# Patient Record
Sex: Female | Born: 1960 | Race: White | Hispanic: No | Marital: Married | Smoking: Never smoker
Health system: Southern US, Community
[De-identification: ages and names within clinical notes are randomized; demographics above are authoritative.]

## PROBLEM LIST (undated history)

## (undated) DIAGNOSIS — K219 Gastro-esophageal reflux disease without esophagitis: Secondary | ICD-10-CM

## (undated) DIAGNOSIS — J45909 Unspecified asthma, uncomplicated: Secondary | ICD-10-CM

## (undated) DIAGNOSIS — R112 Nausea with vomiting, unspecified: Secondary | ICD-10-CM

## (undated) DIAGNOSIS — Z9889 Other specified postprocedural states: Secondary | ICD-10-CM

## (undated) DIAGNOSIS — J40 Bronchitis, not specified as acute or chronic: Secondary | ICD-10-CM

## (undated) DIAGNOSIS — K589 Irritable bowel syndrome without diarrhea: Secondary | ICD-10-CM

## (undated) DIAGNOSIS — T7840XA Allergy, unspecified, initial encounter: Secondary | ICD-10-CM

## (undated) DIAGNOSIS — Z800 Family history of malignant neoplasm of digestive organs: Secondary | ICD-10-CM

## (undated) DIAGNOSIS — F419 Anxiety disorder, unspecified: Secondary | ICD-10-CM

## (undated) DIAGNOSIS — F411 Generalized anxiety disorder: Secondary | ICD-10-CM

## (undated) DIAGNOSIS — Z923 Personal history of irradiation: Secondary | ICD-10-CM

## (undated) DIAGNOSIS — Z9221 Personal history of antineoplastic chemotherapy: Secondary | ICD-10-CM

## (undated) DIAGNOSIS — I10 Essential (primary) hypertension: Secondary | ICD-10-CM

## (undated) DIAGNOSIS — C50919 Malignant neoplasm of unspecified site of unspecified female breast: Secondary | ICD-10-CM

## (undated) DIAGNOSIS — G4730 Sleep apnea, unspecified: Secondary | ICD-10-CM

## (undated) DIAGNOSIS — J302 Other seasonal allergic rhinitis: Secondary | ICD-10-CM

## (undated) HISTORY — PX: FRACTURE SURGERY: SHX138

## (undated) HISTORY — DX: Generalized anxiety disorder: F41.1

## (undated) HISTORY — DX: Essential (primary) hypertension: I10

## (undated) HISTORY — PX: BREAST LUMPECTOMY: SHX2

## (undated) HISTORY — DX: Allergy, unspecified, initial encounter: T78.40XA

## (undated) HISTORY — PX: ANKLE SURGERY: SHX546

## (undated) HISTORY — PX: BREAST CYST EXCISION: SHX579

## (undated) HISTORY — DX: Irritable bowel syndrome, unspecified: K58.9

## (undated) HISTORY — PX: BREAST EXCISIONAL BIOPSY: SUR124

## (undated) HISTORY — DX: Family history of malignant neoplasm of digestive organs: Z80.0

## (undated) HISTORY — PX: EYE SURGERY: SHX253

---
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
## 2009-08-19 HISTORY — PX: TOTAL ABDOMINAL HYSTERECTOMY: SHX209

## 2009-08-19 HISTORY — PX: ABDOMINAL HYSTERECTOMY: SHX81

## 2010-08-19 HISTORY — PX: CHOLECYSTECTOMY: SHX55

## 2012-10-07 HISTORY — PX: KNEE ARTHROSCOPY W/ MEDIAL COLLATERAL LIGAMENT (MCL) REPAIR: SHX1876

## 2012-10-12 ENCOUNTER — Encounter: Payer: Self-pay | Admitting: Emergency Medicine

## 2012-10-12 ENCOUNTER — Emergency Department
Admission: EM | Admit: 2012-10-12 | Discharge: 2012-10-12 | Disposition: A | Discharge status: Home / Self Care | Payer: BC Managed Care – PPO | Source: Home / Self Care | Attending: Family Medicine | Admitting: Family Medicine

## 2012-10-12 DIAGNOSIS — J069 Acute upper respiratory infection, unspecified: Secondary | ICD-10-CM

## 2012-10-12 HISTORY — DX: Bronchitis, not specified as acute or chronic: J40

## 2012-10-12 HISTORY — DX: Essential (primary) hypertension: I10

## 2012-10-12 HISTORY — DX: Other seasonal allergic rhinitis: J30.2

## 2012-10-12 HISTORY — DX: Anxiety disorder, unspecified: F41.9

## 2012-10-12 HISTORY — DX: Unspecified asthma, uncomplicated: J45.909

## 2012-10-12 MED ORDER — BENZONATATE 200 MG PO CAPS: 200.0000 mg | ORAL_CAPSULE | Freq: Every day | ORAL | Status: DC

## 2012-10-12 MED ORDER — DOXYCYCLINE HYCLATE 100 MG PO CAPS: 100.0000 mg | ORAL_CAPSULE | Freq: Two times a day (BID) | ORAL | Status: DC

## 2012-10-12 MED ORDER — ALBUTEROL SULFATE HFA 108 (90 BASE) MCG/ACT IN AERS: 2.0000 | INHALATION_SPRAY | RESPIRATORY_TRACT | Status: DC | PRN

## 2012-10-12 NOTE — ED Provider Notes (Signed)
History     CSN: [id number]  Arrival date & time 10/12/12  1642   First MD Initiated Contact with Patient 10/12/12 1655      Chief Complaint  Patient presents with  . Cough  . Nasal Congestion       HPI Comments: Patient complains onset of URI symptoms 2 days ago consisting of sneezing, sinus congestion, sore throat, headache, cough, and fatigue.  She developed sweats last night.  Her cough has been productive and worse at night.  She has had tightness in her anterior chest.  She tried her outdated albuterol inhaler which was not helpful.  She also uses Flonase for nasal allergies.  The history is provided by the patient.    Past Medical History  Diagnosis Date  . Hypertension   . Anxiety   . Seasonal allergies   . Asthma     with severe URIs  . Bronchitis     Past Surgical History  Procedure Laterality Date  . Knee arthroscopy w/ medial collateral ligament (mcl) repair Left 10-07-2012  . Tonsillectomy    . Abdominal hysterectomy    . Cholecystectomy      Family History  Problem Relation Age of Onset  . Hypertension Mother   . Diabetes Father   . Hypertension Father   . Hypertension Sister   . Hypertension Brother     History  Substance Use Topics  . Smoking status: Never Smoker   . Smokeless tobacco: Not on file  . Alcohol Use: Yes    OB History   Grav Para Term Preterm Abortions TAB SAB Ect Mult Living                  Review of Systems + sore throat + cough No pleuritic pain, but feels tightness in anterior chest No wheezing + nasal congestion + post-nasal drainage No sinus pain/pressure No itchy/red eyes No earache No hemoptysis + SOB No fever, + sweats + nausea No vomiting No abdominal pain No diarrhea No urinary symptoms No skin rashes + fatigue No myalgias + headache Used OTC meds without relief  Allergies  Erythromycin and Penicillins  Home Medications   Current Outpatient Rx  Name  Route  Sig  Dispense  Refill  .  DULoxetine (CYMBALTA) 30 MG capsule   Oral   Take 30 mg by mouth daily.         Marland Kitchen losartan (COZAAR) 100 MG tablet   Oral   Take 100 mg by mouth daily.         Marland Kitchen albuterol (PROVENTIL HFA;VENTOLIN HFA) 108 (90 BASE) MCG/ACT inhaler   Inhalation   Inhale 2 puffs into the lungs every 4 (four) hours as needed for wheezing.   1 Inhaler   0   . benzonatate (TESSALON) 200 MG capsule   Oral   Take 1 capsule (200 mg total) by mouth at bedtime.   12 capsule   0   . doxycycline (VIBRAMYCIN) 100 MG capsule   Oral   Take 1 capsule (100 mg total) by mouth 2 (two) times daily. (Rx void after 10/20/12)   20 capsule   0     BP 157/93  Pulse 107  Temp(Src) 98 F (36.7 C) (Oral)  Ht 5\' 7"  (1.702 m)  Wt 257 lb (116.574 kg)  BMI 40.24 kg/m2  SpO2 96%  Physical Exam Nursing notes and Vital Signs reviewed. Appearance:  Patient appears stated age, and in no acute distress.  Patient is obese (BMI 40.2)  Eyes:  Pupils are equal, round, and reactive to light and accomodation.  Extraocular movement is intact.  Conjunctivae are not inflamed  Ears:  Canals normal.  Tympanic membranes normal.  Nose:  Mildly congested turbinates.  No sinus tenderness.  Pharynx:  Normal Neck:  Supple.   Tender shotty anterior/posterior nodes are palpated bilaterally  Lungs:  Clear to auscultation.  Breath sounds are equal. Chest:   Mild tenderness to palpation over the mid-sternum.   Heart:  Regular rate and rhythm without murmurs, rubs, or gallops.  Abdomen:  Nontender without masses or hepatosplenomegaly.  Bowel sounds are present.  No CVA or flank tenderness.  Extremities:  No edema.  No calf tenderness Skin:  No rash present.    ED Course  Procedures  none      1. Acute upper respiratory infections of unspecified site; suspect early viral URI       MDM  There is no evidence of bacterial infection today.   Treat symptomatically for now:  Prescription written for Benzonatate [facility]) to take at  bedtime for night-time cough.  Continue abuterol inhaler. Take plain Mucinex (guaifenesin) twice daily for cough and congestion.  Increase fluid intake, rest. May use Afrin nasal spray (or generic oxymetazoline) twice daily for about 5 days.  Also recommend using saline nasal spray several times daily and saline nasal irrigation (AYR is a common brand).  Use Flonase spray after Afrin spray and saline irrigation Stop all antihistamines for now, and other non-prescription cough/cold preparations. May take Ibuprofen 200mg , 4 tabs every 8 hours with food for chest/sternum discomfort. Begin doxycycline if not improving about 5 days or if persistent fever develops (Given a prescription to hold, with an expiration date)  Follow-up with family doctor if not improving 7 to 10 days.        Lattie Haw, MD 10/12/12 [phone]

## 2012-10-12 NOTE — ED Notes (Signed)
Patient reports cough and congestion started 3 days ago; no known fever; some shortness of breath/wheezing which historically has with URIs. Did have Flu vaccination this season. Had laproscopic left knee surgery October 07, 2012.

## 2012-11-24 ENCOUNTER — Encounter: Payer: Self-pay | Admitting: *Deleted

## 2012-11-24 ENCOUNTER — Emergency Department
Admission: EM | Admit: 2012-11-24 | Discharge: 2012-11-24 | Disposition: A | Discharge status: Home / Self Care | Payer: BC Managed Care – PPO | Source: Home / Self Care | Attending: Family Medicine | Admitting: Family Medicine

## 2012-11-24 DIAGNOSIS — I10 Essential (primary) hypertension: Secondary | ICD-10-CM

## 2012-11-24 DIAGNOSIS — Z8619 Personal history of other infectious and parasitic diseases: Secondary | ICD-10-CM

## 2012-11-24 MED ORDER — HYDROCHLOROTHIAZIDE 12.5 MG PO TABS: 12.5000 mg | ORAL_TABLET | Freq: Every day | ORAL | Status: DC

## 2012-11-24 MED ORDER — VALACYCLOVIR HCL 1 G PO TABS: ORAL_TABLET | ORAL | Status: DC

## 2012-11-24 MED ORDER — LOSARTAN POTASSIUM 100 MG PO TABS: 100.0000 mg | ORAL_TABLET | Freq: Every day | ORAL | Status: DC

## 2012-11-24 NOTE — ED Notes (Signed)
Pt c/o high blood pressure x today. She reports a BP at work of 190/120. She c/o a little dizziness. Denies SOB, Nausea and CP.

## 2012-11-24 NOTE — ED Provider Notes (Signed)
History     CSN: [id number]  Arrival date & time 11/24/12  1703   First MD Initiated Contact with Patient 11/24/12 1730      Chief Complaint  Patient presents with  . Hypertension       HPI Comments: Patient states that her BP at work today was 190/120.  She has felt slightly dizzy, but feels well otherwise.  For the past 2 to 3 years her BP has been well controlled with losartan 100mg  daily.  Over the past 6 to 7 months her BP has not been quite controlled, and is usually about 140/90 to 95. She notes that she has had increased fluid retention over the past week or two. She states that she had a left knee meniscus repair on 10/06/12 and EKG was normal at that time.  She has had a negative ETT about 2 to 3 years ago.  Her cholesterol has been controlled, and her glucose has not been elevated.  She reports weight gain over the past 5 years. Family history:  Multiple members with CV disease:  Mother and father with hypertension; paternal grandmother stroke; maternal grandmother:  Carotid artery disease; mother CHF; maternal grandfather MI. She does not smoke or use alcohol  She also requests refill of Valtrex for occasional cold sores.   Past Medical History  Diagnosis Date  . Hypertension   . Anxiety   . Seasonal allergies   . Asthma     with severe URIs  . Bronchitis     Past Surgical History  Procedure Laterality Date  . Knee arthroscopy w/ medial collateral ligament (mcl) repair Left 10-07-2012  . Tonsillectomy    . Abdominal hysterectomy    . Cholecystectomy      Family History  Problem Relation Age of Onset  . Hypertension Mother   . Diabetes Father   . Hypertension Father   . Hypertension Sister   . Hypertension Brother     History  Substance Use Topics  . Smoking status: Never Smoker   . Smokeless tobacco: Not on file  . Alcohol Use: Yes    OB History   Grav Para Term Preterm Abortions TAB SAB Ect Mult Living                  Review of Systems   Constitutional: Negative.   Eyes: Negative.   Respiratory: Negative.   Cardiovascular: Positive for leg swelling. Negative for chest pain and palpitations.  Gastrointestinal: Negative.   Endocrine: Negative.   Genitourinary: Negative.   Musculoskeletal: Negative.   Skin: Negative.   Neurological: Positive for dizziness and headaches.    Allergies  Erythromycin and Penicillins  Home Medications   Current Outpatient Rx  Name  Route  Sig  Dispense  Refill  . albuterol (PROVENTIL HFA;VENTOLIN HFA) 108 (90 BASE) MCG/ACT inhaler   Inhalation   Inhale 2 puffs into the lungs every 4 (four) hours as needed for wheezing.   1 Inhaler   0   . benzonatate (TESSALON) 200 MG capsule   Oral   Take 1 capsule (200 mg total) by mouth at bedtime.   12 capsule   0   . doxycycline (VIBRAMYCIN) 100 MG capsule   Oral   Take 1 capsule (100 mg total) by mouth 2 (two) times daily. (Rx void after 10/20/12)   20 capsule   0   . DULoxetine (CYMBALTA) 30 MG capsule   Oral   Take 30 mg by mouth daily.         Marland Kitchen  hydrochlorothiazide (HYDRODIURIL) 12.5 MG tablet   Oral   Take 1 tablet (12.5 mg total) by mouth daily.   30 tablet   0   . losartan (COZAAR) 100 MG tablet   Oral   Take 1 tablet (100 mg total) by mouth daily.   30 tablet   0   . valACYclovir (VALTREX) 1000 MG tablet      Take two tabs by mouth every 12 hours for one day.  Take at onset of cold sore   4 tablet   3     BP 169/92  Pulse 96  Temp(Src) 98.6 F (37 C) (Oral)  Wt 263 lb (119.296 kg)  BMI 41.18 kg/m2  SpO2 96%  Physical Exam Nursing notes and Vital Signs reviewed. Appearance:  Patient appears stated age, and in no acute distress.  Patient is obese (BMI 41.2) Eyes:  Pupils are equal, round, and reactive to light and accomodation.  Extraocular movement is intact.  Conjunctivae are not inflamed.  Fundi benign  Ears:  Canals normal.  Tympanic membranes normal.  Nose:   Normal turbinates Pharynx:   Normal Neck:  Supple.   No adenopathy or thyromegaly Lungs:  Clear to auscultation.  Breath sounds are equal.  Heart:  Regular rate and rhythm without murmurs, rubs, or gallops.  Abdomen:  Nontender without masses or hepatosplenomegaly.  Bowel sounds are present.  No CVA or flank tenderness.  Extremities:  No edema.  No calf tenderness Skin:  No rash present.   ED Course  Procedures  none      1. Essential hypertension, benign, decreased control   2. History of cold sores       MDM  Add HCTZ 12.5mg  each morning.  Continue Losarten. Minimize salt and sodium containing foods.  Check blood pressure daily and record. Followup with Family Doctor in one week.  Given refill of Valtrex for cold sores.        Lattie Haw, MD 11/28/12 [phone]

## 2012-11-29 ENCOUNTER — Telehealth: Payer: Self-pay | Admitting: Emergency Medicine

## 2012-11-30 ENCOUNTER — Ambulatory Visit: Payer: BC Managed Care – PPO | Admitting: Physician Assistant

## 2012-12-01 ENCOUNTER — Ambulatory Visit (INDEPENDENT_AMBULATORY_CARE_PROVIDER_SITE_OTHER): Payer: BC Managed Care – PPO | Admitting: Family Medicine

## 2012-12-01 ENCOUNTER — Encounter: Payer: Self-pay | Admitting: Family Medicine

## 2012-12-01 VITALS — BP 126/78 | HR 97 | Ht 67.0 in | Wt 262.0 lb

## 2012-12-01 DIAGNOSIS — Z800 Family history of malignant neoplasm of digestive organs: Secondary | ICD-10-CM | POA: Insufficient documentation

## 2012-12-01 DIAGNOSIS — Z9221 Personal history of antineoplastic chemotherapy: Secondary | ICD-10-CM | POA: Insufficient documentation

## 2012-12-01 DIAGNOSIS — I10 Essential (primary) hypertension: Secondary | ICD-10-CM | POA: Insufficient documentation

## 2012-12-01 DIAGNOSIS — F411 Generalized anxiety disorder: Secondary | ICD-10-CM

## 2012-12-01 DIAGNOSIS — Z13220 Encounter for screening for lipoid disorders: Secondary | ICD-10-CM

## 2012-12-01 HISTORY — DX: Generalized anxiety disorder: F41.1

## 2012-12-01 HISTORY — DX: Essential (primary) hypertension: I10

## 2012-12-01 MED ORDER — HYDROCHLOROTHIAZIDE 12.5 MG PO TABS: 12.5000 mg | ORAL_TABLET | Freq: Every day | ORAL | Status: DC

## 2012-12-01 NOTE — Progress Notes (Signed)
CC: Debbie [name] is a 52 y.o. female is here for Establish Care and Hypertension   Subjective: HPI:  Pleasant 52 year old here to establish care  Last colonoscopy 01-03-2009 without abnormality, was told to repeat 3-5 years. Grandfather diagnosed with colon cancer age 6  Last mammogram 2012/01/04 without abnormality, Pap smear same time without abnormality.  Reports a history of hypertension: Has been on losartan for years without this adjustment. Noticed blood pressures in the stage II hypertension at work and then at urgent care a few weeks ago and started on hydrochlorothiazide. Since then she denies any new side effects were suspicion of intolerance. She reports taking HCTZ 12.5 mg a daily basis along with former regimen of losartan 100 mg daily without missed doses.  Reports a history of anxiety: Has been taking Cymbalta ever since 2006/01/03 when her father died. She reports satisfactory response ever since taking this, describes as helping taking edge off anxiety in life. States that anxiety and depression nor mental disturbance is interfering with quality of life currently. Reports she does have mild anxiety spells that are worsened by job cuts at work, getting only 5 hours of sleep most nights of the week due 2 hobbies late at night. Symptoms are improved with sleep and rest and hobbies.    Review Of Systems Outlined In HPI  Past Medical History  Diagnosis Date  . Hypertension   . Anxiety   . Seasonal allergies   . Asthma     with severe URIs  . Bronchitis   . Essential hypertension, benign 12/01/2012  . Generalized anxiety disorder 12/01/2012     Family History  Problem Relation Age of Onset  . Hypertension Mother   . Diabetes Father     maternal granndmother  . Hypertension Father   . Hypertension Sister   . Hypertension Brother   . Alcoholism Father   . Colon cancer Father   . Lung cancer Father   . Depression Father   . Hyperlipidemia Maternal Grandfather   . Stroke  Paternal Grandmother      History  Substance Use Topics  . Smoking status: Never Smoker   . Smokeless tobacco: Not on file  . Alcohol Use: Yes     Objective: Filed Vitals:   12/01/12 1514  BP: 126/78  Pulse: 97   Vital signs reviewed. General: Alert and Oriented, No Acute Distress HEENT: Pupils equal, round, reactive to light. Conjunctivae clear.  External ears unremarkable.  Moist mucous membranes. Lungs: Clear and comfortable work of breathing, speaking in full sentences without accessory muscle use. No wheezing rhonchi nor rales Cardiac: Regular rate and rhythm. No murmurs or gallops Neuro: CN II-XII grossly intact, gait normal. Extremities: No peripheral edema.  Strong peripheral pulses.  Abdomen obese and soft Mental Status: No depression, anxiety, nor agitation. Logical though process. Skin: Warm and dry.  Assessment & Plan: Debbie [name] was seen today for establish care and hypertension.  Diagnoses and associated orders for this visit:  Essential hypertension, benign - TSH - BASIC METABOLIC PANEL WITH GFR - hydrochlorothiazide (HYDRODIURIL) 12.5 MG tablet; Take 1 tablet (12.5 mg total) by mouth daily.  Generalized anxiety disorder - TSH  Screening, lipid - Lipid panel  Family history of colon cancer    Essential hypertension: Controlled, continue hydrochlorothiazide and losartan. Will rule out hyperthyroidism or renal dysfunction as secondary causes. Generalized anxiety disorder: Stable and control, continue Cymbalta. We'll rule out thyroid abnormality Patient is due for screening lipid panel, will have this checked prior to  complete physical exam.  Return in about 2 weeks (around 12/15/2012) for cpe.

## 2012-12-29 ENCOUNTER — Encounter: Payer: BC Managed Care – PPO | Admitting: Family Medicine

## 2012-12-29 LAB — LIPID PANEL
Cholesterol: 172 mg/dL (ref 0–200)
LDL Cholesterol: 89 mg/dL (ref 0–99)
VLDL: 25 mg/dL (ref 0–40)

## 2012-12-29 LAB — BASIC METABOLIC PANEL WITH GFR
GFR, Est African American: >89 mL/min
GFR, Est Non African American: >89 mL/min
Potassium: 3.5 mEq/L (ref 3.5–5.3)
Sodium: 141 mEq/L (ref 135–145)

## 2012-12-29 LAB — TSH: TSH: 1.987 u[IU]/mL (ref 0.350–4.500)

## 2013-01-04 ENCOUNTER — Ambulatory Visit (INDEPENDENT_AMBULATORY_CARE_PROVIDER_SITE_OTHER): Payer: BC Managed Care – PPO | Admitting: Family Medicine

## 2013-01-04 ENCOUNTER — Encounter: Payer: Self-pay | Admitting: Family Medicine

## 2013-01-04 VITALS — BP 159/92 | HR 91 | Ht 67.0 in | Wt 266.0 lb

## 2013-01-04 DIAGNOSIS — Z8619 Personal history of other infectious and parasitic diseases: Secondary | ICD-10-CM

## 2013-01-04 DIAGNOSIS — Z90710 Acquired absence of both cervix and uterus: Secondary | ICD-10-CM

## 2013-01-04 DIAGNOSIS — Z0000 Encounter for general adult medical examination without abnormal findings: Secondary | ICD-10-CM

## 2013-01-04 DIAGNOSIS — I10 Essential (primary) hypertension: Secondary | ICD-10-CM

## 2013-01-04 DIAGNOSIS — Z1211 Encounter for screening for malignant neoplasm of colon: Secondary | ICD-10-CM

## 2013-01-04 DIAGNOSIS — Z1239 Encounter for other screening for malignant neoplasm of breast: Secondary | ICD-10-CM

## 2013-01-04 HISTORY — DX: Personal history of other infectious and parasitic diseases: Z86.19

## 2013-01-04 HISTORY — DX: Acquired absence of both cervix and uterus: Z90.710

## 2013-01-04 NOTE — Patient Instructions (Signed)
Return in 1-2 weeks if blood pressure remains above 140/90.    Dr. Genelle Bal General Advice Following Your Complete Physical Exam  The Benefits of Regular Exercise: Unless you suffer from an uncontrolled cardiovascular condition, studies strongly suggest that regular exercise and physical activity will add to both the quality and length of your life.  The World Health Organization recommends 150 minutes of moderate intensity aerobic activity every week.  This is best split over 3-4 days a week, and can be as simple as a brisk walk for just over 35 minutes "most days of the week".  This type of exercise has been shown to lower LDL-Cholesterol, lower average blood sugars, lower blood pressure, lower cardiovascular disease risk, improve memory, and increase one's overall sense of wellbeing.  The addition of anaerobic (or "strength training") exercises offers additional benefits including but not limited to increased metabolism, prevention of osteoporosis, and improved overall cholesterol levels.  How Can I Strive For A Low-Fat Diet?: Current guidelines recommend that 25-35 percent of your daily energy (food) intake should come from fats.  One might ask how can this be achieved without having to dissect each meal on a daily basis?  Switch to skim or 1% milk instead of whole milk.  Focus on lean meats such as ground [place], fresh fish, baked chicken, and lean cuts of beef as your source of dietary protein.  Consume less than 300mg /day of dietary cholesterol.  Limit trans fatty acid consumption primarily by limiting synthetic trans fats such as partially hydrogenated oils (Ex: fried fast foods).  Focus efforts on reducing your intake of "solid" fats (Ex: Butter).  Substitute olive or vegetable oil for solid fats where possible.  Moderation of Salt Intake: Provided you don't carry a diagnosis of congestive heart failure nor renal failure, I recommend a daily allowance of no more than 2300 mg of salt  (sodium).  Keeping under this daily goal is associated with a decreased risk of cardiovascular events, creeping above it can lead to elevated blood pressures and increases your risk of cardiovascular events.  Milligrams (mg) of salt is listed on all nutrition labels, and your daily intake can add up faster than you think.  Most canned and frozen dinners can pack in over half your daily salt allowance in one meal.    Lifestyle Health Risks: Certain lifestyle choices carry specific health risks.  As you may already know, tobacco use has been associated with increasing one's risk of cardiovascular disease, pulmonary disease, numerous cancers, among many other issues.  What you may not know is that there are medications and nicotine replacement strategies that can more than double your chances of successfully quitting.  I would be thrilled to help manage your quitting strategy if you currently use tobacco products.  When it comes to alcohol use, I've yet to find an "ideal" daily allowance.  Provided an individual does not have a medical condition that is exacerbated by alcohol consumption, general guidelines determine "safe drinking" as no more than two standard drinks for a man or no more than one standard drink for a female per day.  However, much debate still exists on whether any amount of alcohol consumption is technically "safe".  My general advice, keep alcohol consumption to a minimum for general health promotion.  If you or others believe that alcohol, tobacco, or recreational drug use is interfering with your life, I would be happy to provide confidential counseling regarding treatment options.  General "Over The Counter" Nutrition Advice: Postmenopausal women  should aim for a daily calcium intake of 1200 mg, however a significant portion of this might already be provided by diets including milk, yogurt, cheese, and other dairy products.  Vitamin D has been shown to help preserve bone density, prevent  fatigue, and has even been shown to help reduce falls in the elderly.  Ensuring a daily intake of 800 Units of Vitamin D is a good place to start to enjoy the above benefits, we can easily check your Vitamin D level to see if you'd potentially benefit from supplementation beyond 800 Units a day.  Folic Acid intake should be of particular concern to women of childbearing age.  Daily consumption of 400-800 mcg of Folic Acid is recommended to minimize the chance of spinal cord defects in a fetus should pregnancy occur.    For many adults, accidents still remain one of the most common culprits when it comes to cause of death.  Some of the simplest but most effective preventitive habits you can adopt include regular seatbelt use, proper helmet use, securing firearms, and regularly testing your smoke and carbon monoxide detectors.  Aeliana Spates B. [facility] DO [facility] [address], [address] [place], [place] [address] Phone: [phone]

## 2013-01-04 NOTE — Progress Notes (Signed)
CC: Debbie [name] is a 52 y.o. female is here for Annual Exam   Subjective: HPI:  Colonoscopy: Last colonoscopy 2010 without abnormality, was told to repeat 3-5 years. Grandfather diagnosed with colon cancer age 71, she will arrange this with [facility] Papsmear:Last pap 2013 without abnormality, hx of benign hysterectomy, not indicated today Mammogram: Last mammogram 2013 without abnormality, she plans to arrange this with prior breast clinic in the near future   Influenza Vaccine: not indicated 01/04/2013 Pneumovax: not indicated Td/Tdap: 10/17/2101, UTD Zoster: (Start 52 yo)  Over the past 2 weeks have you been bothered by: - Little interest or pleasure in doing things: no - Feeling down depressed or hopeless: no  No formal exercise program, admits to eating poorly and stress eating. She tells me she is motivated to try to get significant weight loss off by nonsurgical means.  Review of Systems - General ROS: negative for - chills, fever, night sweats, weight gain or weight loss Ophthalmic ROS: negative for - decreased vision Psychological ROS: negative for - anxiety or depression ENT ROS: negative for - hearing change, nasal congestion, tinnitus or allergies Hematological and Lymphatic ROS: negative for - bleeding problems, bruising or swollen lymph nodes Breast ROS: negative Respiratory ROS: no cough, shortness of breath, or wheezing Cardiovascular ROS: no chest pain or dyspnea on exertion Gastrointestinal ROS: no abdominal pain, change in bowel habits, or black or bloody stools Genito-Urinary ROS: negative for - genital discharge, genital ulcers, incontinence or abnormal bleeding from genitals Musculoskeletal ROS: negative for - joint pain or muscle pain Neurological ROS: negative for - headaches or memory loss Dermatological ROS: negative for lumps, mole changes, rash and skin lesion changes  Past Medical History  Diagnosis Date  . Hypertension   . Anxiety   . Seasonal  allergies   . Asthma     with severe URIs  . Bronchitis   . Essential hypertension, benign 12/01/2012  . Generalized anxiety disorder 12/01/2012     Family History  Problem Relation Age of Onset  . Hypertension Mother   . Diabetes Father     maternal granndmother  . Hypertension Father   . Hypertension Sister   . Hypertension Brother   . Alcoholism Father   . Colon cancer Father   . Lung cancer Father   . Depression Father   . Hyperlipidemia Maternal Grandfather   . Stroke Paternal Grandmother      History  Substance Use Topics  . Smoking status: Never Smoker   . Smokeless tobacco: Not on file  . Alcohol Use: Yes     Objective: Filed Vitals:   01/04/13 1607  BP: 159/92  Pulse: 91    General: No Acute Distress HEENT: Atraumatic, normocephalic, conjunctivae normal without scleral icterus.  No nasal discharge, hearing grossly intact, TMs with good landmarks bilaterally with no middle ear abnormalities, posterior pharynx clear without oral lesions. Neck: Supple, trachea midline, no cervical nor supraclavicular adenopathy. Pulmonary: Clear to auscultation bilaterally without wheezing, rhonchi, nor rales. Cardiac: Regular rate and rhythm.  No murmurs, rubs, nor gallops. No peripheral edema.  2+ peripheral pulses bilaterally. Abdomen: Obese Bowel sounds normal.  No masses.  Non-tender without rebound.  Negative Murphy's sign. MSK: Grossly intact, no signs of weakness.  Full strength throughout upper and lower extremities.  Full ROM in upper and lower extremities.  No midline spinal tenderness. Neuro: Gait unremarkable, CN II-XII grossly intact.  C5-C6 Reflex 2/4 Bilaterally, L4 Reflex 2/4 Bilaterally.  Cerebellar function intact. Skin: No  rashes. Nor suspicious pigmented lesions Psych: Alert and oriented to person/place/time.  Thought process normal. No anxiety/depression.   Assessment & Plan: Evonne was seen today for annual exam.  Diagnoses and associated orders for this  visit:  Annual physical exam  Essential hypertension, benign  Colon cancer screening  Screening for malignant neoplasm of breast  History of hysterectomy  History of shingles  Morbid obesity - Amb ref to Medical Nutrition Therapy-MNT    Essential hypertension: Controlled, she reports not taking blood pressure medication of the weekend. She will check blood pressures at home and return in one week if above 140/90 and has restarted daily blood pressure medication Due for colon cancer screening, she will arrange herself Due for mammogram, she will arrange herself Healthy lifestyle interventions including but limited to regular exercise, a healthy low fat diet, moderation of salt intake, the dangers of tobacco/alcohol/recreational drug use, nutrition supplementation, and accident avoidance were discussed with the patient and a handout was provided for future reference. Referral to Dr. Cathey Endow for nonsurgical bariatric management  Return in about 3 months (around 04/06/2013).

## 2013-01-28 ENCOUNTER — Other Ambulatory Visit: Payer: Self-pay | Admitting: *Deleted

## 2013-01-28 DIAGNOSIS — I10 Essential (primary) hypertension: Secondary | ICD-10-CM

## 2013-01-28 MED ORDER — HYDROCHLOROTHIAZIDE 12.5 MG PO TABS: 12.5000 mg | ORAL_TABLET | Freq: Every day | ORAL | Status: DC

## 2013-02-08 ENCOUNTER — Ambulatory Visit (INDEPENDENT_AMBULATORY_CARE_PROVIDER_SITE_OTHER): Payer: BC Managed Care – PPO | Admitting: Family Medicine

## 2013-02-08 ENCOUNTER — Encounter: Payer: Self-pay | Admitting: Family Medicine

## 2013-02-08 VITALS — BP 136/88 | HR 105 | Resp 16 | Wt 264.0 lb

## 2013-02-08 DIAGNOSIS — G4700 Insomnia, unspecified: Secondary | ICD-10-CM

## 2013-02-08 DIAGNOSIS — I10 Essential (primary) hypertension: Secondary | ICD-10-CM

## 2013-02-08 DIAGNOSIS — F411 Generalized anxiety disorder: Secondary | ICD-10-CM

## 2013-02-08 MED ORDER — ZOLPIDEM TARTRATE 5 MG PO TABS: ORAL_TABLET | ORAL | Status: DC

## 2013-02-08 NOTE — Progress Notes (Signed)
CC: Debbie [name] is a 52 y.o. female is here for Medication Problem   Subjective: HPI:  Emergency room followup  Thursday of last week she was feeling dizzy at work blood pressure response reportedly over 200/110. Medication changes include stopping Cymbalta cold [place] 2-3 days prior to this episode. She presented to [facility] emergency room complete metabolic panel is normal, CBC unremarkable, d-dimer negative, cardiac biomarkers negative, CT scan of the head revealed only right maxillary sinusitis, EKG was reportedly normal.  She was discharged that day with as needed Ativan she was using this once a day until Saturday when she restarted Cymbalta, came through mail order. During and just prior the above episodes she was reporting a sensation of hyperventilation, restlessness, constant worrying.  She denies changes in her job or relationship stressors.  Since restarting Cymbalta she reports drastic improvement of all the above symptoms.  She currently denies dizziness, headache, motor or sensory disturbances, chest pain, shortness of breath, peripheral edema, fevers, chills, mental disturbance, depression, anxiety  She has been taking losartan and hydrochlorothiazide on a daily basis without missed doses. No outside blood pressures to report other than the above  She expresses frustration with inability to sleep every night of the week. Describes it as moderate to severe trouble falling asleep without difficulty staying asleep. Symptoms were worsened during the above episode but even prior bothering her on a daily basis and her current quality of life she sleeps 5-7 hours every night. She lies in bed at 10 or 11:00 tired but will not fall sleep until 2 or 3 hours later. No help from melatonin or over-the-counter sleep aids.   Review Of Systems Outlined In HPI  Past Medical History  Diagnosis Date  . Hypertension   . Anxiety   . Seasonal allergies   . Asthma     with severe  URIs  . Bronchitis   . Essential hypertension, benign 12/01/2012  . Generalized anxiety disorder 12/01/2012     Family History  Problem Relation Age of Onset  . Hypertension Mother   . Diabetes Father     maternal granndmother  . Hypertension Father   . Hypertension Sister   . Hypertension Brother   . Alcoholism Father   . Colon cancer Father   . Lung cancer Father   . Depression Father   . Hyperlipidemia Maternal Grandfather   . Stroke Paternal Grandmother      History  Substance Use Topics  . Smoking status: Never Smoker   . Smokeless tobacco: Not on file  . Alcohol Use: Yes     Objective: Filed Vitals:   02/08/13 1119  BP: 136/88  Pulse: 105  Resp: 16    General: Alert and Oriented, No Acute Distress HEENT: Pupils equal, round, reactive to light. Conjunctivae clear.  Moist mucous membranes pharynx unremarkable Lungs: Clear to auscultation bilaterally, no wheezing/ronchi/rales.  Comfortable work of breathing. Good air movement. Cardiac: Regular rate and rhythm. Normal S1/S2.  No murmurs, rubs, nor gallops.   Abdomen: Obese soft nontender Extremities: No peripheral edema.  Strong peripheral pulses.  Mental Status: No depression, anxiety, nor agitation. Logical thought process, well-dressed Skin: Warm and dry.  Assessment & Plan: [name] was seen today for medication problem.  Diagnoses and associated orders for this visit:  Essential hypertension, benign  Generalized anxiety disorder  Insomnia - zolpidem (AMBIEN) 5 MG tablet; One to two tablets at bedtime as needed for sleep.    Essential hypertension: Controlled chronic condition, continue losartan hydrochlorothiazide  Generalized anxiety disorder: Now controlled back on Cymbalta discussed that her constellation of symptoms were almost certainly caused by immediate unintentional stop of Cymbalta Insomnia: Chronic uncontrolled condition start Ambien she has had lack of side effects been using this years ago  for jet lag  Return in about 3 months (around 05/11/2013).

## 2013-02-17 ENCOUNTER — Ambulatory Visit (INDEPENDENT_AMBULATORY_CARE_PROVIDER_SITE_OTHER): Payer: BC Managed Care – PPO | Admitting: Family Medicine

## 2013-02-17 ENCOUNTER — Encounter: Payer: Self-pay | Admitting: Family Medicine

## 2013-02-17 VITALS — BP 124/68 | HR 90 | Wt 266.0 lb

## 2013-02-17 DIAGNOSIS — K2970 Gastritis, unspecified, without bleeding: Secondary | ICD-10-CM

## 2013-02-17 HISTORY — DX: Gastritis, unspecified, without bleeding: K29.70

## 2013-02-17 MED ORDER — OMEPRAZOLE 40 MG PO CPDR: 40.0000 mg | DELAYED_RELEASE_CAPSULE | Freq: Every day | ORAL | Status: DC

## 2013-02-17 NOTE — Progress Notes (Signed)
CC: Debbie [name] is a 52 y.o. female is here for stomach issues   Subjective: HPI:  Patient complains of moderate epigastric discomfort that began 2 days ago that radiates slightly into her back that came on abruptly was improved with eating nothing made it worse was significantly improved and resolved with taking 6 generic Tums. He has not returned. She had similar pain prior to her gallbladder being taken out. Discomfort was described only as burning. Before after and during episodes she denies nausea, vomiting, right upper quadrant pain, diarrhea, constipation, exertional discomfort, jaundice. She reports she has been taking ibuprofen on a daily basis for one week up until the time of her discomfort for a root canal   Review Of Systems Outlined In HPI  Past Medical History  Diagnosis Date  . Hypertension   . Anxiety   . Seasonal allergies   . Asthma     with severe URIs  . Bronchitis   . Essential hypertension, benign 12/01/2012  . Generalized anxiety disorder 12/01/2012     Family History  Problem Relation Age of Onset  . Hypertension Mother   . Diabetes Father     maternal granndmother  . Hypertension Father   . Hypertension Sister   . Hypertension Brother   . Alcoholism Father   . Colon cancer Father   . Lung cancer Father   . Depression Father   . Hyperlipidemia Maternal Grandfather   . Stroke Paternal Grandmother      History  Substance Use Topics  . Smoking status: Never Smoker   . Smokeless tobacco: Not on file  . Alcohol Use: Yes     Objective: Filed Vitals:   02/17/13 1623  BP: 124/68  Pulse: 90    Vital signs reviewed. General: Alert and Oriented, No Acute Distress HEENT: Pupils equal, round, reactive to light. Conjunctivae clear.  External ears unremarkable.  Moist mucous membranes. Lungs: Clear and comfortable work of breathing, speaking in full sentences without accessory muscle use. Cardiac: Regular rate and rhythm.  Abdomen: Obese soft  nontender no rebound no palpable masses mild epigastric discomfort with deep palpation Extremities: No peripheral edema.  Strong peripheral pulses.  Mental Status: No depression, anxiety, nor agitation. Logical though process. Skin: Warm and dry.  Assessment & Plan: Debbie [name] was seen today for stomach issues.  Diagnoses and associated orders for this visit:  Gastritis  Other Orders - omeprazole (PRILOSEC) 40 MG capsule; Take 1 capsule (40 mg total) by mouth daily.    Discussed with patient that her epigastric pain was most likely due to gastritis and possibly GERD both of which should improve with acid suppression therapy. Discussed H2 blockers versus PPI and she has Prilosec at home that she would prefer to use I encouraged her to use this for 2-4 weeks and then stop for a trial off. Avoid nonsteroidal anti-inflammatory if possible  Return in about 3 months (around 05/20/2013).

## 2013-03-05 ENCOUNTER — Encounter: Payer: Self-pay | Admitting: Family Medicine

## 2013-03-05 DIAGNOSIS — Z6830 Body mass index (BMI) 30.0-30.9, adult: Secondary | ICD-10-CM

## 2013-03-05 DIAGNOSIS — E6609 Other obesity due to excess calories: Secondary | ICD-10-CM

## 2013-03-05 HISTORY — DX: Other obesity due to excess calories: E66.09

## 2013-05-11 ENCOUNTER — Ambulatory Visit (INDEPENDENT_AMBULATORY_CARE_PROVIDER_SITE_OTHER): Payer: BC Managed Care – PPO | Admitting: Family Medicine

## 2013-05-11 ENCOUNTER — Encounter: Payer: Self-pay | Admitting: Family Medicine

## 2013-05-11 VITALS — BP 147/82 | HR 92 | Wt 222.0 lb

## 2013-05-11 DIAGNOSIS — B009 Herpesviral infection, unspecified: Secondary | ICD-10-CM

## 2013-05-11 DIAGNOSIS — F411 Generalized anxiety disorder: Secondary | ICD-10-CM

## 2013-05-11 DIAGNOSIS — I10 Essential (primary) hypertension: Secondary | ICD-10-CM

## 2013-05-11 DIAGNOSIS — R7989 Other specified abnormal findings of blood chemistry: Secondary | ICD-10-CM

## 2013-05-11 HISTORY — DX: Other specified abnormal findings of blood chemistry: R79.89

## 2013-05-11 MED ORDER — VALACYCLOVIR HCL 1 G PO TABS: ORAL_TABLET | ORAL | Status: DC

## 2013-05-11 NOTE — Progress Notes (Signed)
CC: Debbie [name] is a 52 y.o. female is here for Hypertension   Subjective: HPI:  Followup essential hypertension: Her bariatric specialist has stopped hydrochlorothiazide. Since that time blood pressures outside her office has been consistently below 140/90. She denies headaches, motor sensory disturbances, chest pain, shortness of breath, orthopnea nor peripheral edema  Generalized anxiety disorder: She continues on Cymbalta on a daily basis without missed doses she denies any anxiety or depression in her life right now she is quite happy with her current status  Patient reports her bariatric specialist has recently found elevated LFTs. ALT is 2 times greater than AST and ALT situations, this was not noticed until July of this summer prior to that all values had been normal. She has occasional acetaminophen intake rare alcohol intake no family history of liver disease other than father with hepatitis C due to blood transfusion. She denies right upper quadrant pain skin or scleral icterus nor nausea or vomiting   Review Of Systems Outlined In HPI  Past Medical History  Diagnosis Date  . Hypertension   . Anxiety   . Seasonal allergies   . Asthma     with severe URIs  . Bronchitis   . Essential hypertension, benign 12/01/2012  . Generalized anxiety disorder 12/01/2012     Family History  Problem Relation Age of Onset  . Hypertension Mother   . Diabetes Father     maternal granndmother  . Hypertension Father   . Hypertension Sister   . Hypertension Brother   . Alcoholism Father   . Colon cancer Father   . Lung cancer Father   . Depression Father   . Hyperlipidemia Maternal Grandfather   . Stroke Paternal Grandmother      History  Substance Use Topics  . Smoking status: Never Smoker   . Smokeless tobacco: Not on file  . Alcohol Use: Yes     Objective: Filed Vitals:   05/11/13 1611  BP: 147/82  Pulse: 92    General: Alert and Oriented, No Acute  Distress HEENT: Pupils equal, round, reactive to light. Conjunctivae clear.  Moist mucous membranes pharynx unremarkable Lungs: Clear to auscultation bilaterally, no wheezing/ronchi/rales.  Comfortable work of breathing. Good air movement. Cardiac: Regular rate and rhythm. Normal S1/S2.  No murmurs, rubs, nor gallops.   Abdomen: Normal bowel sounds, soft and non tender without palpable masses. Extremities: No peripheral edema.  Strong peripheral pulses.  Mental Status: No depression, anxiety, nor agitation. Skin: Warm and dry.  Assessment & Plan: Jullian was seen today for hypertension.  Diagnoses and associated orders for this visit:  Essential hypertension, benign  Generalized anxiety disorder  Elevated LFTs - Iron and TIBC - Hepatitis panel, acute - Ferritin, Serum (Serial) - Ferritin  HSV infection - valACYclovir (VALTREX) 1000 MG tablet; Take two tabs by mouth every 12 hours for one day.  Take at onset of cold sore    Essential hypertension: Manual Recheck today shows stage I hypertension systolic. She'll be following up with her bariatric specialist twice a week for the rest of the fall, she agrees to return of blood pressure again is consistently above 140/90 at that point we will restart hydrochlorothiazide Generalized anxiety disorder: Controlled continue Cymbalta Elevated LFTs: Patient has ultrasound of the liver scheduled for Monday we will screen for viral hepatitis etiology and screen for hemachromatosis with the above labs Refill valcyclovir for as needed outbreaks HSV  Return in about 3 months (around 08/10/2013).

## 2013-05-12 LAB — FERRITIN: Ferritin: 72 ng/mL (ref 10–291)

## 2013-05-12 LAB — IRON AND TIBC
TIBC: 254 ug/dL (ref 250–470)
UIBC: 208 ug/dL (ref 125–400)

## 2013-05-13 ENCOUNTER — Encounter: Payer: Self-pay | Admitting: Family Medicine

## 2013-06-24 ENCOUNTER — Other Ambulatory Visit: Payer: Self-pay

## 2014-01-06 LAB — HM MAMMOGRAPHY

## 2014-02-22 ENCOUNTER — Other Ambulatory Visit: Payer: Self-pay

## 2014-02-22 MED ORDER — DULOXETINE HCL 30 MG PO CPEP: 30.0000 mg | ORAL_CAPSULE | Freq: Every day | ORAL | Status: DC

## 2014-02-25 ENCOUNTER — Ambulatory Visit (INDEPENDENT_AMBULATORY_CARE_PROVIDER_SITE_OTHER): Payer: BC Managed Care – PPO | Admitting: Family Medicine

## 2014-02-25 ENCOUNTER — Encounter: Payer: Self-pay | Admitting: Family Medicine

## 2014-02-25 VITALS — BP 139/81 | HR 87 | Ht 65.25 in | Wt 193.0 lb

## 2014-02-25 DIAGNOSIS — B001 Herpesviral vesicular dermatitis: Secondary | ICD-10-CM

## 2014-02-25 DIAGNOSIS — R945 Abnormal results of liver function studies: Secondary | ICD-10-CM

## 2014-02-25 DIAGNOSIS — I10 Essential (primary) hypertension: Secondary | ICD-10-CM

## 2014-02-25 DIAGNOSIS — J4520 Mild intermittent asthma, uncomplicated: Secondary | ICD-10-CM

## 2014-02-25 DIAGNOSIS — F411 Generalized anxiety disorder: Secondary | ICD-10-CM

## 2014-02-25 DIAGNOSIS — B009 Herpesviral infection, unspecified: Secondary | ICD-10-CM

## 2014-02-25 DIAGNOSIS — J45909 Unspecified asthma, uncomplicated: Secondary | ICD-10-CM

## 2014-02-25 DIAGNOSIS — G4700 Insomnia, unspecified: Secondary | ICD-10-CM

## 2014-02-25 DIAGNOSIS — R7989 Other specified abnormal findings of blood chemistry: Secondary | ICD-10-CM

## 2014-02-25 MED ORDER — ALBUTEROL SULFATE HFA 108 (90 BASE) MCG/ACT IN AERS: 2.0000 | INHALATION_SPRAY | RESPIRATORY_TRACT | Status: DC | PRN

## 2014-02-25 MED ORDER — ZOLPIDEM TARTRATE 5 MG PO TABS: 5.0000 mg | ORAL_TABLET | Freq: Every evening | ORAL | Status: DC | PRN

## 2014-02-25 MED ORDER — VALACYCLOVIR HCL 500 MG PO TABS: ORAL_TABLET | ORAL | Status: DC

## 2014-02-25 MED ORDER — DULOXETINE HCL 30 MG PO CPEP: 30.0000 mg | ORAL_CAPSULE | Freq: Every day | ORAL | Status: DC

## 2014-02-25 NOTE — Progress Notes (Signed)
CC: Debbie [name] is a 53 y.o. female is here for Hypertension   Subjective: HPI:  Followup essential hypertension: Continues to take Cozaar on a daily basis without known intolerances. Renal function was checked most recently in Spring through the [place] system and normal. She works out about an hour 6 days a week, she's been having continued success with weight loss focusing on exercise and portion control. Denies chest pain shortness of breath orthopnea nor peripheral edema  Generalized anxiety disorder: Symptoms of nervousness and difficult sleeping have returned after she stopped taking Cymbalta which she ran out of it 10 days ago. Prior to running out of medication she states that she was completely anxiety and free of any other mental disturbance. Symptoms are mild to moderate in severity currently.  She at one time took Ambien for difficulty falling asleep and wants to know she can have her refill until Cymbalta takes full effect again. Describes difficulty sleeping as racing thoughts of what ever she had to deal with at work during that day but no reoccurring thoughts or paranoia.  She's requesting a refill of albuterol that she uses during the fall due to reactive airway disease. Currently she denies any shortness of breath wheezing or cough  Requesting refills on projects that she uses for cold sores. Currently she does not have any lesions that she's concerned about. Symptoms will resolve within one or 2 days if she takes it within the first 24 hours of onset.  Since I saw her last liver biopsy for elevated LFTs which did not show steatosis or any signs of autoimmune disease, essential biopsy was normal.     Review Of Systems Outlined In HPI  Past Medical History  Diagnosis Date  . Hypertension   . Anxiety   . Seasonal allergies   . Asthma     with severe URIs  . Bronchitis   . Essential hypertension, benign 12/01/2012  . Generalized anxiety disorder 12/01/2012    Past  Surgical History  Procedure Laterality Date  . Knee arthroscopy w/ medial collateral ligament (mcl) repair Left 10-07-2012  . Tonsillectomy  1969  . Abdominal hysterectomy  2011  . Cholecystectomy  2012  . Ankle surgery     Family History  Problem Relation Age of Onset  . Hypertension Mother   . Diabetes Father     maternal granndmother  . Hypertension Father   . Hypertension Sister   . Hypertension Brother   . Alcoholism Father   . Colon cancer Father   . Lung cancer Father   . Depression Father   . Hyperlipidemia Maternal Grandfather   . Stroke Paternal Grandmother     History   Social History  . Marital Status: Married    Spouse Name: N/A    Number of Children: N/A  . Years of Education: N/A   Occupational History  . Not on file.   Social History Main Topics  . Smoking status: Never Smoker   . Smokeless tobacco: Not on file  . Alcohol Use: Yes  . Drug Use: No  . Sexual Activity: Not Currently   Other Topics Concern  . Not on file   Social History Narrative  . No narrative on file     Objective: BP 139/81  Pulse 87  Ht 5' 5.25" (1.657 m)  Wt 193 lb (87.544 kg)  BMI 31.88 kg/m2  General: Alert and Oriented, No Acute Distress HEENT: Pupils equal, round, reactive to light. Conjunctivae clear.  Moist mucous membranes  pharynx unremarkable Lungs: Clear to auscultation bilaterally, no wheezing/ronchi/rales.  Comfortable work of breathing. Good air movement. Cardiac: Regular rate and rhythm. Normal S1/S2.  No murmurs, rubs, nor gallops.   Abdomen: Obese soft nontender Extremities: No peripheral edema.  Strong peripheral pulses.  Mental Status: No depression, anxiety, nor agitation. Skin: Warm and dry.  Assessment & Plan: Debbie [name] was seen today for hypertension.  Diagnoses and associated orders for this visit:  Essential hypertension, benign  Generalized anxiety disorder  Insomnia - zolpidem (AMBIEN) 5 MG tablet; Take 1 tablet (5 mg total) by mouth at  bedtime as needed for sleep.  Reactive airway disease, mild intermittent, uncomplicated - albuterol (PROVENTIL HFA;VENTOLIN HFA) 108 (90 BASE) MCG/ACT inhaler; Inhale 2 puffs into the lungs every 4 (four) hours as needed for wheezing.  Cold sore - valACYclovir (VALTREX) 500 MG tablet; One by mouth twice a day for 1-2 days at onset of cold sore.  Elevated LFTs  Other Orders - DULoxetine (CYMBALTA) 30 MG capsule; Take 1 capsule (30 mg total) by mouth daily.    Essential hypertension: Controlled continue success with weight loss and continue Cozaar Generalized anxiety disorder: Uncontrolled due to stopping Cymbalta restart former regimen and return in 1-2 weeks after starting if symptoms are persistent Insomnia: Most likely due to stopping Cymbalta, restart Ambien pending return of Cymbalta effectiveness Cold sore: Continue as needed Valtrex Reactive airway disease: Controlled continue as needed albuterol Elevated of her function test: No further intervention unless she develops further quadrant pain, fevers, and/or jaundice/icterus  Return in about 6 months (around 08/28/2014).

## 2014-06-10 ENCOUNTER — Telehealth: Payer: Self-pay | Admitting: Family Medicine

## 2014-06-10 MED ORDER — LOSARTAN POTASSIUM 100 MG PO TABS: 100.0000 mg | ORAL_TABLET | Freq: Every day | ORAL | Status: DC

## 2014-06-10 NOTE — Telephone Encounter (Signed)
[facility]

## 2014-08-29 ENCOUNTER — Encounter: Payer: Self-pay | Admitting: Family Medicine

## 2014-08-29 ENCOUNTER — Ambulatory Visit (INDEPENDENT_AMBULATORY_CARE_PROVIDER_SITE_OTHER): Payer: BLUE CROSS/BLUE SHIELD | Admitting: Family Medicine

## 2014-08-29 VITALS — BP 145/87 | HR 85 | Wt 207.0 lb

## 2014-08-29 DIAGNOSIS — I10 Essential (primary) hypertension: Secondary | ICD-10-CM

## 2014-08-29 DIAGNOSIS — Z800 Family history of malignant neoplasm of digestive organs: Secondary | ICD-10-CM

## 2014-08-29 DIAGNOSIS — S43431A Superior glenoid labrum lesion of right shoulder, initial encounter: Secondary | ICD-10-CM | POA: Diagnosis not present

## 2014-08-29 DIAGNOSIS — S43439A Superior glenoid labrum lesion of unspecified shoulder, initial encounter: Secondary | ICD-10-CM

## 2014-08-29 DIAGNOSIS — F411 Generalized anxiety disorder: Secondary | ICD-10-CM | POA: Diagnosis not present

## 2014-08-29 HISTORY — DX: Superior glenoid labrum lesion of unspecified shoulder, initial encounter: S43.439A

## 2014-08-29 MED ORDER — LOSARTAN POTASSIUM 100 MG PO TABS: 100.0000 mg | ORAL_TABLET | Freq: Every day | ORAL | Status: DC

## 2014-08-29 MED ORDER — DULOXETINE HCL 20 MG PO CPEP: 20.0000 mg | ORAL_CAPSULE | Freq: Every day | ORAL | Status: DC

## 2014-08-29 NOTE — Progress Notes (Signed)
CC: Debbie [name] is a 54 y.o. female is here for Hypertension   Subjective: HPI:  Follow-up essential hypertension: Continues on losartan on a daily basis. No outside blood pressures to report however on chart review through Dr. Cruzita Lederer office pressures have been ranging from low stage I to prehypertensive state. Denies chest pain shortness of breath orthopnea nor peripheral edema. 6 days ago she restarted an exercise routine that she had stopped for at least 2 months.  Follow-up family history colon cancer: She forgot to schedule a repeat colonoscopy in 2015.  Follow-up generalized anxiety disorder: She wants to know if she can try to get off citalopram. The last time she did this she stopped cold [place] when she ran out of medication and had worsening anxiety. She states right now that anxiety is well controlled and has no other mental disturbance.  She complains of right shoulder pain localized in the posterior aspect of the shoulder that is nonradiating. It is worse with lying on the right shoulder with her arm abducted.  It occurred after a new weight lifting regimen that she has now abandoned. It is worse doing curls of any weight. She denies any motor or sensory disturbances in the upper extremity other than that above. She denies any swelling redness or warmth of the joint     Review Of Systems Outlined In HPI  Past Medical History  Diagnosis Date  . Hypertension   . Anxiety   . Seasonal allergies   . Asthma     with severe URIs  . Bronchitis   . Essential hypertension, benign 12/01/2012  . Generalized anxiety disorder 12/01/2012    Past Surgical History  Procedure Laterality Date  . Knee arthroscopy w/ medial collateral ligament (mcl) repair Left 10-07-2012  . Tonsillectomy  1969  . Abdominal hysterectomy  2011  . Cholecystectomy  2012  . Ankle surgery     Family History  Problem Relation Age of Onset  . Hypertension Mother   . Diabetes Father     maternal  granndmother  . Hypertension Father   . Hypertension Sister   . Hypertension Brother   . Alcoholism Father   . Colon cancer Father   . Lung cancer Father   . Depression Father   . Hyperlipidemia Maternal Grandfather   . Stroke Paternal Grandmother     History   Social History  . Marital Status: Married    Spouse Name: N/A    Number of Children: N/A  . Years of Education: N/A   Occupational History  . Not on file.   Social History Main Topics  . Smoking status: Never Smoker   . Smokeless tobacco: Not on file  . Alcohol Use: Yes  . Drug Use: No  . Sexual Activity: Not Currently   Other Topics Concern  . Not on file   Social History Narrative     Objective: BP 145/87 mmHg  Pulse 85  Wt 207 lb (93.895 kg)  General: Alert and Oriented, No Acute Distress HEENT: Pupils equal, round, reactive to light. Conjunctivae clear.  Moist mucous membranes pharynx unremarkable Lungs: clearing comfortable work of breathing Cardiac: Regular rate and rhythm.  Right shoulder exam reveals full range of motion and strength in all planes of motion and with individual rotator cuff testing. No overlying redness warmth or swelling.  Neer's test negative.  Hawkins test negative. Empty can negative. Crossarm test positive. O'Brien's test positive. Apprehension test negative. Speed's test negative. Extremities: No peripheral edema.  Strong  peripheral pulses.  Mental Status: No depression, anxiety, nor agitation. Skin: Warm and dry.  Assessment & Plan: Debbie [name] was seen today for hypertension.  Diagnoses and associated orders for this visit:  Essential hypertension, benign - losartan (COZAAR) 100 MG tablet; Take 1 tablet (100 mg total) by mouth daily.  Family history of colon cancer  Generalized anxiety disorder - DULoxetine (CYMBALTA) 20 MG capsule; Take 1 capsule (20 mg total) by mouth daily.  Labral tear of shoulder, right, initial encounter    Essential hypertension: Uncontrolled  chronic condition, continue losartan she and I are both optimistic that she'll be able to stick with her daily exercise routine to help her lose weight and also reduce her blood pressure therefore no interventions unless blood pressures remain above 140/90 outside of our office for the next month. Family history colon cancer: I have asked her if she needs any help with arranging a repeat colonoscopy she politely declines and states that she already has the contact info for her gastroenterologist and plans on calling them shortly. Generalized anxiety disorder: Controlled discussed tapering off of Cymbalta beginning at 20 mg in going from there based on symptoms Discussed with her my suspicion of a labral tear of the right shoulder rotator cuff tendinitis, she was given a home rehabilitation exercise plan to be done on a daily basis for the next month. If no better as we get into February follow-up with a sports medicine consult for second opinion.  Return in about 3 months (around 11/28/2014) for Blood Pressure Follow Up.

## 2014-10-12 ENCOUNTER — Other Ambulatory Visit: Payer: Self-pay

## 2014-10-12 DIAGNOSIS — F411 Generalized anxiety disorder: Secondary | ICD-10-CM

## 2014-10-12 MED ORDER — DULOXETINE HCL 20 MG PO CPEP: 20.0000 mg | ORAL_CAPSULE | Freq: Every day | ORAL | Status: DC

## 2014-11-28 ENCOUNTER — Ambulatory Visit (INDEPENDENT_AMBULATORY_CARE_PROVIDER_SITE_OTHER): Payer: BLUE CROSS/BLUE SHIELD | Admitting: Family Medicine

## 2014-11-28 ENCOUNTER — Encounter: Payer: Self-pay | Admitting: Family Medicine

## 2014-11-28 VITALS — BP 140/90 | HR 81 | Wt 206.0 lb

## 2014-11-28 DIAGNOSIS — F411 Generalized anxiety disorder: Secondary | ICD-10-CM

## 2014-11-28 DIAGNOSIS — S43431A Superior glenoid labrum lesion of right shoulder, initial encounter: Secondary | ICD-10-CM | POA: Diagnosis not present

## 2014-11-28 DIAGNOSIS — I10 Essential (primary) hypertension: Secondary | ICD-10-CM | POA: Diagnosis not present

## 2014-11-28 DIAGNOSIS — L309 Dermatitis, unspecified: Secondary | ICD-10-CM

## 2014-11-28 MED ORDER — DESONIDE 0.05 % EX CREA: TOPICAL_CREAM | Freq: Two times a day (BID) | CUTANEOUS | Status: DC

## 2014-11-28 NOTE — Progress Notes (Signed)
CC: Debbie [name] is a 54 y.o. female is here for Hypertension   Subjective: HPI:  Follow-up essential hypertension: Continues to take losartan 100 mg on a daily basis. Blood pressures outside of our office have been in the prehypertensive range. She denies any known side effects. No formal exercise routine but has started saxenda 5 weeks ago for weight loss. She is not sure if it's helping yet. Denies chest pain shortness of breath orthopnea nor peripheral edema  Follow-up anxiety: She stopped taking Cymbalta 1 week ago. She had some hyper sensation that lasted for a day on Wednesday but she is back to her normal state of health and denies any anxiety or irritability depression or any mental disturbance.  Follow-up right shoulder pain: She tells me that she is almost 100% better and without pain in her right shoulder. It will return if she sleeps on her right shoulder but pain is not interfering with quality of life. Symptoms got better after doing home rehabilitative exercises  She is a rash on both cheeks that has been present for the past few weeks. It has not improved with vitamin E moisturizer. No other interventions. It's itchy she's never had this before and denies skin changes elsewhere.   Review Of Systems Outlined In HPI  Past Medical History  Diagnosis Date  . Hypertension   . Anxiety   . Seasonal allergies   . Asthma     with severe URIs  . Bronchitis   . Essential hypertension, benign 12/01/2012  . Generalized anxiety disorder 12/01/2012    Past Surgical History  Procedure Laterality Date  . Knee arthroscopy w/ medial collateral ligament (mcl) repair Left 10-07-2012  . Tonsillectomy  1969  . Abdominal hysterectomy  2011  . Cholecystectomy  2012  . Ankle surgery     Family History  Problem Relation Age of Onset  . Hypertension Mother   . Diabetes Father     maternal granndmother  . Hypertension Father   . Hypertension Sister   . Hypertension Brother   .  Alcoholism Father   . Colon cancer Father   . Lung cancer Father   . Depression Father   . Hyperlipidemia Maternal Grandfather   . Stroke Paternal Grandmother     History   Social History  . Marital Status: Married    Spouse Name: N/A  . Number of Children: N/A  . Years of Education: N/A   Occupational History  . Not on file.   Social History Main Topics  . Smoking status: Never Smoker   . Smokeless tobacco: Not on file  . Alcohol Use: Yes  . Drug Use: No  . Sexual Activity: Not Currently   Other Topics Concern  . Not on file   Social History Narrative     Objective: BP 140/90 mmHg  Pulse 81  Wt 206 lb (93.441 kg)  General: Alert and Oriented, No Acute Distress HEENT: Pupils equal, round, reactive to light. Conjunctivae clear.  Moist mucous membranes Lungs: Clear to auscultation bilaterally, no wheezing/ronchi/rales.  Comfortable work of breathing. Good air movement. Cardiac: Regular rate and rhythm. Normal S1/S2.  No murmurs, rubs, nor gallops.   Extremities: No peripheral edema.  Strong peripheral pulses.  Mental Status: No depression, anxiety, nor agitation. Skin: Warm and dry. Mild eczematous changes on the bilateral cheeks around the jawline  Assessment & Plan: Acelyn was seen today for hypertension.  Diagnoses and all orders for this visit:  Essential hypertension, benign  Generalized anxiety disorder  Labral tear of shoulder, right, initial encounter  Eczema  Other orders -     desonide (DESOWEN) 0.05 % cream; Apply topically 2 (two) times daily. Use for up to two weeks.   Essential hypertension: Borderline control, continue losartan and stressed the importance of exercise to help with weight loss which will ultimately reduce blood pressure Anxiety: Controlled without any Cymbalta, no further intervention Suspected right labral tear of the shoulder: Resolved and stable Eczema: Start low-dose desonide for no more than 2 weeks   Return in about  3 months (around 02/27/2015) for BP Follow Up.

## 2015-02-27 ENCOUNTER — Encounter: Payer: Self-pay | Admitting: Family Medicine

## 2015-02-27 ENCOUNTER — Ambulatory Visit (INDEPENDENT_AMBULATORY_CARE_PROVIDER_SITE_OTHER): Payer: BLUE CROSS/BLUE SHIELD | Admitting: Family Medicine

## 2015-02-27 VITALS — BP 165/102 | HR 92 | Wt 204.0 lb

## 2015-02-27 DIAGNOSIS — L309 Dermatitis, unspecified: Secondary | ICD-10-CM | POA: Diagnosis not present

## 2015-02-27 DIAGNOSIS — M25562 Pain in left knee: Secondary | ICD-10-CM

## 2015-02-27 DIAGNOSIS — I10 Essential (primary) hypertension: Secondary | ICD-10-CM | POA: Diagnosis not present

## 2015-02-27 DIAGNOSIS — M25561 Pain in right knee: Secondary | ICD-10-CM | POA: Diagnosis not present

## 2015-02-27 MED ORDER — DESONIDE 0.05 % EX CREA: TOPICAL_CREAM | Freq: Two times a day (BID) | CUTANEOUS | Status: DC

## 2015-02-27 NOTE — Progress Notes (Signed)
CC: Debbie [name] is a 54 y.o. female is here for Hypertension   Subjective: HPI:  Follow-up of central hypertension: Continues to take losartan on daily basis. No outside blood pressures to report at home however blood pressure was in prehypertensive stat  last month at her bariatric clinic. She is adamant that her blood pressure today is only elevated because she had a uncharacteristically stressful day. Denies chest pain shortness of breath orthopnea nor peripheral edema  She's had the return of a red slightly itchy rash on her cheeks. Symptoms subsided after a few days of using desonide. She's not use this again since the symptoms restarted a few days ago. She is fearful that she is not allowed to use it since she had already used it for 2 weeks 3 months ago. He denies skin changes elsewhere. No fevers or chills   complains of bilateral knee pain described as "stiffness and cotton sensation in my joints"  that is present only after periods of inactivity. It is improved with the more activity that she engages in such as walking or doing strength training. There's been no swelling locking giving way or any other mechanical symptoms. She denies any redness swelling or warmth of either knee. It's been present on a daily basis to mild degree for at least a few months now. No interventions as of yet other than working out.    Review Of Systems Outlined In HPI  Past Medical History  Diagnosis Date  . Hypertension   . Anxiety   . Seasonal allergies   . Asthma     with severe URIs  . Bronchitis   . Essential hypertension, benign 12/01/2012  . Generalized anxiety disorder 12/01/2012    Past Surgical History  Procedure Laterality Date  . Knee arthroscopy w/ medial collateral ligament (mcl) repair Left 10-07-2012  . Tonsillectomy  1969  . Abdominal hysterectomy  2011  . Cholecystectomy  2012  . Ankle surgery     Family History  Problem Relation Age of Onset  . Hypertension Mother   .  Diabetes Father     maternal granndmother  . Hypertension Father   . Hypertension Sister   . Hypertension Brother   . Alcoholism Father   . Colon cancer Father   . Lung cancer Father   . Depression Father   . Hyperlipidemia Maternal Grandfather   . Stroke Paternal Grandmother     History   Social History  . Marital Status: Married    Spouse Name: N/A  . Number of Children: N/A  . Years of Education: N/A   Occupational History  . Not on file.   Social History Main Topics  . Smoking status: Never Smoker   . Smokeless tobacco: Not on file  . Alcohol Use: Yes  . Drug Use: No  . Sexual Activity: Not Currently   Other Topics Concern  . Not on file   Social History Narrative     Objective: BP 165/102 mmHg  Pulse 92  Wt 204 lb (92.534 kg)  General: Alert and Oriented, No Acute Distress HEENT: Pupils equal, round, reactive to light. Conjunctivae clear. Moist mucous membranes  Lungs: Clear to auscultation bilaterally, no wheezing/ronchi/rales.  Comfortable work of breathing. Good air movement. Cardiac: Regular rate and rhythm. Normal S1/S2.  No murmurs, rubs, nor gallops.   Extremities: No peripheral edema.  Strong peripheral pulses. Full range of motion and strength of both lower extremities without any swelling redness or warmth of either knee.  Mental  Status: No depression, anxiety, nor agitation. Skin: Warm and dry.Mild eczematous changes on both lower jawline  Assessment & Plan: Zyra was seen today for hypertension.  Diagnoses and all orders for this visit:  Essential hypertension, benign  Bilateral knee pain  Eczema Orders: -     desonide (DESOWEN) 0.05 % cream; Apply topically 2 (two) times daily. Use for up to two weeks.   Essential hypertension: Uncontrolled chronic condition, given that she is adamant that her blood pressure is elevated only because of a uncharacteristically stressful day today we will test this belief by having her check her blood  pressure every morning for the next week and drop it off for my review. She may need to start on losartan-hydrochlorothiazide. Bilateral knee pain: History highly suspicious for osteoarthritis she was given a handout of home rehabilitation exercises to do on a daily basis for the next month. Eczema: Discussed she can restart desonide for another 2 weeks and then as needed.   Return in about 3 months (around 05/30/2015).

## 2015-02-27 NOTE — Patient Instructions (Signed)
Write daily BP values down below and drop off after one week:                  .

## 2015-03-03 LAB — LIPID PANEL
CHOLESTEROL: 145 mg/dL (ref 0–200)
HDL: 72 mg/dL — AB (ref 35–70)

## 2015-03-03 LAB — BASIC METABOLIC PANEL: GLUCOSE: 71 mg/dL

## 2015-03-04 LAB — HM MAMMOGRAPHY

## 2015-03-07 ENCOUNTER — Telehealth: Payer: Self-pay | Admitting: Family Medicine

## 2015-03-07 DIAGNOSIS — I10 Essential (primary) hypertension: Secondary | ICD-10-CM

## 2015-03-07 MED ORDER — LOSARTAN POTASSIUM-HCTZ 100-25 MG PO TABS: 1.0000 | ORAL_TABLET | Freq: Every day | ORAL | Status: DC

## 2015-03-07 NOTE — Telephone Encounter (Signed)
Seth Bake, Will you please thank patient for dropping off her BP log.  I'd recommend starting a different formulation of losartan that also contains hydrochlorothiazide, another ingredient to lower blood pressure.  Can she then f/u with me in one month after this switch.  Stop the old form of losartan.

## 2015-03-08 NOTE — Telephone Encounter (Signed)
Pt notified and transferred up front to schedule an appt

## 2015-03-14 ENCOUNTER — Encounter: Payer: Self-pay | Admitting: Family Medicine

## 2015-03-15 ENCOUNTER — Encounter: Payer: Self-pay | Admitting: *Deleted

## 2015-03-22 ENCOUNTER — Encounter: Payer: Self-pay | Admitting: *Deleted

## 2015-04-10 ENCOUNTER — Ambulatory Visit (INDEPENDENT_AMBULATORY_CARE_PROVIDER_SITE_OTHER): Payer: BLUE CROSS/BLUE SHIELD | Admitting: Family Medicine

## 2015-04-10 ENCOUNTER — Encounter: Payer: Self-pay | Admitting: Family Medicine

## 2015-04-10 VITALS — BP 128/84 | HR 72 | Wt 202.0 lb

## 2015-04-10 DIAGNOSIS — I10 Essential (primary) hypertension: Secondary | ICD-10-CM

## 2015-04-10 MED ORDER — LOSARTAN POTASSIUM-HCTZ 100-25 MG PO TABS: 1.0000 | ORAL_TABLET | Freq: Every day | ORAL | Status: DC

## 2015-04-10 NOTE — Progress Notes (Signed)
CC: Debbie [name] is a 54 y.o. female is here for Hypertension   Subjective: HPI:  Follow essential hypertension: Since I saw her last she has been tolerating losartan-hydrochlorothiazide she had her blood pressure checked last week at a separate office and it was 107/70. She tells me she feels fine and has had no known side effects. She denies lightheadedness, chest pain, shortness of breath, orthopnea nor peripheral edema. she's noticed she is urinating a few more times during the day but symptoms are not interfering with quality of life.   Review Of Systems Outlined In HPI  Past Medical History  Diagnosis Date  . Hypertension   . Anxiety   . Seasonal allergies   . Asthma     with severe URIs  . Bronchitis   . Essential hypertension, benign 12/01/2012  . Generalized anxiety disorder 12/01/2012    Past Surgical History  Procedure Laterality Date  . Knee arthroscopy w/ medial collateral ligament (mcl) repair Left 10-07-2012  . Tonsillectomy  1969  . Abdominal hysterectomy  2011  . Cholecystectomy  2012  . Ankle surgery     Family History  Problem Relation Age of Onset  . Hypertension Mother   . Diabetes Father     maternal granndmother  . Hypertension Father   . Hypertension Sister   . Hypertension Brother   . Alcoholism Father   . Colon cancer Father   . Lung cancer Father   . Depression Father   . Hyperlipidemia Maternal Grandfather   . Stroke Paternal Grandmother     Social History   Social History  . Marital Status: Married    Spouse Name: N/A  . Number of Children: N/A  . Years of Education: N/A   Occupational History  . Not on file.   Social History Main Topics  . Smoking status: Never Smoker   . Smokeless tobacco: Not on file  . Alcohol Use: Yes  . Drug Use: No  . Sexual Activity: Not Currently   Other Topics Concern  . Not on file   Social History Narrative     Objective: BP 128/84 mmHg  Pulse 72  Wt 202 lb (91.627 kg)  Vital signs  reviewed. General: Alert and Oriented, No Acute Distress HEENT: Pupils equal, round, reactive to light. Conjunctivae clear.  External ears unremarkable.  Moist mucous membranes. Lungs: Clear and comfortable work of breathing, speaking in full sentences without accessory muscle use. Cardiac: Regular rate and rhythm.  Neuro: CN II-XII grossly intact, gait normal. Extremities: No peripheral edema.  Strong peripheral pulses.  Mental Status: No depression, anxiety, nor agitation. Logical though process. Skin: Warm and dry.  Assessment & Plan: [name] was seen today for hypertension.  Diagnoses and all orders for this visit:  Essential hypertension, benign  Other orders -     losartan-hydrochlorothiazide (HYZAAR) 100-25 MG per tablet; Take 1 tablet by mouth daily.   Essential hypertension: Controlled, continue losartan-HCTZ in follow-up in 6 months, sooner if needed  Return in about 6 months (around 10/11/2015).

## 2015-05-30 ENCOUNTER — Ambulatory Visit: Payer: BLUE CROSS/BLUE SHIELD | Admitting: Family Medicine

## 2015-06-12 ENCOUNTER — Ambulatory Visit (INDEPENDENT_AMBULATORY_CARE_PROVIDER_SITE_OTHER): Payer: BLUE CROSS/BLUE SHIELD | Admitting: Family Medicine

## 2015-06-12 ENCOUNTER — Encounter: Payer: Self-pay | Admitting: Family Medicine

## 2015-06-12 VITALS — BP 134/83 | HR 71 | Wt 201.0 lb

## 2015-06-12 DIAGNOSIS — L309 Dermatitis, unspecified: Secondary | ICD-10-CM | POA: Diagnosis not present

## 2015-06-12 DIAGNOSIS — I10 Essential (primary) hypertension: Secondary | ICD-10-CM

## 2015-06-12 DIAGNOSIS — B009 Herpesviral infection, unspecified: Secondary | ICD-10-CM | POA: Diagnosis not present

## 2015-06-12 MED ORDER — VALACYCLOVIR HCL 1 G PO TABS: ORAL_TABLET | ORAL | Status: DC

## 2015-06-12 MED ORDER — DESONIDE 0.05 % EX CREA: TOPICAL_CREAM | Freq: Two times a day (BID) | CUTANEOUS | Status: DC

## 2015-06-12 NOTE — Progress Notes (Signed)
CC: Debbie [name] is a 54 y.o. female is here for Hypertension   Subjective: HPI:  Essential hypertension: Continues to take losartan-hydrochlorothiazide on a daily basis. She checks her blood pressure a few times a month and notices that it's always in the normotensive range. She denies any known side effects. She is exercising most days of the week. She denies chest pain shortness of breath orthopnea nor peripheral edema  Follow-up eczema: She tells me desonide has been helping greatly with eczema on her face. She uses it 2 weeks on 2 weeks off. She is not using any moisturizer during the two-week holidays. She tells me symptoms become mild in severity by the end of that week and then they're absent after daily applications for 2 weeks. She denies any skin changes elsewhere.  Follow-up HSV infection: She has not had any outbreaks of cold sores on her lips over the past year. Her prescription for Valtrex has expired. Symptoms have improved ever since she started eating healthier and seen a therapist.   Review Of Systems Outlined In HPI  Past Medical History  Diagnosis Date  . Hypertension   . Anxiety   . Seasonal allergies   . Asthma     with severe URIs  . Bronchitis   . Essential hypertension, benign 12/01/2012  . Generalized anxiety disorder 12/01/2012    Past Surgical History  Procedure Laterality Date  . Knee arthroscopy w/ medial collateral ligament (mcl) repair Left 10-07-2012  . Tonsillectomy  1969  . Abdominal hysterectomy  2011  . Cholecystectomy  2012  . Ankle surgery     Family History  Problem Relation Age of Onset  . Hypertension Mother   . Diabetes Father     maternal granndmother  . Hypertension Father   . Hypertension Sister   . Hypertension Brother   . Alcoholism Father   . Colon cancer Father   . Lung cancer Father   . Depression Father   . Hyperlipidemia Maternal Grandfather   . Stroke Paternal Grandmother     Social History   Social History   . Marital Status: Married    Spouse Name: N/A  . Number of Children: N/A  . Years of Education: N/A   Occupational History  . Not on file.   Social History Main Topics  . Smoking status: Never Smoker   . Smokeless tobacco: Not on file  . Alcohol Use: Yes  . Drug Use: No  . Sexual Activity: Not Currently   Other Topics Concern  . Not on file   Social History Narrative     Objective: BP 134/83 mmHg  Pulse 71  Wt 201 lb (91.173 kg)  General: Alert and Oriented, No Acute Distress HEENT: Pupils equal, round, reactive to light. Conjunctivae clear. Moist mucous membranes Lungs: Clear to auscultation bilaterally, no wheezing/ronchi/rales.  Comfortable work of breathing. Good air movement. Cardiac: Regular rate and rhythm. Normal S1/S2.  No murmurs, rubs, nor gallops.   Extremities: No peripheral edema.  Strong peripheral pulses.  Mental Status: No depression, anxiety, nor agitation. Skin: Warm and dry. Trace eczematous changes on the jawline bilaterally  Assessment & Plan: Verna was seen today for hypertension.  Diagnoses and all orders for this visit:  Essential hypertension, benign  Eczema -     desonide (DESOWEN) 0.05 % cream; Apply topically 2 (two) times daily. Use for up to two weeks.  HSV infection -     Discontinue: valACYclovir (VALTREX) 1000 MG tablet; Take two tabs by mouth  every 12 hours for one day.  Take at onset of cold sore -     valACYclovir (VALTREX) 1000 MG tablet; Take two tabs by mouth every 12 hours for one day.  Take at onset of cold sore  Other orders -     Cancel: desonide (DESOWEN) 0.05 % cream; Apply topically 2 (two) times daily. Use for up to two weeks.   Essential hypertension: Controlled continue  lisinopril-hydrochlorothiazide Abdomen: Controlled with desonide use, encouraged her to use Eucerin cream during her two-week holiday in hopes of lengthening the duration between two-week applications of steroid HSV infection: asymptomatic  with reduction of stress in her life, continue to exercise almost daily   Return in about 6 months (around 12/11/2015).

## 2015-06-20 ENCOUNTER — Other Ambulatory Visit: Payer: Self-pay | Admitting: Family Medicine

## 2015-06-20 DIAGNOSIS — L309 Dermatitis, unspecified: Secondary | ICD-10-CM

## 2015-06-20 MED ORDER — DESONIDE 0.05 % EX CREA: TOPICAL_CREAM | Freq: Two times a day (BID) | CUTANEOUS | Status: DC

## 2015-06-29 ENCOUNTER — Encounter: Payer: Self-pay | Admitting: Family Medicine

## 2015-07-04 ENCOUNTER — Other Ambulatory Visit: Payer: Self-pay

## 2015-07-04 DIAGNOSIS — L309 Dermatitis, unspecified: Secondary | ICD-10-CM

## 2015-07-04 MED ORDER — DESONIDE 0.05 % EX CREA: TOPICAL_CREAM | Freq: Two times a day (BID) | CUTANEOUS | Status: DC

## 2015-09-30 ENCOUNTER — Other Ambulatory Visit: Payer: Self-pay | Admitting: Family Medicine

## 2015-11-14 ENCOUNTER — Other Ambulatory Visit: Payer: Self-pay

## 2015-11-14 MED ORDER — LOSARTAN POTASSIUM-HCTZ 100-25 MG PO TABS: 1.0000 | ORAL_TABLET | Freq: Every day | ORAL | Status: DC

## 2015-12-11 ENCOUNTER — Ambulatory Visit (INDEPENDENT_AMBULATORY_CARE_PROVIDER_SITE_OTHER): Payer: BLUE CROSS/BLUE SHIELD | Admitting: Family Medicine

## 2015-12-11 ENCOUNTER — Encounter: Payer: Self-pay | Admitting: Family Medicine

## 2015-12-11 VITALS — BP 122/82 | HR 71 | Wt 205.0 lb

## 2015-12-11 DIAGNOSIS — J4520 Mild intermittent asthma, uncomplicated: Secondary | ICD-10-CM

## 2015-12-11 DIAGNOSIS — F411 Generalized anxiety disorder: Secondary | ICD-10-CM | POA: Diagnosis not present

## 2015-12-11 DIAGNOSIS — G4700 Insomnia, unspecified: Secondary | ICD-10-CM | POA: Diagnosis not present

## 2015-12-11 DIAGNOSIS — I10 Essential (primary) hypertension: Secondary | ICD-10-CM | POA: Diagnosis not present

## 2015-12-11 MED ORDER — LOSARTAN POTASSIUM-HCTZ 100-25 MG PO TABS: 1.0000 | ORAL_TABLET | Freq: Every day | ORAL | Status: DC

## 2015-12-11 MED ORDER — ALBUTEROL SULFATE HFA 108 (90 BASE) MCG/ACT IN AERS: 2.0000 | INHALATION_SPRAY | RESPIRATORY_TRACT | Status: DC | PRN

## 2015-12-11 MED ORDER — VALACYCLOVIR HCL 1 G PO TABS: ORAL_TABLET | ORAL | Status: DC

## 2015-12-11 MED ORDER — ZOLPIDEM TARTRATE 5 MG PO TABS: 5.0000 mg | ORAL_TABLET | Freq: Every evening | ORAL | Status: DC | PRN

## 2015-12-11 NOTE — Progress Notes (Signed)
CC: Debbie [name] is a 55 y.o. female is here for Hypertension and Medication Refill   Subjective: HPI:  Follow-up reactive airway disease: Her albuterol expired in October. She's been using this ever since April started. She is to return times a week for wheezing. It's worse when exposed to pollen. It's better she stays indoors and after pollen levels have been reduced. She denies shortness of breath, chest pain or skin breakouts.  She is requesting refill on Ambien. She continues to use his most nights out of the week. She takes if she's had a stressful day and she is anxious at night. She is not sure what causes anxiousness other than work demands. She denies any side effects or sleepwalking.  Follow-up essential hypertension: She is taking Hyzaar on a daily basis with no known side effects. She is requesting refills. She has not experienced any shortness of breath, chest pain or motor or sensory disturbances.  Requesting a refill on Valtrex, only breaking out with cold sores if she has an upper respiratory infection usually gets well controlled by taking when necessary Valtrex. Denies any other mucosal lesions, only involves the lips   Review Of Systems Outlined In HPI  Past Medical History  Diagnosis Date  . Hypertension   . Anxiety   . Seasonal allergies   . Asthma     with severe URIs  . Bronchitis   . Essential hypertension, benign 12/01/2012  . Generalized anxiety disorder 12/01/2012    Past Surgical History  Procedure Laterality Date  . Knee arthroscopy w/ medial collateral ligament (mcl) repair Left 10-07-2012  . Tonsillectomy  1969  . Abdominal hysterectomy  2011  . Cholecystectomy  2012  . Ankle surgery     Family History  Problem Relation Age of Onset  . Hypertension Mother   . Diabetes Father     maternal granndmother  . Hypertension Father   . Hypertension Sister   . Hypertension Brother   . Alcoholism Father   . Colon cancer Father   . Lung cancer  Father   . Depression Father   . Hyperlipidemia Maternal Grandfather   . Stroke Paternal Grandmother     Social History   Social History  . Marital Status: Married    Spouse Name: N/A  . Number of Children: N/A  . Years of Education: N/A   Occupational History  . Not on file.   Social History Main Topics  . Smoking status: Never Smoker   . Smokeless tobacco: Not on file  . Alcohol Use: Yes  . Drug Use: No  . Sexual Activity: Not Currently   Other Topics Concern  . Not on file   Social History Narrative     Objective: BP 122/82 mmHg  Pulse 71  Wt 205 lb (92.987 kg)  General: Alert and Oriented, No Acute Distress HEENT: Pupils equal, round, reactive to light. Conjunctivae clear. Moist mucous membranes Lungs: Clear to auscultation bilaterally, no wheezing/ronchi/rales.  Comfortable work of breathing. Good air movement. Cardiac: Regular rate and rhythm. Normal S1/S2.  No murmurs, rubs, nor gallops.   Extremities: No peripheral edema.  Strong peripheral pulses.  Mental Status: No depression, anxiety, nor agitation. Skin: Warm and dry.  Assessment & Plan: Debbie [name] was seen today for hypertension and medication refill.  Diagnoses and all orders for this visit:  Reactive airway disease, mild intermittent, uncomplicated -     albuterol (PROVENTIL HFA;VENTOLIN HFA) 108 (90 Base) MCG/ACT inhaler; Inhale 2 puffs into the lungs every 4 (  four) hours as needed for wheezing.  Insomnia -     zolpidem (AMBIEN) 5 MG tablet; Take 1 tablet (5 mg total) by mouth at bedtime as needed for sleep.  Essential hypertension, benign  Generalized anxiety disorder  Other orders -     Cancel: losartan-hydrochlorothiazide (HYZAAR) 100-25 MG tablet; Take 1 tablet by mouth daily. -     Cancel: valACYclovir (VALTREX) 1000 MG tablet;  -     losartan-hydrochlorothiazide (HYZAAR) 100-25 MG tablet; Take 1 tablet by mouth daily. 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

## 2015-12-12 ENCOUNTER — Encounter: Payer: Self-pay | Admitting: Family Medicine

## 2015-12-18 ENCOUNTER — Encounter: Payer: Self-pay | Admitting: Family Medicine

## 2015-12-18 ENCOUNTER — Ambulatory Visit (INDEPENDENT_AMBULATORY_CARE_PROVIDER_SITE_OTHER): Payer: BLUE CROSS/BLUE SHIELD | Admitting: Family Medicine

## 2015-12-18 VITALS — BP 128/84 | HR 73 | Wt 202.0 lb

## 2015-12-18 DIAGNOSIS — K2970 Gastritis, unspecified, without bleeding: Secondary | ICD-10-CM

## 2015-12-18 DIAGNOSIS — J189 Pneumonia, unspecified organism: Secondary | ICD-10-CM

## 2015-12-18 MED ORDER — DOXYCYCLINE HYCLATE 100 MG PO TABS: ORAL_TABLET | ORAL | Status: AC

## 2015-12-18 MED ORDER — PANTOPRAZOLE SODIUM 40 MG PO TBEC: 40.0000 mg | DELAYED_RELEASE_TABLET | Freq: Every day | ORAL | Status: DC

## 2015-12-18 MED ORDER — DOXYCYCLINE HYCLATE 100 MG PO TABS: ORAL_TABLET | ORAL | Status: DC

## 2015-12-18 NOTE — Progress Notes (Signed)
CC: Debbie [name] is a 55 y.o. female is here for Medication Reaction and Gastroesophageal Reflux   Subjective: HPI:  At the end of last week she was getting some epigastric pain so she went to a local emergency room because the pain woke her up in the middle of the night. She had a CT scan of the abdomen and pelvis that showed a left lower lobe opacity. She was started on Levaquin and the day that she started this she began experiencing moderate joint and muscle pain in the lower extremities. Symptoms are present at rest and with activity. She's never had this before. She denies any skin changes. She still having some epigastric discomfort and Pepcid AC has not been of any benefit. She has a productive cough but no blood in the sputum. She denies fevers, chills, shortness of breath, wheezing or chest pain.   Review Of Systems Outlined In HPI  Past Medical History  Diagnosis Date  . Hypertension   . Anxiety   . Seasonal allergies   . Asthma     with severe URIs  . Bronchitis   . Essential hypertension, benign 12/01/2012  . Generalized anxiety disorder 12/01/2012    Past Surgical History  Procedure Laterality Date  . Knee arthroscopy w/ medial collateral ligament (mcl) repair Left 10-07-2012  . Tonsillectomy  1969  . Abdominal hysterectomy  2011  . Cholecystectomy  2012  . Ankle surgery     Family History  Problem Relation Age of Onset  . Hypertension Mother   . Diabetes Father     maternal granndmother  . Hypertension Father   . Hypertension Sister   . Hypertension Brother   . Alcoholism Father   . Colon cancer Father   . Lung cancer Father   . Depression Father   . Hyperlipidemia Maternal Grandfather   . Stroke Paternal Grandmother     Social History   Social History  . Marital Status: Married    Spouse Name: N/A  . Number of Children: N/A  . Years of Education: N/A   Occupational History  . Not on file.   Social History Main Topics  . Smoking status: Never  Smoker   . Smokeless tobacco: Not on file  . Alcohol Use: Yes  . Drug Use: No  . Sexual Activity: Not Currently   Other Topics Concern  . Not on file   Social History Narrative     Objective: BP 128/84 mmHg  Pulse 73  Wt 202 lb (91.627 kg)  General: Alert and Oriented, No Acute Distress HEENT: Pupils equal, round, reactive to light. Conjunctivae clear.  Moist mucous membranes Lungs: Clear to auscultation bilaterally, no wheezing/ronchi/rales.  Comfortable work of breathing. Good air movement. Cardiac: Regular rate and rhythm. Normal S1/S2.  No murmurs, rubs, nor gallops.   Abdomen: Mild obesity soft nontender Extremities: No peripheral edema.  Strong peripheral pulses.  Mental Status: No depression, anxiety, nor agitation. Skin: Warm and dry.  Assessment & Plan: Debbie [name] was seen today for medication reaction and gastroesophageal reflux.  Diagnoses and all orders for this visit:  Gastritis -     Discontinue: pantoprazole (PROTONIX) 40 MG tablet; Take 1 tablet (40 mg total) by mouth daily. -     pantoprazole (PROTONIX) 40 MG tablet; Take 1 tablet (40 mg total) by mouth daily.  CAP (community acquired pneumonia) -     Discontinue: doxycycline (VIBRA-TABS) 100 MG tablet; One by mouth twice a day for ten days. -  doxycycline (VIBRA-TABS) 100 MG tablet; One by mouth twice a day for ten days.   Community acquired pneumonia with a side effect from levofloxacin. She has a few allergies to limit antibiotic options but she should get better with doxycycline given that she has a lobar pneumonia. Gastritis with reflux: Stopping Pepcid AC begin Protonix  25 minutes spent face-to-face during visit today of which at least 50% was counseling or coordinating care regarding: 1. Gastritis   2. CAP (community acquired pneumonia)      Return if symptoms worsen or fail to improve.

## 2016-01-30 ENCOUNTER — Other Ambulatory Visit: Payer: Self-pay

## 2016-01-30 DIAGNOSIS — K2970 Gastritis, unspecified, without bleeding: Secondary | ICD-10-CM

## 2016-01-30 MED ORDER — PANTOPRAZOLE SODIUM 40 MG PO TBEC: 40.0000 mg | DELAYED_RELEASE_TABLET | Freq: Every day | ORAL | Status: DC

## 2016-05-29 ENCOUNTER — Other Ambulatory Visit: Payer: Self-pay | Admitting: *Deleted

## 2016-05-29 MED ORDER — PANTOPRAZOLE SODIUM 40 MG PO TBEC: 40.0000 mg | DELAYED_RELEASE_TABLET | Freq: Every day | ORAL | 0 refills | Status: DC

## 2016-06-26 ENCOUNTER — Encounter: Payer: Self-pay | Admitting: Physician Assistant

## 2016-06-26 ENCOUNTER — Ambulatory Visit (INDEPENDENT_AMBULATORY_CARE_PROVIDER_SITE_OTHER): Payer: BLUE CROSS/BLUE SHIELD | Admitting: Physician Assistant

## 2016-06-26 VITALS — BP 120/60 | HR 77 | Ht 65.25 in | Wt 200.0 lb

## 2016-06-26 DIAGNOSIS — M25519 Pain in unspecified shoulder: Secondary | ICD-10-CM

## 2016-06-26 DIAGNOSIS — I10 Essential (primary) hypertension: Secondary | ICD-10-CM

## 2016-06-26 DIAGNOSIS — G8929 Other chronic pain: Secondary | ICD-10-CM

## 2016-06-26 DIAGNOSIS — E6609 Other obesity due to excess calories: Secondary | ICD-10-CM | POA: Diagnosis not present

## 2016-06-26 DIAGNOSIS — F411 Generalized anxiety disorder: Secondary | ICD-10-CM | POA: Diagnosis not present

## 2016-06-26 DIAGNOSIS — K219 Gastro-esophageal reflux disease without esophagitis: Secondary | ICD-10-CM

## 2016-06-26 DIAGNOSIS — N62 Hypertrophy of breast: Secondary | ICD-10-CM

## 2016-06-26 DIAGNOSIS — F5101 Primary insomnia: Secondary | ICD-10-CM

## 2016-06-26 DIAGNOSIS — M549 Dorsalgia, unspecified: Secondary | ICD-10-CM

## 2016-06-26 DIAGNOSIS — Z6830 Body mass index (BMI) 30.0-30.9, adult: Secondary | ICD-10-CM

## 2016-06-26 HISTORY — DX: Hypertrophy of breast: N62

## 2016-06-26 HISTORY — DX: Primary insomnia: F51.01

## 2016-06-26 HISTORY — DX: Other chronic pain: G89.29

## 2016-06-26 HISTORY — DX: Dorsalgia, unspecified: M54.9

## 2016-06-26 MED ORDER — BUPROPION HCL ER (XL) 150 MG PO TB24
150.0000 mg | ORAL_TABLET | ORAL | 0 refills | Status: DC
Start: 2016-06-26 — End: 2016-10-08

## 2016-06-26 MED ORDER — LOSARTAN POTASSIUM-HCTZ 100-25 MG PO TABS
1.0000 | ORAL_TABLET | Freq: Every day | ORAL | 1 refills | Status: DC
Start: 2016-06-26 — End: 2017-01-17

## 2016-06-26 MED ORDER — PANTOPRAZOLE SODIUM 40 MG PO TBEC: 40.0000 mg | DELAYED_RELEASE_TABLET | Freq: Every day | ORAL | 4 refills | Status: DC

## 2016-06-26 MED ORDER — ZOLPIDEM TARTRATE 5 MG PO TABS: 5.0000 mg | ORAL_TABLET | Freq: Every evening | ORAL | 1 refills | Status: DC | PRN

## 2016-06-26 NOTE — Progress Notes (Signed)
Subjective:    Patient ID: Debbie [name], female    DOB: 10/29/1960, 55 y.o.   MRN: [id number]  HPI  Pt is a 55 yo female who presents to the clinic to establish care.   .. Active Ambulatory Problems    Diagnosis Date Noted  . Essential hypertension, benign 12/01/2012  . Generalized anxiety disorder 12/01/2012  . Family history of colon cancer 12/01/2012  . History of hysterectomy 01/04/2013  . History of shingles 01/04/2013  . Gastritis 02/17/2013  . Class 1 obesity due to excess calories with serious comorbidity and body mass index (BMI) of 30.0 to 30.9 in adult 03/05/2013  . Elevated LFTs 05/11/2013  . Labral tear of shoulder 08/29/2014  . Large breasts 06/26/2016  . Primary insomnia 06/26/2016  . Mid back pain 06/26/2016  . Chronic shoulder pain 06/26/2016   Resolved Ambulatory Problems    Diagnosis Date Noted  . No Resolved Ambulatory Problems   Past Medical History:  Diagnosis Date  . Anxiety   . Asthma   . Bronchitis   . Essential hypertension, benign 12/01/2012  . Generalized anxiety disorder 12/01/2012  . Hypertension   . Seasonal allergies    .Marland Kitchen Family History  Problem Relation Age of Onset  . Hypertension Mother   . Diabetes Father     maternal granndmother  . Hypertension Father   . Hypertension Sister   . Hypertension Brother   . Alcoholism Father   . Colon cancer Father   . Lung cancer Father   . Depression Father   . Hyperlipidemia Maternal Grandfather   . Stroke Paternal Grandmother    .Marland Kitchen Social History   Social History  . Marital status: Married    Spouse name: N/A  . Number of children: N/A  . Years of education: N/A   Occupational History  . Not on file.   Social History Main Topics  . Smoking status: Never Smoker  . Smokeless tobacco: Not on file  . Alcohol use Yes  . Drug use: No  . Sexual activity: Not Currently   Other Topics Concern  . Not on file   Social History Narrative  . No narrative on file   HTN- doing  well. Taking medication daily. No CP, palpitations, SOB, dizziness.   Insomnia- she uses ambien occasionally for sleep. Melatonin every night.   She would like to lose more weight. She has seen Dr. Valetta Close and lost from 270 to 186. She did not lose weight on saxenda. Phentermine made her heart race. She has large breast and causes mid back pain, bilateral shoulder pain, neck pain. She is a 55 I. She wonders if there is anything she can take.   Review of Systems  All other systems reviewed and are negative.      Objective:   Physical Exam  Constitutional: She is oriented to person, place, and time. She appears well-developed and well-nourished.  HENT:  Head: Normocephalic and atraumatic.  Cardiovascular: Normal rate, regular rhythm and normal heart sounds.   Pulmonary/Chest: Effort normal and breath sounds normal.  Large breast.  Shoulder notching, bilateral.   Neurological: She is alert and oriented to person, place, and time.  Psychiatric: She has a normal mood and affect. Her behavior is normal.          Assessment & Plan:  Marland KitchenMarland KitchenDiagnoses and all orders for this visit:  Class 1 obesity due to excess calories with serious comorbidity and body mass index (BMI) of 30.0 to 30.9 in adult -  buPROPion (WELLBUTRIN XL) 150 MG 24 hr tablet; Take 1 tablet (150 mg total) by mouth every morning. -     Ambulatory referral to Plastic Surgery  Primary insomnia -     zolpidem (AMBIEN) 5 MG tablet; Take 1 tablet (5 mg total) by mouth at bedtime as needed for sleep.  Generalized anxiety disorder -     buPROPion (WELLBUTRIN XL) 150 MG 24 hr tablet; Take 1 tablet (150 mg total) by mouth every morning.  Essential hypertension, benign -     losartan-hydrochlorothiazide (HYZAAR) 100-25 MG tablet; Take 1 tablet by mouth daily.  Large breasts -     Ambulatory referral to Plastic Surgery  Chronic shoulder pain, unspecified laterality -     Ambulatory referral to Plastic Surgery  Mid back  pain -     Ambulatory referral to Plastic Surgery  Gastroesophageal reflux disease, esophagitis presence not specified -     pantoprazole (PROTONIX) 40 MG tablet; Take 1 tablet (40 mg total) by mouth daily.   Vitals look great.  Discussed side effects of wellbutrin.  Discussed 1500 calorie diet and 150 minutes of exercise a week.  Follow up in 2 months on weight loss.

## 2016-07-08 DIAGNOSIS — N62 Hypertrophy of breast: Secondary | ICD-10-CM | POA: Diagnosis not present

## 2016-08-20 ENCOUNTER — Other Ambulatory Visit: Payer: Self-pay

## 2016-08-20 MED ORDER — VALACYCLOVIR HCL 1 G PO TABS: ORAL_TABLET | ORAL | 2 refills | Status: DC

## 2016-08-26 ENCOUNTER — Ambulatory Visit: Payer: BLUE CROSS/BLUE SHIELD | Admitting: Physician Assistant

## 2016-10-08 ENCOUNTER — Ambulatory Visit (INDEPENDENT_AMBULATORY_CARE_PROVIDER_SITE_OTHER): Payer: BLUE CROSS/BLUE SHIELD | Admitting: Physician Assistant

## 2016-10-08 ENCOUNTER — Encounter: Payer: Self-pay | Admitting: Physician Assistant

## 2016-10-08 DIAGNOSIS — F411 Generalized anxiety disorder: Secondary | ICD-10-CM

## 2016-10-08 DIAGNOSIS — Z6830 Body mass index (BMI) 30.0-30.9, adult: Secondary | ICD-10-CM | POA: Diagnosis not present

## 2016-10-08 DIAGNOSIS — E6609 Other obesity due to excess calories: Secondary | ICD-10-CM

## 2016-10-08 MED ORDER — LIRAGLUTIDE -WEIGHT MANAGEMENT 18 MG/3ML ~~LOC~~ SOPN: 0.6000 mg | PEN_INJECTOR | Freq: Every day | SUBCUTANEOUS | 1 refills | Status: DC

## 2016-10-08 MED ORDER — BUPROPION HCL ER (XL) 150 MG PO TB24: 150.0000 mg | ORAL_TABLET | ORAL | 1 refills | Status: DC

## 2016-10-08 NOTE — Progress Notes (Signed)
   Subjective:    Patient ID: Debbie [name], female    DOB: Apr 17, 1961, 56 y.o.   MRN: [id number]  HPI  Pt is a 56 yo female who presents to the clinic for 3 month follow up on wellbutrin. She was taking to help with mood, focus, and weight. She does feel better on wellbutrin but has gained 1 lb since last visit. She would like to discuss other weight loss medications. She tried and failed belviq due to not losing enough weight. She remembers trying saxenda and did lose weight she would like to try again. She does feel like wellbutrin has helped her emotional eating.     Review of Systems  All other systems reviewed and are negative.      Objective:   Physical Exam  Constitutional: She is oriented to person, place, and time. She appears well-developed and well-nourished.  HENT:  Head: Normocephalic and atraumatic.  Cardiovascular: Normal rate, regular rhythm and normal heart sounds.   Pulmonary/Chest: Effort normal and breath sounds normal.  Neurological: She is alert and oriented to person, place, and time.  Psychiatric: She has a normal mood and affect. Her behavior is normal.          Assessment & Plan:  Marland KitchenMarland KitchenDiagnoses and all orders for this visit:  Class 1 obesity due to excess calories with serious comorbidity and body mass index (BMI) of 30.0 to 30.9 in adult -     Liraglutide -Weight Management (SAXENDA) 18 MG/3ML SOPN; Inject 0.6 mg into the skin daily. For one week then increase by .6mg  weekly until reaches 3mg  daily.  Please include ultra fine needles 34mm -     buPROPion (WELLBUTRIN XL) 150 MG 24 hr tablet; Take 1 tablet (150 mg total) by mouth every morning.  Generalized anxiety disorder -     buPROPion (WELLBUTRIN XL) 150 MG 24 hr tablet; Take 1 tablet (150 mg total) by mouth every morning.   Discussed need 5  Percent loss of weight to stay on medication. Follow up in 1 month.  Encouraged calorie counting and regular exercise.  Pt failed belviq.

## 2016-10-10 ENCOUNTER — Other Ambulatory Visit: Payer: Self-pay | Admitting: *Deleted

## 2016-10-10 DIAGNOSIS — L309 Dermatitis, unspecified: Secondary | ICD-10-CM

## 2016-10-10 MED ORDER — DESONIDE 0.05 % EX CREA: TOPICAL_CREAM | Freq: Two times a day (BID) | CUTANEOUS | 1 refills | Status: DC

## 2016-10-14 ENCOUNTER — Telehealth: Payer: Self-pay | Admitting: *Deleted

## 2016-10-14 NOTE — Telephone Encounter (Signed)
Approval for saxenda over the phone. Case # is [id number]. Effective date is 09/14/2016 through 02/11/2017  Patient notified and pharm notified

## 2016-10-29 ENCOUNTER — Other Ambulatory Visit: Payer: Self-pay | Admitting: *Deleted

## 2016-10-29 DIAGNOSIS — Z6830 Body mass index (BMI) 30.0-30.9, adult: Principal | ICD-10-CM

## 2016-10-29 DIAGNOSIS — E6609 Other obesity due to excess calories: Secondary | ICD-10-CM

## 2016-10-29 MED ORDER — LIRAGLUTIDE -WEIGHT MANAGEMENT 18 MG/3ML ~~LOC~~ SOPN: 0.6000 mg | PEN_INJECTOR | Freq: Every day | SUBCUTANEOUS | 0 refills | Status: DC

## 2017-01-16 ENCOUNTER — Other Ambulatory Visit: Payer: Self-pay | Admitting: Physician Assistant

## 2017-01-16 DIAGNOSIS — I10 Essential (primary) hypertension: Secondary | ICD-10-CM

## 2017-01-17 ENCOUNTER — Ambulatory Visit (INDEPENDENT_AMBULATORY_CARE_PROVIDER_SITE_OTHER): Payer: BLUE CROSS/BLUE SHIELD | Admitting: Physician Assistant

## 2017-01-17 ENCOUNTER — Encounter: Payer: Self-pay | Admitting: Physician Assistant

## 2017-01-17 VITALS — BP 115/73 | HR 106 | Ht 65.25 in | Wt 182.0 lb

## 2017-01-17 DIAGNOSIS — E6609 Other obesity due to excess calories: Secondary | ICD-10-CM | POA: Diagnosis not present

## 2017-01-17 DIAGNOSIS — I10 Essential (primary) hypertension: Secondary | ICD-10-CM | POA: Diagnosis not present

## 2017-01-17 DIAGNOSIS — F411 Generalized anxiety disorder: Secondary | ICD-10-CM | POA: Diagnosis not present

## 2017-01-17 DIAGNOSIS — B351 Tinea unguium: Secondary | ICD-10-CM | POA: Diagnosis not present

## 2017-01-17 DIAGNOSIS — Z6830 Body mass index (BMI) 30.0-30.9, adult: Secondary | ICD-10-CM | POA: Diagnosis not present

## 2017-01-17 DIAGNOSIS — F5101 Primary insomnia: Secondary | ICD-10-CM

## 2017-01-17 HISTORY — DX: Tinea unguium: B35.1

## 2017-01-17 MED ORDER — BUPROPION HCL ER (XL) 150 MG PO TB24: 150.0000 mg | ORAL_TABLET | ORAL | 1 refills | Status: DC

## 2017-01-17 MED ORDER — LOSARTAN POTASSIUM-HCTZ 100-25 MG PO TABS: 1.0000 | ORAL_TABLET | Freq: Every day | ORAL | 1 refills | Status: DC

## 2017-01-17 MED ORDER — LIRAGLUTIDE -WEIGHT MANAGEMENT 18 MG/3ML ~~LOC~~ SOPN: 3.0000 mg | PEN_INJECTOR | Freq: Every day | SUBCUTANEOUS | 0 refills | Status: DC

## 2017-01-17 MED ORDER — CICLOPIROX 8 % EX SOLN: Freq: Every day | CUTANEOUS | 1 refills | Status: DC

## 2017-01-17 MED ORDER — ZOLPIDEM TARTRATE 5 MG PO TABS: 5.0000 mg | ORAL_TABLET | Freq: Every evening | ORAL | 1 refills | Status: DC | PRN

## 2017-01-17 NOTE — Progress Notes (Signed)
Subjective:    Patient ID: Debbie [name], female    DOB: 01/26/1961, 56 y.o.   MRN: [id number]  HPI  Pt is a 56 yo obese female who presents to the clinic to discuss weight loss. She started saxenda in February. She has lost from 201 to 182 almost 20lbs. She is exercising 4-5 times a week. She is seeing a nutritionist. She feels really good. She has lots of energy. She does admit to having some dizzy episodes she does take hyzaar for BP.   She complains of left great toenail yellow discoloration and thickness after a pedicure. She has not tried anything to make better.   Insomnia is controlled with as needed ambien.   .. Active Ambulatory Problems    Diagnosis Date Noted  . Essential hypertension, benign 12/01/2012  . Generalized anxiety disorder 12/01/2012  . Family history of colon cancer 12/01/2012  . History of hysterectomy 01/04/2013  . History of shingles 01/04/2013  . Gastritis 02/17/2013  . Class 1 obesity due to excess calories with serious comorbidity and body mass index (BMI) of 30.0 to 30.9 in adult 03/05/2013  . Elevated LFTs 05/11/2013  . Labral tear of shoulder 08/29/2014  . Large breasts 06/26/2016  . Primary insomnia 06/26/2016  . Mid back pain 06/26/2016  . Chronic shoulder pain 06/26/2016  . Fungal infection of toenail 01/17/2017   Resolved Ambulatory Problems    Diagnosis Date Noted  . No Resolved Ambulatory Problems   Past Medical History:  Diagnosis Date  . Anxiety   . Asthma   . Bronchitis   . Essential hypertension, benign 12/01/2012  . Generalized anxiety disorder 12/01/2012  . Hypertension   . Seasonal allergies      Review of Systems  All other systems reviewed and are negative.      Objective:   Physical Exam  Constitutional: She is oriented to person, place, and time. She appears well-developed and well-nourished.  HENT:  Head: Normocephalic and atraumatic.  Cardiovascular: Regular rhythm.   Pulmonary/Chest: Effort normal and  breath sounds normal. She has no wheezes.  Neurological: She is alert and oriented to person, place, and time.  Skin:  Left great toenail yellow and thick at the lateral tip.   Psychiatric: She has a normal mood and affect. Her behavior is normal.          Assessment & Plan:  Marland KitchenMarland KitchenPeggie was seen today for obesity.  Diagnoses and all orders for this visit:  Essential hypertension, benign -     losartan-hydrochlorothiazide (HYZAAR) 100-25 MG tablet; Take 1 tablet by mouth daily.  Class 1 obesity due to excess calories with serious comorbidity and body mass index (BMI) of 30.0 to 30.9 in adult -     Liraglutide -Weight Management (SAXENDA) 18 MG/3ML SOPN; Inject 3 mg into the skin daily. -     buPROPion (WELLBUTRIN XL) 150 MG 24 hr tablet; Take 1 tablet (150 mg total) by mouth every morning.  Primary insomnia -     zolpidem (AMBIEN) 5 MG tablet; Take 1 tablet (5 mg total) by mouth at bedtime as needed for sleep.  Generalized anxiety disorder -     buPROPion (WELLBUTRIN XL) 150 MG 24 hr tablet; Take 1 tablet (150 mg total) by mouth every morning.  Fungal infection of toenail -     ciclopirox (PENLAC) 8 % solution; Apply topically at bedtime. Apply over nail and surrounding skin. Apply daily over previous coat. After seven (7) days, may remove with alcohol  and continue cycle.   BP low start cutting hyzaar in half. Keep checking BP. If continues to be low call office and may discontinue.   Refilled saxenda. Doing great. Continue with exercise and diet changes. Follow up in 3 months.   Discussed toenail fungus. Treated with penlac.    Will get labs at CPE at next visit.

## 2017-04-14 ENCOUNTER — Other Ambulatory Visit: Payer: Self-pay | Admitting: Physician Assistant

## 2017-04-14 DIAGNOSIS — E6609 Other obesity due to excess calories: Secondary | ICD-10-CM

## 2017-04-14 DIAGNOSIS — Z6830 Body mass index (BMI) 30.0-30.9, adult: Principal | ICD-10-CM

## 2017-04-17 ENCOUNTER — Emergency Department
Admission: EM | Admit: 2017-04-17 | Discharge: 2017-04-17 | Disposition: A | Discharge status: Home / Self Care | Payer: BLUE CROSS/BLUE SHIELD | Source: Home / Self Care

## 2017-04-17 ENCOUNTER — Telehealth: Payer: Self-pay | Admitting: Physician Assistant

## 2017-04-17 ENCOUNTER — Encounter: Payer: Self-pay | Admitting: *Deleted

## 2017-04-17 DIAGNOSIS — J4521 Mild intermittent asthma with (acute) exacerbation: Secondary | ICD-10-CM | POA: Diagnosis not present

## 2017-04-17 DIAGNOSIS — J4520 Mild intermittent asthma, uncomplicated: Secondary | ICD-10-CM

## 2017-04-17 MED ORDER — PREDNISONE 10 MG (21) PO TBPK: ORAL_TABLET | Freq: Every day | ORAL | 0 refills | Status: DC

## 2017-04-17 MED ORDER — ALBUTEROL SULFATE HFA 108 (90 BASE) MCG/ACT IN AERS: 2.0000 | INHALATION_SPRAY | RESPIRATORY_TRACT | 0 refills | Status: DC | PRN

## 2017-04-17 NOTE — ED Provider Notes (Signed)
[name]    CSN: [id number] Arrival date & time: 04/17/17  1729     History   Chief Complaint Chief Complaint  Patient presents with  . Shortness of Breath    HPI Debbie [name] is a 56 y.o. female.   55 year old female with history of seasonal allergies, hypertension, asthma, comes in with 2 week history of shortness of breath. Patient states she has been cleaning her basement recently that has mold due to moisture. She states she had allergy testing in the past the indicated that she was allergic to mold. She has had wheezing and shortness of breath since then with dry cough. She has needed increased use of her inhaler, with coughing worse at night. Denies fever, chills, night sweats. Denies trouble breathing, trouble swallowing, swelling of the throat. Denies chest pain, palpitations. Denies other URI symptoms such as sore throat, nasal congestion, ear pain, eye pain.      Past Medical History:  Diagnosis Date  . Anxiety   . Asthma    with severe URIs  . Bronchitis   . Essential hypertension, benign 12/01/2012  . Generalized anxiety disorder 12/01/2012  . Hypertension   . Seasonal allergies     Patient Active Problem List   Diagnosis Date Noted  . Fungal infection of toenail 01/17/2017  . Large breasts 06/26/2016  . Primary insomnia 06/26/2016  . Mid back pain 06/26/2016  . Chronic shoulder pain 06/26/2016  . Labral tear of shoulder 08/29/2014  . Elevated LFTs 05/11/2013  . Class 1 obesity due to excess calories with serious comorbidity and body mass index (BMI) of 30.0 to 30.9 in adult 03/05/2013  . Gastritis 02/17/2013  . History of hysterectomy 01/04/2013  . History of shingles 01/04/2013  . Essential hypertension, benign 12/01/2012  . Generalized anxiety disorder 12/01/2012  . Family history of colon cancer 12/01/2012    Past Surgical History:  Procedure Laterality Date  . ABDOMINAL HYSTERECTOMY  2011  . ANKLE SURGERY    .  CHOLECYSTECTOMY  2012  . KNEE ARTHROSCOPY W/ MEDIAL COLLATERAL LIGAMENT (MCL) REPAIR Left 10-07-2012  . TONSILLECTOMY  1969    OB History    No data available       Home Medications    Prior to Admission medications   Medication Sig Start Date End Date Taking? Authorizing Provider  albuterol (PROVENTIL HFA;VENTOLIN HFA) 108 (90 Base) MCG/ACT inhaler Inhale 2 puffs into the lungs every 4 (four) hours as needed for wheezing. 04/17/17   Tasia Catchings, Katrisha Segall V, PA-C  buPROPion (WELLBUTRIN XL) 150 MG 24 hr tablet Take 1 tablet (150 mg total) by mouth every morning. 01/17/17   Breeback, Jade L, PA-C  ciclopirox (PENLAC) 8 % solution Apply topically at bedtime. Apply over nail and surrounding skin. Apply daily over previous coat. After seven (7) days, may remove with alcohol and continue cycle. 01/17/17   Breeback, Jade L, PA-C  desonide (DESOWEN) 0.05 % cream Apply topically 2 (two) times daily. Use for up to two weeks. 10/10/16   Breeback, Royetta Car, PA-C  losartan-hydrochlorothiazide (HYZAAR) 100-25 MG tablet Take 1 tablet by mouth daily. 01/17/17   Breeback, Jade L, PA-C  pantoprazole (PROTONIX) 40 MG tablet Take 1 tablet (40 mg total) by mouth daily. 06/26/16   Breeback, Jade L, PA-C  predniSONE (STERAPRED UNI-PAK 21 TAB) 10 MG (21) TBPK tablet Take by mouth daily. Take 6 tabs by mouth day 1, then 5 tabs, then 4 tabs, then 3 tabs, 2 tabs, then  1 tab for the last day 04/17/17   Ok Edwards, PA-C  SAXENDA 18 MG/3ML SOPN INJECT 3 MG UNDER THE SKIN DAILY 04/16/17   Iran Planas L, PA-C  valACYclovir (VALTREX) 1000 MG tablet TAKE 2 TABLETS EVERY 12 HOURS FOR ONE DAY, TAKE AT THE ONSET OF COLD SORE 08/20/16   Breeback, Jade L, PA-C  zolpidem (AMBIEN) 5 MG tablet Take 1 tablet (5 mg total) by mouth at bedtime as needed for sleep. 01/17/17   Donella Stade, PA-C    Family History Family History  Problem Relation Age of Onset  . Hypertension Mother   . Diabetes Father        maternal granndmother  . Hypertension Father   .  Alcoholism Father   . Colon cancer Father   . Lung cancer Father   . Depression Father   . Hypertension Sister   . Hypertension Brother   . Hyperlipidemia Maternal Grandfather   . Stroke Paternal Grandmother     Social History Social History  Substance Use Topics  . Smoking status: Never Smoker  . Smokeless tobacco: Never Used  . Alcohol use Yes     Comment: 2 q mth     Allergies   Erythromycin; Levofloxacin; and Penicillins   Review of Systems Review of Systems  Reason unable to perform ROS: See HPI as above.     Physical Exam Triage Vital Signs ED Triage Vitals  Enc Vitals Group     BP 04/17/17 1745 101/72     Pulse Rate 04/17/17 1745 87     Resp 04/17/17 1745 16     Temp 04/17/17 1745 98.4 F (36.9 C)     Temp Source 04/17/17 1745 Oral     SpO2 04/17/17 1745 97 %     Weight 04/17/17 1746 183 lb (83 kg)     Height 04/17/17 1746 5\' 6"  (1.676 m)     Head Circumference --      Peak Flow --      Pain Score 04/17/17 1746 0     Pain Loc --      Pain Edu? --      Excl. in [place]? --    No data found.   Updated Vital Signs BP 101/72 (BP Location: Left Arm)   Pulse 87   Temp 98.4 F (36.9 C) (Oral)   Resp 16   Ht 5\' 6"  (1.676 m)   Wt 183 lb (83 kg)   SpO2 97%   BMI 29.54 kg/m    Physical Exam  Constitutional: She is oriented to person, place, and time. She appears well-developed and well-nourished. No distress.  Eyes: Pupils are equal, round, and reactive to light. Conjunctivae and EOM are normal.  Neck: Normal range of motion. Neck supple.  Cardiovascular: Normal rate, regular rhythm and normal heart sounds.  Exam reveals no gallop and no friction rub.   No murmur heard. Pulmonary/Chest: Effort normal and breath sounds normal. No respiratory distress. She has no wheezes.  Lymphadenopathy:    She has no cervical adenopathy.  Neurological: She is alert and oriented to person, place, and time.  Skin: Skin is warm and dry.     UC Treatments / Results    Labs (all labs ordered are listed, but only abnormal results are displayed) Labs Reviewed - No data to display  EKG  EKG Interpretation None       Radiology No results found.  Procedures Procedures (including critical care time)  Medications Ordered in  UC Medications - No data to display   Initial Impression / Assessment and Plan / UC Course  I have reviewed the triage vital signs and the nursing notes.  Pertinent labs & imaging results that were available during my care of the patient were reviewed by me and considered in my medical decision making (see chart for details).    Patient without wheezing on exam, O2 sat 97% at room air. Start prednisone as directed. Continue inhaler as needed. Return precautions given.   Final Clinical Impressions(s) / UC Diagnoses   Final diagnoses:  Mild intermittent asthma with exacerbation    New Prescriptions Discharge Medication List as of 04/17/2017  5:54 PM    START taking these medications   Details  predniSONE (STERAPRED UNI-PAK 21 TAB) 10 MG (21) TBPK tablet Take by mouth daily. Take 6 tabs by mouth day 1, then 5 tabs, then 4 tabs, then 3 tabs, 2 tabs, then 1 tab for the last day, Starting Thu 04/17/2017, Normal          Ok Edwards, PA-C 04/17/17 1935

## 2017-04-17 NOTE — ED Triage Notes (Signed)
Pt c/o SOB x 2 wks. She reports some coughing that taste "metalic". She has been cleaning mold from her basement recently.

## 2017-04-17 NOTE — Telephone Encounter (Signed)
Received fax form for PA on saxenda filled out form and faxed to [facility] for re auth of medication. Waiting on determination. - CF

## 2017-04-17 NOTE — Discharge Instructions (Signed)
Start prednisone as directed. Continue albuterol as needed. Continue allergy medication. Monitor for any worsening of symptoms, trouble breathing, trouble swallowing, wheezing not controlled, to follow up for reevaluation.

## 2017-04-18 NOTE — Telephone Encounter (Signed)
Received fax from [facility] for PA on Levalbuterol stating it is under review and if additional information is needed they will contact us. A decision can take up to 15 calendar days.   Case ID [id number]

## 2017-05-16 ENCOUNTER — Ambulatory Visit (INDEPENDENT_AMBULATORY_CARE_PROVIDER_SITE_OTHER): Payer: BLUE CROSS/BLUE SHIELD | Admitting: Physician Assistant

## 2017-05-16 ENCOUNTER — Encounter: Payer: Self-pay | Admitting: Physician Assistant

## 2017-05-16 VITALS — BP 115/63 | HR 87 | Temp 98.6°F | Ht 65.25 in | Wt 188.0 lb

## 2017-05-16 DIAGNOSIS — Z6830 Body mass index (BMI) 30.0-30.9, adult: Secondary | ICD-10-CM

## 2017-05-16 DIAGNOSIS — J4521 Mild intermittent asthma with (acute) exacerbation: Secondary | ICD-10-CM | POA: Diagnosis not present

## 2017-05-16 DIAGNOSIS — E6609 Other obesity due to excess calories: Secondary | ICD-10-CM | POA: Diagnosis not present

## 2017-05-16 MED ORDER — DOXYCYCLINE HYCLATE 100 MG PO TABS: 100.0000 mg | ORAL_TABLET | Freq: Two times a day (BID) | ORAL | 0 refills | Status: DC

## 2017-05-16 MED ORDER — LIRAGLUTIDE -WEIGHT MANAGEMENT 18 MG/3ML ~~LOC~~ SOPN: 0.6000 mg | PEN_INJECTOR | Freq: Every day | SUBCUTANEOUS | 1 refills | Status: DC

## 2017-05-16 MED ORDER — PREDNISONE 20 MG PO TABS: ORAL_TABLET | ORAL | 0 refills | Status: DC

## 2017-05-18 ENCOUNTER — Encounter: Payer: Self-pay | Admitting: Physician Assistant
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
## 2017-06-09 ENCOUNTER — Other Ambulatory Visit: Payer: Self-pay | Admitting: *Deleted

## 2017-06-09 DIAGNOSIS — J4520 Mild intermittent asthma, uncomplicated: Secondary | ICD-10-CM

## 2017-06-09 MED ORDER — ALBUTEROL SULFATE HFA 108 (90 BASE) MCG/ACT IN AERS: 2.0000 | INHALATION_SPRAY | RESPIRATORY_TRACT | 0 refills | Status: DC | PRN

## 2017-07-08 ENCOUNTER — Encounter: Payer: Self-pay | Admitting: Physician Assistant

## 2017-07-08 ENCOUNTER — Ambulatory Visit (INDEPENDENT_AMBULATORY_CARE_PROVIDER_SITE_OTHER): Payer: BLUE CROSS/BLUE SHIELD

## 2017-07-08 ENCOUNTER — Ambulatory Visit (INDEPENDENT_AMBULATORY_CARE_PROVIDER_SITE_OTHER): Payer: BLUE CROSS/BLUE SHIELD | Admitting: Physician Assistant

## 2017-07-08 VITALS — BP 136/80 | HR 77 | Ht 65.25 in | Wt 193.0 lb

## 2017-07-08 DIAGNOSIS — F411 Generalized anxiety disorder: Secondary | ICD-10-CM | POA: Diagnosis not present

## 2017-07-08 DIAGNOSIS — Z13220 Encounter for screening for lipoid disorders: Secondary | ICD-10-CM | POA: Diagnosis not present

## 2017-07-08 DIAGNOSIS — R0602 Shortness of breath: Secondary | ICD-10-CM

## 2017-07-08 DIAGNOSIS — R05 Cough: Secondary | ICD-10-CM

## 2017-07-08 DIAGNOSIS — R059 Cough, unspecified: Secondary | ICD-10-CM

## 2017-07-08 DIAGNOSIS — R0789 Other chest pain: Secondary | ICD-10-CM

## 2017-07-08 DIAGNOSIS — I10 Essential (primary) hypertension: Secondary | ICD-10-CM | POA: Diagnosis not present

## 2017-07-08 DIAGNOSIS — Z0000 Encounter for general adult medical examination without abnormal findings: Secondary | ICD-10-CM | POA: Diagnosis not present

## 2017-07-08 DIAGNOSIS — Z6830 Body mass index (BMI) 30.0-30.9, adult: Secondary | ICD-10-CM | POA: Diagnosis not present

## 2017-07-08 DIAGNOSIS — F5101 Primary insomnia: Secondary | ICD-10-CM | POA: Diagnosis not present

## 2017-07-08 DIAGNOSIS — Z131 Encounter for screening for diabetes mellitus: Secondary | ICD-10-CM

## 2017-07-08 DIAGNOSIS — E6609 Other obesity due to excess calories: Secondary | ICD-10-CM | POA: Diagnosis not present

## 2017-07-08 MED ORDER — BUPROPION HCL ER (XL) 150 MG PO TB24: 150.0000 mg | ORAL_TABLET | ORAL | 1 refills | Status: DC

## 2017-07-08 MED ORDER — MONTELUKAST SODIUM 10 MG PO TABS: 10.0000 mg | ORAL_TABLET | Freq: Every day | ORAL | 3 refills | Status: DC

## 2017-07-08 MED ORDER — LOSARTAN POTASSIUM-HCTZ 100-25 MG PO TABS: 1.0000 | ORAL_TABLET | Freq: Every day | ORAL | 1 refills | Status: DC

## 2017-07-08 MED ORDER — ZOLPIDEM TARTRATE 5 MG PO TABS: 5.0000 mg | ORAL_TABLET | Freq: Every evening | ORAL | 1 refills | Status: DC | PRN

## 2017-07-08 NOTE — Progress Notes (Signed)
Subjective:    Patient ID: Debbie [name], female    DOB: 06/17/1961, 56 y.o.   MRN: [id number]  HPI See below.  Review of Systems     Objective:   Physical Exam        Assessment & Plan:   Subjective:     [place] is a 57 y.o. female and is here for a comprehensive physical exam. The patient reports problems - see below.    Patient does report some ongoing cough, feeling of shortness of breath, chest tightness.  She has had 2 exacerbations since August.  She received antibiotic in 2 courses of prednisone.  She thought it was due to mold in her home.  They have resolved this issue.  On average she is using her albuterol inhaler 3-4 times a day.  She does have a history of asthma.  She denies any fever, chills, body aches.  She is not on a daily ICS inhaler.  She does take Claritin as needed.  Social History   Socioeconomic History  . Marital status: Married    Spouse name: Not on file  . Number of children: Not on file  . Years of education: Not on file  . Highest education level: Not on file  Social Needs  . Financial resource strain: Not on file  . Food insecurity - worry: Not on file  . Food insecurity - inability: Not on file  . Transportation needs - medical: Not on file  . Transportation needs - non-medical: Not on file  Occupational History  . Not on file  Tobacco Use  . Smoking status: Never Smoker  . Smokeless tobacco: Never Used  Substance and Sexual Activity  . Alcohol use: Yes    Comment: 2 q mth  . Drug use: No  . Sexual activity: Not Currently  Other Topics Concern  . Not on file  Social History Narrative  . Not on file   Health Maintenance  Topic Date Due  . MAMMOGRAM  03/03/2017  . HIV Screening  07/09/2018 (Originally 05/15/1975)  . TETANUS/TDAP  03/20/2022  . COLONOSCOPY  04/20/2025  . INFLUENZA VACCINE  Completed  . Hepatitis C Screening  Completed  . PAP SMEAR  Discontinued    The following portions of the patient's  history were reviewed and updated as appropriate: allergies, current medications, past family history, past medical history, past social history, past surgical history and problem list.  Review of Systems Pertinent items noted in HPI and remainder of comprehensive ROS otherwise negative.   Objective:    BP 136/80   Pulse 77   Ht 5' 5.25" (1.657 m)   Wt 193 lb (87.5 kg)   BMI 31.87 kg/m  General appearance: alert, cooperative and appears stated age Head: Normocephalic, without obvious abnormality, atraumatic Eyes: conjunctivae/corneas clear. PERRL, EOM's intact. Fundi benign. Ears: normal TM's and external ear canals both ears Nose: Nares normal. Septum midline. Mucosa normal. No drainage or sinus tenderness. Throat: lips, mucosa, and tongue normal; teeth and gums normal Neck: no adenopathy, no carotid bruit, no JVD, supple, symmetrical, trachea midline and thyroid not enlarged, symmetric, no tenderness/mass/nodules Back: symmetric, no curvature. ROM normal. No CVA tenderness. Lungs: clear to auscultation bilaterally Heart: regular rate and rhythm, S1, S2 normal, no murmur, click, rub or gallop Abdomen: soft, non-tender; bowel sounds normal; no masses,  no organomegaly Extremities: extremities normal, atraumatic, no cyanosis or edema Pulses: 2+ and symmetric Skin: Skin color, texture, turgor normal. No rashes or lesions  Lymph nodes: Cervical, supraclavicular, and axillary nodes normal. Neurologic: Alert and oriented X 3, normal strength and tone. Normal symmetric reflexes. Normal coordination and gait    Assessment:    Healthy female exam.      Plan:      Marland KitchenMarland KitchenPeggie was seen today for annual exam.  Diagnoses and all orders for this visit:  Routine physical examination -     Lipid Panel w/reflex Direct LDL -     COMPLETE METABOLIC PANEL WITH GFR -     MM SCREENING BREAST TOMO BILATERAL; Future -     TSH -     Vitamin D 1,25 dihydroxy -     B12  Cough -     DG Chest 2  View -     montelukast (SINGULAIR) 10 MG tablet; Take 1 tablet (10 mg total) by mouth at bedtime.  SOB (shortness of breath) -     DG Chest 2 View -     montelukast (SINGULAIR) 10 MG tablet; Take 1 tablet (10 mg total) by mouth at bedtime.  Chest tightness -     DG Chest 2 View -     montelukast (SINGULAIR) 10 MG tablet; Take 1 tablet (10 mg total) by mouth at bedtime.  Screening for lipid disorders -     Lipid Panel w/reflex Direct LDL  Screening for diabetes mellitus -     COMPLETE METABOLIC PANEL WITH GFR  Essential hypertension, benign -     losartan-hydrochlorothiazide (HYZAAR) 100-25 MG tablet; Take 1 tablet by mouth daily.  Primary insomnia -     zolpidem (AMBIEN) 5 MG tablet; Take 1 tablet (5 mg total) by mouth at bedtime as needed for sleep.  Class 1 obesity due to excess calories with serious comorbidity and body mass index (BMI) of 30.0 to 30.9 in adult -     buPROPion (WELLBUTRIN XL) 150 MG 24 hr tablet; Take 1 tablet (150 mg total) by mouth every morning.  Generalized anxiety disorder -     buPROPion (WELLBUTRIN XL) 150 MG 24 hr tablet; Take 1 tablet (150 mg total) by mouth every morning.      .. Depression screen [facility] 2/9 07/08/2017  Decreased Interest 0  Down, Depressed, Hopeless 0  PHQ - 2 Score 0   .. Discussed 150 minutes of exercise a week.  Encouraged vitamin D 1000 units and Calcium 1300mg  or 4 servings of dairy a day.   No indication for pap.  Mammogram ordered.  Colonoscopy up to date.   Marland Kitchen.Discussed low carb diet with 1500 calories and 80g of protein.  Exercising at least 150 minutes a week.  [employer] could be a Microbiologist.  Pt is on wellbutrin and saxenda.   Pt carries dx of asthma. Symptoms are consistent with asthma and since she improves with albuterol as well. Will get CXR today. Added singulair. Discussed need for ICS inhaler. She declines today. We could also consider new spirometry. Follow up in 4 weeks.   See After Visit  Summary for Counseling Recommendations

## 2017-07-08 NOTE — Patient Instructions (Signed)
cpKeeping You Healthy  Get These Tests  Blood Pressure- Have your blood pressure checked by your healthcare provider at least once a year.  Normal blood pressure is 120/80.  Weight- Have your body mass index (BMI) calculated to screen for obesity.  BMI is a measure of body fat based on height and weight.  You can calculate your own BMI at [employer].it  Cholesterol- Have your cholesterol checked every year.  Diabetes- Have your blood sugar checked every year if you have high blood pressure, high cholesterol, a family history of diabetes or if you are overweight.  Pap Test - Have a pap test every 1 to 5 years if you have been sexually active.  If you are older than 65 and recent pap tests have been normal you may not need additional pap tests.  In addition, if you have had a hysterectomy  for benign disease additional pap tests are not necessary.  Mammogram-Yearly mammograms are essential for early detection of breast cancer  Screening for Colon Cancer- Colonoscopy starting at age 68. Screening may begin sooner depending on your family history and other health conditions.  Follow up colonoscopy as directed by your Gastroenterologist.  Screening for Osteoporosis- Screening begins at age 76 with bone density scanning, sooner if you are at higher risk for developing Osteoporosis.  Get these medicines  Calcium with Vitamin D- Your body requires 1200-1500 mg of Calcium a day and [phone] IU of Vitamin D a day.  You can only absorb 500 mg of Calcium at a time therefore Calcium must be taken in 2 or 3 separate doses throughout the day.  Hormones- Hormone therapy has been associated with increased risk for certain cancers and heart disease.  Talk to your healthcare provider about if you need relief from menopausal symptoms.  Aspirin- Ask your healthcare provider about taking Aspirin to prevent Heart Disease and Stroke.  Get these Immuniztions  Flu shot- Every fall  Pneumonia shot-  Once after the age of 40; if you are younger ask your healthcare provider if you need a pneumonia shot.  Tetanus- Every ten years.  Zostavax- Once after the age of 70 to prevent shingles.  Take these steps  Don't smoke- Your healthcare provider can help you quit. For tips on how to quit, ask your healthcare provider or go to www.smokefree.gov or call 1-800 QUIT-NOW.  Be physically active- Exercise 5 days a week for a minimum of 30 minutes.  If you are not already physically active, start slow and gradually work up to 30 minutes of moderate physical activity.  Try walking, dancing, bike riding, swimming, etc.  Eat a healthy diet- Eat a variety of healthy foods such as fruits, vegetables, whole grains, low fat milk, low fat cheeses, yogurt, lean meats, chicken, fish, eggs, dried beans, tofu, etc.  For more information go to www.thenutritionsource.org  Dental visit- Brush and floss teeth twice daily; visit your dentist twice a year.  Eye exam- Visit your Optometrist or Ophthalmologist yearly.  Drink alcohol in moderation- Limit alcohol intake to one drink or less a day.  Never drink and drive.  Depression- Your emotional health is as important as your physical health.  If you're feeling down or losing interest in things you normally enjoy, please talk to your healthcare provider.  Seat Belts- can save your life; always wear one  Smoke/Carbon Monoxide detectors- These detectors need to be installed on the appropriate level of your home.  Replace batteries at least once a year.  Violence- If  anyone is threatening or hurting you, please tell your healthcare provider. Living Will/ Health care power of attorney- Discuss with your healthcare provider and family.

## 2017-07-09 ENCOUNTER — Ambulatory Visit (INDEPENDENT_AMBULATORY_CARE_PROVIDER_SITE_OTHER): Payer: BLUE CROSS/BLUE SHIELD

## 2017-07-09 DIAGNOSIS — R059 Cough, unspecified: Secondary | ICD-10-CM

## 2017-07-09 DIAGNOSIS — R0602 Shortness of breath: Secondary | ICD-10-CM | POA: Insufficient documentation

## 2017-07-09 DIAGNOSIS — R05 Cough: Secondary | ICD-10-CM | POA: Insufficient documentation

## 2017-07-09 DIAGNOSIS — R0789 Other chest pain: Secondary | ICD-10-CM

## 2017-07-09 DIAGNOSIS — Z1231 Encounter for screening mammogram for malignant neoplasm of breast: Secondary | ICD-10-CM | POA: Diagnosis not present

## 2017-07-09 DIAGNOSIS — Z0000 Encounter for general adult medical examination without abnormal findings: Secondary | ICD-10-CM

## 2017-07-09 HISTORY — DX: Shortness of breath: R06.02

## 2017-07-09 HISTORY — DX: Cough, unspecified: R05.9

## 2017-07-09 HISTORY — DX: Other chest pain: R07.89

## 2017-07-12 LAB — LIPID PANEL W/REFLEX DIRECT LDL
CHOL/HDL RATIO: 2.2 (calc) (ref <5.0)
CHOLESTEROL: 201 mg/dL — AB (ref <200)
HDL: 93 mg/dL (ref >50)
LDL CHOLESTEROL (CALC): 88 mg/dL
Non-HDL Cholesterol (Calc): 108 mg/dL (calc) (ref <130)
Triglycerides: 104 mg/dL (ref <150)

## 2017-07-12 LAB — COMPLETE METABOLIC PANEL WITH GFR
AG RATIO: 2.2 (calc) (ref 1.0–2.5)
ALT: 21 U/L (ref 6–29)
AST: 23 U/L (ref 10–35)
Albumin: 4.6 g/dL (ref 3.6–5.1)
Alkaline phosphatase (APISO): 86 U/L (ref 33–130)
BUN: 16 mg/dL (ref 7–25)
CALCIUM: 9.9 mg/dL (ref 8.6–10.4)
CO2: 33 mmol/L — AB (ref 20–32)
CREATININE: 0.66 mg/dL (ref 0.50–1.05)
Chloride: 104 mmol/L (ref 98–110)
GFR, EST NON AFRICAN AMERICAN: 98 mL/min/{1.73_m2} (ref >=60)
GFR, Est African American: 114 mL/min/{1.73_m2} (ref >=60)
GLOBULIN: 2.1 g/dL (ref 1.9–3.7)
Glucose, Bld: 91 mg/dL (ref 65–99)
POTASSIUM: 3.7 mmol/L (ref 3.5–5.3)
SODIUM: 143 mmol/L (ref 135–146)
Total Bilirubin: 0.7 mg/dL (ref 0.2–1.2)
Total Protein: 6.7 g/dL (ref 6.1–8.1)

## 2017-07-12 LAB — VITAMIN D 1,25 DIHYDROXY
Vitamin D 1, 25 (OH)2 Total: 43 pg/mL (ref 18–72)
Vitamin D2 1, 25 (OH)2: <8 pg/mL
Vitamin D3 1, 25 (OH)2: 43 pg/mL

## 2017-07-12 LAB — VITAMIN B12: Vitamin B-12: 472 pg/mL (ref 200–1100)

## 2017-07-12 LAB — TSH: TSH: 0.94 mIU/L (ref 0.40–4.50)

## 2017-07-15 NOTE — Progress Notes (Signed)
Call pt: normal mammogram. Follow up in 1 year.

## 2017-07-31 DIAGNOSIS — H04123 Dry eye syndrome of bilateral lacrimal glands: Secondary | ICD-10-CM | POA: Diagnosis not present

## 2017-08-06 ENCOUNTER — Emergency Department
Admission: EM | Admit: 2017-08-06 | Discharge: 2017-08-06 | Disposition: A | Discharge status: Home / Self Care | Payer: BLUE CROSS/BLUE SHIELD | Source: Home / Self Care | Attending: Family Medicine | Admitting: Family Medicine

## 2017-08-06 ENCOUNTER — Encounter: Payer: Self-pay | Admitting: *Deleted

## 2017-08-06 ENCOUNTER — Emergency Department (INDEPENDENT_AMBULATORY_CARE_PROVIDER_SITE_OTHER): Payer: BLUE CROSS/BLUE SHIELD

## 2017-08-06 ENCOUNTER — Other Ambulatory Visit: Payer: Self-pay

## 2017-08-06 DIAGNOSIS — J9801 Acute bronchospasm: Secondary | ICD-10-CM

## 2017-08-06 DIAGNOSIS — R05 Cough: Secondary | ICD-10-CM

## 2017-08-06 DIAGNOSIS — R053 Chronic cough: Secondary | ICD-10-CM

## 2017-08-06 MED ORDER — BENZONATATE 200 MG PO CAPS: ORAL_CAPSULE | ORAL | 0 refills | Status: DC

## 2017-08-06 MED ORDER — PREDNISONE 20 MG PO TABS: ORAL_TABLET | ORAL | 0 refills | Status: DC

## 2017-08-06 MED ORDER — IPRATROPIUM-ALBUTEROL 0.5-2.5 (3) MG/3ML IN SOLN
3.0000 mL | Freq: Once | RESPIRATORY_TRACT | Status: AC
  Administered 2017-08-06: 3 mL via RESPIRATORY_TRACT

## 2017-08-06 MED ORDER — METHYLPREDNISOLONE SODIUM SUCC 125 MG IJ SOLR
80.0000 mg | Freq: Once | INTRAMUSCULAR | Status: AC
  Administered 2017-08-06: 80 mg via INTRAMUSCULAR

## 2017-08-06 NOTE — ED Provider Notes (Signed)
[name]    CSN: [id number] Arrival date & time: 08/06/17  1350     History   Chief Complaint Chief Complaint  Patient presents with  . Cough    HPI Debbie [name] is a 56 y.o. female.   Patient developed a URI approximately 2 weeks ago.  She complains of persistent non-productive cough and occasional wheezing.  She is presently finishing a course of doxycycline.  She was started on Singulair by her PCP on 07/08/17, but has refused ICS therapy.   The history is provided by the patient.    Past Medical History:  Diagnosis Date  . Anxiety   . Asthma    with severe URIs  . Bronchitis   . Essential hypertension, benign 12/01/2012  . Generalized anxiety disorder 12/01/2012  . Hypertension   . Seasonal allergies     Patient Active Problem List   Diagnosis Date Noted  . Chest tightness 07/09/2017  . SOB (shortness of breath) 07/09/2017  . Cough 07/09/2017  . Fungal infection of toenail 01/17/2017  . Large breasts 06/26/2016  . Primary insomnia 06/26/2016  . Mid back pain 06/26/2016  . Chronic shoulder pain 06/26/2016  . Labral tear of shoulder 08/29/2014  . Elevated LFTs 05/11/2013  . Class 1 obesity due to excess calories with serious comorbidity and body mass index (BMI) of 30.0 to 30.9 in adult 03/05/2013  . Gastritis 02/17/2013  . History of hysterectomy 01/04/2013  . History of shingles 01/04/2013  . Essential hypertension, benign 12/01/2012  . Generalized anxiety disorder 12/01/2012  . Family history of colon cancer 12/01/2012    Past Surgical History:  Procedure Laterality Date  . ABDOMINAL HYSTERECTOMY  2011  . ANKLE SURGERY    . BREAST CYST EXCISION Bilateral   . BREAST EXCISIONAL BIOPSY Bilateral   . CHOLECYSTECTOMY  2012  . KNEE ARTHROSCOPY W/ MEDIAL COLLATERAL LIGAMENT (MCL) REPAIR Left 10-07-2012  . TONSILLECTOMY  1969    OB History    No data available       Home Medications    Prior to Admission medications     Medication Sig Start Date End Date Taking? Authorizing Provider  albuterol (PROVENTIL HFA;VENTOLIN HFA) 108 (90 Base) MCG/ACT inhaler Inhale 2 puffs into the lungs every 4 (four) hours as needed for wheezing. 06/09/17   Breeback, Jade L, PA-C  benzonatate (TESSALON) 200 MG capsule Take one cap by mouth at bedtime as needed for cough.  May repeat in 4 to 6 hours 08/06/17   Kandra Nicolas, MD  buPROPion (WELLBUTRIN XL) 150 MG 24 hr tablet Take 1 tablet (150 mg total) by mouth every morning. 07/08/17   Breeback, Jade L, PA-C  desonide (DESOWEN) 0.05 % cream Apply topically 2 (two) times daily. Use for up to two weeks. 10/10/16   Donella Stade, PA-C  Liraglutide -Weight Management (SAXENDA) 18 MG/3ML SOPN Inject 0.6 mg into the skin daily. For one week then increase by .6mg  weekly until reaches 3mg  daily.  Please include ultra fine needles 49mm 05/16/17   Breeback, Jade L, PA-C  losartan-hydrochlorothiazide (HYZAAR) 100-25 MG tablet Take 1 tablet by mouth daily. 07/08/17   Breeback, Jade L, PA-C  montelukast (SINGULAIR) 10 MG tablet Take 1 tablet (10 mg total) by mouth at bedtime. 07/08/17   Breeback, Jade L, PA-C  pantoprazole (PROTONIX) 40 MG tablet Take 1 tablet (40 mg total) by mouth daily. 06/26/16   Breeback, Jade L, PA-C  predniSONE (DELTASONE) 20 MG tablet Take one tab  by mouth twice daily for 4 days, then one daily for 3 days. Take with food. 08/06/17   Kandra Nicolas, MD  valACYclovir (VALTREX) 1000 MG tablet TAKE 2 TABLETS EVERY 12 HOURS FOR ONE DAY, TAKE AT THE ONSET OF COLD SORE 08/20/16   Breeback, Jade L, PA-C  zolpidem (AMBIEN) 5 MG tablet Take 1 tablet (5 mg total) by mouth at bedtime as needed for sleep. 07/08/17   Donella Stade, PA-C    Family History Family History  Problem Relation Age of Onset  . Hypertension Mother   . Diabetes Father        maternal granndmother  . Hypertension Father   . Alcoholism Father   . Colon cancer Father   . Lung cancer Father   .  Depression Father   . Hypertension Sister   . Hypertension Brother   . Hyperlipidemia Maternal Grandfather   . Stroke Paternal Grandmother     Social History Social History   Tobacco Use  . Smoking status: Never Smoker  . Smokeless tobacco: Never Used  Substance Use Topics  . Alcohol use: Yes    Comment: 2 q mth  . Drug use: No     Allergies   Erythromycin; Levofloxacin; and Penicillins   Review of Systems Review of Systems No sore throat + cough No pleuritic pain ? wheezing + nasal congestion + post-nasal drainage No sinus pain/pressure No itchy/red eyes No earache No hemoptysis No SOB No fever/chills No nausea No vomiting No abdominal pain No diarrhea No urinary symptoms No skin rash + fatigue No myalgias No headache Used OTC meds without relief   Physical Exam Triage Vital Signs ED Triage Vitals  Enc Vitals Group     BP 08/06/17 1412 129/83     Pulse Rate 08/06/17 1412 92     Resp 08/06/17 1412 18     Temp 08/06/17 1412 98.6 F (37 C)     Temp Source 08/06/17 1412 Oral     SpO2 08/06/17 1412 96 %     Weight 08/06/17 1412 193 lb (87.5 kg)     Height 08/06/17 1412 5\' 6"  (1.676 m)     Head Circumference --      Peak Flow --      Pain Score 08/06/17 1413 0     Pain Loc --      Pain Edu? --      Excl. in [place]? --    No data found.  Updated Vital Signs BP 129/83 (BP Location: Left Arm)   Pulse 92   Temp 98.6 F (37 C) (Oral)   Resp 18   Ht 5\' 6"  (1.676 m)   Wt 193 lb (87.5 kg)   SpO2 96%   BMI 31.15 kg/m   Visual Acuity Right Eye Distance:   Left Eye Distance:   Bilateral Distance:    Right Eye Near:   Left Eye Near:    Bilateral Near:     Physical Exam Nursing notes and Vital Signs reviewed. Appearance:  Patient appears stated age, and in no acute distress Eyes:  Pupils are equal, round, and reactive to light and accomodation.  Extraocular movement is intact.  Conjunctivae are not inflamed  Ears:  Canals normal.  Tympanic  membranes normal.  Nose:  Mildly congested turbinates.  No sinus tenderness.    Pharynx:  Normal Neck:  Supple.  Enlarged posterior/lateral nodes are palpated bilaterally, tender to palpation on the left.   Lungs:   Bibasilar expiratory wheezes  heard.  Breath sounds are equal.  Moving air well. Heart:  Regular rate and rhythm without murmurs, rubs, or gallops.  Abdomen:  Nontender without masses or hepatosplenomegaly.  Bowel sounds are present.  No CVA or flank tenderness.  Extremities:  No edema.  Skin:  No rash present.    UC Treatments / Results  Labs (all labs ordered are listed, but only abnormal results are displayed) Labs Reviewed - No data to display  EKG  EKG Interpretation None       Radiology Dg Chest 2 View  Result Date: 08/06/2017 CLINICAL DATA:  Cough since 07/24/2017. EXAM: CHEST  2 VIEW COMPARISON:  PA and lateral chest 07/08/2017. FINDINGS: The lungs are clear. Heart size is normal. No pneumothorax or pleural effusion. No acute bony abnormality. IMPRESSION: Negative chest. Electronically Signed   By: Inge Rise M.D.   On: 08/06/2017 15:20    Procedures Procedures (including critical care time)  Medications Ordered in UC Medications  ipratropium-albuterol (DUONEB) 0.5-2.5 (3) MG/3ML nebulizer solution 3 mL (3 mLs Nebulization Given 08/06/17 1501)  methylPREDNISolone sodium succinate (SOLU-MEDROL) 125 mg/2 mL injection 80 mg (80 mg Intramuscular Given 08/06/17 1536)     Initial Impression / Assessment and Plan / UC Course  I have reviewed the triage vital signs and the nursing notes.  Pertinent labs & imaging results that were available during my care of the patient were reviewed by me and considered in my medical decision making (see chart for details).    Administered DuoNeb by hand held nebulizer  Prescription written for Benzonatate (Tessalon) to take at bedtime for night-time cough.  Administered Solumedrol 80mg  IM Begin prednisone burst/taper  on Thursday 08/07/17. Continue doxycycline.  Continue Singulair. Continue albuterol inhaler as needed. Take plain guaifenesin (1200mg  extended release tabs such as Mucinex) twice daily, with plenty of water, for cough and congestion.  May add Pseudoephedrine (30mg , one or two every 4 to 6 hours) for sinus congestion.  Get adequate rest.   May use Afrin nasal spray (or generic oxymetazoline) each morning for about 5 days and then discontinue.  Also recommend using saline nasal spray several times daily and saline nasal irrigation (AYR is a common brand).   Try warm salt water gargles for sore throat.  Stop all antihistamines for now, and other non-prescription cough/cold preparations. Followup with Family Doctor if not improved in 7 to 10 days.    Final Clinical Impressions(s) / UC Diagnoses   Final diagnoses:  Persistent cough  Bronchospasm    ED Discharge Orders        Ordered    predniSONE (DELTASONE) 20 MG tablet     08/06/17 1534    benzonatate (TESSALON) 200 MG capsule     08/06/17 1534          Kandra Nicolas, MD 08/07/17 2238

## 2017-08-06 NOTE — ED Triage Notes (Signed)
Pt c/o nonproductive cough x 07/24/17. She feels as though her other URI s/s are resolving. Denies fever.

## 2017-08-06 NOTE — Discharge Instructions (Signed)
Begin prednisone on Thursday 08/07/17. Continue doxycycline.  Continue Singulair. Continue albuterol inhaler as needed. Take plain guaifenesin (1200mg  extended release tabs such as Mucinex) twice daily, with plenty of water, for cough and congestion.  May add Pseudoephedrine (30mg , one or two every 4 to 6 hours) for sinus congestion.  Get adequate rest.   May use Afrin nasal spray (or generic oxymetazoline) each morning for about 5 days and then discontinue.  Also recommend using saline nasal spray several times daily and saline nasal irrigation (AYR is a common brand).   Try warm salt water gargles for sore throat.  Stop all antihistamines for now, and other non-prescription cough/cold preparations.

## 2017-09-25 ENCOUNTER — Other Ambulatory Visit: Payer: Self-pay | Admitting: Physician Assistant

## 2017-09-25 DIAGNOSIS — E6609 Other obesity due to excess calories: Secondary | ICD-10-CM

## 2017-09-25 DIAGNOSIS — Z6830 Body mass index (BMI) 30.0-30.9, adult: Principal | ICD-10-CM

## 2017-09-25 DIAGNOSIS — F411 Generalized anxiety disorder: Secondary | ICD-10-CM

## 2017-10-20 ENCOUNTER — Ambulatory Visit: Payer: BLUE CROSS/BLUE SHIELD | Admitting: Physician Assistant

## 2017-10-20 ENCOUNTER — Encounter: Payer: Self-pay | Admitting: Physician Assistant

## 2017-10-20 VITALS — BP 135/70 | HR 79 | Ht 66.0 in | Wt 188.0 lb

## 2017-10-20 DIAGNOSIS — R5383 Other fatigue: Secondary | ICD-10-CM

## 2017-10-20 DIAGNOSIS — J4520 Mild intermittent asthma, uncomplicated: Secondary | ICD-10-CM

## 2017-10-20 DIAGNOSIS — I10 Essential (primary) hypertension: Secondary | ICD-10-CM | POA: Diagnosis not present

## 2017-10-20 DIAGNOSIS — R0683 Snoring: Secondary | ICD-10-CM

## 2017-10-20 DIAGNOSIS — G478 Other sleep disorders: Secondary | ICD-10-CM

## 2017-10-20 DIAGNOSIS — Z6830 Body mass index (BMI) 30.0-30.9, adult: Secondary | ICD-10-CM | POA: Diagnosis not present

## 2017-10-20 DIAGNOSIS — E6609 Other obesity due to excess calories: Secondary | ICD-10-CM | POA: Diagnosis not present

## 2017-10-20 DIAGNOSIS — N816 Rectocele: Secondary | ICD-10-CM | POA: Insufficient documentation

## 2017-10-20 HISTORY — DX: Snoring: R06.83

## 2017-10-20 HISTORY — DX: Rectocele: N81.6

## 2017-10-20 HISTORY — DX: Other sleep disorders: G47.8

## 2017-10-20 HISTORY — DX: Other fatigue: R53.83

## 2017-10-20 MED ORDER — LIRAGLUTIDE -WEIGHT MANAGEMENT 18 MG/3ML ~~LOC~~ SOPN: 0.6000 mg | PEN_INJECTOR | Freq: Every day | SUBCUTANEOUS | 2 refills | Status: DC

## 2017-10-20 MED ORDER — ALBUTEROL SULFATE HFA 108 (90 BASE) MCG/ACT IN AERS: 2.0000 | INHALATION_SPRAY | RESPIRATORY_TRACT | 0 refills | Status: DC | PRN

## 2017-10-20 MED ORDER — VALACYCLOVIR HCL 1 G PO TABS: ORAL_TABLET | ORAL | 1 refills | Status: DC

## 2017-10-20 NOTE — Patient Instructions (Signed)

## 2017-10-20 NOTE — Progress Notes (Signed)
Subjective:    Patient ID: Debbie [name], female    DOB: 03/14/1961, 57 y.o.   MRN: [id number]  HPI  Patient is a 56 year old obese female with hypertension, asthma, insomnia who presents to the clinic to discuss snoring/nonrestorative sleep.  Patient has suffered with insomnia for many years.  For the past 2 years she has been snoring more and more.  She admits to waking up not feeling rested.  She is attempting to lose weight but it has not seemed to help her sleep habits.  She is interested in a sleep study.  Patient had previously been on Saxenda to help her lose weight.  It was working very well.  She got sick and stopped it.  She would like to restart it today.  She is ready to work on her diet and lifestyle changes as well.  She does need some refills on her rescue inhaler.  She uses this very rarely.  She has noticed a bulge in her vaginal area.  She has had a partial hysterectomy with only her ovaries left.  The bulge does not hurt.  She denies any dysuria, urinary pressure.  She does have occasional leakage.  She does wear a thin panty liner but 90% of the time it is dry at the end of the day.  Patient denies any pain with bowel movements.  .. Active Ambulatory Problems    Diagnosis Date Noted  . Essential hypertension, benign 12/01/2012  . Generalized anxiety disorder 12/01/2012  . Family history of colon cancer 12/01/2012  . History of hysterectomy 01/04/2013  . History of shingles 01/04/2013  . Gastritis 02/17/2013  . Class 1 obesity due to excess calories with serious comorbidity and body mass index (BMI) of 30.0 to 30.9 in adult 03/05/2013  . Elevated LFTs 05/11/2013  . Labral tear of shoulder 08/29/2014  . Large breasts 06/26/2016  . Primary insomnia 06/26/2016  . Mid back pain 06/26/2016  . Chronic shoulder pain 06/26/2016  . Fungal infection of toenail 01/17/2017  . Chest tightness 07/09/2017  . SOB (shortness of breath) 07/09/2017  . Cough 07/09/2017  .  Snoring 10/20/2017  . Rectocele 10/20/2017  . Non-restorative sleep 10/20/2017  . No energy 10/20/2017  . Reactive airway disease, mild intermittent, uncomplicated 38/25/0539   Resolved Ambulatory Problems    Diagnosis Date Noted  . No Resolved Ambulatory Problems   Past Medical History:  Diagnosis Date  . Anxiety   . Asthma   . Bronchitis   . Essential hypertension, benign 12/01/2012  . Generalized anxiety disorder 12/01/2012  . Hypertension   . Seasonal allergies       Review of Systems  All other systems reviewed and are negative.      Objective:   Physical Exam  Constitutional: She is oriented to person, place, and time. She appears well-developed and well-nourished.  HENT:  Head: Normocephalic and atraumatic.  Neck: Normal range of motion. Neck supple. No thyromegaly present.  Cardiovascular: Normal rate, regular rhythm and normal heart sounds.  Pulmonary/Chest: Effort normal and breath sounds normal. She has no wheezes.  Genitourinary:  Genitourinary Comments: Grade II rectocele seen at entrance of introitus.   Neurological: She is alert and oriented to person, place, and time.  Psychiatric: She has a normal mood and affect. Her behavior is normal.          Assessment & Plan:  Marland KitchenMarland KitchenDiagnoses and all orders for this visit:  Non-restorative sleep -     Split night study  Reactive airway disease, mild intermittent, uncomplicated -     albuterol (PROVENTIL HFA;VENTOLIN HFA) 108 (90 Base) MCG/ACT inhaler; Inhale 2 puffs into the lungs every 4 (four) hours as needed for wheezing.  Class 1 obesity due to excess calories with serious comorbidity and body mass index (BMI) of 30.0 to 30.9 in adult -     Liraglutide -Weight Management (SAXENDA) 18 MG/3ML SOPN; Inject 0.6 mg into the skin daily. Please include ultra fine needles 15mm  Rectocele  Snoring -     Split night study  No energy -     Split night study  Essential hypertension, benign  Other orders -      valACYclovir (VALTREX) 1000 MG tablet; TAKE 2 TABLETS EVERY 12 HOURS FOR ONE DAY, TAKE AT THE ONSET OF COLD SORE   STOP BANG high risk with 4 yes.  Ordered sleep study.   She would like to restart saxenda.  Sent refill for Saxenda.  Advised patient to start back on the titration up to avoid nausea as a side effect.  Follow-up in 3 months.  Continue to work on 1500-calorie diet and 150 minutes of exercise a week.  Discussed with patient I did see a rectocele on examination today.  I gave her a handout of pelvic floor exercises she could start.  She does not have any significant problems with this.  She was not interested in a pessary today.  I gave her a handout on what a rectocele was.  Encouraged her to keep her bowels moving smoothly to avoid any extra pain or trapping.  Certainly if she has any issues we can get her to a surgeon to consider surgical fix.  If home exercises are not improving we could also consider formal physical therapy.  Marland Kitchen.Spent 30 minutes with patient and greater than 50 percent of visit spent counseling patient regarding treatment plan.

## 2017-10-21 ENCOUNTER — Encounter: Payer: Self-pay | Admitting: Physician Assistant

## 2017-10-21 DIAGNOSIS — J4520 Mild intermittent asthma, uncomplicated: Secondary | ICD-10-CM | POA: Insufficient documentation

## 2017-10-21 HISTORY — DX: Mild intermittent asthma, uncomplicated: J45.20

## 2017-10-23 ENCOUNTER — Other Ambulatory Visit: Payer: Self-pay | Admitting: Physician Assistant

## 2017-10-23 DIAGNOSIS — K219 Gastro-esophageal reflux disease without esophagitis: Secondary | ICD-10-CM

## 2017-10-27 ENCOUNTER — Other Ambulatory Visit: Payer: Self-pay

## 2017-10-27 DIAGNOSIS — Z6830 Body mass index (BMI) 30.0-30.9, adult: Principal | ICD-10-CM

## 2017-10-27 DIAGNOSIS — E6609 Other obesity due to excess calories: Secondary | ICD-10-CM

## 2017-10-27 MED ORDER — LIRAGLUTIDE -WEIGHT MANAGEMENT 18 MG/3ML ~~LOC~~ SOPN: 3.0000 mg | PEN_INJECTOR | Freq: Every day | SUBCUTANEOUS | 2 refills | Status: DC

## 2017-10-28 ENCOUNTER — Encounter: Payer: Self-pay | Admitting: Physician Assistant

## 2017-10-29 ENCOUNTER — Encounter: Payer: Self-pay | Admitting: Physician Assistant

## 2017-10-29 ENCOUNTER — Other Ambulatory Visit: Payer: Self-pay

## 2017-10-29 DIAGNOSIS — E6609 Other obesity due to excess calories: Secondary | ICD-10-CM

## 2017-10-29 DIAGNOSIS — Z6830 Body mass index (BMI) 30.0-30.9, adult: Principal | ICD-10-CM

## 2017-10-29 NOTE — Telephone Encounter (Signed)
Called [facility], and called Pt. Rx is ready. Pt has no further questions.

## 2017-11-09 ENCOUNTER — Other Ambulatory Visit: Payer: Self-pay | Admitting: Physician Assistant

## 2017-11-09 DIAGNOSIS — I10 Essential (primary) hypertension: Secondary | ICD-10-CM

## 2017-11-14 ENCOUNTER — Ambulatory Visit (HOSPITAL_BASED_OUTPATIENT_CLINIC_OR_DEPARTMENT_OTHER): Payer: BLUE CROSS/BLUE SHIELD | Attending: Physician Assistant | Admitting: Internal Medicine

## 2017-11-14 DIAGNOSIS — G4733 Obstructive sleep apnea (adult) (pediatric): Secondary | ICD-10-CM | POA: Insufficient documentation

## 2017-11-14 DIAGNOSIS — R5383 Other fatigue: Secondary | ICD-10-CM | POA: Insufficient documentation

## 2017-11-14 DIAGNOSIS — G4761 Periodic limb movement disorder: Secondary | ICD-10-CM | POA: Insufficient documentation

## 2017-11-14 DIAGNOSIS — G4736 Sleep related hypoventilation in conditions classified elsewhere: Secondary | ICD-10-CM | POA: Insufficient documentation

## 2017-11-14 DIAGNOSIS — G478 Other sleep disorders: Secondary | ICD-10-CM | POA: Diagnosis not present

## 2017-11-14 DIAGNOSIS — R0683 Snoring: Secondary | ICD-10-CM

## 2017-11-22 NOTE — Procedures (Signed)
Patient Name: Debbie [name], Debbie [name] Date: 11/14/2017 Gender: Female D.O.B: [birth year] Age (years): 56 Referring Provider: Iran Planas Height (inches): 66 Interpreting Physician: Baird Lyons MD, ABSM Weight (lbs): 181 RPSGT: Earney [place] BMI: 29 MRN: [id number] Neck Size: 15.00  CLINICAL INFORMATION Sleep Study Type: NPSG Indication for sleep study: Snoring  Epworth Sleepiness Score: 12  SLEEP STUDY TECHNIQUE As per the AASM Manual for the Scoring of Sleep and Associated Events v2.3 (April 2016) with a hypopnea requiring 4% desaturations.  The channels recorded and monitored were frontal, central and occipital EEG, electrooculogram (EOG), submentalis EMG (chin), nasal and oral airflow, thoracic and abdominal wall motion, anterior tibialis EMG, snore microphone, electrocardiogram, and pulse oximetry.  MEDICATIONS Medications self-administered by patient taken the night of the study : none reported  SLEEP ARCHITECTURE The study was initiated at 9:56:18 PM and ended at 4:30:16 AM.  Sleep onset time was 105.3 minutes and the sleep efficiency was 32.9%%. The total sleep time was 129.5 minutes.  Stage REM latency was N/A minutes.  The patient spent 8.1%% of the night in stage N1 sleep, 91.9%% in stage N2 sleep, 0.0%% in stage N3 and 0.00% in REM.  Alpha intrusion was absent.  Supine sleep was 0.00%.  RESPIRATORY PARAMETERS The overall apnea/hypopnea index (AHI) was 12.0 per hour. There were 5 total apneas, including 0 obstructive, 5 central and 0 mixed apneas. There were 21 hypopneas and 6 RERAs.  The AHI during Stage REM sleep was N/A per hour.  AHI while supine was N/A per hour.  The mean oxygen saturation was 88.3%. The minimum SpO2 during sleep was 81.0%.  soft snoring was noted during this study.  CARDIAC DATA The 2 lead EKG demonstrated sinus rhythm. The mean heart rate was 69.1 beats per minute. Other EKG findings include: None.  LEG MOVEMENT  DATA The total PLMS were 276 with a resulting PLMS index of 127.9.0. Associated arousal with leg movement index was 21.3 .  IMPRESSIONS - Mild obstructive sleep apnea occurred during this study (AHI = 12.0/h). - Patient had difficulty initiating and maintaining sleep. There were insufficient early sleep and events to meet protocol requirements for split CPAP titration. - No significant central sleep apnea occurred during this study (CAI = 2.3/h). - Moderate oxygen desaturation was noted during this study (Min O2 = 81.0%). Mean 88.3%. Much coughing noted. - The patient snored with soft snoring volume. - No cardiac abnormalities were noted during this study. - Clinically significant periodic limb movements did occur during sleep. Associated arousals were significant.  DIAGNOSIS - Obstructive Sleep Apnea (327.23 [G47.33 ICD-10]) - Periodic Limb Movement During Sleep (327.51 [G47.61 ICD-10]) - Nocturnal Hypoxemia (327.26 [G47.36 ICD-10])  RECOMMENDATIONS - Recommend CPAP titration study, which will allow assessment of ability of CPAP to correct hypoxemia, or document qualification for addition of O2 through CPAP. - Mirapex, Requip, or Sinemet for treatment of Periodic Leg Movements of Sleep. - Be careful with alcohol, sedatives and other CNS depressants that may worsen sleep apnea and disrupt normal sleep architecture. - Sleep hygiene should be reviewed to assess factors that may improve sleep quality. - Weight management and regular exercise should be initiated or continued if appropriate.  [Electronically signed] 11/22/2017 03:48 PM  Baird Lyons MD, [place], American Board of Sleep Medicine   NPI: [id number]                          [place], [place] [facility]  ELECTRONICALLY SIGNED ON:  11/22/2017, 3:35 PM [place] PH: [phone]   FX: [phone] [place]

## 2017-11-23 ENCOUNTER — Encounter: Payer: Self-pay | Admitting: Physician Assistant

## 2017-11-23 DIAGNOSIS — G4734 Idiopathic sleep related nonobstructive alveolar hypoventilation: Secondary | ICD-10-CM

## 2017-11-23 DIAGNOSIS — G4733 Obstructive sleep apnea (adult) (pediatric): Secondary | ICD-10-CM

## 2017-11-23 DIAGNOSIS — G4761 Periodic limb movement disorder: Secondary | ICD-10-CM

## 2017-11-23 HISTORY — DX: Idiopathic sleep related nonobstructive alveolar hypoventilation: G47.34

## 2017-11-23 HISTORY — DX: Periodic limb movement disorder: G47.61

## 2017-11-23 HISTORY — DX: Obstructive sleep apnea (adult) (pediatric): G47.33

## 2017-11-23 NOTE — Progress Notes (Signed)
Call pt: sleep test positive for sleep apnea, periodic limb movement, and decreased O2 at night. Need to schedule appt in office to go over study and how we can get you to sleep enough to have CPAP titration to determine the best pressure to be at.

## 2017-11-28 ENCOUNTER — Ambulatory Visit: Payer: BLUE CROSS/BLUE SHIELD | Admitting: Physician Assistant

## 2017-11-28 ENCOUNTER — Encounter: Payer: Self-pay | Admitting: Physician Assistant

## 2017-11-28 VITALS — BP 105/68 | HR 69 | Wt 184.0 lb

## 2017-11-28 DIAGNOSIS — G4734 Idiopathic sleep related nonobstructive alveolar hypoventilation: Secondary | ICD-10-CM

## 2017-11-28 DIAGNOSIS — G4733 Obstructive sleep apnea (adult) (pediatric): Secondary | ICD-10-CM | POA: Diagnosis not present

## 2017-11-28 DIAGNOSIS — G4761 Periodic limb movement disorder: Secondary | ICD-10-CM | POA: Diagnosis not present

## 2017-11-28 MED ORDER — ROPINIROLE HCL 0.25 MG PO TABS: ORAL_TABLET | ORAL | 1 refills | Status: DC

## 2017-11-28 NOTE — Progress Notes (Signed)
Subjective:    Patient ID: Debbie [name], female    DOB: 1961/04/04, 57 y.o.   MRN: [id number]  HPI Debbie [name] presents today to discuss her sleep apnea study results. She had the sleep study completed on 11/14/17 for over weight and fatigue. Patient was unaware of her results. The studied showed mild OSA during the study where the patient had difficulty initiating and maintaining sleep. There were sufficient early sleep and events to meet protocol requirements for split CPAP titration. No significant central sleep apnea. Moderate oxygen desaturation was noted. There were clinically significant periodic limb movement that occurred during sleep and associated arousals were significant.   Recommendations were to help control the periodic leg movements of sleep followed by a CPAP titration study.   Patient states that she was unaware of the periodic limb movements with significant arousals during the study and at home. She states that she is normally able to fall asleep fairly easy at night and wakes up a couple times to use the bathroom (she takes Hyzaar at night time so she "accepts" that she gets up to pee). She states that after she wakes up to use the bathroom she has no issues falling back asleep.   Debbie [name] is still taking Saxenda for weight loss at this time and next follow up is in several weeks. She is tolerating this well at this time and has no questions or concerns regarding that. She has lost 4lbs.  .. Active Ambulatory Problems    Diagnosis Date Noted  . Essential hypertension, benign 12/01/2012  . Generalized anxiety disorder 12/01/2012  . Family history of colon cancer 12/01/2012  . History of hysterectomy 01/04/2013  . History of shingles 01/04/2013  . Gastritis 02/17/2013  . Class 1 obesity due to excess calories with serious comorbidity and body mass index (BMI) of 30.0 to 30.9 in adult 03/05/2013  . Elevated LFTs 05/11/2013  . Labral tear of shoulder 08/29/2014  . Large  breasts 06/26/2016  . Primary insomnia 06/26/2016  . Mid back pain 06/26/2016  . Chronic shoulder pain 06/26/2016  . Fungal infection of toenail 01/17/2017  . Chest tightness 07/09/2017  . SOB (shortness of breath) 07/09/2017  . Cough 07/09/2017  . Snoring 10/20/2017  . Rectocele 10/20/2017  . Non-restorative sleep 10/20/2017  . No energy 10/20/2017  . Reactive airway disease, mild intermittent, uncomplicated 56/43/3295  . Periodic limb movement 11/23/2017  . OSA (obstructive sleep apnea) 11/23/2017  . Nocturnal hypoxemia 11/23/2017   Resolved Ambulatory Problems    Diagnosis Date Noted  . No Resolved Ambulatory Problems   Past Medical History:  Diagnosis Date  . Anxiety   . Asthma   . Bronchitis   . Essential hypertension, benign 12/01/2012  . Generalized anxiety disorder 12/01/2012  . Hypertension   . Seasonal allergies       Review of Systems  Constitutional: Positive for fatigue. Negative for chills and fever.  Eyes: Negative for visual disturbance.  Respiratory: Negative for cough and shortness of breath.   Cardiovascular: Negative for chest pain and palpitations.  Gastrointestinal: Negative for diarrhea, nausea and vomiting.  Psychiatric/Behavioral: Positive for sleep disturbance.       Objective:   Physical Exam  Constitutional: She is oriented to person, place, and time. She appears well-developed and well-nourished.  HENT:  Head: Normocephalic and atraumatic.  Eyes: Conjunctivae are normal.  Cardiovascular: Normal rate, regular rhythm, normal heart sounds and intact distal pulses.  Pulmonary/Chest: Effort normal and breath sounds normal. No respiratory distress.  She has no wheezes.  Musculoskeletal: Normal range of motion.  Neurological: She is alert and oriented to person, place, and time.  Skin: Skin is warm and dry.  Psychiatric: She has a normal mood and affect. Her behavior is normal.          Assessment & Plan:  Marland KitchenMarland KitchenPeggie was seen today for  follow-up.  Diagnoses and all orders for this visit:  OSA (obstructive sleep apnea) -     AMBULATORY NON FORMULARY MEDICATION; Continuous positive airway pressure (CPAP) machine set at autopap, with all supplemental supplies as needed.  Nocturnal hypoxemia  Periodic limb movement -     rOPINIRole (REQUIP) 0.25 MG tablet; 1-3 tablets at bedtime for restless leg syndrome.   Decided we would set up with autopap to get her started on CPAP. Start requip for limb movement at night and since test showed multiple arousal due to limb movement this could help sleep and overall fatigue.   Continue working on weight loss with saxenda. Follow up in 2 months.

## 2017-11-28 NOTE — Patient Instructions (Signed)
Placed order for CPAP for [facility].  Start requip at bedtime.

## 2017-11-28 NOTE — Progress Notes (Deleted)
Lost 4 lbs 

## 2017-11-30 MED ORDER — AMBULATORY NON FORMULARY MEDICATION: 0 refills | Status: AC

## 2017-12-02 ENCOUNTER — Other Ambulatory Visit: Payer: Self-pay | Admitting: *Deleted

## 2017-12-02 DIAGNOSIS — R059 Cough, unspecified: Secondary | ICD-10-CM

## 2017-12-02 DIAGNOSIS — R05 Cough: Secondary | ICD-10-CM

## 2017-12-02 DIAGNOSIS — R0602 Shortness of breath: Secondary | ICD-10-CM

## 2017-12-02 DIAGNOSIS — R0789 Other chest pain: Secondary | ICD-10-CM

## 2017-12-02 MED ORDER — MONTELUKAST SODIUM 10 MG PO TABS: 10.0000 mg | ORAL_TABLET | Freq: Every day | ORAL | 3 refills | Status: DC

## 2017-12-08 ENCOUNTER — Encounter: Payer: Self-pay | Admitting: Physician Assistant

## 2017-12-17 DIAGNOSIS — G4733 Obstructive sleep apnea (adult) (pediatric): Secondary | ICD-10-CM | POA: Diagnosis not present

## 2018-01-17 DIAGNOSIS — G4733 Obstructive sleep apnea (adult) (pediatric): Secondary | ICD-10-CM | POA: Diagnosis not present

## 2018-01-20 ENCOUNTER — Encounter: Payer: Self-pay | Admitting: Physician Assistant

## 2018-01-20 ENCOUNTER — Ambulatory Visit: Payer: BLUE CROSS/BLUE SHIELD | Admitting: Physician Assistant

## 2018-01-20 ENCOUNTER — Ambulatory Visit (INDEPENDENT_AMBULATORY_CARE_PROVIDER_SITE_OTHER): Payer: BLUE CROSS/BLUE SHIELD

## 2018-01-20 VITALS — BP 119/67 | HR 80 | Ht 66.0 in | Wt 181.0 lb

## 2018-01-20 DIAGNOSIS — M79642 Pain in left hand: Secondary | ICD-10-CM

## 2018-01-20 DIAGNOSIS — G4734 Idiopathic sleep related nonobstructive alveolar hypoventilation: Secondary | ICD-10-CM

## 2018-01-20 DIAGNOSIS — M79641 Pain in right hand: Secondary | ICD-10-CM

## 2018-01-20 DIAGNOSIS — Z6830 Body mass index (BMI) 30.0-30.9, adult: Secondary | ICD-10-CM

## 2018-01-20 DIAGNOSIS — R059 Cough, unspecified: Secondary | ICD-10-CM

## 2018-01-20 DIAGNOSIS — E6609 Other obesity due to excess calories: Secondary | ICD-10-CM

## 2018-01-20 DIAGNOSIS — M7989 Other specified soft tissue disorders: Secondary | ICD-10-CM | POA: Diagnosis not present

## 2018-01-20 DIAGNOSIS — R05 Cough: Secondary | ICD-10-CM | POA: Diagnosis not present

## 2018-01-20 DIAGNOSIS — F411 Generalized anxiety disorder: Secondary | ICD-10-CM | POA: Diagnosis not present

## 2018-01-20 DIAGNOSIS — M25649 Stiffness of unspecified hand, not elsewhere classified: Secondary | ICD-10-CM | POA: Diagnosis not present

## 2018-01-20 DIAGNOSIS — E66811 Obesity, class 1: Secondary | ICD-10-CM

## 2018-01-20 DIAGNOSIS — M1812 Unilateral primary osteoarthritis of first carpometacarpal joint, left hand: Secondary | ICD-10-CM | POA: Diagnosis not present

## 2018-01-20 DIAGNOSIS — M1811 Unilateral primary osteoarthritis of first carpometacarpal joint, right hand: Secondary | ICD-10-CM | POA: Diagnosis not present

## 2018-01-20 DIAGNOSIS — G4733 Obstructive sleep apnea (adult) (pediatric): Secondary | ICD-10-CM

## 2018-01-20 MED ORDER — LIRAGLUTIDE -WEIGHT MANAGEMENT 18 MG/3ML ~~LOC~~ SOPN: 3.0000 mg | PEN_INJECTOR | Freq: Every day | SUBCUTANEOUS | 5 refills | Status: DC

## 2018-01-20 MED ORDER — BUPROPION HCL ER (XL) 150 MG PO TB24: 150.0000 mg | ORAL_TABLET | ORAL | 1 refills | Status: DC

## 2018-01-20 NOTE — Progress Notes (Addendum)
Subjective:    Patient ID: Debbie [name], female    DOB: [birth year], 57 y.o.   MRN: [id number]  HPI  Pt is a 57 yo female who presents to the clinic for follow of CPAP for OsA. She was set up on 12/17/17. She uses it greater than 4 hours most nights. She only remember 3 nights she pulled it off before 4 hrs. She does feel like it is helping with energy.she has also had a cough since starting with CPAP. No fever, chills, SOB, or colored sputum. She has noticed requip helping with sleep as well. She takes 2 pills every night. She lost 19lbs since february. saxenda is doing great. No side effects. She is exercising and watching diet.   She does mention some bilateral hand stiffness and pain. She has noticed over the last few months building. Not taking anything for it. She believes a grandmother had RA.   .. Active Ambulatory Problems    Diagnosis Date Noted  . Essential hypertension, benign 12/01/2012  . Generalized anxiety disorder 12/01/2012  . Family history of colon cancer 12/01/2012  . History of hysterectomy 01/04/2013  . History of shingles 01/04/2013  . Gastritis 02/17/2013  . Class 1 obesity due to excess calories with serious comorbidity and body mass index (BMI) of 30.0 to 30.9 in adult 03/05/2013  . Elevated LFTs 05/11/2013  . Labral tear of shoulder 08/29/2014  . Large breasts 06/26/2016  . Primary insomnia 06/26/2016  . Mid back pain 06/26/2016  . Chronic shoulder pain 06/26/2016  . Fungal infection of toenail 01/17/2017  . Chest tightness 07/09/2017  . SOB (shortness of breath) 07/09/2017  . Cough 07/09/2017  . Snoring 10/20/2017  . Rectocele 10/20/2017  . Non-restorative sleep 10/20/2017  . No energy 10/20/2017  . Reactive airway disease, mild intermittent, uncomplicated 63/87/5643  . Periodic limb movement 11/23/2017  . OSA (obstructive sleep apnea) 11/23/2017  . Nocturnal hypoxemia 11/23/2017  . Bilateral hand pain 01/23/2018  . Hand joint stiff, unspecified  laterality 01/23/2018   Resolved Ambulatory Problems    Diagnosis Date Noted  . No Resolved Ambulatory Problems   Past Medical History:  Diagnosis Date  . Anxiety   . Asthma   . Bronchitis   . Essential hypertension, benign 12/01/2012  . Generalized anxiety disorder 12/01/2012  . Hypertension   . Seasonal allergies      Review of Systems See HPI>     Objective:   Physical Exam  Constitutional: She is oriented to person, place, and time. She appears well-developed and well-nourished.  HENT:  Head: Normocephalic and atraumatic.  Cardiovascular: Normal rate and regular rhythm.  Pulmonary/Chest: Effort normal and breath sounds normal. She has no wheezes.  Musculoskeletal:  Normal hand grip.  No joint swelling.  No joint nodules.   Neurological: She is alert and oriented to person, place, and time.  Psychiatric: She has a normal mood and affect. Her behavior is normal.          Assessment & Plan:  Marland KitchenMarland KitchenPeggie was seen today for obesity and sleep apnea.  Diagnoses and all orders for this visit:  OSA (obstructive sleep apnea)  Nocturnal hypoxemia  Class 1 obesity due to excess calories with serious comorbidity and body mass index (BMI) of 30.0 to 30.9 in adult -     Liraglutide -Weight Management (SAXENDA) 18 MG/3ML SOPN; Inject 3 mg into the skin daily. Please include ultra fine needles 63mm -     buPROPion (WELLBUTRIN XL) 150 MG 24  hr tablet; Take 1 tablet (150 mg total) by mouth every morning.  Generalized anxiety disorder -     buPROPion (WELLBUTRIN XL) 150 MG 24 hr tablet; Take 1 tablet (150 mg total) by mouth every morning.  Cough -     DG Chest 2 View  Bilateral hand pain -     DG Hand Complete Right -     DG Hand Complete Left -     Rheumatoid Factor -     Cyclic citrul peptide antibody, IgG -     ANA  Hand joint stiff, unspecified laterality -     DG Hand Complete Right -     DG Hand Complete Left -     Rheumatoid Factor -     Cyclic citrul peptide  antibody, IgG -     ANA   Medications refilled. CPAP doing well. Will send note to [facility]. CXR to evaluate cough.   Viewed compliance report with usage 30/30 days and greater than 4 hours 93 percent of time. Only 2 days less than 4hrs.   Continue saxenda. Continue with diet and weight loss.   Work up started for RA.

## 2018-01-21 NOTE — Progress Notes (Signed)
Normal CXR

## 2018-01-21 NOTE — Progress Notes (Signed)
Xray shows osteoarthritis changes.

## 2018-01-23 ENCOUNTER — Encounter: Payer: Self-pay | Admitting: Physician Assistant

## 2018-01-23 DIAGNOSIS — M79641 Pain in right hand: Secondary | ICD-10-CM | POA: Insufficient documentation

## 2018-01-23 DIAGNOSIS — M25649 Stiffness of unspecified hand, not elsewhere classified: Secondary | ICD-10-CM | POA: Insufficient documentation

## 2018-01-23 DIAGNOSIS — M79642 Pain in left hand: Secondary | ICD-10-CM

## 2018-01-23 HISTORY — DX: Pain in left hand: M79.642

## 2018-01-23 HISTORY — DX: Stiffness of unspecified hand, not elsewhere classified: M25.649

## 2018-01-25 ENCOUNTER — Telehealth: Payer: Self-pay | Admitting: Physician Assistant

## 2018-01-25 NOTE — Telephone Encounter (Signed)
Please fax to [facility] last note for compliance.

## 2018-01-27 NOTE — Telephone Encounter (Signed)
Received compliance report this morning.

## 2018-02-02 ENCOUNTER — Encounter: Payer: Self-pay | Admitting: Physician Assistant

## 2018-02-02 MED ORDER — PREDNISONE 50 MG PO TABS: ORAL_TABLET | ORAL | 0 refills | Status: DC

## 2018-02-03 DIAGNOSIS — M79641 Pain in right hand: Secondary | ICD-10-CM | POA: Diagnosis not present

## 2018-02-03 DIAGNOSIS — M79642 Pain in left hand: Secondary | ICD-10-CM | POA: Diagnosis not present

## 2018-02-03 DIAGNOSIS — M25649 Stiffness of unspecified hand, not elsewhere classified: Secondary | ICD-10-CM | POA: Diagnosis not present

## 2018-02-04 NOTE — Progress Notes (Signed)
Call pt: negative rheumatoid arthritis in blood work.

## 2018-02-05 LAB — ANTI-NUCLEAR AB-TITER (ANA TITER): ANA Titer 1: 1:160 {titer} — ABNORMAL HIGH

## 2018-02-05 LAB — ANA: Anti Nuclear Antibody(ANA): POSITIVE — AB

## 2018-02-05 LAB — CYCLIC CITRUL PEPTIDE ANTIBODY, IGG: Cyclic Citrullin Peptide Ab: <16 UNITS

## 2018-02-05 LAB — RHEUMATOID FACTOR: Rhuematoid fact SerPl-aCnc: <14 IU/mL (ref <14)

## 2018-02-06 ENCOUNTER — Encounter: Payer: Self-pay | Admitting: Physician Assistant

## 2018-02-06 DIAGNOSIS — R768 Other specified abnormal immunological findings in serum: Secondary | ICD-10-CM

## 2018-02-06 HISTORY — DX: Other specified abnormal immunological findings in serum: R76.8

## 2018-02-06 NOTE — Progress Notes (Signed)
Call pt: ANA is positive. This is an autoimmune marker that is non-specific. I think you need to see rheumatology to be evaluated for lupus. Are you ok with referral?

## 2018-02-09 ENCOUNTER — Other Ambulatory Visit: Payer: Self-pay | Admitting: Physician Assistant

## 2018-02-09 DIAGNOSIS — R768 Other specified abnormal immunological findings in serum: Secondary | ICD-10-CM

## 2018-02-09 DIAGNOSIS — M25649 Stiffness of unspecified hand, not elsewhere classified: Secondary | ICD-10-CM

## 2018-02-09 DIAGNOSIS — M79642 Pain in left hand: Principal | ICD-10-CM

## 2018-02-09 DIAGNOSIS — M79641 Pain in right hand: Secondary | ICD-10-CM

## 2018-02-09 NOTE — Progress Notes (Signed)
amb  

## 2018-02-10 NOTE — Progress Notes (Signed)
Pt has seen results on MyChart and message also sent for patient to call back if any questions.

## 2018-02-16 DIAGNOSIS — G4733 Obstructive sleep apnea (adult) (pediatric): Secondary | ICD-10-CM | POA: Diagnosis not present

## 2018-02-27 NOTE — Progress Notes (Signed)
Office Visit Note  Patient: Debbie [name]             Date of Birth: 04/20/1961           MRN: [id number]             PCP: Lavada Mesi Referring: Lavada Mesi Visit Date: 03/13/2018 Occupation: Scientist, forensic at [employer]  Subjective:  No chief complaint on file.   History of Present Illness: Debbie [name] is a 57 y.o. female in consultation per request of her PCP for positive ANA.  According to patient in October 2018 she has developed some shortness of breath for which she was given some inhalers.  The symptoms lingered on until March 2019 at the time she was given some prednisone and her symptoms got better.  She was also diagnosed with sleep apnea and was given CPAP.  The same time she was diagnosed with restless leg syndrome and was given roperinol.  After starting the medication she started experiencing joint stiffness and tingling in her upper and lower extremities.  She was also experiencing burning sensation over trochanteric area.  She decided to come off the allopurinol and her symptoms improved.  She continues to have discomfort in her bilateral hands, bilateral knee joints and her ankles.  She denies any joint swelling.  Activities of Daily Living:  Patient reports morning stiffness for 20 minutes.   Patient Reports nocturnal pain.  Difficulty dressing/grooming: Denies Difficulty climbing stairs: Denies Difficulty getting out of chair: Denies Difficulty using hands for taps, buttons, cutlery, and/or writing: Reports  Review of Systems  Constitutional: Positive for fatigue. Negative for night sweats, weight gain and weight loss.  HENT: Negative for mouth sores, trouble swallowing, trouble swallowing, mouth dryness and nose dryness.   Eyes: Negative for pain, redness, visual disturbance and dryness.  Respiratory: Positive for difficulty breathing. Negative for cough and shortness of breath.   Cardiovascular: Negative for chest pain,  palpitations, hypertension, irregular heartbeat and swelling in legs/feet.  Gastrointestinal: Positive for diarrhea. Negative for abdominal pain, blood in stool, constipation, nausea and vomiting.       H/o IBS  Endocrine: Negative for increased urination.  Genitourinary: Negative for pelvic pain and vaginal dryness.  Musculoskeletal: Positive for arthralgias, joint pain and morning stiffness. Negative for gait problem, joint swelling, myalgias, muscle weakness, muscle tenderness and myalgias.  Skin: Negative for color change, rash, hair loss, skin tightness, ulcers and sensitivity to sunlight.  Allergic/Immunologic: Negative for susceptible to infections.  Neurological: Negative for dizziness, light-headedness, headaches, memory loss, night sweats and weakness.  Hematological: Negative for bruising/bleeding tendency and swollen glands.  Psychiatric/Behavioral: Negative for depressed mood, confusion and sleep disturbance. The patient is not nervous/anxious.     PMFS History:  Patient Active Problem List   Diagnosis Date Noted  . Positive ANA (antinuclear antibody) 02/06/2018  . Bilateral hand pain 01/23/2018  . Hand joint stiff, unspecified laterality 01/23/2018  . Periodic limb movement 11/23/2017  . OSA (obstructive sleep apnea) 11/23/2017  . Nocturnal hypoxemia 11/23/2017  . Reactive airway disease, mild intermittent, uncomplicated 17/61/6073  . Snoring 10/20/2017  . Rectocele 10/20/2017  . Non-restorative sleep 10/20/2017  . No energy 10/20/2017  . Chest tightness 07/09/2017  . SOB (shortness of breath) 07/09/2017  . Cough 07/09/2017  . Fungal infection of toenail 01/17/2017  . Large breasts 06/26/2016  . Primary insomnia 06/26/2016  . Mid back pain 06/26/2016  . Chronic shoulder pain 06/26/2016  . Labral tear  of shoulder 08/29/2014  . Elevated LFTs 05/11/2013  . Class 1 obesity due to excess calories with serious comorbidity and body mass index (BMI) of 30.0 to 30.9 in adult  03/05/2013  . Gastritis 02/17/2013  . History of hysterectomy 01/04/2013  . History of shingles 01/04/2013  . Essential hypertension, benign 12/01/2012  . Generalized anxiety disorder 12/01/2012  . Family history of colon cancer 12/01/2012    Past Medical History:  Diagnosis Date  . Anxiety   . Asthma    with severe URIs  . Bronchitis   . Essential hypertension, benign 12/01/2012  . Generalized anxiety disorder 12/01/2012  . Hypertension   . Seasonal allergies     Family History  Problem Relation Age of Onset  . Hypertension Mother   . Rheum arthritis Mother   . Hypertension Father   . Alcoholism Father   . Colon cancer Father   . Lung cancer Father   . Depression Father   . Diabetes Father   . Skin cancer Father   . Hypertension Brother   . Hyperlipidemia Maternal Grandfather   . Stroke Paternal Grandmother   . Healthy Daughter   . Healthy Daughter    Past Surgical History:  Procedure Laterality Date  . ABDOMINAL HYSTERECTOMY  2011  . ANKLE SURGERY    . BREAST CYST EXCISION Bilateral   . BREAST EXCISIONAL BIOPSY Bilateral   . CHOLECYSTECTOMY  2012  . KNEE ARTHROSCOPY W/ MEDIAL COLLATERAL LIGAMENT (MCL) REPAIR Left 10-07-2012  . TONSILLECTOMY  1969   Social History   Social History Narrative  . Not on file    Objective: Vital Signs: BP 109/73 (BP Location: Right Arm, Patient Position: Sitting, Cuff Size: Normal)   Pulse 71   Resp 15   Ht 5\' 6"  (1.676 m)   Wt 183 lb (83 kg)   BMI 29.54 kg/m    Physical Exam  Constitutional: She is oriented to person, place, and time. She appears well-developed and well-nourished.  HENT:  Head: Normocephalic and atraumatic.  Eyes: Conjunctivae and EOM are normal.  Neck: Normal range of motion.  Cardiovascular: Normal rate, regular rhythm, normal heart sounds and intact distal pulses.  Pulmonary/Chest: Effort normal and breath sounds normal.  Abdominal: Soft. Bowel sounds are normal.  Lymphadenopathy:    She has no  cervical adenopathy.  Neurological: She is alert and oriented to person, place, and time.  Skin: Skin is warm and dry. Capillary refill takes less than 2 seconds.  Psychiatric: She has a normal mood and affect. Her behavior is normal.  Nursing note and vitals reviewed.    Musculoskeletal Exam: C-spine is thoracic and lumbar spine good range of motion.  She had no SI joint tenderness.  Shoulder joints elbow joints wrist joints MCPs were in good range of motion.  She has bilateral CMC PIP and DIP thickening.  No synovitis was noted.  Hip joints knee joints ankles were in good range of motion.  She has bilateral pes planus and hammertoes.  CDAI Exam: No CDAI exam completed.   Investigation: Findings:  02/03/2018 ANA 1: 160 homogeneous,  RF<14, CCP <16 07/08/17:TSH 0.94, vitamin B12 472, vitamin D 43   Imaging: Xr Foot 2 Views Left  Result Date: 03/13/2018 First MTP, PIP and DIP narrowing was noted.  No erosive changes were noted.  No intertarsal joint space narrowing was noted.  Dorsal spurring and calcaneal spur was noted. Impression: These findings are consistent with osteoarthritis of the foot.  Xr Foot 2 Views Right  Result Date: 03/13/2018 First MTP, all PIP and DIP narrowing was noted.  No erosive changes were noted.  Dorsal spur was noted.  Calcaneal spur was noted.  Hardware noted in the right fibula. Impression: These findings are consistent with osteoarthritis of the foot.  Xr Hand 2 View Left  Result Date: 03/13/2018 PIP and DIP narrowing was noted.  Severe CMC narrowing was noted.  No MCP intercarpal radiocarpal joint space narrowing was noted.  No erosive changes were noted. Impression: These findings are consistent with osteoarthritis of the hand.  Xr Hand 2 View Right  Result Date: 03/13/2018 PIP and DIP narrowing was noted.  Severe CMC narrowing was noted.  No MCP intercarpal radiocarpal joint space narrowing was noted.  No erosive changes were noted. Impression: These  findings are consistent with osteoarthritis of the hand.   Recent Labs: Component     Latest Ref Rng & Units 07/08/2017 02/03/2018  Vitamin D 1, 25 (OH) Total     18 - 72 pg/mL 43   Vitamin D3 1, 25 (OH)     pg/mL 43   Vitamin D2 1, 25 (OH)     pg/mL <8   ANA Pattern 1       HOMOGENEOUS (A)  ANA Titer 1     titer  1:160 (H)  TSH     0.40 - 4.50 mIU/L 0.94   Vitamin B12     200 - 1,100 pg/mL 472   RA Latex Turbid.     <69 IU/mL  <62  Cyclic Citrullin Peptide Ab     UNITS  <16  Anti Nuclear Antibody(ANA)     NEGATIVE  POSITIVE (A)   Lab Results  Component Value Date   NA 143 07/08/2017   K 3.7 07/08/2017   CL 104 07/08/2017   CO2 33 (H) 07/08/2017   GLUCOSE 91 07/08/2017   BUN 16 07/08/2017   CREATININE 0.66 07/08/2017   BILITOT 0.7 07/08/2017   AST 23 07/08/2017   ALT 21 07/08/2017   PROT 6.7 07/08/2017   CALCIUM 9.9 07/08/2017   GFRAA 114 07/08/2017    Speciality Comments: No specialty comments available.  Procedures:  No procedures performed Allergies: Azithromycin; Erythromycin; Levofloxacin; and Penicillins   Assessment / Plan:     Visit Diagnoses: Bilateral hand pain -the clinical findings are consistent with the osteoarthritis.  She gives history of intermittent swelling in her hands.  Plan: Uric acid  Pain in both hands - Plan: XR Hand 2 View Right, XR Hand 2 View Left.  The x-ray of bilateral hands were consistent with osteoarthritis.  Joint protection muscle strengthening was discussed.  List of natural anti-inflammatories was given.  Pain in both feet -she has pes planus and bilateral hammertoes.  Plan: XR Foot 2 Views Right, XR Foot 2 Views Left..  The x-rays of bilateral feet were consistent with osteoarthritis.  Bilateral calcaneal spurs and dorsal spurring was noted.  Feet exercises were discussed at length.  Positive ANA (antinuclear antibody) -she has low titer positive ANA.  I do not see any clinical features of autoimmune disease except for  history of intermittent swelling in her hands.  I will obtain following labs today.  Plan: Anti-scleroderma antibody, RNP Antibody, Anti-Smith antibody, Sjogrens syndrome-A extractable nuclear antibody, Sjogrens syndrome-B extractable nuclear antibody, Anti-DNA antibody, double-stranded, C3 and C4, Sedimentation rate  Essential hypertension, benign  OSA (obstructive sleep apnea)  Primary insomnia  History of shingles  Generalized anxiety disorder  Family history of colon cancer  Orders: Orders Placed This Encounter  Procedures  . XR Hand 2 View Right  . XR Hand 2 View Left  . XR Foot 2 Views Right  . XR Foot 2 Views Left  . Anti-scleroderma antibody  . RNP Antibody  . Anti-Smith antibody  . Sjogrens syndrome-A extractable nuclear antibody  . Sjogrens syndrome-B extractable nuclear antibody  . Anti-DNA antibody, double-stranded  . C3 and C4  . Sedimentation rate  . Uric acid   No orders of the defined types were placed in this encounter.   Face-to-face time spent with patient was 50 minutes. Greater than 50% of time was spent in counseling and coordination of care.  Follow-Up Instructions: Return for Osteoarthritis, positive ANA.   Bo Merino, MD  Note - This record has been created using Editor, commissioning.  Chart creation errors have been sought, but may not always  have been located. Such creation errors do not reflect on  the standard of medical care.

## 2018-03-02 ENCOUNTER — Encounter: Payer: Self-pay | Admitting: Physician Assistant

## 2018-03-02 DIAGNOSIS — G4761 Periodic limb movement disorder: Secondary | ICD-10-CM

## 2018-03-02 MED ORDER — ROPINIROLE HCL 0.25 MG PO TABS: ORAL_TABLET | ORAL | 5 refills | Status: DC

## 2018-03-02 MED ORDER — ROPINIROLE HCL 0.25 MG PO TABS
ORAL_TABLET | ORAL | 1 refills | Status: DC
Start: 2018-03-02 — End: 2018-04-10

## 2018-03-02 NOTE — Addendum Note (Signed)
Addended by: Donella Stade on: 03/02/2018 01:53 PM   Modules accepted: Orders

## 2018-03-13 ENCOUNTER — Ambulatory Visit: Payer: BLUE CROSS/BLUE SHIELD | Admitting: Rheumatology

## 2018-03-13 ENCOUNTER — Ambulatory Visit (INDEPENDENT_AMBULATORY_CARE_PROVIDER_SITE_OTHER): Payer: Self-pay

## 2018-03-13 ENCOUNTER — Telehealth: Payer: Self-pay | Admitting: Rheumatology

## 2018-03-13 ENCOUNTER — Encounter: Payer: Self-pay | Admitting: Rheumatology

## 2018-03-13 VITALS — BP 109/73 | HR 71 | Resp 15 | Ht 66.0 in | Wt 183.0 lb

## 2018-03-13 DIAGNOSIS — M79641 Pain in right hand: Secondary | ICD-10-CM | POA: Diagnosis not present

## 2018-03-13 DIAGNOSIS — M79642 Pain in left hand: Secondary | ICD-10-CM | POA: Diagnosis not present

## 2018-03-13 DIAGNOSIS — M79671 Pain in right foot: Secondary | ICD-10-CM

## 2018-03-13 DIAGNOSIS — M79672 Pain in left foot: Secondary | ICD-10-CM

## 2018-03-13 DIAGNOSIS — I10 Essential (primary) hypertension: Secondary | ICD-10-CM

## 2018-03-13 DIAGNOSIS — F411 Generalized anxiety disorder: Secondary | ICD-10-CM

## 2018-03-13 DIAGNOSIS — R768 Other specified abnormal immunological findings in serum: Secondary | ICD-10-CM

## 2018-03-13 DIAGNOSIS — F5101 Primary insomnia: Secondary | ICD-10-CM

## 2018-03-13 DIAGNOSIS — G4733 Obstructive sleep apnea (adult) (pediatric): Secondary | ICD-10-CM

## 2018-03-13 DIAGNOSIS — Z800 Family history of malignant neoplasm of digestive organs: Secondary | ICD-10-CM

## 2018-03-13 DIAGNOSIS — Z8619 Personal history of other infectious and parasitic diseases: Secondary | ICD-10-CM

## 2018-03-13 NOTE — Telephone Encounter (Signed)
Patient left a voicemail stating she was returning a call to the office regarding the results of her x-rays.

## 2018-03-13 NOTE — Patient Instructions (Signed)
 Natural anti-inflammatories  You can purchase these at [employer], [employer] or online.  . Turmeric (capsules)  . Ginger (ginger root or capsules)  . Omega 3 (Fish, flax seeds, chia seeds, walnuts, almonds)  . Tart cherry (dried or extract)   Patient should be under the care of a physician while taking these supplements. This may not be reproduced without the permission of Dr. Shaili Deveshwar.   Hand Exercises Hand exercises can be helpful to almost anyone. These exercises can strengthen the hands, improve flexibility and movement, and increase blood flow to the hands. These results can make work and daily tasks easier. Hand exercises can be especially helpful for people who have joint pain from arthritis or have nerve damage from overuse (carpal tunnel syndrome). These exercises can also help people who have injured a hand. Most of these hand exercises are fairly gentle stretching routines. You can do them often throughout the day. Still, it is a good idea to ask your health care provider which exercises would be best for you. Warming your hands before exercise may help to reduce stiffness. You can do this with gentle massage or by placing your hands in warm water for 15 minutes. Also, make sure you pay attention to your level of hand pain as you begin an exercise routine. Exercises Knuckle Bend Repeat this exercise 5-10 times with each hand. 1. Stand or sit with your arm, hand, and all five fingers pointed straight up. Make sure your wrist is straight. 2. Gently and slowly bend your fingers down and inward until the tips of your fingers are touching the tops of your palm. 3. Hold this position for a few seconds. 4. Extend your fingers out to their original position, all pointing straight up again.  Finger Fan Repeat this exercise 5-10 times with each hand. 1. Hold your arm and hand out in front of you. Keep your wrist straight. 2. Squeeze your hand into a fist. 3. Hold this  position for a few seconds. 4. Fan out, or spread apart, your hand and fingers as much as possible, stretching every joint fully.  Tabletop Repeat this exercise 5-10 times with each hand. 1. Stand or sit with your arm, hand, and all five fingers pointed straight up. Make sure your wrist is straight. 2. Gently and slowly bend your fingers at the knuckles where they meet the hand until your hand is making an upside-down L shape. Your fingers should form a tabletop. 3. Hold this position for a few seconds. 4. Extend your fingers out to their original position, all pointing straight up again.  Making Os Repeat this exercise 5-10 times with each hand. 1. Stand or sit with your arm, hand, and all five fingers pointed straight up. Make sure your wrist is straight. 2. Make an O shape by touching your pointer finger to your thumb. Hold for a few seconds. Then open your hand wide. 3. Repeat this motion with each finger on your hand.  Table Spread Repeat this exercise 5-10 times with each hand. 1. Place your hand on a table with your palm facing down. Make sure your wrist is straight. 2. Spread your fingers out as much as possible. Hold this position for a few seconds. 3. Slide your fingers back together again. Hold for a few seconds.  Ball Grip  Repeat this exercise 10-15 times with each hand. 1. Hold a tennis ball or another soft ball in your hand. 2. While slowly increasing pressure, squeeze the ball as   hard as possible. 3. Squeeze as hard as you can for 3-5 seconds. 4. Relax and repeat.  Wrist Curls Repeat this exercise 10-15 times with each hand. 1. Sit in a chair that has armrests. 2. Hold a light weight in your hand, such as a dumbbell that weighs 1-3 pounds (0.5-1.4 kg). Ask your health care provider what weight would be best for you. 3. Rest your hand just over the end of the chair arm with your palm facing up. 4. Gently pivot your wrist up and down while holding the weight. Do not  twist your wrist from side to side.  Contact a health care provider if:  Your hand pain or discomfort gets much worse when you do an exercise.  Your hand pain or discomfort does not improve within 2 hours after you exercise. If you have any of these problems, stop doing these exercises right away. Do not do them again unless your health care provider says that you can. Get help right away if:  You develop sudden, severe hand pain. If this happens, stop doing these exercises right away. Do not do them again unless your health care provider says that you can. This information is not intended to replace advice given to you by your health care provider. Make sure you discuss any questions you have with your health care provider. Document Released: 07/17/2015 Document Revised: 01/11/2016 Document Reviewed: 02/13/2015 Elsevier Interactive Patient Education  2018 [employer]  

## 2018-03-13 NOTE — Telephone Encounter (Signed)
Attempted to call patient and left message on machine for patient to call the office.   Please inform patient that xrays showed osteoarthritis, per Dr. Estanislado Pandy.

## 2018-03-16 LAB — RNP ANTIBODY: Ribonucleic Protein(ENA) Antibody, IgG: <1 AI

## 2018-03-16 LAB — C3 AND C4
C3 Complement: 123 mg/dL (ref 83–193)
C4 Complement: 36 mg/dL (ref 15–57)

## 2018-03-16 LAB — SJOGRENS SYNDROME-B EXTRACTABLE NUCLEAR ANTIBODY: SSB (La) (ENA) Antibody, IgG: <1 AI

## 2018-03-16 LAB — SJOGRENS SYNDROME-A EXTRACTABLE NUCLEAR ANTIBODY: SSA (Ro) (ENA) Antibody, IgG: <1 AI

## 2018-03-16 LAB — ANTI-SMITH ANTIBODY: ENA SM Ab Ser-aCnc: <1 AI

## 2018-03-16 LAB — URIC ACID: Uric Acid, Serum: 5.9 mg/dL (ref 2.5–7.0)

## 2018-03-16 LAB — ANTI-DNA ANTIBODY, DOUBLE-STRANDED: ds DNA Ab: <1 IU/mL

## 2018-03-16 LAB — ANTI-SCLERODERMA ANTIBODY: Scleroderma (Scl-70) (ENA) Antibody, IgG: <1 AI

## 2018-03-16 LAB — SEDIMENTATION RATE: Sed Rate: 2 mm/h (ref 0–30)

## 2018-03-16 NOTE — Telephone Encounter (Signed)
Patient advised x-ray showed osteoarthritis. Patient verbalized understanding.

## 2018-03-19 DIAGNOSIS — G4733 Obstructive sleep apnea (adult) (pediatric): Secondary | ICD-10-CM | POA: Diagnosis not present

## 2018-03-27 NOTE — Progress Notes (Signed)
Office Visit Note  Patient: Debbie [name]             Date of Birth: [birth year]           MRN: [id number]             PCP: Lavada Mesi Referring: Lavada Mesi Visit Date: 04/10/2018 Occupation: @GUAROCC @  Subjective:  Pain in hands and feet  History of Present Illness: Debbie [name] is a 57 y.o. female with history of osteoarthritis involving her hands and feet.  She states she has done some modifications exercises.  She has noticed improvement in her symptoms.  She still continues to have discomfort in her right [facility] joint.  She has been using shoes with some arch support which has been helpful.  Denies any joint swelling.  Activities of Daily Living:  Patient reports morning stiffness for 0  minutes.   Patient Denies nocturnal pain.  Difficulty dressing/grooming: Denies Difficulty climbing stairs: Denies Difficulty getting out of chair: Denies Difficulty using hands for taps, buttons, cutlery, and/or writing: Denies  Review of Systems  Constitutional: Negative for fatigue.  HENT: Negative for mouth sores, mouth dryness and nose dryness.   Eyes: Negative for pain, visual disturbance and dryness.  Respiratory: Negative for cough, hemoptysis, shortness of breath and difficulty breathing.   Cardiovascular: Negative for chest pain, palpitations, hypertension and swelling in legs/feet.  Gastrointestinal: Negative for blood in stool, constipation and diarrhea.  Endocrine: Negative for increased urination.  Genitourinary: Negative for painful urination.  Musculoskeletal: Positive for arthralgias and joint pain. Negative for joint swelling, myalgias, muscle weakness, morning stiffness, muscle tenderness and myalgias.  Skin: Negative for color change, pallor, rash, hair loss, nodules/bumps, skin tightness, ulcers and sensitivity to sunlight.  Allergic/Immunologic: Negative for susceptible to infections.  Neurological: Negative for dizziness, numbness,  headaches and weakness.  Hematological: Negative for swollen glands.  Psychiatric/Behavioral: Negative for depressed mood and sleep disturbance. The patient is not nervous/anxious.     PMFS History:  Patient Active Problem List   Diagnosis Date Noted  . Positive ANA (antinuclear antibody) 02/06/2018  . Bilateral hand pain 01/23/2018  . Hand joint stiff, unspecified laterality 01/23/2018  . Periodic limb movement 11/23/2017  . OSA (obstructive sleep apnea) 11/23/2017  . Nocturnal hypoxemia 11/23/2017  . Reactive airway disease, mild intermittent, uncomplicated 93/81/8299  . Snoring 10/20/2017  . Rectocele 10/20/2017  . Non-restorative sleep 10/20/2017  . No energy 10/20/2017  . Chest tightness 07/09/2017  . SOB (shortness of breath) 07/09/2017  . Cough 07/09/2017  . Fungal infection of toenail 01/17/2017  . Large breasts 06/26/2016  . Primary insomnia 06/26/2016  . Mid back pain 06/26/2016  . Chronic shoulder pain 06/26/2016  . Labral tear of shoulder 08/29/2014  . Elevated LFTs 05/11/2013  . Class 1 obesity due to excess calories with serious comorbidity and body mass index (BMI) of 30.0 to 30.9 in adult 03/05/2013  . Gastritis 02/17/2013  . History of hysterectomy 01/04/2013  . History of shingles 01/04/2013  . Essential hypertension, benign 12/01/2012  . Generalized anxiety disorder 12/01/2012  . Family history of colon cancer 12/01/2012    Past Medical History:  Diagnosis Date  . Anxiety   . Asthma    with severe URIs  . Bronchitis   . Essential hypertension, benign 12/01/2012  . Generalized anxiety disorder 12/01/2012  . Hypertension   . Seasonal allergies     Family History  Problem Relation Age of Onset  . Hypertension Mother   .  Rheum arthritis Mother   . Hypertension Father   . Alcoholism Father   . Colon cancer Father   . Lung cancer Father   . Depression Father   . Diabetes Father   . Skin cancer Father   . Hypertension Brother   . Stroke Brother     . Atrial fibrillation Brother   . Hyperlipidemia Maternal Grandfather   . Stroke Paternal Grandmother   . Healthy Daughter   . Healthy Daughter    Past Surgical History:  Procedure Laterality Date  . ABDOMINAL HYSTERECTOMY  2011  . ANKLE SURGERY    . BREAST CYST EXCISION Bilateral   . BREAST EXCISIONAL BIOPSY Bilateral   . CHOLECYSTECTOMY  2012  . KNEE ARTHROSCOPY W/ MEDIAL COLLATERAL LIGAMENT (MCL) REPAIR Left 10-07-2012  . TONSILLECTOMY  1969   Social History   Social History Narrative  . Not on file    Objective: Vital Signs: BP 101/67 (BP Location: Left Arm, Patient Position: Sitting, Cuff Size: Normal)   Pulse 76   Resp 14   Ht 5\' 6"  (1.676 m)   Wt 176 lb 6.4 oz (80 kg)   BMI 28.47 kg/m    Physical Exam  Constitutional: She is oriented to person, place, and time. She appears well-developed and well-nourished.  HENT:  Head: Normocephalic and atraumatic.  Eyes: Conjunctivae and EOM are normal.  Neck: Normal range of motion.  Cardiovascular: Normal rate, regular rhythm, normal heart sounds and intact distal pulses.  Pulmonary/Chest: Effort normal and breath sounds normal.  Abdominal: Soft. Bowel sounds are normal.  Lymphadenopathy:    She has no cervical adenopathy.  Neurological: She is alert and oriented to person, place, and time.  Skin: Skin is warm and dry. Capillary refill takes less than 2 seconds.  Psychiatric: She has a normal mood and affect. Her behavior is normal.  Nursing note and vitals reviewed.    Musculoskeletal Exam: Spine thoracic lumbar spine good range of motion.  Shoulder joints elbow joints wrist joints were in good range of motion.  She has bilateral CMC PIP and DIP thickening.  She had right CMC discomfort.  Hip joints knee joints ankle joints were in good range of motion.  She has bilateral PIP/DIP thickening in her feet with hammertoes.  CDAI Exam: CDAI Score: Not documented Patient Global Assessment: Not documented; Provider Global  Assessment: Not documented Swollen: Not documented; Tender: Not documented Joint Exam   Not documented   There is currently no information documented on the homunculus. Go to the Rheumatology activity and complete the homunculus joint exam.  Investigation: No additional findings.  Imaging: Xr Foot 2 Views Left  Result Date: 03/13/2018 First MTP, PIP and DIP narrowing was noted.  No erosive changes were noted.  No intertarsal joint space narrowing was noted.  Dorsal spurring and calcaneal spur was noted. Impression: These findings are consistent with osteoarthritis of the foot.  Xr Foot 2 Views Right  Result Date: 03/13/2018 First MTP, all PIP and DIP narrowing was noted.  No erosive changes were noted.  Dorsal spur was noted.  Calcaneal spur was noted.  Hardware noted in the right fibula. Impression: These findings are consistent with osteoarthritis of the foot.  Xr Hand 2 View Left  Result Date: 03/13/2018 PIP and DIP narrowing was noted.  Severe CMC narrowing was noted.  No MCP intercarpal radiocarpal joint space narrowing was noted.  No erosive changes were noted. Impression: These findings are consistent with osteoarthritis of the hand.  Xr Hand 2  View Right  Result Date: 03/13/2018 PIP and DIP narrowing was noted.  Severe CMC narrowing was noted.  No MCP intercarpal radiocarpal joint space narrowing was noted.  No erosive changes were noted. Impression: These findings are consistent with osteoarthritis of the hand.   Recent Labs: Lab Results  Component Value Date   NA 143 07/08/2017   K 3.7 07/08/2017   CL 104 07/08/2017   CO2 33 (H) 07/08/2017   GLUCOSE 91 07/08/2017   BUN 16 07/08/2017   CREATININE 0.66 07/08/2017   BILITOT 0.7 07/08/2017   AST 23 07/08/2017   ALT 21 07/08/2017   PROT 6.7 07/08/2017   CALCIUM 9.9 07/08/2017   GFRAA 114 07/08/2017  03/13/18 C3, C4 normal, ENA -, ESR2, uric acid 5.9 02/03/18 ANA 1:160 NH  Speciality Comments: No specialty comments  available.  Procedures:  No procedures performed Allergies: Azithromycin; Erythromycin; Levofloxacin; and Penicillins   Assessment / Plan:     Visit Diagnoses: Primary osteoarthritis of both hands-she has been taking natural supplements and also has been practicing joint protection which has been helpful.  She has been having some discomfort in her right [place].  Prescription for right CMC brace was given.  Primary osteoarthritis of both feet-she is osteoarthritis in her feet with hammertoes.  She also has some calluses on her feet.  Have advised her to see a podiatrist.  Proper fitting shoes with arch support were discussed.  Positive ANA (antinuclear antibody) - ENA, C3, C4 negative.  She has no clinical features of autoimmune disease.  All her labs are unremarkable.  Essential hypertension, benign  Generalized anxiety disorder  Primary insomnia  OSA (obstructive sleep apnea)   Orders: No orders of the defined types were placed in this encounter.  No orders of the defined types were placed in this encounter.     Follow-Up Instructions: Return if symptoms worsen or fail to improve, for Osteoarthritis.   Bo Merino, MD  Note - This record has been created using Editor, commissioning.  Chart creation errors have been sought, but may not always  have been located. Such creation errors do not reflect on  the standard of medical care.

## 2018-03-30 ENCOUNTER — Emergency Department
Admission: EM | Admit: 2018-03-30 | Discharge: 2018-03-30 | Disposition: A | Discharge status: Home / Self Care | Payer: BLUE CROSS/BLUE SHIELD | Source: Home / Self Care | Attending: Family Medicine | Admitting: Family Medicine

## 2018-03-30 ENCOUNTER — Encounter: Payer: Self-pay | Admitting: Emergency Medicine

## 2018-03-30 ENCOUNTER — Encounter: Payer: Self-pay | Admitting: Physician Assistant

## 2018-03-30 DIAGNOSIS — J9801 Acute bronchospasm: Secondary | ICD-10-CM

## 2018-03-30 DIAGNOSIS — B9789 Other viral agents as the cause of diseases classified elsewhere: Secondary | ICD-10-CM

## 2018-03-30 DIAGNOSIS — J069 Acute upper respiratory infection, unspecified: Secondary | ICD-10-CM | POA: Diagnosis not present

## 2018-03-30 MED ORDER — BENZONATATE 200 MG PO CAPS: ORAL_CAPSULE | ORAL | 0 refills | Status: DC

## 2018-03-30 MED ORDER — DOXYCYCLINE HYCLATE 100 MG PO CAPS: 100.0000 mg | ORAL_CAPSULE | Freq: Two times a day (BID) | ORAL | 0 refills | Status: DC

## 2018-03-30 MED ORDER — PREDNISONE 20 MG PO TABS: ORAL_TABLET | ORAL | 0 refills | Status: DC

## 2018-03-30 MED ORDER — METHYLPREDNISOLONE SODIUM SUCC 125 MG IJ SOLR
80.0000 mg | Freq: Once | INTRAMUSCULAR | Status: AC
  Administered 2018-03-30: 80 mg via INTRAMUSCULAR

## 2018-03-30 NOTE — ED Triage Notes (Signed)
Pt c/o fever of 101, chest congestion and cough x10 days. Tried OTC meds with no relief.

## 2018-03-30 NOTE — Discharge Instructions (Addendum)
Begin prednisone Tuesday 03/31/18. Take plain guaifenesin (1200mg  extended release tabs such as Mucinex) twice daily, with plenty of water, for cough and congestion.  Get adequate rest.   May use Afrin nasal spray (or generic oxymetazoline) each morning for about 5 days and then discontinue.  Also recommend using saline nasal spray several times daily and saline nasal irrigation (AYR is a common brand).  Use Flonase nasal spray each morning after using Afrin nasal spray and saline nasal irrigation. Try warm salt water gargles for sore throat.  Stop all antihistamines for now, and other non-prescription cough/cold preparations. Continue ProAir inhaler as needed.  Continue Singulair. May take Delsym Cough Suppressant with Tessalon at bedtime for nighttime cough.  Begin Doxycycline if not improving about 5 days or if persistent fever develops

## 2018-03-30 NOTE — ED Provider Notes (Signed)
[name]    CSN: [id number] Arrival date & time: 03/30/18  1143     History   Chief Complaint Chief Complaint  Patient presents with  . Cough    HPI Debbie [name] is a 57 y.o. female.   Patient has well controlled asthma and 10 days ago she began to develop increased chest congestion.  She thought that she was have an asthma flare, but during the next two days she became fatigued with onset of sore neck and throat, and increased sinus congestion.  She has had increased wheezing and has had to use her ProAir inhaler more often.  During the past 3 days she has had fever as high as 101 (none today).   No pleuritic pain. She has had pneumonia in the past.  The history is provided by the patient.    Past Medical History:  Diagnosis Date  . Anxiety   . Asthma    with severe URIs  . Bronchitis   . Essential hypertension, benign 12/01/2012  . Generalized anxiety disorder 12/01/2012  . Hypertension   . Seasonal allergies     Patient Active Problem List   Diagnosis Date Noted  . Positive ANA (antinuclear antibody) 02/06/2018  . Bilateral hand pain 01/23/2018  . Hand joint stiff, unspecified laterality 01/23/2018  . Periodic limb movement 11/23/2017  . OSA (obstructive sleep apnea) 11/23/2017  . Nocturnal hypoxemia 11/23/2017  . Reactive airway disease, mild intermittent, uncomplicated 39/76/7341  . Snoring 10/20/2017  . Rectocele 10/20/2017  . Non-restorative sleep 10/20/2017  . No energy 10/20/2017  . Chest tightness 07/09/2017  . SOB (shortness of breath) 07/09/2017  . Cough 07/09/2017  . Fungal infection of toenail 01/17/2017  . Large breasts 06/26/2016  . Primary insomnia 06/26/2016  . Mid back pain 06/26/2016  . Chronic shoulder pain 06/26/2016  . Labral tear of shoulder 08/29/2014  . Elevated LFTs 05/11/2013  . Class 1 obesity due to excess calories with serious comorbidity and body mass index (BMI) of 30.0 to 30.9 in adult 03/05/2013  .  Gastritis 02/17/2013  . History of hysterectomy 01/04/2013  . History of shingles 01/04/2013  . Essential hypertension, benign 12/01/2012  . Generalized anxiety disorder 12/01/2012  . Family history of colon cancer 12/01/2012    Past Surgical History:  Procedure Laterality Date  . ABDOMINAL HYSTERECTOMY  2011  . ANKLE SURGERY    . BREAST CYST EXCISION Bilateral   . BREAST EXCISIONAL BIOPSY Bilateral   . CHOLECYSTECTOMY  2012  . KNEE ARTHROSCOPY W/ MEDIAL COLLATERAL LIGAMENT (MCL) REPAIR Left 10-07-2012  . TONSILLECTOMY  1969    OB History   None      Home Medications    Prior to Admission medications   Medication Sig Start Date End Date Taking? Authorizing Provider  albuterol (PROVENTIL HFA;VENTOLIN HFA) 108 (90 Base) MCG/ACT inhaler Inhale 2 puffs into the lungs every 4 (four) hours as needed for wheezing. 10/20/17   Breeback, Jade L, PA-C  AMBULATORY NON FORMULARY MEDICATION Continuous positive airway pressure (CPAP) machine set at autopap, with all supplemental supplies as needed. 11/30/17   Breeback, Jade L, PA-C  benzonatate (TESSALON) 200 MG capsule Take one cap by mouth at bedtime as needed for cough.  May repeat in 4 to 6 hours 03/30/18   Kandra Nicolas, MD  buPROPion (WELLBUTRIN XL) 150 MG 24 hr tablet Take 1 tablet (150 mg total) by mouth every morning. 01/20/18   Breeback, Jade L, PA-C  desonide (DESOWEN) 0.05 %  cream Apply topically 2 (two) times daily. Use for up to two weeks. Patient not taking: Reported on 03/13/2018 10/10/16   Donella Stade, PA-C  doxycycline (VIBRAMYCIN) 100 MG capsule Take 1 capsule (100 mg total) by mouth 2 (two) times daily. Take with food (Rx void after 04/09/18) 03/30/18   Kandra Nicolas, MD  Liraglutide -Weight Management (SAXENDA) 18 MG/3ML SOPN Inject 3 mg into the skin daily. Please include ultra fine needles 1mm 01/20/18   Breeback, Jade L, PA-C  losartan-hydrochlorothiazide (HYZAAR) 100-25 MG tablet TAKE 1 TABLET DAILY Patient taking  differently: TAKE 1/2 TABLET DAILY 11/11/17   Breeback, Jade L, PA-C  montelukast (SINGULAIR) 10 MG tablet Take 1 tablet (10 mg total) by mouth at bedtime. Patient not taking: Reported on 03/13/2018 12/02/17   Donella Stade, PA-C  pantoprazole (PROTONIX) 40 MG tablet TAKE 1 TABLET DAILY 10/27/17   Breeback, Jade L, PA-C  predniSONE (DELTASONE) 20 MG tablet Take one tab by mouth twice daily for 4 days, then one daily for 3 days. Take with food. 03/30/18   Kandra Nicolas, MD  rOPINIRole (REQUIP) 0.25 MG tablet 1-3 tablets at bedtime for restless leg syndrome. Patient not taking: Reported on 03/13/2018 03/02/18   Donella Stade, PA-C  valACYclovir (VALTREX) 1000 MG tablet TAKE 2 TABLETS EVERY 12 HOURS FOR ONE DAY, TAKE AT THE ONSET OF COLD SORE Patient taking differently: as needed. TAKE 2 TABLETS EVERY 12 HOURS FOR ONE DAY, TAKE AT THE ONSET OF COLD SORE 10/20/17   Donella Stade, PA-C    Family History Family History  Problem Relation Age of Onset  . Hypertension Mother   . Rheum arthritis Mother   . Hypertension Father   . Alcoholism Father   . Colon cancer Father   . Lung cancer Father   . Depression Father   . Diabetes Father   . Skin cancer Father   . Hypertension Brother   . Hyperlipidemia Maternal Grandfather   . Stroke Paternal Grandmother   . Healthy Daughter   . Healthy Daughter     Social History Social History   Tobacco Use  . Smoking status: Never Smoker  . Smokeless tobacco: Never Used  Substance Use Topics  . Alcohol use: Yes    Comment: 2 q mth  . Drug use: No     Allergies   Azithromycin; Erythromycin; Levofloxacin; and Penicillins   Review of Systems Review of Systems + sore throat + cough No pleuritic pain + wheezing + nasal congestion + post-nasal drainage No sinus pain/pressure No itchy/red eyes No earache No hemoptysis + SOB + fever, + chills No nausea No vomiting No abdominal pain No diarrhea No urinary symptoms No skin rash +  fatigue + myalgias No headache Used OTC meds without relief   Physical Exam Triage Vital Signs ED Triage Vitals [03/30/18 1213]  Enc Vitals Group     BP 116/80     Pulse Rate 80     Resp      Temp 98.5 F (36.9 C)     Temp Source Oral     SpO2 97 %     Weight 182 lb (82.6 kg)     Height      Head Circumference      Peak Flow      Pain Score 0     Pain Loc      Pain Edu?      Excl. in [place]?    No data found.  Updated Vital Signs BP 116/80 (BP Location: Right Arm)   Pulse 80   Temp 98.5 F (36.9 C) (Oral)   Wt 82.6 kg   SpO2 97%   BMI 29.38 kg/m   Visual Acuity Right Eye Distance:   Left Eye Distance:   Bilateral Distance:    Right Eye Near:   Left Eye Near:    Bilateral Near:     Physical Exam Nursing notes and Vital Signs reviewed. Appearance:  Patient appears stated age, and in no acute distress Eyes:  Pupils are equal, round, and reactive to light and accomodation.  Extraocular movement is intact.  Conjunctivae are not inflamed  Ears:  Canals normal.  Tympanic membranes normal.  Nose:  Congested turbinates.  No sinus tenderness.    Pharynx:  Normal Neck:  Supple.  Enlarged posterior/lateral nodes are palpated bilaterally, tender to palpation on the left.   Lungs:  Clear to auscultation.  Breath sounds are equal.  Moving air well. Heart:  Regular rate and rhythm without murmurs, rubs, or gallops.  Abdomen:  Nontender without masses or hepatosplenomegaly.  Bowel sounds are present.  No CVA or flank tenderness.  Extremities:  No edema.  Skin:  No rash present.    UC Treatments / Results  Labs (all labs ordered are listed, but only abnormal results are displayed) Labs Reviewed - No data to display  EKG None  Radiology No results found.  Procedures Procedures (including critical care time)  Medications Ordered in UC Medications  methylPREDNISolone sodium succinate (SOLU-MEDROL) 125 mg/2 mL injection 80 mg (has no administration in time range)     Initial Impression / Assessment and Plan / UC Course  I have reviewed the triage vital signs and the nursing notes.  Pertinent labs & imaging results that were available during my care of the patient were reviewed by me and considered in my medical decision making (see chart for details).    There is no evidence of bacterial infection today.   Administered Solumedrol 80mg  IM.  Prescription written for Benzonatate [facility]) to take at bedtime for night-time cough.  Followup with Family Doctor if not improved in about one week.   Final Clinical Impressions(s) / UC Diagnoses   Final diagnoses:  Viral URI with cough  Bronchospasm     Discharge Instructions     Begin prednisone burst/taper Tuesday 03/31/18. Take plain guaifenesin (1200mg  extended release tabs such as Mucinex) twice daily, with plenty of water, for cough and congestion.  Get adequate rest.   May use Afrin nasal spray (or generic oxymetazoline) each morning for about 5 days and then discontinue.  Also recommend using saline nasal spray several times daily and saline nasal irrigation (AYR is a common brand).  Use Flonase nasal spray each morning after using Afrin nasal spray and saline nasal irrigation. Try warm salt water gargles for sore throat.  Stop all antihistamines for now, and other non-prescription cough/cold preparations. Continue ProAir inhaler as needed.  Continue Singulair. May take Delsym Cough Suppressant with Tessalon at bedtime for nighttime cough. (Given a prescription to hold, with an expiration date)   Begin Doxycycline if not improving about 5 days or if persistent fever develops       ED Prescriptions    Medication Sig Dispense Auth. Provider   predniSONE (DELTASONE) 20 MG tablet Take one tab by mouth twice daily for 4 days, then one daily for 3 days. Take with food. 11 tablet Kandra Nicolas, MD   benzonatate (TESSALON) 200 MG  capsule Take one cap by mouth at bedtime as needed for cough.  May  repeat in 4 to 6 hours 15 capsule Kandra Nicolas, MD   doxycycline (VIBRAMYCIN) 100 MG capsule Take 1 capsule (100 mg total) by mouth 2 (two) times daily. Take with food (Rx void after 04/09/18) 14 capsule Kandra Nicolas, MD         Kandra Nicolas, MD 03/30/18 [phone]

## 2018-04-03 ENCOUNTER — Telehealth: Payer: Self-pay | Admitting: Physician Assistant

## 2018-04-03 NOTE — Telephone Encounter (Signed)
Message from Plan 04/03/2018 CaseId:[id number];Status:Approved;Review Type:Prior Auth;Coverage Start Date:03/04/2018;Coverage End Date:08/01/2018;

## 2018-04-10 ENCOUNTER — Encounter: Payer: Self-pay | Admitting: Rheumatology

## 2018-04-10 ENCOUNTER — Ambulatory Visit: Payer: BLUE CROSS/BLUE SHIELD | Admitting: Rheumatology

## 2018-04-10 VITALS — BP 101/67 | HR 76 | Resp 14 | Ht 66.0 in | Wt 176.4 lb

## 2018-04-10 DIAGNOSIS — M19072 Primary osteoarthritis, left ankle and foot: Secondary | ICD-10-CM

## 2018-04-10 DIAGNOSIS — R768 Other specified abnormal immunological findings in serum: Secondary | ICD-10-CM

## 2018-04-10 DIAGNOSIS — F411 Generalized anxiety disorder: Secondary | ICD-10-CM

## 2018-04-10 DIAGNOSIS — M19071 Primary osteoarthritis, right ankle and foot: Secondary | ICD-10-CM

## 2018-04-10 DIAGNOSIS — I10 Essential (primary) hypertension: Secondary | ICD-10-CM

## 2018-04-10 DIAGNOSIS — M19041 Primary osteoarthritis, right hand: Secondary | ICD-10-CM

## 2018-04-10 DIAGNOSIS — F5101 Primary insomnia: Secondary | ICD-10-CM

## 2018-04-10 DIAGNOSIS — G4733 Obstructive sleep apnea (adult) (pediatric): Secondary | ICD-10-CM

## 2018-04-10 DIAGNOSIS — M19042 Primary osteoarthritis, left hand: Secondary | ICD-10-CM

## 2018-04-19 DIAGNOSIS — G4733 Obstructive sleep apnea (adult) (pediatric): Secondary | ICD-10-CM | POA: Diagnosis not present

## 2018-04-28 ENCOUNTER — Encounter: Payer: Self-pay | Admitting: Physician Assistant

## 2018-04-28 ENCOUNTER — Ambulatory Visit: Payer: BLUE CROSS/BLUE SHIELD | Admitting: Physician Assistant

## 2018-04-28 VITALS — BP 105/64 | HR 74 | Temp 98.0°F | Ht 66.0 in | Wt 181.0 lb

## 2018-04-28 DIAGNOSIS — G4733 Obstructive sleep apnea (adult) (pediatric): Secondary | ICD-10-CM

## 2018-04-28 DIAGNOSIS — I10 Essential (primary) hypertension: Secondary | ICD-10-CM | POA: Diagnosis not present

## 2018-04-28 DIAGNOSIS — M19041 Primary osteoarthritis, right hand: Secondary | ICD-10-CM

## 2018-04-28 DIAGNOSIS — Z823 Family history of stroke: Secondary | ICD-10-CM

## 2018-04-28 DIAGNOSIS — M19042 Primary osteoarthritis, left hand: Secondary | ICD-10-CM

## 2018-04-28 DIAGNOSIS — M19071 Primary osteoarthritis, right ankle and foot: Secondary | ICD-10-CM

## 2018-04-28 DIAGNOSIS — E663 Overweight: Secondary | ICD-10-CM

## 2018-04-28 DIAGNOSIS — I959 Hypotension, unspecified: Secondary | ICD-10-CM | POA: Diagnosis not present

## 2018-04-28 DIAGNOSIS — M19072 Primary osteoarthritis, left ankle and foot: Secondary | ICD-10-CM

## 2018-04-28 MED ORDER — LIRAGLUTIDE -WEIGHT MANAGEMENT 18 MG/3ML ~~LOC~~ SOPN: 3.0000 mg | PEN_INJECTOR | Freq: Every day | SUBCUTANEOUS | 5 refills | Status: DC

## 2018-04-28 NOTE — Progress Notes (Signed)
l °

## 2018-04-28 NOTE — Patient Instructions (Addendum)
Vitamin d at least 1000 units a day and calcium 1300mg  or 4 servings of dairy.  Tumeric 500mg  twice a day.  Glucosamine chondrotin

## 2018-04-29 ENCOUNTER — Encounter: Payer: Self-pay | Admitting: Physician Assistant

## 2018-04-29 DIAGNOSIS — Z823 Family history of stroke: Secondary | ICD-10-CM | POA: Insufficient documentation

## 2018-04-29 DIAGNOSIS — I959 Hypotension, unspecified: Secondary | ICD-10-CM

## 2018-04-29 DIAGNOSIS — M19071 Primary osteoarthritis, right ankle and foot: Secondary | ICD-10-CM | POA: Insufficient documentation

## 2018-04-29 DIAGNOSIS — M19072 Primary osteoarthritis, left ankle and foot: Secondary | ICD-10-CM

## 2018-04-29 DIAGNOSIS — M19041 Primary osteoarthritis, right hand: Secondary | ICD-10-CM

## 2018-04-29 DIAGNOSIS — M19042 Primary osteoarthritis, left hand: Secondary | ICD-10-CM

## 2018-04-29 HISTORY — DX: Hypotension, unspecified: I95.9

## 2018-04-29 HISTORY — DX: Family history of stroke: Z82.3

## 2018-04-29 HISTORY — DX: Primary osteoarthritis, right hand: M19.041

## 2018-04-29 HISTORY — DX: Primary osteoarthritis, left ankle and foot: M19.071

## 2018-04-29 NOTE — Progress Notes (Signed)
Subjective:    Patient ID: Debbie [name], female    DOB: 1961/06/16, 57 y.o.   MRN: [id number]  HPI Patient is a 57 year old female with HTN, OSA, overweight who presents to the clinic to follow-up on hypertension and obesity.  Patient has been noticing that when she checks her blood pressure it has been in the low 100s on top and 60 to 70s on the bottom.  At times she has noticed some dizziness.  She is already taking 1/2 tablet of her Hyzaar.  She denies any chest pain, palpitations, vision changes.  She wonders if there needs to be another medication change.  She has lost most of 100 pounds over the last year and a half.  She is exercising regularly.  She still uses [place].  This is helped her significantly.  She still has a goal of losing 20 more pounds.  She does note that her brother had a stroke recently.  She has stopped wellbutrin. Was taking for focus. Since starting cPAP for OSA has not needed. She has so much energy and feels so much better on CPAP. She is using almost every night at above 90 percent of night.   .. Active Ambulatory Problems    Diagnosis Date Noted  . Essential hypertension, benign 12/01/2012  . Generalized anxiety disorder 12/01/2012  . Family history of colon cancer 12/01/2012  . History of hysterectomy 01/04/2013  . History of shingles 01/04/2013  . Gastritis 02/17/2013  . Class 1 obesity due to excess calories with serious comorbidity and body mass index (BMI) of 30.0 to 30.9 in adult 03/05/2013  . Elevated LFTs 05/11/2013  . Labral tear of shoulder 08/29/2014  . Large breasts 06/26/2016  . Primary insomnia 06/26/2016  . Mid back pain 06/26/2016  . Chronic shoulder pain 06/26/2016  . Fungal infection of toenail 01/17/2017  . Chest tightness 07/09/2017  . SOB (shortness of breath) 07/09/2017  . Cough 07/09/2017  . Snoring 10/20/2017  . Rectocele 10/20/2017  . Non-restorative sleep 10/20/2017  . No energy 10/20/2017  . Reactive airway  disease, mild intermittent, uncomplicated 53/97/6734  . Periodic limb movement 11/23/2017  . OSA (obstructive sleep apnea) 11/23/2017  . Nocturnal hypoxemia 11/23/2017  . Bilateral hand pain 01/23/2018  . Hand joint stiff, unspecified laterality 01/23/2018  . Positive ANA (antinuclear antibody) 02/06/2018  . Hypotension 04/29/2018  . Family history of stroke 04/29/2018  . Primary osteoarthritis of both hands 04/29/2018  . Primary osteoarthritis of both feet 04/29/2018   Resolved Ambulatory Problems    Diagnosis Date Noted  . No Resolved Ambulatory Problems   Past Medical History:  Diagnosis Date  . Anxiety   . Asthma   . Bronchitis   . Hypertension   . Seasonal allergies    .Marland Kitchen Family History  Problem Relation Age of Onset  . Hypertension Mother   . Rheum arthritis Mother   . Hypertension Father   . Alcoholism Father   . Colon cancer Father   . Lung cancer Father   . Depression Father   . Diabetes Father   . Skin cancer Father   . Hypertension Brother   . Stroke Brother   . Atrial fibrillation Brother   . Hyperlipidemia Maternal Grandfather   . Stroke Paternal Grandmother   . Healthy Daughter   . Healthy Daughter       Review of Systems  All other systems reviewed and are negative.      Objective:   Physical Exam  Constitutional: She  is oriented to person, place, and time. She appears well-developed and well-nourished.  HENT:  Head: Normocephalic and atraumatic.  Cardiovascular: Normal rate and regular rhythm.  Pulmonary/Chest: Effort normal and breath sounds normal.  Neurological: She is alert and oriented to person, place, and time.  Psychiatric: She has a normal mood and affect. Her behavior is normal.          Assessment & Plan:  Marland KitchenMarland KitchenPeggie was seen today for follow-up.  Diagnoses and all orders for this visit:  Hypotension, unspecified hypotension type  Overweight (BMI 25.0-29.9) -     Liraglutide -Weight Management (SAXENDA) 18 MG/3ML  SOPN; Inject 3 mg into the skin daily. Please include ultra fine needles 70mm  Essential hypertension, benign  OSA (obstructive sleep apnea)  Family history of stroke  Primary osteoarthritis of both hands  Primary osteoarthritis of both feet   BP is low today. Stop hyzaar. Check BP at home and follow up nurse visit in 1 month.   Pt continues to weight loss journey. Refilled saxenda.   Continue on CPAP.   Discussed stroke risk with recent family hx. Last LDL was 06/2017 and looked fantastic. She has continued to lose weight and exercise since then. Her BP is not a risk factor being low. She had carotid u/s 2 years ago which looked good and does not have DM. She does not smoke or drink alcohol. Her risk are as low as they are going to get.   Discussed osteoarthritis as cause of joint pain. She would like to treat naturally. Consider tumeric regularly and glucosamine chondrotin. Keep exercising. Watch boxing as source of pain.   Marland Kitchen.Spent 30 minutes with patient and greater than 50 percent of visit spent counseling patient regarding treatment plan.

## 2018-04-30 MED ORDER — LIRAGLUTIDE -WEIGHT MANAGEMENT 18 MG/3ML ~~LOC~~ SOPN: 3.0000 mg | PEN_INJECTOR | Freq: Every day | SUBCUTANEOUS | 1 refills | Status: DC

## 2018-05-19 DIAGNOSIS — G4733 Obstructive sleep apnea (adult) (pediatric): Secondary | ICD-10-CM | POA: Diagnosis not present

## 2018-05-25 ENCOUNTER — Encounter: Payer: Self-pay | Admitting: Physician Assistant

## 2018-06-11 ENCOUNTER — Other Ambulatory Visit: Payer: Self-pay | Admitting: Physician Assistant

## 2018-06-11 DIAGNOSIS — J4520 Mild intermittent asthma, uncomplicated: Secondary | ICD-10-CM

## 2018-06-19 DIAGNOSIS — G4733 Obstructive sleep apnea (adult) (pediatric): Secondary | ICD-10-CM | POA: Diagnosis not present

## 2018-06-29 ENCOUNTER — Encounter: Payer: Self-pay | Admitting: Physician Assistant

## 2018-07-03 ENCOUNTER — Encounter: Payer: Self-pay | Admitting: Physician Assistant

## 2018-07-07 MED ORDER — HYDROCHLOROTHIAZIDE 12.5 MG PO TABS: 12.5000 mg | ORAL_TABLET | Freq: Every day | ORAL | 2 refills | Status: DC

## 2018-07-13 ENCOUNTER — Telehealth: Payer: Self-pay | Admitting: Physician Assistant

## 2018-07-13 ENCOUNTER — Ambulatory Visit: Payer: BLUE CROSS/BLUE SHIELD | Admitting: Physician Assistant

## 2018-07-13 ENCOUNTER — Encounter: Payer: Self-pay | Admitting: Physician Assistant

## 2018-07-13 VITALS — BP 148/68 | HR 67 | Temp 98.4°F | Wt 186.0 lb

## 2018-07-13 DIAGNOSIS — I10 Essential (primary) hypertension: Secondary | ICD-10-CM | POA: Diagnosis not present

## 2018-07-13 DIAGNOSIS — R197 Diarrhea, unspecified: Secondary | ICD-10-CM

## 2018-07-13 DIAGNOSIS — K580 Irritable bowel syndrome with diarrhea: Secondary | ICD-10-CM | POA: Diagnosis not present

## 2018-07-13 MED ORDER — RIFAXIMIN 550 MG PO TABS: 550.0000 mg | ORAL_TABLET | Freq: Three times a day (TID) | ORAL | 0 refills | Status: DC

## 2018-07-13 NOTE — Patient Instructions (Signed)
Diet for Irritable Bowel Syndrome When you have irritable bowel syndrome (IBS), the foods you eat and your eating habits are very important. IBS may cause various symptoms, such as abdominal pain, constipation, or diarrhea. Choosing the right foods can help ease discomfort caused by these symptoms. Work with your health care provider and dietitian to find the best eating plan to help control your symptoms. What general guidelines do I need to follow?  Keep a food diary. This will help you identify foods that cause symptoms. Write down: ? What you eat and when. ? What symptoms you have. ? When symptoms occur in relation to your meals.  Avoid foods that cause symptoms. Talk with your dietitian about other ways to get the same nutrients that are in these foods.  Eat more foods that contain fiber. Take a fiber supplement if directed by your dietitian.  Eat your meals slowly, in a relaxed setting.  Aim to eat 5-6 small meals per day. Do not skip meals.  Drink enough fluids to keep your urine clear or pale yellow.  Ask your health care provider if you should take an over-the-counter probiotic during flare-ups to help restore healthy gut bacteria.  If you have cramping or diarrhea, try making your meals low in fat and high in carbohydrates. Examples of carbohydrates are pasta, rice, whole grain breads and cereals, fruits, and vegetables.  If dairy products cause your symptoms to flare up, try eating less of them. You might be able to handle yogurt better than other dairy products because it contains bacteria that help with digestion. What foods are not recommended? The following are some foods and drinks that may worsen your symptoms:  Fatty foods, such as French fries.  Milk products, such as cheese or ice cream.  Chocolate.  Alcohol.  Products with caffeine, such as coffee.  Carbonated drinks, such as soda.  The items listed above may not be a complete list of foods and beverages to  avoid. Contact your dietitian for more information. What foods are good sources of fiber? Your health care provider or dietitian may recommend that you eat more foods that contain fiber. Fiber can help reduce constipation and other IBS symptoms. Add foods with fiber to your diet a little at a time so that your body can get used to them. Too much fiber at once might cause gas and swelling of your abdomen. The following are some foods that are good sources of fiber:  Apples.  Peaches.  Pears.  Berries.  Figs.  Broccoli (raw).  Cabbage.  Carrots.  Raw peas.  Kidney beans.  Lima beans.  Whole grain bread.  Whole grain cereal.  Where to find more information: International Foundation for Functional Gastrointestinal Disorders: www.iffgd.org National Institute of Diabetes and Digestive and Kidney Diseases: www.niddk.nih.gov/health-information/health-topics/digestive-diseases/ibs/Pages/facts.aspx This information is not intended to replace advice given to you by your health care provider. Make sure you discuss any questions you have with your health care provider. Document Released: 10/26/2003 Document Revised: 01/11/2016 Document Reviewed: 11/05/2013 Elsevier Interactive Patient Education  2018 Elsevier Inc.  

## 2018-07-13 NOTE — Telephone Encounter (Signed)
Message from [facility]) CaseId:[id number];Status:Approved;Review Type:Prior Auth;Coverage Start Date:06/13/2018;Coverage End Date:11/10/2018; Pharmacy aware.

## 2018-07-13 NOTE — Progress Notes (Signed)
Subjective:    Patient ID: Debbie [name], female    DOB: 06-Mar-1961, 57 y.o.   MRN: [id number]  HPI  Patient is a 57 year old female with hypertension, OSA who presents to the clinic with ongoing frequent loose stools for the last 10 to 15 years.  She is actually seen gastroenterology and been diagnosed with IBS-D.  She has had an endoscopy and colonoscopy which were negative for any worrisome findings.  She feels like they have not tried any medications to help with diarrhea.  She was put on pantoprazole but that did not help with any of her GI symptoms.  She hates knowing that she is going to have to have a bowel movement within 15 minutes of eating anything.  She is not aware of any foods that make this worse.  She has frequent accidents when she cannot get to the bathroom in time.  She feels like her life is always around getting to the bathroom.  She has been on a probiotic which has helped some but she is out of it right now.  A probiotic mostly help with making her stools more solid.  She wonders if she could be sensitive to any foods or if there is anything else she can do for this. She has some gas but no real abdominal pain.   .. Family History  Problem Relation Age of Onset  . Hypertension Mother   . Rheum arthritis Mother   . Hypertension Father   . Alcoholism Father   . Colon cancer Father   . Lung cancer Father   . Depression Father   . Diabetes Father   . Skin cancer Father   . Hypertension Brother   . Stroke Brother   . Atrial fibrillation Brother   . Hyperlipidemia Maternal Grandfather   . Stroke Paternal Grandmother   . Healthy Daughter   . Healthy Daughter       Review of Systems    see HPI.  Objective:   Physical Exam  Constitutional: She is oriented to person, place, and time. She appears well-developed and well-nourished.  HENT:  Head: Normocephalic and atraumatic.  Cardiovascular: Normal rate and regular rhythm.  Pulmonary/Chest: Effort normal.   Abdominal: Soft. Bowel sounds are normal. She exhibits no distension. There is no tenderness. There is no rebound and no guarding.  Neurological: She is alert and oriented to person, place, and time.  Psychiatric: She has a normal mood and affect. Her behavior is normal.          Assessment & Plan:  Marland KitchenMarland KitchenDiagnoses and all orders for this visit:  Irritable bowel syndrome with diarrhea -     Reticulin Antibody, IgA w titer -     Food Specific IgG Allergy( Adult) -     rifaximin (XIFAXAN) 550 MG TABS tablet; Take 1 tablet (550 mg total) by mouth 3 (three) times daily. For 14 days.  Diarrhea, unspecified type -     Reticulin Antibody, IgA w titer -     Food Specific IgG Allergy( Adult)  Essential hypertension, benign   Symptoms and duration to sound like IBS-D. Does not seem like she has tried a lot of medication intervention. I think she would be a great candidate for xifaxin. Discussed medications and side effects. Coupon card given. Start back on probiotic. Discussed food diary to look for triggers. Celiac and IGG labs were ordered.   BP not great today. Has not taken diuretic yet today. BP at home in the 130's  over 70's. Continue on HCTZ today. Keeping checking BP and report back in 2 weeks.

## 2018-07-18 LAB — FOOD SPECIFIC IGG ALLERGY( ADULT)
CACAO (CHOCOLATE)IGG: 2.3 ug/mL — AB (ref <2.0)
CASEIN IGE: 8.1 ug/mL — AB (ref <2.0)
COFFEE (F221) IGG: <2 ug/mL (ref <2.0)
CORN IGG: 6.3 ug/mL — AB (ref <2.0)
Egg white, IgG: 7.8 ug/mL — ABNORMAL HIGH (ref <2.0)
PEANUT (F13) IGG: 5.4 ug/mL — AB (ref <2.0)
SOYBEAN IGG: 2.1 ug/mL — AB (ref <2.0)
Tomato IgG: 3.8 ug/mL — ABNORMAL HIGH (ref <2.0)
Wheat IgG: 18.6 ug/mL — ABNORMAL HIGH (ref <2.0)
YEAST (F45) IGG: 4.6 ug/mL — ABNORMAL HIGH (ref <2.0)

## 2018-07-18 LAB — RETICULIN ANTIBODIES, IGA W TITER: RETICULIN IGA SCREEN: NEGATIVE

## 2018-07-19 DIAGNOSIS — G4733 Obstructive sleep apnea (adult) (pediatric): Secondary | ICD-10-CM | POA: Diagnosis not present

## 2018-07-20 ENCOUNTER — Encounter: Payer: Self-pay | Admitting: Physician Assistant

## 2018-07-20 DIAGNOSIS — Z91018 Allergy to other foods: Secondary | ICD-10-CM | POA: Insufficient documentation

## 2018-07-20 HISTORY — DX: Allergy to other foods: Z91.018

## 2018-07-20 NOTE — Progress Notes (Signed)
Call pt: you had multiple IGG sensitivities but the highest was wheat!!! For the next 2 weeks certainly cut out wheat for the next 2 weeks. Next would be casein, corn ,peanuts. Give every elimination 2 weeks to see if there is any difference.

## 2018-07-31 DIAGNOSIS — G4733 Obstructive sleep apnea (adult) (pediatric): Secondary | ICD-10-CM | POA: Diagnosis not present

## 2018-08-19 DIAGNOSIS — G4733 Obstructive sleep apnea (adult) (pediatric): Secondary | ICD-10-CM | POA: Diagnosis not present

## 2018-08-25 ENCOUNTER — Other Ambulatory Visit: Payer: Self-pay | Admitting: Physician Assistant

## 2018-08-25 DIAGNOSIS — Z1231 Encounter for screening mammogram for malignant neoplasm of breast: Secondary | ICD-10-CM

## 2018-09-09 ENCOUNTER — Encounter: Payer: Self-pay | Admitting: Physician Assistant

## 2018-09-17 DIAGNOSIS — Z713 Dietary counseling and surveillance: Secondary | ICD-10-CM | POA: Diagnosis not present

## 2018-09-19 DIAGNOSIS — G4733 Obstructive sleep apnea (adult) (pediatric): Secondary | ICD-10-CM | POA: Diagnosis not present

## 2018-09-28 ENCOUNTER — Encounter: Payer: Self-pay | Admitting: Physician Assistant

## 2018-10-02 ENCOUNTER — Ambulatory Visit (INDEPENDENT_AMBULATORY_CARE_PROVIDER_SITE_OTHER): Payer: BLUE CROSS/BLUE SHIELD | Admitting: Physician Assistant

## 2018-10-02 VITALS — BP 116/72 | HR 82 | Temp 98.4°F | Wt 180.0 lb

## 2018-10-02 DIAGNOSIS — I10 Essential (primary) hypertension: Secondary | ICD-10-CM

## 2018-10-02 MED ORDER — HYDROCHLOROTHIAZIDE 12.5 MG PO TABS: 12.5000 mg | ORAL_TABLET | Freq: Every day | ORAL | 1 refills | Status: DC

## 2018-10-02 NOTE — Progress Notes (Signed)
Patient in today for BP check. BP at last visit was148/68. Patient reports she is taking HCTZ 12.5 only BP in the office today is116/72. Pt denies headaches, blurred vision, chest pain and shortness of breath. Pt is to continue with current medications, Provider will be notified . If any changes Pt will be called and advised.

## 2018-10-02 NOTE — Progress Notes (Signed)
BP looks great.  HCTZ refilled. Sent to mail order pharmacy.

## 2018-10-08 DIAGNOSIS — Z713 Dietary counseling and surveillance: Secondary | ICD-10-CM | POA: Diagnosis not present

## 2018-10-12 NOTE — Progress Notes (Signed)
Message from [place];Review Type:Prior Auth;Coverage Start Date:09/12/2018;Coverage End Date:10/12/2019.

## 2018-10-21 ENCOUNTER — Ambulatory Visit (INDEPENDENT_AMBULATORY_CARE_PROVIDER_SITE_OTHER): Payer: BLUE CROSS/BLUE SHIELD

## 2018-10-21 DIAGNOSIS — Z1231 Encounter for screening mammogram for malignant neoplasm of breast: Secondary | ICD-10-CM | POA: Diagnosis not present

## 2018-10-21 DIAGNOSIS — R928 Other abnormal and inconclusive findings on diagnostic imaging of breast: Secondary | ICD-10-CM

## 2018-10-23 ENCOUNTER — Other Ambulatory Visit: Payer: Self-pay | Admitting: Physician Assistant

## 2018-10-23 DIAGNOSIS — N6310 Unspecified lump in the right breast, unspecified quadrant: Secondary | ICD-10-CM

## 2018-10-23 NOTE — Progress Notes (Signed)
Call right breast shows possible mass and needs more imaging. They should be contacting you to schedule.

## 2018-10-27 ENCOUNTER — Other Ambulatory Visit: Payer: Self-pay | Admitting: Physician Assistant

## 2018-10-27 DIAGNOSIS — R928 Other abnormal and inconclusive findings on diagnostic imaging of breast: Secondary | ICD-10-CM

## 2018-10-28 ENCOUNTER — Other Ambulatory Visit: Payer: Self-pay | Admitting: Physician Assistant

## 2018-10-28 ENCOUNTER — Ambulatory Visit
Admission: RE | Admit: 2018-10-28 | Discharge: 2018-10-28 | Disposition: A | Discharge status: Home / Self Care | Payer: BLUE CROSS/BLUE SHIELD | Source: Ambulatory Visit | Attending: Physician Assistant | Admitting: Physician Assistant

## 2018-10-28 ENCOUNTER — Other Ambulatory Visit: Payer: Self-pay

## 2018-10-28 DIAGNOSIS — N6310 Unspecified lump in the right breast, unspecified quadrant: Secondary | ICD-10-CM

## 2018-10-28 DIAGNOSIS — Z170 Estrogen receptor positive status [ER+]: Secondary | ICD-10-CM | POA: Diagnosis not present

## 2018-10-28 DIAGNOSIS — R928 Other abnormal and inconclusive findings on diagnostic imaging of breast: Secondary | ICD-10-CM

## 2018-10-28 DIAGNOSIS — N6312 Unspecified lump in the right breast, upper inner quadrant: Secondary | ICD-10-CM | POA: Diagnosis not present

## 2018-10-28 DIAGNOSIS — R599 Enlarged lymph nodes, unspecified: Secondary | ICD-10-CM

## 2018-10-28 DIAGNOSIS — C50211 Malignant neoplasm of upper-inner quadrant of right female breast: Secondary | ICD-10-CM | POA: Diagnosis not present

## 2018-10-28 DIAGNOSIS — D4989 Neoplasm of unspecified behavior of other specified sites: Secondary | ICD-10-CM | POA: Diagnosis not present

## 2018-10-28 DIAGNOSIS — N6315 Unspecified lump in the right breast, overlapping quadrants: Secondary | ICD-10-CM | POA: Diagnosis not present

## 2018-10-28 DIAGNOSIS — R590 Localized enlarged lymph nodes: Secondary | ICD-10-CM | POA: Diagnosis not present

## 2018-10-29 ENCOUNTER — Other Ambulatory Visit: Payer: BLUE CROSS/BLUE SHIELD

## 2018-11-02 ENCOUNTER — Telehealth: Payer: Self-pay | Admitting: Oncology

## 2018-11-02 ENCOUNTER — Encounter: Payer: Self-pay | Admitting: *Deleted

## 2018-11-02 DIAGNOSIS — C50211 Malignant neoplasm of upper-inner quadrant of right female breast: Secondary | ICD-10-CM

## 2018-11-02 DIAGNOSIS — Z170 Estrogen receptor positive status [ER+]: Principal | ICD-10-CM

## 2018-11-02 HISTORY — DX: Estrogen receptor positive status (ER+): C50.211

## 2018-11-02 NOTE — Telephone Encounter (Signed)
Spoke to patient to confirm morning [facility] appointment for 3/18, packet emailed to patient

## 2018-11-02 NOTE — Progress Notes (Signed)
Pt called. She is doing well. No plan has been made at this time. Next appt 3/18.

## 2018-11-03 NOTE — Progress Notes (Signed)
[place]  [phone]) [phone] [phone]) [phone]    ID: Debbie [name] DOB: 1960/09/27  MR#: [id number]  XBL#:[id number]  Patient Care Team: Lavada Mesi as PCP - General (Family Medicine) Mauro Kaufmann, RN as Oncology Nurse Navigator Rockwell Germany, RN as Oncology Nurse Navigator Alphonsa Overall, MD as Consulting Physician (General Surgery) Tenna Lacko, Virgie Dad, MD as Consulting Physician (Oncology) Gery Pray, MD as Consulting Physician (Radiation Oncology) OTHER MD: Dr. Elouise Munroe (derma) Truhilio (thyroid)   CHIEF COMPLAINT: HER-2 positive breast cancer  CURRENT TREATMENT: Neoadjuvant chemotherapy   HISTORY OF CURRENT ILLNESS: Debbie [name] had routine screening mammography on 10/21/2018 showing a possible abnormality in the right breast. She felt a lump in her breast, possibly dating back to 01/2018, but she didn't think anything about it due to her history with fibrous breasts; she has had fibrous breasts all her life and began to have mammograms at the age of 36.   She underwent right breast ultrasonography at [facility] on 10[facility]28[facility]2018 showing: On physical exam, a firm, fixed mass is palpated in the superior right breast. Targeted ultrasound is performed, showing an irregular hypoechoic mass at the 1 o'clock position 4 cm from the nipple. It measures 2.8 x 2.4 x 1.8 cm. There is associated peripheral and internal vascularity. An oval, circumscribed hypoechoic mass is also identified within the vicinity at the 1 o'clock position 4 cm from the nipple. It measures 1.0 x 0.9 x 0.4 cm. This corresponds with an additional mammographically identified mass in the upper inner quadrant at middle depth. This is mammographically stable dating back to at least 2014. Additional stable, circumscribed masses are scattered throughout the bilateral breasts mammographically. Evaluation of the right axilla demonstrates a single, markedly abnormal lymph node  with cortical thickening up to 1 cm. No additional suspicious lymphadenopathy is identified.  Accordingly on 10/28/2018 she proceeded to biopsy of the right breast area in question. The pathology from this procedure showed ([id number]): invasive ductal carcinoma, nottingham grade II of III, 2.8 cm, 1 o'clock, 4.0 cm from the nipple. Prognostic indicators significant for: estrogen receptor, 90% positive, with strong staining intensity and progesterone receptor, 0% negative. Proliferation marker Ki67 at 20%. HER2 Positive (3+) by immunohistochemistry.   An additional biopsy of the right axilla was performed on the same day ([id number]) showing:  2. Lymph node, needle/core biopsy, inferior right axilla - Tumor cells within fragmented nodal tissue  The patient's subsequent history is as detailed below.   INTERVAL HISTORY: Debbie [name] was evaluated in the multidisciplinary breast cancer clinic on 11/04/2018 accompanied by her husband, Debbie [name] and her daughter, Debbie [name].   Her case was also presented at the limited multidisciplinary breast cancer conference on the same day. At that time a preliminary plan was proposed: Adjuvant chemotherapy, breast conserving surgery with targeted axillary dissection, adjuvant radiation, antiestrogens   REVIEW OF SYSTEMS: There were no specific symptoms leading to the original mammogram, which was routinely scheduled. Debbie [name] denies unusual headaches, visual changes, nausea, vomiting, stiff neck, dizziness, or gait imbalance. There has been no cough, phlegm production, or pleurisy, no chest pain or pressure, and no change in bowel or bladder habits. The patient denies fever, rash, bleeding, unexplained fatigue or unexplained weight loss. A detailed review of systems was otherwise entirely negative.   PAST MEDICAL HISTORY: Past Medical History:  Diagnosis Date   Anxiety    Asthma    with severe URIs   Bronchitis    Essential hypertension, benign 12/01/2012  Generalized anxiety disorder 12/01/2012   Hypertension    Seasonal allergies      PAST SURGICAL HISTORY: Past Surgical History:  Procedure Laterality Date   ABDOMINAL HYSTERECTOMY  2011   ANKLE SURGERY     BREAST CYST EXCISION Bilateral    BREAST EXCISIONAL BIOPSY Bilateral    CHOLECYSTECTOMY  2012   KNEE ARTHROSCOPY W/ MEDIAL COLLATERAL LIGAMENT (MCL) REPAIR Left 10-07-2012   TONSILLECTOMY  1969     FAMILY HISTORY: Family History  Problem Relation Age of Onset   Hypertension Mother    Rheum arthritis Mother    Hypertension [name]    Alcoholism [name]    Colon cancer [name]    Lung cancer [name]    Depression [name]    Diabetes [name]    Skin cancer [name]    Hypertension Brother    Stroke Brother    Atrial fibrillation Brother    Hyperlipidemia Maternal Grandfather    Stroke Paternal 29    Healthy Daughter    Healthy Daughter    Debbie [name]'s [name] died from lung cancer at age 11. Patients' mother died from congestive heart failure at age 47. The patient has 1 brother. Patient denies anyone in her family having breast, ovarian, prostate, or pancreatic cancer. Debbie [name] was diagnosed with skin cancer, colon cancer, and lung cancer. Debbie [name] was diagnosed with skin cancer.    GYNECOLOGIC HISTORY:  No LMP recorded. Patient has had a hysterectomy. 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 Her husband, Debbie [name], is in [employer]	[place]. They have two daughters, Debbie [name] and Debbie [name]. [address] Dawna Part is 98, lives in [place], [place], and works in Building surveyor. Adrian Blackwater [place] is 43, lives in [place], [place], and is currently in school; he currently works part time as a Runner, broadcasting/film/video.   ADVANCED DIRECTIVES: In the absence of any documentation, Debbie [name]'s spouse, Debbie [name], is her healthcare power of attorney.      HEALTH MAINTENANCE: Social History   Tobacco Use   Smoking status: Never Smoker   Smokeless tobacco: Never Used  Substance Use Topics   Alcohol use: Yes    Comment: 2 q mth   Drug use: No    Colonoscopy: yes, 2015, [facility]   PAP: 2011  Bone density: no Mammography: 10/21/2018  Allergies  Allergen Reactions   Azithromycin    Erythromycin Itching   Levofloxacin     Tendon pain   Penicillins Swelling and Rash    Current Outpatient Medications  Medication Sig Dispense Refill   AMBULATORY NON FORMULARY MEDICATION Continuous positive airway pressure (CPAP) machine set at autopap, with all supplemental supplies as needed. 1 each 0   hydrochlorothiazide (HYDRODIURIL) 12.5 MG tablet Take 1 tablet (12.5 mg total) by mouth daily. 90 tablet 1   dexamethasone (DECADRON) 4 MG tablet Take 2 tablets (8 mg total) by mouth 2 (two) times daily. Start the day before Taxotere. Take once the day after, then 2 times a day x 2d. (Patient not taking: Reported on 11/04/2018) 30 tablet 1   lidocaine-prilocaine (EMLA) cream Apply to affected area once (Patient not taking: Reported on 11/04/2018) 30 g 3   Liraglutide -Weight Management (SAXENDA) 18 MG/3ML SOPN Inject 3 mg into the skin daily. Please include ultra fine needles 69m  15 pen 1   LORazepam (ATIVAN) 0.5 MG tablet Take 1 tablet (0.5 mg total) by mouth every 6 (six) hours as needed (Nausea or vomiting). (Patient not taking: Reported on 11/04/2018) 30 tablet 0   pantoprazole (PROTONIX) 40 MG tablet TAKE 1 TABLET DAILY (Patient not taking: Reported on 11/04/2018) 90 tablet 4   PROAIR HFA 108 (90 Base) MCG/ACT inhaler USE 2 INHALATIONS EVERY 4 HOURS AS NEEDED FOR WHEEZING (Patient not taking: Reported on 11/04/2018) 25.5 g 1   prochlorperazine (COMPAZINE) 10 MG tablet Take 1 tablet (10  mg total) by mouth every 6 (six) hours as needed (Nausea or vomiting). (Patient not taking: Reported on 11/04/2018) 30 tablet 1   rifaximin (XIFAXAN) 550 MG TABS tablet Take 1 tablet (550 mg total) by mouth 3 (three) times daily. For 14 days. (Patient not taking: Reported on 11/04/2018) 42 tablet 0   valACYclovir (VALTREX) 1000 MG tablet TAKE 2 TABLETS EVERY 12 HOURS FOR ONE DAY, TAKE AT THE ONSET OF COLD SORE (Patient not taking: Reported on 11/04/2018) 21 tablet 1   No current facility-administered medications for this visit.      OBJECTIVE: Middle-aged white woman in no acute distress  Vitals:   11/04/18 0916  BP: (!) 138/92  Pulse: 68  Resp: 18  Temp: 98.6 F (37 C)  SpO2: 100%     Body mass index is 29.91 kg/m.   Wt Readings from Last 3 Encounters:  11/04/18 185 lb 4.8 oz (84.1 kg)  10/02/18 180 lb (81.6 kg)  07/13/18 186 lb (84.4 kg)      ECOG FS:0 - Asymptomatic  Ocular: Sclerae unicteric, pupils round and equal Ear-nose-throat: Oropharynx clear and moist Lymphatic: No cervical or supraclavicular adenopathy Lungs no rales or rhonchi Heart regular rate and rhythm Abd soft, nontender, positive bowel sounds MSK no focal spinal tenderness, no joint edema Neuro: non-focal, well-oriented, appropriate affect Breasts: The right breast mass is easily palpable in the inferior slightly outer aspect of the breast, it is movable, not associated with erythema or skin changes or with nipple retraction.  The left breast is benign.  I do not palpate any axillary masses in the right axilla.  The left axilla is benign   LAB RESULTS:  CMP     Component Value Date/Time   NA 144 11/04/2018 0835   K 3.7 11/04/2018 0835   CL 107 11/04/2018 0835   CO2 28 11/04/2018 0835   GLUCOSE 96 11/04/2018 0835   BUN 18 11/04/2018 0835   CREATININE 0.79 11/04/2018 0835   CREATININE 0.66 07/08/2017 1127   CALCIUM 9.3 11/04/2018 0835   PROT 6.6 11/04/2018 0835   ALBUMIN 4.0 11/04/2018 0835   AST  19 11/04/2018 0835   ALT 19 11/04/2018 0835   ALKPHOS 99 11/04/2018 0835   BILITOT 0.8 11/04/2018 0835   GFRNONAA >60 11/04/2018 0835   GFRNONAA 98 07/08/2017 1127   GFRAA >60 11/04/2018 0835   GFRAA 114 07/08/2017 1127    No results found for: TOTALPROTELP, ALBUMINELP, A1GS, A2GS, BETS, BETA2SER, GAMS, MSPIKE, SPEI  No results found for: KPAFRELGTCHN, LAMBDASER, KAPLAMBRATIO  Lab Results  Component Value Date   WBC 5.9 11/04/2018   NEUTROABS 3.5 11/04/2018   HGB 13.9 11/04/2018   HCT 41.2 11/04/2018   MCV 85.7 11/04/2018   PLT 283 11/04/2018    _0 @  No results found for: LABCA2  No components found for: [id number]  No results for input(s): INR in the last 168 hours.  No results found for:  LABCA2  No results found for: IHK742  No results found for: VZD638  No results found for: VFI433  No results found for: CA2729  No components found for: HGQUANT  No results found for: CEA1 / No results found for: CEA1   No results found for: AFPTUMOR  No results found for: CHROMOGRNA  No results found for: PSA1  Appointment on 11/04/2018  Component Date Value Ref Range Status   Sodium 11/04/2018 144  135 - 145 mmol/L Final   Potassium 11/04/2018 3.7  3.5 - 5.1 mmol/L Final   Chloride 11/04/2018 107  98 - 111 mmol/L Final   CO2 11/04/2018 28  22 - 32 mmol/L Final   Glucose, Bld 11/04/2018 96  70 - 99 mg/dL Final   BUN 11/04/2018 18  6 - 20 mg/dL Final   Creatinine 11/04/2018 0.79  0.44 - 1.00 mg/dL Final   Calcium 11/04/2018 9.3  8.9 - 10.3 mg/dL Final   Total Protein 11/04/2018 6.6  6.5 - 8.1 g/dL Final   Albumin 11/04/2018 4.0  3.5 - 5.0 g/dL Final   AST 11/04/2018 19  15 - 41 U/L Final   ALT 11/04/2018 19  0 - 44 U/L Final   Alkaline Phosphatase 11/04/2018 99  38 - 126 U/L Final   Total Bilirubin 11/04/2018 0.8  0.3 - 1.2 mg/dL Final   GFR, Est Non Af Am 11/04/2018 >60  >60 mL/min Final   GFR, Est AFR Am 11/04/2018 >60  >60 mL/min  Final   Anion gap 11/04/2018 9  5 - 15 Final   Performed at [facility], [place] [address] [place], [place] [address]   WBC Count 11/04/2018 5.9  4.0 - 10.5 K/uL Final   RBC 11/04/2018 4.81  3.87 - 5.11 MIL/uL Final   Hemoglobin 11/04/2018 13.9  12.0 - 15.0 g/dL Final   HCT 11/04/2018 41.2  36.0 - 46.0 % Final   MCV 11/04/2018 85.7  80.0 - 100.0 fL Final   MCH 11/04/2018 28.9  26.0 - 34.0 pg Final   MCHC 11/04/2018 33.7  30.0 - 36.0 g/dL Final   RDW 11/04/2018 12.4  11.5 - 15.5 % Final   Platelet Count 11/04/2018 283  150 - 400 K/uL Final   nRBC 11/04/2018 0.0  0.0 - 0.2 % Final   Neutrophils Relative % 11/04/2018 59  % Final   Neutro Abs 11/04/2018 3.5  1.7 - 7.7 K/uL Final   Lymphocytes Relative 11/04/2018 23  % Final   Lymphs Abs 11/04/2018 1.4  0.7 - 4.0 K/uL Final   Monocytes Relative 11/04/2018 10  % Final   Monocytes Absolute 11/04/2018 0.6  0.1 - 1.0 K/uL Final   Eosinophils Relative 11/04/2018 7  % Final   Eosinophils Absolute 11/04/2018 0.4  0.0 - 0.5 K/uL Final   Basophils Relative 11/04/2018 1  % Final   Basophils Absolute 11/04/2018 0.0  0.0 - 0.1 K/uL Final   Immature Granulocytes 11/04/2018 0  % Final   Abs Immature Granulocytes 11/04/2018 0.01  0.00 - 0.07 K/uL Final   Performed at [facility], [place] [address] [place], [place] [address]    (this displays the last labs from the last 3 days)  No results found for: TOTALPROTELP, ALBUMINELP, A1GS, A2GS, BETS, BETA2SER, GAMS, MSPIKE, SPEI (this displays SPEP labs)  No results found for: KPAFRELGTCHN, LAMBDASER, KAPLAMBRATIO (kappa/lambda light chains)  No results found for: HGBA, HGBA2QUANT, HGBFQUANT, HGBSQUAN (Hemoglobinopathy evaluation)   No results found for: LDH  Lab Results  Component Value Date   IRON 46 05/11/2013   TIBC 254 05/11/2013   IRONPCTSAT 18 (L) 05/11/2013   (Iron and TIBC)  Lab Results  Component Value Date    FERRITIN 72 05/11/2013    Urinalysis No results found for: COLORURINE, APPEARANCEUR, LABSPEC, PHURINE, GLUCOSEU, HGBUR, BILIRUBINUR, KETONESUR, PROTEINUR, UROBILINOGEN, NITRITE, LEUKOCYTESUR   STUDIES:  US Breast Ltd Uni Right Inc Axilla  Result Date: 10/28/2018 CLINICAL DATA:  58 year old female recalled from screening mammogram dated 10/21/2018 for a right breast mass. The patient states she has developed a palpable lump in the superior right breast over the last 1-2 weeks. EXAM: ULTRASOUND OF THE RIGHT BREAST COMPARISON:  Previous exam(s). FINDINGS: On physical exam, I palpate a firm, fixed mass in the superior right breast. Targeted ultrasound is performed, showing an irregular hypoechoic mass at the 1 o'clock position 4 cm from the nipple. It measures 2.8 x 2.4 x 1.8 cm. There is associated peripheral and internal vascularity. An oval, circumscribed hypoechoic mass is also identified within the vicinity at the 1 o'clock position 4 cm from the nipple. It measures 10 x 9 x 4 mm. This corresponds with an additional mammographically identified mass in the upper inner quadrant at middle depth. This is mammographically stable dating back to at least 2014. Additional stable, circumscribed masses are scattered throughout the bilateral breasts mammographically. Evaluation of the right axilla demonstrates a single, markedly abnormal lymph node with cortical thickening up to 1 cm. No additional suspicious lymphadenopathy is identified. IMPRESSION: 1. Highly suspicious right breast mass corresponding with the screening mammographic findings and the patient's palpable lump. Recommendation is for ultrasound-guided biopsy. 2. A single suspicious right axillary lymph node. Recommendation is for ultrasound-guided biopsy. 3. Additional benign circumscribed mass in the vicinity of the index lesion. This corresponds with a mammographically stable mass dating back to at least 2014. RECOMMENDATION: 1. Two area  ultrasound-guided biopsy of the right breast and right axilla. 2. Given the patient's breast density, bilateral contrast-enhanced breast MRI is recommended pending biopsy results. I have discussed the findings and recommendations with the patient. Results were also provided in writing at the conclusion of the visit. If applicable, a reminder letter will be sent to the patient regarding the next appointment. BI-RADS CATEGORY  5: Highly suggestive of malignancy. Electronically Signed   By: Kristopher Oppenheim M.D.   On: 10/28/2018 13:11   Mm 3d Screen Breast Bilateral  Result Date: 10/23/2018 CLINICAL DATA:  Screening. EXAM: DIGITAL SCREENING BILATERAL MAMMOGRAM WITH TOMO AND CAD COMPARISON:  Previous exam(s). ACR Breast Density Category b: There are scattered areas of fibroglandular density. FINDINGS: In the right breast, a possible mass warrants further evaluation. In the left breast, no findings suspicious for malignancy. Images were processed with CAD. IMPRESSION: Further evaluation is suggested for possible mass in the right breast. RECOMMENDATION: Ultrasound of the right breast. (Code:[id number]) The patient will be contacted regarding the findings, and additional imaging will be scheduled. BI-RADS CATEGORY  0: Incomplete. Need additional imaging evaluation and/or prior mammograms for comparison. Electronically Signed   By: Abelardo Diesel M.D.   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 TUMOR CELLS WITHIN FRAGMENTED NODAL TISSUE of the Right axilla. This was found to be concordant by Dr. Curlene Dolphin. Pathology results were discussed with the patient by telephone. The  patient reported doing well after the biopsies with tenderness at the sites. Post biopsy instructions and care were reviewed and questions were answered. The patient  was encouraged to call [facility] for any additional concerns. The patient was referred to [facility] on November 04, 2018. Given the patient's breast density, bilateral contrast-enhanced breast MRI is recommended. Pathology results reported by Terie Purser, RN on 10/30/2018. Electronically Signed   By: Curlene Dolphin M.D.   On: 10/30/2018 12:08   Result Date: 10/30/2018 CLINICAL DATA:  Ultrasound-guided core needle biopsy was recommended of a suspicious right axillary lymph node. The patient is also having a suspicious right breast mass biopsied today. EXAM: [place] AXILLARY NODE CORE BIOPSY RIGHT COMPARISON:  Previous exam(s). FINDINGS: I met with the patient and we discussed the procedure of ultrasound-guided biopsy, including benefits and alternatives. We discussed the high likelihood of a successful procedure. We discussed the risks of the procedure, including infection, bleeding, tissue injury, clip migration, and inadequate sampling. Informed written consent was given. The usual time-out protocol was performed immediately prior to the procedure. Using sterile technique and 1% Lidocaine as local anesthetic, under direct ultrasound visualization, a 14 gauge spring-loaded device was used to perform biopsy of a right axillary lymph node using a lateral approach. At the conclusion of the procedure a HydroMARK tissue marker clip was deployed into the biopsy cavity. Follow up 2 view mammogram was performed and dictated separately. IMPRESSION: Ultrasound guided biopsy of a right axillary lymph node. No apparent complications. Electronically Signed: By: Curlene Dolphin M.D. On: 10/28/2018 16:01   Mm Clip Placement Right  Result Date: 10/28/2018 CLINICAL DATA:  Ultrasound-guided biopsies of a right breast mass and of a suspicious right axillary lymph node performed today. EXAM: DIAGNOSTIC RIGHT MAMMOGRAM POST ULTRASOUND  BIOPSIES COMPARISON:  Previous exam(s). FINDINGS: Mammographic images were obtained following ULTRASOUND guided biopsy of a right breast mass at 1 o'clock position. A ribbon shaped biopsy clip is satisfactorily positioned along the inferomedial aspect of the mass. A HydroMARK biopsy clip is satisfactorily positioned within an enlarged lymph node in the right axilla. IMPRESSION: Satisfactory position of ribbon shaped biopsy clip in the inferomedial portion of the right breast mass. Satisfactory position of HydroMARK biopsy clip in the right axillary lymph node. Final Assessment: Post Procedure Mammograms for Marker Placement Electronically Signed   By: Curlene Dolphin M.D.   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 TUMOR CELLS WITHIN FRAGMENTED NODAL TISSUE of the Right axilla. This was found to be concordant by Dr. Curlene Dolphin. Pathology results were discussed with the patient by telephone. The patient reported doing well after the biopsies with tenderness at the sites. Post biopsy instructions and care were reviewed and questions were answered. The patient was encouraged to call [facility] for any additional concerns. The patient was referred to [facility] on November 04, 2018. Given the patient's breast density, bilateral contrast-enhanced breast MRI is recommended. Pathology results reported by Terie Purser, RN on 10/30/2018. Electronically Signed   By: Curlene Dolphin M.D.   On: 10/30/2018 12:09   Result Date: 10/30/2018 CLINICAL DATA:  Ultrasound-guided  core needle biopsy was recommended of a suspicious palpable mass in the upper inner right breast. EXAM: ULTRASOUND GUIDED RIGHT BREAST CORE NEEDLE BIOPSY  COMPARISON:  Previous exam(s). FINDINGS: I met with the patient and we discussed the procedure of ultrasound-guided biopsy, including benefits and alternatives. We discussed the high likelihood of a successful procedure. We discussed the risks of the procedure, including infection, bleeding, tissue injury, clip migration, and inadequate sampling. Informed written consent was given. The usual time-out protocol was performed immediately prior to the procedure. Lesion quadrant: Upper inner quadrant Using sterile technique and 1% Lidocaine as local anesthetic, under direct ultrasound visualization, a 12 gauge spring-loaded device was used to perform biopsy of a palpable hypoechoic mass 1 o'clock position using a lateral approach. At the conclusion of the procedure a ribbon tissue marker clip was deployed into the biopsy cavity. Follow up 2 view mammogram was performed and dictated separately. IMPRESSION: Ultrasound guided biopsy of the right breast. No apparent complications. Electronically Signed: By: Curlene Dolphin M.D. On: 10/28/2018 16:00     ELIGIBLE FOR AVAILABLE RESEARCH PROTOCOL:    ASSESSMENT: 58 y.o. [place], [place] woman status post right breast upper inner quadrant biopsy 10/28/2018 for a clinical T2 N1, stage IIA invasive ductal carcinoma, grade 2, estrogen receptor positive, progesterone receptor negative, HER-2 amplified, with an MIB-1 of 20%  (1) neoadjuvant chemotherapy will consist of carboplatin, docetaxel, trastuzumab, and Pertuzumab every 21 days x 6  (2) trastuzumab will be continued to complete 1 year  (3) definitive surgery to follow  (4) adjuvant radiation as appropriate  (5) antiestrogens at the completion of local treatment   PLAN: I spent approximately 60 minutes face to face with Aryanah with more than 50% of that time spent in counseling and coordination of care. Specifically we reviewed the biology of the patient's diagnosis and the specifics of her situation. We first  reviewed the fact that cancer is not one disease but more than 100 different diseases and that it is important to keep them separate-- otherwise when friends and relatives discuss their own cancer experiences with Camellia confusion can result. Similarly we explained that if breast cancer spreads to the bone or liver, the patient would not have bone cancer or liver cancer, but breast cancer in the bone and breast cancer in the liver: one cancer in three places-- not 3 different cancers which otherwise would have to be treated in 3 different ways.  We discussed the difference between local and systemic therapy. In terms of loco-regional treatment, lumpectomy plus radiation is equivalent to mastectomy as far as survival is concerned. For this reason, and because the cosmetic results are generally superior, we recommend breast conserving surgery.   We also noted that in terms of sequencing of treatments, whether systemic therapy or surgery is done first does not affect the ultimate outcome.  This is relevant to Debbie situation since we believe she will benefit from neoadjuvant chemotherapy: This will allow [place] to assess tumor response and also hopefully help her avoid a full axillary lymph node dissection.  We then discussed the rationale for systemic therapy. There is some risk that this cancer may have already spread to other parts of her body. Patients frequently ask at this point about bone scans, CAT scans and PET scans to find out if they have occult breast cancer somewhere else. Scans cannot answer the question the patient really would like to know, which is whether she has microscopic disease elsewhere in her body.  However even her  node positive disease we are going to obtain a baseline CT scan of the chest.  Next we went over the options for systemic therapy which are anti-estrogens, anti-HER-2 immunotherapy, and chemotherapy. Dexter meets criteria for all 3.  This is very favorable and we went into  the "algebra" of risk reduction with the several agents.  We also discussed standard of care in this setting, which currently includes carboplatin, docetaxel, trastuzumab and Pertuzumab.  We discussed the toxicity side effects and complications of these agents and she will also meet with our chemotherapy teaching nurse for further instruction.  She will need an echocardiogram, port placement, and because of concerns regarding her left thyroid mass, she is also being scheduled for a thyroid ultrasound with biopsy and a CT scan of the chest  Debbie [name] has a good understanding of the overall plan. She agrees with it. She knows the goal of treatment in her case is cure. She will call with any problems that may develop before her next visit here.    Nigil Braman, Virgie Dad, MD  11/04/18 4:26 PM Medical Oncology and Hematology [facility] [address] [place], [place] [address] Tel. [phone]    Fax. [phone]   I, Jacqualyn Posey am acting as a Education administrator for Chauncey Cruel, MD.   I, Lurline Del MD, have reviewed the above documentation for accuracy and completeness, and I agree with the above.

## 2018-11-04 ENCOUNTER — Other Ambulatory Visit: Payer: Self-pay | Admitting: *Deleted

## 2018-11-04 ENCOUNTER — Inpatient Hospital Stay: Payer: BLUE CROSS/BLUE SHIELD | Attending: Oncology | Admitting: Oncology

## 2018-11-04 ENCOUNTER — Ambulatory Visit: Payer: BLUE CROSS/BLUE SHIELD | Admitting: Physical Therapy

## 2018-11-04 ENCOUNTER — Other Ambulatory Visit: Payer: Self-pay | Admitting: Surgery

## 2018-11-04 ENCOUNTER — Encounter: Payer: Self-pay | Admitting: Physical Therapy

## 2018-11-04 ENCOUNTER — Inpatient Hospital Stay: Payer: BLUE CROSS/BLUE SHIELD

## 2018-11-04 ENCOUNTER — Other Ambulatory Visit: Payer: Self-pay

## 2018-11-04 ENCOUNTER — Telehealth: Payer: Self-pay | Admitting: Oncology

## 2018-11-04 ENCOUNTER — Encounter: Payer: Self-pay | Admitting: Oncology

## 2018-11-04 ENCOUNTER — Ambulatory Visit
Admission: RE | Admit: 2018-11-04 | Discharge: 2018-11-04 | Disposition: A | Discharge status: Home / Self Care | Payer: BLUE CROSS/BLUE SHIELD | Source: Ambulatory Visit | Attending: Radiation Oncology | Admitting: Radiation Oncology

## 2018-11-04 VITALS — BP 138/92 | HR 68 | Temp 98.6°F | Resp 18 | Ht 66.0 in | Wt 185.3 lb

## 2018-11-04 DIAGNOSIS — Z5112 Encounter for antineoplastic immunotherapy: Secondary | ICD-10-CM | POA: Diagnosis not present

## 2018-11-04 DIAGNOSIS — C50211 Malignant neoplasm of upper-inner quadrant of right female breast: Secondary | ICD-10-CM

## 2018-11-04 DIAGNOSIS — Z5111 Encounter for antineoplastic chemotherapy: Secondary | ICD-10-CM | POA: Diagnosis not present

## 2018-11-04 DIAGNOSIS — C773 Secondary and unspecified malignant neoplasm of axilla and upper limb lymph nodes: Secondary | ICD-10-CM | POA: Diagnosis not present

## 2018-11-04 DIAGNOSIS — Z170 Estrogen receptor positive status [ER+]: Principal | ICD-10-CM

## 2018-11-04 DIAGNOSIS — Z79899 Other long term (current) drug therapy: Secondary | ICD-10-CM | POA: Diagnosis not present

## 2018-11-04 DIAGNOSIS — I10 Essential (primary) hypertension: Secondary | ICD-10-CM | POA: Diagnosis not present

## 2018-11-04 DIAGNOSIS — E042 Nontoxic multinodular goiter: Secondary | ICD-10-CM | POA: Diagnosis not present

## 2018-11-04 DIAGNOSIS — E041 Nontoxic single thyroid nodule: Secondary | ICD-10-CM | POA: Diagnosis not present

## 2018-11-04 DIAGNOSIS — Z808 Family history of malignant neoplasm of other organs or systems: Secondary | ICD-10-CM | POA: Diagnosis not present

## 2018-11-04 DIAGNOSIS — R293 Abnormal posture: Secondary | ICD-10-CM

## 2018-11-04 DIAGNOSIS — C50911 Malignant neoplasm of unspecified site of right female breast: Secondary | ICD-10-CM | POA: Diagnosis not present

## 2018-11-04 LAB — CMP (CANCER CENTER ONLY)
ALT: 19 U/L (ref 0–44)
AST: 19 U/L (ref 15–41)
Albumin: 4 g/dL (ref 3.5–5.0)
Alkaline Phosphatase: 99 U/L (ref 38–126)
Anion gap: 9 (ref 5–15)
BUN: 18 mg/dL (ref 6–20)
CO2: 28 mmol/L (ref 22–32)
Calcium: 9.3 mg/dL (ref 8.9–10.3)
Chloride: 107 mmol/L (ref 98–111)
Creatinine: 0.79 mg/dL (ref 0.44–1.00)
GFR, Est AFR Am: >60 mL/min (ref >60)
Glucose, Bld: 96 mg/dL (ref 70–99)
Potassium: 3.7 mmol/L (ref 3.5–5.1)
Sodium: 144 mmol/L (ref 135–145)
Total Bilirubin: 0.8 mg/dL (ref 0.3–1.2)
Total Protein: 6.6 g/dL (ref 6.5–8.1)

## 2018-11-04 LAB — CBC WITH DIFFERENTIAL (CANCER CENTER ONLY)
Abs Immature Granulocytes: 0.01 10*3/uL (ref 0.00–0.07)
BASOS ABS: 0 10*3/uL (ref 0.0–0.1)
Basophils Relative: 1 %
Eosinophils Absolute: 0.4 10*3/uL (ref 0.0–0.5)
Eosinophils Relative: 7 %
HCT: 41.2 % (ref 36.0–46.0)
Hemoglobin: 13.9 g/dL (ref 12.0–15.0)
Immature Granulocytes: 0 %
Lymphocytes Relative: 23 %
Lymphs Abs: 1.4 10*3/uL (ref 0.7–4.0)
MCH: 28.9 pg (ref 26.0–34.0)
MCHC: 33.7 g/dL (ref 30.0–36.0)
MCV: 85.7 fL (ref 80.0–100.0)
Monocytes Absolute: 0.6 10*3/uL (ref 0.1–1.0)
Monocytes Relative: 10 %
NRBC: 0 % (ref 0.0–0.2)
Neutro Abs: 3.5 10*3/uL (ref 1.7–7.7)
Neutrophils Relative %: 59 %
Platelet Count: 283 10*3/uL (ref 150–400)
RBC: 4.81 MIL/uL (ref 3.87–5.11)
RDW: 12.4 % (ref 11.5–15.5)
WBC: 5.9 10*3/uL (ref 4.0–10.5)

## 2018-11-04 MED ORDER — DEXAMETHASONE 4 MG PO TABS: 8.0000 mg | ORAL_TABLET | Freq: Two times a day (BID) | ORAL | 1 refills | Status: DC

## 2018-11-04 MED ORDER — LORAZEPAM 0.5 MG PO TABS: 0.5000 mg | ORAL_TABLET | Freq: Four times a day (QID) | ORAL | 0 refills | Status: DC | PRN

## 2018-11-04 MED ORDER — PROCHLORPERAZINE MALEATE 10 MG PO TABS: 10.0000 mg | ORAL_TABLET | Freq: Four times a day (QID) | ORAL | 1 refills | Status: DC | PRN

## 2018-11-04 MED ORDER — LIDOCAINE-PRILOCAINE 2.5-2.5 % EX CREA: TOPICAL_CREAM | CUTANEOUS | 3 refills | Status: DC

## 2018-11-04 NOTE — Progress Notes (Signed)
START ON PATHWAY REGIMEN - Breast     A cycle is every 21 days:     Pertuzumab      Pertuzumab      Trastuzumab-xxxx      Trastuzumab-xxxx      Carboplatin      Docetaxel   **Always confirm dose/schedule in your pharmacy ordering system**  Patient Characteristics: Preoperative or Nonsurgical Candidate (Clinical Staging), Neoadjuvant Therapy followed by Surgery, Invasive Disease, Chemotherapy, HER2 Positive, ER Positive Therapeutic Status: Preoperative or Nonsurgical Candidate (Clinical Staging) AJCC M Category: cM0 AJCC Grade: G2 Breast Surgical Plan: Neoadjuvant Therapy followed by Surgery ER Status: Positive (+) AJCC 8 Stage Grouping: IIA HER2 Status: Positive (+) AJCC T Category: cT2 AJCC N Category: cN1 PR Status: Negative (-) Intent of Therapy: Curative Intent, Discussed with Patient 

## 2018-11-04 NOTE — Telephone Encounter (Signed)
Scheduled appt per 3/18 sch message - pt is aware of date and time

## 2018-11-04 NOTE — Patient Instructions (Signed)

## 2018-11-04 NOTE — Progress Notes (Signed)
Radiation Oncology         [phone] ________________________________  Multidisciplinary Breast Oncology Clinic [facility]) Initial Outpatient Consultation  Name: Debbie [name] MRN: [id number]  Date: 11/04/2018  DOB: [birth year]  WI:[id number], Tedd Sias, MD   REFERRING PHYSICIAN: Alphonsa Overall, MD  DIAGNOSIS: The encounter diagnosis was Malignant neoplasm of upper-inner quadrant of right breast in female, estrogen receptor positive ([place]).       ICD-10-CM   1. Malignant neoplasm of upper-inner quadrant of right breast in female, estrogen receptor positive ([place]) C50.211    Z17.0     HISTORY OF PRESENT ILLNESS::Debbie [name] is a 58 y.o. female who is presenting to the office today for evaluation of her newly diagnosed breast cancer. She is accompanied by her husband and daughter. She is doing well overall.   She had routine screening mammography on 10/21/18 showing a possible abnormality in the right breast. She underwent bilateral diagnostic mammography with tomography and right breast ultrasonography at [facility] on 10/28/18 showing: a highly suspicious right breast mass corresponding with the screening mammographic findings and the patient's palpable lump. Recommendation is for ultrasound-guided biopsy. A single suspicious right axillary lymph node. Recommendation is for ultrasound-guided biopsy. Additional benign circumscribed mass in the vicinity of the index lesion. This corresponds with a mammographically stable mass dating back to at least 2014.  Biopsy on 10/28/18 showed: invasive ductal carcinoma in Breast, right, needle core biopsy, 1 o'clock, 4 cm fn. A lymph node, needle/core biopsy, inferior right axilla was biopsied and showed tumor cells within fragmented nodal tissue. Prognostic indicators significant for: estrogen receptor, 90% positive with moderate-weak staining intensity and progesterone receptor, 0% negative. Proliferation marker Ki67 at 20%.  HER2 positive.  Menarche: 58 years old Age at first live birth: 58 years old GP: GxP2 LMP: 03/01/2010 (hysterectomy) Contraceptive: [id number] HRT: No   The patient was referred today for presentation in the multidisciplinary conference.  Radiology studies and pathology slides were presented there for review and discussion of treatment options.  A consensus was discussed regarding potential next steps.  PREVIOUS RADIATION THERAPY: No  PAST MEDICAL HISTORY:  has a past medical history of Anxiety, Asthma, Bronchitis, Essential hypertension, benign (12/01/2012), Generalized anxiety disorder (12/01/2012), Hypertension, and Seasonal allergies.    PAST SURGICAL HISTORY: Past Surgical History:  Procedure Laterality Date   ABDOMINAL HYSTERECTOMY  2011   ANKLE SURGERY     BREAST CYST EXCISION Bilateral    BREAST EXCISIONAL BIOPSY Bilateral    CHOLECYSTECTOMY  2012   KNEE ARTHROSCOPY W/ MEDIAL COLLATERAL LIGAMENT (MCL) REPAIR Left 10-07-2012   TONSILLECTOMY  1969    FAMILY HISTORY: family history includes Alcoholism in her father; Atrial fibrillation in her brother; Colon cancer in her father; Depression in her father; Diabetes in her father; Healthy in her daughter and daughter; Hyperlipidemia in her maternal grandfather; Hypertension in her brother, father, and mother; Lung cancer in her father; Rheum arthritis in her mother; Skin cancer in her father; Stroke in her brother and paternal grandmother.  SOCIAL HISTORY:  reports that she has never smoked. She has never used smokeless tobacco. She reports current alcohol use. She reports that she does not use drugs.  ALLERGIES: Azithromycin; Erythromycin; Levofloxacin; and Penicillins  MEDICATIONS:  Current Outpatient Medications  Medication Sig Dispense Refill   AMBULATORY NON FORMULARY MEDICATION Continuous positive airway pressure (CPAP) machine set at autopap, with all supplemental supplies as needed. 1 each 0   dexamethasone  (DECADRON) 4 MG tablet Take  2 tablets (8 mg total) by mouth 2 (two) times daily. Start the day before Taxotere. Take once the day after, then 2 times a day x 2d. (Patient not taking: Reported on 11/04/2018) 30 tablet 1   hydrochlorothiazide (HYDRODIURIL) 12.5 MG tablet Take 1 tablet (12.5 mg total) by mouth daily. 90 tablet 1   lidocaine-prilocaine (EMLA) cream Apply to affected area once (Patient not taking: Reported on 11/04/2018) 30 g 3   Liraglutide -Weight Management (SAXENDA) 18 MG/3ML SOPN Inject 3 mg into the skin daily. Please include ultra fine needles 59m 15 pen 1   LORazepam (ATIVAN) 0.5 MG tablet Take 1 tablet (0.5 mg total) by mouth every 6 (six) hours as needed (Nausea or vomiting). (Patient not taking: Reported on 11/04/2018) 30 tablet 0   pantoprazole (PROTONIX) 40 MG tablet TAKE 1 TABLET DAILY (Patient not taking: Reported on 11/04/2018) 90 tablet 4   PROAIR HFA 108 (90 Base) MCG/ACT inhaler USE 2 INHALATIONS EVERY 4 HOURS AS NEEDED FOR WHEEZING (Patient not taking: Reported on 11/04/2018) 25.5 g 1   prochlorperazine (COMPAZINE) 10 MG tablet Take 1 tablet (10 mg total) by mouth every 6 (six) hours as needed (Nausea or vomiting). (Patient not taking: Reported on 11/04/2018) 30 tablet 1   rifaximin (XIFAXAN) 550 MG TABS tablet Take 1 tablet (550 mg total) by mouth 3 (three) times daily. For 14 days. (Patient not taking: Reported on 11/04/2018) 42 tablet 0   valACYclovir (VALTREX) 1000 MG tablet TAKE 2 TABLETS EVERY 12 HOURS FOR ONE DAY, TAKE AT THE ONSET OF COLD SORE (Patient not taking: Reported on 11/04/2018) 21 tablet 1   No current facility-administered medications for this encounter.     REVIEW OF SYSTEMS:  REVIEW OF SYSTEMS: A 10+ POINT REVIEW OF SYSTEMS WAS OBTAINED including neurology, dermatology, psychiatry, cardiac, respiratory, lymph, extremities, GI, GU, musculoskeletal, constitutional, reproductive, HEENT.On the provided form, she reports chills, night sweats, wearing  glasses, heartburn, abdominal pain, breast lump/pain, back pain, joint pain, arthritis, anxiety, and swollen lymph glands. She denies any other symptoms.    PHYSICAL EXAM:  Vitals with BMI 11/04/2018  Height '5\' 6"'   Weight 185 lbs 5 oz  BMI 213.24 Systolic 1401 Diastolic 92  Pulse 68  Respirations 18   Lungs are clear to auscultation bilaterally. Heart has regular rate and rhythm. No palpable cervical, supraclavicular, or axillary adenopathy. Abdomen soft, non-tender, normal bowel sounds. Breast: Left breast large and pendulous without mass or nipple discharge. Right breast large and pendulous without nipple discharge or bleeding. Some thickening and bruising in the upper aspect of the breast at the biopsy site.   ECOG = 1  0 - Asymptomatic (Fully active, able to carry on all predisease activities without restriction)  1 - Symptomatic but completely ambulatory (Restricted in physically strenuous activity but ambulatory and able to carry out work of a light or sedentary nature. For example, light housework, office work)  2 - Symptomatic, <50% in bed during the day (Ambulatory and capable of all self care but unable to carry out any work activities. Up and about more than 50% of waking hours)  3 - Symptomatic, >50% in bed, but not bedbound (Capable of only limited self-care, confined to bed or chair 50% or more of waking hours)  4 - Bedbound (Completely disabled. Cannot carry on any self-care. Totally confined to bed or chair)  5 - Death   OEustace PenMM, Creech RH, Tormey DC, et al. [phone]. "Toxicity and response criteria of the [place] Federation  Cooperative Oncology Group". [place] Oncol. 5 (6): 649-55  LABORATORY DATA:  Lab Results  Component Value Date   WBC 5.9 11/04/2018   HGB 13.9 11/04/2018   HCT 41.2 11/04/2018   MCV 85.7 11/04/2018   PLT 283 11/04/2018   Lab Results  Component Value Date   NA 144 11/04/2018   K 3.7 11/04/2018   CL 107 11/04/2018   CO2 28 11/04/2018   Lab  Results  Component Value Date   ALT 19 11/04/2018   AST 19 11/04/2018   ALKPHOS 99 11/04/2018   BILITOT 0.8 11/04/2018    PULMONARY FUNCTION TEST:   Recent Review Flowsheet Data    There is no flowsheet data to display.      RADIOGRAPHY: US Breast Ltd Uni Right Inc Axilla  Result Date: 10/28/2018 CLINICAL DATA:  58 year old female recalled from screening mammogram dated 10/21/2018 for a right breast mass. The patient states she has developed a palpable lump in the superior right breast over the last 1-2 weeks. EXAM: ULTRASOUND OF THE RIGHT BREAST COMPARISON:  Previous exam(s). FINDINGS: On physical exam, I palpate a firm, fixed mass in the superior right breast. Targeted ultrasound is performed, showing an irregular hypoechoic mass at the 1 o'clock position 4 cm from the nipple. It measures 2.8 x 2.4 x 1.8 cm. There is associated peripheral and internal vascularity. An oval, circumscribed hypoechoic mass is also identified within the vicinity at the 1 o'clock position 4 cm from the nipple. It measures 10 x 9 x 4 mm. This corresponds with an additional mammographically identified mass in the upper inner quadrant at middle depth. This is mammographically stable dating back to at least 2014. Additional stable, circumscribed masses are scattered throughout the bilateral breasts mammographically. Evaluation of the right axilla demonstrates a single, markedly abnormal lymph node with cortical thickening up to 1 cm. No additional suspicious lymphadenopathy is identified. IMPRESSION: 1. Highly suspicious right breast mass corresponding with the screening mammographic findings and the patient's palpable lump. Recommendation is for ultrasound-guided biopsy. 2. A single suspicious right axillary lymph node. Recommendation is for ultrasound-guided biopsy. 3. Additional benign circumscribed mass in the vicinity of the index lesion. This corresponds with a mammographically stable mass dating back to at least 2014.  RECOMMENDATION: 1. Two area ultrasound-guided biopsy of the right breast and right axilla. 2. Given the patient's breast density, bilateral contrast-enhanced breast MRI is recommended pending biopsy results. I have discussed the findings and recommendations with the patient. Results were also provided in writing at the conclusion of the visit. If applicable, a reminder letter will be sent to the patient regarding the next appointment. BI-RADS CATEGORY  5: Highly suggestive of malignancy. Electronically Signed   By: Kristopher Oppenheim M.D.   On: 10/28/2018 13:11   Mm 3d Screen Breast Bilateral  Result Date: 10/23/2018 CLINICAL DATA:  Screening. EXAM: DIGITAL SCREENING BILATERAL MAMMOGRAM WITH TOMO AND CAD COMPARISON:  Previous exam(s). ACR Breast Density Category b: There are scattered areas of fibroglandular density. FINDINGS: In the right breast, a possible mass warrants further evaluation. In the left breast, no findings suspicious for malignancy. Images were processed with CAD. IMPRESSION: Further evaluation is suggested for possible mass in the right breast. RECOMMENDATION: Ultrasound of the right breast. (Code:[id number]-R-29M) The patient will be contacted regarding the findings, and additional imaging will be scheduled. BI-RADS CATEGORY  0: Incomplete. Need additional imaging evaluation and/or prior mammograms for comparison. Electronically Signed   By: Abelardo Diesel M.D.   On: 10/23/2018  08:32   [place] Axillary Node Core Biopsy Right  Addendum Date: 10/30/2018   ADDENDUM REPORT: 10/30/2018 12:08 ADDENDUM: Pathology revealed GRADE II INVASIVE DUCTAL CARCINOMA of the Right breast, 1 o'clock, 4 cm fn. TUMOR CELLS WITHIN FRAGMENTED NODAL TISSUE of the Right axilla. This was found to be concordant by Dr. Curlene Dolphin. Pathology results were discussed with the patient by telephone. The patient reported doing well after the biopsies with tenderness at the sites. Post biopsy instructions and care were reviewed and questions  were answered. The patient was encouraged to call [facility] for any additional concerns. The patient was referred to [facility] on November 04, 2018. Given the patient's breast density, bilateral contrast-enhanced breast MRI is recommended. Pathology results reported by Terie Purser, RN on 10/30/2018. Electronically Signed   By: Curlene Dolphin M.D.   On: 10/30/2018 12:08   Result Date: 10/30/2018 CLINICAL DATA:  Ultrasound-guided core needle biopsy was recommended of a suspicious right axillary lymph node. The patient is also having a suspicious right breast mass biopsied today. EXAM: [place] AXILLARY NODE CORE BIOPSY RIGHT COMPARISON:  Previous exam(s). FINDINGS: I met with the patient and we discussed the procedure of ultrasound-guided biopsy, including benefits and alternatives. We discussed the high likelihood of a successful procedure. We discussed the risks of the procedure, including infection, bleeding, tissue injury, clip migration, and inadequate sampling. Informed written consent was given. The usual time-out protocol was performed immediately prior to the procedure. Using sterile technique and 1% Lidocaine as local anesthetic, under direct ultrasound visualization, a 14 gauge spring-loaded device was used to perform biopsy of a right axillary lymph node using a lateral approach. At the conclusion of the procedure a HydroMARK tissue marker clip was deployed into the biopsy cavity. Follow up 2 view mammogram was performed and dictated separately. IMPRESSION: Ultrasound guided biopsy of a right axillary lymph node. No apparent complications. Electronically Signed: By: Curlene Dolphin M.D. On: 10/28/2018 16:01   Mm Clip Placement Right  Result Date: 10/28/2018 CLINICAL DATA:  Ultrasound-guided biopsies of a right breast mass and of a suspicious right axillary lymph node performed today. EXAM: DIAGNOSTIC RIGHT  MAMMOGRAM POST ULTRASOUND BIOPSIES COMPARISON:  Previous exam(s). FINDINGS: Mammographic images were obtained following ULTRASOUND guided biopsy of a right breast mass at 1 o'clock position. A ribbon shaped biopsy clip is satisfactorily positioned along the inferomedial aspect of the mass. A HydroMARK biopsy clip is satisfactorily positioned within an enlarged lymph node in the right axilla. IMPRESSION: Satisfactory position of ribbon shaped biopsy clip in the inferomedial portion of the right breast mass. Satisfactory position of HydroMARK biopsy clip in the right axillary lymph node. Final Assessment: Post Procedure Mammograms for Marker Placement Electronically Signed   By: Curlene Dolphin M.D.   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 TUMOR CELLS WITHIN FRAGMENTED NODAL TISSUE of the Right axilla. This was found to be concordant by Dr. Curlene Dolphin. Pathology results were discussed with the patient by telephone. The patient reported doing well after the biopsies with tenderness at the sites. Post biopsy instructions and care were reviewed and questions were answered. The patient was encouraged to call [facility] for any  additional concerns. The patient was referred to [facility] on November 04, 2018. Given the patient's breast density, bilateral contrast-enhanced breast MRI is recommended. Pathology results reported by Terie Purser, RN on 10/30/2018. Electronically Signed   By: Curlene Dolphin M.D.   On: 10/30/2018 12:09   Result Date: 10/30/2018 CLINICAL DATA:  Ultrasound-guided core needle biopsy was recommended of a suspicious palpable mass in the upper inner right breast. EXAM: ULTRASOUND GUIDED RIGHT  BREAST CORE NEEDLE BIOPSY COMPARISON:  Previous exam(s). FINDINGS: I met with the patient and we discussed the procedure of ultrasound-guided biopsy, including benefits and alternatives. We discussed the high likelihood of a successful procedure. We discussed the risks of the procedure, including infection, bleeding, tissue injury, clip migration, and inadequate sampling. Informed written consent was given. The usual time-out protocol was performed immediately prior to the procedure. Lesion quadrant: Upper inner quadrant Using sterile technique and 1% Lidocaine as local anesthetic, under direct ultrasound visualization, a 12 gauge spring-loaded device was used to perform biopsy of a palpable hypoechoic mass 1 o'clock position using a lateral approach. At the conclusion of the procedure a ribbon tissue marker clip was deployed into the biopsy cavity. Follow up 2 view mammogram was performed and dictated separately. IMPRESSION: Ultrasound guided biopsy of the right breast. No apparent complications. Electronically Signed: By: Curlene Dolphin M.D. On: 10/28/2018 16:00      IMPRESSION: Stage II-A (T2, N1, Mx) Right Breast UIQ Invasive Ductal Carcinoma, ER+ / PR- / Her2+, Grade 2. Patient would appear to be a good candidate for breast conservation with radiotherapy directed to right breast as well as elective coverage of the axillary region. Patient is interested in breast conserving surgery. We discussed the general course of radiation, potential side effects, and toxicities with radiation and the patient is interested in this approach.    PLAN:  1. MRI 2. Port 3. Neoadjuvant chemotherapy 4. Surgery to be determined at a later date 38. XRT 6. AI 7. US Thyroid 8. Echo 9. Chemo class   ------------------------------------------------  Blair Promise, PhD, MD This document serves as a record of services personally performed by Gery Pray, MD. It was created on his behalf by Mary-Margaret Loma Messing, a  trained medical scribe. The creation of this record is based on the scribe's personal observations and the provider's statements to them. This document has been checked and approved by the attending provider.

## 2018-11-04 NOTE — Progress Notes (Unsigned)
Nutrition Assessment  Reason for Assessment:  Pt seen in Breast Clinic  ASSESSMENT:   58 year old female with new diagnosis of breast cancer.  Past medical history reviewed.  Patient reports has been trying to loose weight over the past years.    Medications:  Saxenda (wt loss drug)  Labs: reviewed  Anthropometrics:   Height: 66 inches Weight: 185 lb 4.8 oz BMI: 29  Patient reports intentional weight loss.   NUTRITION DIAGNOSIS: Food and nutrition related knowledge deficit related to new diagnosis of breast cancer as evidenced by no prior need for nutrition related information.  INTERVENTION:   Discussed and provided packet of information regarding nutritional tips for breast cancer patients.  Questions answered.  Teachback method used.  Contact information provided and patient knows to contact me with questions/concerns.    MONITORING, EVALUATION, and GOAL: Pt will consume a healthy plant based diet to maintain lean body mass throughout treatment.   Jahmal Dunavant B. Zenia Resides, [place], [place] Registered Dietitian [phone] (pager)

## 2018-11-04 NOTE — Therapy (Signed)
[place], [place], [address] Phone: [phone]   Fax:  [phone]  Physical Therapy Evaluation  Patient Details  Name: Debbie [name] MRN: [id number] Date of Birth: [birth year] Referring Provider (PT): Dr. Alphonsa Overall   Encounter Date: 11/04/2018  PT End of Session - 11/04/18 1413    Visit Number  1    Number of Visits  1    PT Start Time  6811    PT Stop Time  1150    PT Time Calculation (min)  28 min    Activity Tolerance  Patient tolerated treatment well    Behavior During Therapy  [facility] for tasks assessed/performed       Past Medical History:  Diagnosis Date  . Anxiety   . Asthma    with severe URIs  . Bronchitis   . Essential hypertension, benign 12/01/2012  . Generalized anxiety disorder 12/01/2012  . Hypertension   . Seasonal allergies     Past Surgical History:  Procedure Laterality Date  . ABDOMINAL HYSTERECTOMY  2011  . ANKLE SURGERY    . BREAST CYST EXCISION Bilateral   . BREAST EXCISIONAL BIOPSY Bilateral   . CHOLECYSTECTOMY  2012  . KNEE ARTHROSCOPY W/ MEDIAL COLLATERAL LIGAMENT (MCL) REPAIR Left 10-07-2012  . TONSILLECTOMY  1969    There were no vitals filed for this visit.   Subjective Assessment - 11/04/18 1153    Subjective  Patient reports she is here today to be seen by her medical team for her newly diagnosed right breast cancer.    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 Her mass measures 2.8 cm and is located in the upper inner quadrant. It is ER positive, PR negative, and HER2 positive with a Ki67 of 20%.     Patient Stated Goals  Lymphedema risk reduction and post op shoulder ROM HEP    Currently in Pain?  No/denies         [facility] PT Assessment - 11/04/18 0001      Assessment   Medical Diagnosis  Right breast cancer    Referring  Provider (PT)  Dr. Alphonsa Overall    Onset Date/Surgical Date  10/21/18    Hand Dominance  Right    Prior Therapy  none      Precautions   Precautions  Other (comment)    Precaution Comments  active cancer      Restrictions   Weight Bearing Restrictions  No      Balance Screen   Has the patient fallen in the past 6 months  No    Has the patient had a decrease in activity level because of a fear of falling?   No    Is the patient reluctant to leave their home because of a fear of falling?   No      Home Environment   Living Environment  Private residence    Living Arrangements  Spouse/significant other;Children   Husband and 39 y.o. 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    Leisure  She does kickboxing and [employer] training 2x/week      Cognition   Overall Cognitive Status  Within [employer]  Limits for tasks assessed      Posture/Postural Control   Posture/Postural Control  Postural limitations    Postural Limitations  Rounded Shoulders;Forward head      ROM / Strength   AROM / PROM / Strength  AROM;Strength      AROM   AROM Assessment Site  Shoulder;Cervical    Right/Left Shoulder  Right;Left    Right Shoulder Extension  62 Degrees    Right Shoulder Flexion  162 Degrees    Right Shoulder ABduction  178 Degrees    Right Shoulder Internal Rotation  88 Degrees    Right Shoulder External Rotation  90 Degrees    Left Shoulder Extension  52 Degrees    Left Shoulder Flexion  164 Degrees    Left Shoulder ABduction  178 Degrees    Left Shoulder Internal Rotation  72 Degrees    Left Shoulder External Rotation  90 Degrees    Cervical Flexion  WNL    Cervical Extension  WNL    Cervical - Right Side Bend  WNL    Cervical - Left Side Bend  WNL    Cervical - Right Rotation  WNL    Cervical - Left Rotation  WNL      Strength   Overall Strength   Within functional limits for tasks performed        LYMPHEDEMA/ONCOLOGY QUESTIONNAIRE - 11/04/18 1258      Type   Cancer Type  Right breast cancer      Lymphedema Assessments   Lymphedema Assessments  Upper extremities      Right Upper Extremity Lymphedema   10 cm Proximal to Olecranon Process  30 cm    Olecranon Process  23.5 cm    10 cm Proximal to Ulnar Styloid Process  21.1 cm    Just Proximal to Ulnar Styloid Process  14.1 cm    Across Hand at [employer]  18.1 cm    At [place] of 2nd Digit  5.6 cm      Left Upper Extremity Lymphedema   10 cm Proximal to Olecranon Process  29.3 cm    Olecranon Process  24.9 cm    10 cm Proximal to Ulnar Styloid Process  20.6 cm    Just Proximal to Ulnar Styloid Process  13.8 cm    Across Hand at [employer]  17.9 cm    At [place] of 2nd Digit  5.4 cm          Quick Dash - 11/04/18 0001    Open a tight or new jar  Mild difficulty    Do heavy household chores (wash walls, wash floors)  No difficulty    Carry a shopping bag or briefcase  No difficulty    Wash your back  No difficulty    Use a knife to cut food  No difficulty    Recreational activities in which you take some force or impact through your arm, shoulder, or hand (golf, hammering, tennis)  No difficulty    During the past week, to what extent has your arm, shoulder or hand problem interfered with your normal social activities with family, friends, neighbors, or groups?  Not at all    During the past week, to what extent has your arm, shoulder or hand problem limited your work or other regular daily activities  Not at all    Arm, shoulder, or hand pain.  None    Tingling (pins and needles) in your arm,  shoulder, or hand  None    Difficulty Sleeping  Mild difficulty    DASH Score  4.55 %        Objective measurements completed on examination: See above findings.     Patient was instructed today in a home exercise program today for post op shoulder range of motion.  These included active assist shoulder flexion in sitting, scapular retraction, wall walking with shoulder abduction, and hands behind head external rotation.  She was encouraged to do these twice a day, holding 3 seconds and repeating 5 times when permitted by her physician.        PT Education - 11/04/18 1259    Education Details  Lymphedema risk reduction and post op shoulder ROM HEP    Person(s) Educated  Patient;Child(ren)    Methods  Explanation;Demonstration;Handout    Comprehension  Returned demonstration;Verbalized understanding           Breast Clinic Goals - 11/04/18 1441      Patient will be able to verbalize understanding of pertinent lymphedema risk reduction practices relevant to her diagnosis specifically related to skin care.   Time  1    Period  Days    Status  Achieved      Patient will be able to return demonstrate and/or verbalize understanding of the post-op home exercise program related to regaining shoulder range of motion.   Time  1    Period  Days    Status  Achieved      Patient will be able to verbalize understanding of the importance of attending the postoperative After Breast Cancer Class for further lymphedema risk reduction education and therapeutic exercise.   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 Her mass measures 2.8 cm and is located in the upper inner quadrant. It is ER positive, PR negative, and HER2 positive with a Ki67 of 20%.  Her multidisciplinary medical team met prior to her assessments to determine a recommended treatment plan. She is planning to have neoadjuvant chemotherapy followed by a right lumpectomy and targeted axillary lymph node dissection, radiation, and anti-estrogen therapy. She will benefit from post op PT to regain shoudler ROM and reduce her  risk of lymphedema.    Stability/Clinical Decision Making  Stable/Uncomplicated    Clinical Decision Making  Low    Rehab Potential  Excellent    PT Frequency  One time visit    PT Treatment/Interventions  ADLs/Self Care Home Management;Patient/family education;Therapeutic exercise    PT Next Visit Plan  Will reassess post op if referred by MD    PT Home Exercise Plan  Post op shoulder ROM HEP    Consulted and Agree with Plan of Care  Patient;Family member/caregiver    Family Member Consulted  Husband and 67 y.o. daughter       Patient will benefit from skilled therapeutic intervention in order to improve the following deficits and impairments:  Decreased range of motion, Impaired UE functional use, Pain, Decreased knowledge of precautions, Postural dysfunction  Visit Diagnosis: Malignant neoplasm of upper-inner quadrant of right breast in female, estrogen receptor positive ([place]) - Plan: PT plan of care cert/re-cert  Abnormal posture - Plan: PT plan of care cert/re-cert   Patient will follow up at outpatient  cancer rehab 3-4 weeks following surgery.  If the patient requires physical therapy at that time, a specific plan will be dictated and sent to the referring physician for approval. The patient was educated today on appropriate basic range of motion exercises to begin post operatively and the importance of attending the After Breast Cancer class following surgery.  Patient was educated today on lymphedema risk reduction practices as it pertains to recommendations that will benefit the patient immediately following surgery.  She verbalized good understanding.      Problem List Patient Active Problem List   Diagnosis Date Noted  . Malignant neoplasm of upper-inner quadrant of right breast in female, estrogen receptor positive ([place]) 11/02/2018  . Multiple food allergies 07/20/2018  . Hypotension 04/29/2018  . Family history of stroke 04/29/2018  . Primary osteoarthritis of both hands  04/29/2018  . Primary osteoarthritis of both feet 04/29/2018  . Positive ANA (antinuclear antibody) 02/06/2018  . Bilateral hand pain 01/23/2018  . Hand joint stiff, unspecified laterality 01/23/2018  . Periodic limb movement 11/23/2017  . OSA (obstructive sleep apnea) 11/23/2017  . Nocturnal hypoxemia 11/23/2017  . Reactive airway disease, mild intermittent, uncomplicated 73/22/0254  . Snoring 10/20/2017  . Rectocele 10/20/2017  . Non-restorative sleep 10/20/2017  . No energy 10/20/2017  . Chest tightness 07/09/2017  . SOB (shortness of breath) 07/09/2017  . Cough 07/09/2017  . Fungal infection of toenail 01/17/2017  . Large breasts 06/26/2016  . Primary insomnia 06/26/2016  . Mid back pain 06/26/2016  . Chronic shoulder pain 06/26/2016  . Labral tear of shoulder 08/29/2014  . Elevated LFTs 05/11/2013  . Class 1 obesity due to excess calories with serious comorbidity and body mass index (BMI) of 30.0 to 30.9 in adult 03/05/2013  . Gastritis 02/17/2013  . History of hysterectomy 01/04/2013  . History of shingles 01/04/2013  . Essential hypertension, benign 12/01/2012  . Generalized anxiety disorder 12/01/2012  . Family history of colon cancer 12/01/2012   Annia Friendly, PT 11/04/18 2:42 PM  [place], [place], [address] Phone: [phone]   Fax:  [phone]  Name: JERRI GLAUSER MRN: [id number] Date of Birth: July 06, 1961

## 2018-11-06 ENCOUNTER — Ambulatory Visit (HOSPITAL_COMMUNITY)
Admission: RE | Admit: 2018-11-06 | Discharge: 2018-11-06 | Disposition: A | Discharge status: Home / Self Care | Payer: BLUE CROSS/BLUE SHIELD | Source: Ambulatory Visit | Attending: Oncology | Admitting: Oncology

## 2018-11-06 ENCOUNTER — Other Ambulatory Visit: Payer: Self-pay | Admitting: Oncology

## 2018-11-06 ENCOUNTER — Encounter: Payer: Self-pay | Admitting: Physician Assistant

## 2018-11-06 ENCOUNTER — Other Ambulatory Visit: Payer: Self-pay

## 2018-11-06 DIAGNOSIS — Z170 Estrogen receptor positive status [ER+]: Secondary | ICD-10-CM | POA: Diagnosis not present

## 2018-11-06 DIAGNOSIS — C50211 Malignant neoplasm of upper-inner quadrant of right female breast: Secondary | ICD-10-CM | POA: Diagnosis not present

## 2018-11-06 DIAGNOSIS — E042 Nontoxic multinodular goiter: Secondary | ICD-10-CM | POA: Diagnosis not present

## 2018-11-09 ENCOUNTER — Telehealth: Payer: Self-pay | Admitting: Physician Assistant

## 2018-11-09 ENCOUNTER — Other Ambulatory Visit: Payer: Self-pay

## 2018-11-09 ENCOUNTER — Encounter: Payer: Self-pay | Admitting: Physician Assistant

## 2018-11-09 ENCOUNTER — Ambulatory Visit (HOSPITAL_COMMUNITY)
Admission: RE | Admit: 2018-11-09 | Discharge: 2018-11-09 | Disposition: A | Discharge status: Home / Self Care | Payer: BLUE CROSS/BLUE SHIELD | Source: Ambulatory Visit | Attending: Oncology | Admitting: Oncology

## 2018-11-09 ENCOUNTER — Telehealth: Payer: Self-pay | Admitting: *Deleted

## 2018-11-09 ENCOUNTER — Encounter: Payer: Self-pay | Admitting: *Deleted

## 2018-11-09 ENCOUNTER — Ambulatory Visit: Payer: BLUE CROSS/BLUE SHIELD | Admitting: Physician Assistant

## 2018-11-09 VITALS — BP 133/78 | HR 70 | Temp 98.7°F | Ht 66.0 in | Wt 184.0 lb

## 2018-11-09 DIAGNOSIS — Z170 Estrogen receptor positive status [ER+]: Secondary | ICD-10-CM

## 2018-11-09 DIAGNOSIS — R140 Abdominal distension (gaseous): Secondary | ICD-10-CM | POA: Diagnosis not present

## 2018-11-09 DIAGNOSIS — C50211 Malignant neoplasm of upper-inner quadrant of right female breast: Secondary | ICD-10-CM | POA: Diagnosis not present

## 2018-11-09 DIAGNOSIS — C50911 Malignant neoplasm of unspecified site of right female breast: Secondary | ICD-10-CM | POA: Diagnosis not present

## 2018-11-09 DIAGNOSIS — R1011 Right upper quadrant pain: Secondary | ICD-10-CM

## 2018-11-09 LAB — POCT URINALYSIS DIPSTICK
Color, UA: 6546
Glucose, UA: NEGATIVE
Ketones, UA: NEGATIVE
Leukocytes, UA: NEGATIVE
Nitrite, UA: NEGATIVE
Protein, UA: NEGATIVE
RBC UA: NEGATIVE
Spec Grav, UA: >=1.03 — AB (ref 1.010–1.025)
UROBILINOGEN UA: 0.2 U/dL
pH, UA: 5.5 (ref 5.0–8.0)

## 2018-11-09 MED ORDER — IOHEXOL 300 MG/ML  SOLN
75.0000 mL | Freq: Once | INTRAMUSCULAR | Status: AC | PRN
  Administered 2018-11-09: 75 mL via INTRAVENOUS

## 2018-11-09 MED ORDER — GADOBUTROL 1 MMOL/ML IV SOLN
8.0000 mL | Freq: Once | INTRAVENOUS | Status: AC | PRN
  Administered 2018-11-09: 8 mL via INTRAVENOUS

## 2018-11-09 MED ORDER — SODIUM CHLORIDE (PF) 0.9 % IJ SOLN
INTRAMUSCULAR | Status: AC
  Filled 2018-11-09: qty 50

## 2018-11-09 NOTE — Telephone Encounter (Signed)
Called pt to discuss [place] from 3.18.20. Unable to leave msg d/t mailbox full. Verified email sent to pt to access needs.

## 2018-11-09 NOTE — Telephone Encounter (Signed)
Patient scheduled.

## 2018-11-09 NOTE — Progress Notes (Signed)
l °

## 2018-11-09 NOTE — Telephone Encounter (Signed)
[place] CT department and scheduled patient to have her CT of Abdomen and pelvis w/wo contrast tomorrow 11/10/2018 at 3:30 pm. Arrive at 3:15.  If did not receive the test kit for drinking the contrast then arrive at hospital 2 hours prior to appointment to start drinking contrast.  Called patient and left VM of all this information. KG LPN

## 2018-11-09 NOTE — Progress Notes (Signed)
Subjective:    Patient ID: Debbie [name], female    DOB: 09-Mar-1961, 58 y.o.   MRN: [id number]  HPI  Pt is a 58 yo female with HTN, recent dx of right breast cancer who presents to the clinic with RLQ pain for the past 5 days. She feels "like there is something inside her making her feel full". She feels very bloated and a lot of pressure in the right lower quadrant. Her sharp pain waxes and wanes. She is taking OTC tylenol and advil. No fever, nausea, vomiting, constipation. She does have ongoing more loose stools. No hematochezia or melena. She does not have her gallbladder. She had a hysterectomy but left her ovaries. She just had labs done last Friday from oncology. WBC and CMP look great. Denies any GERD symptoms. No radiation into mid back. She does have some right low back pain.   Her father did have colon cancer.   She has a breast MRI and chest CT ordered for [facility] today via oncologist.   .. Active Ambulatory Problems    Diagnosis Date Noted  . Essential hypertension, benign 12/01/2012  . Generalized anxiety disorder 12/01/2012  . Family history of colon cancer 12/01/2012  . History of hysterectomy 01/04/2013  . History of shingles 01/04/2013  . Gastritis 02/17/2013  . Class 1 obesity due to excess calories with serious comorbidity and body mass index (BMI) of 30.0 to 30.9 in adult 03/05/2013  . Elevated LFTs 05/11/2013  . Labral tear of shoulder 08/29/2014  . Large breasts 06/26/2016  . Primary insomnia 06/26/2016  . Mid back pain 06/26/2016  . Chronic shoulder pain 06/26/2016  . Fungal infection of toenail 01/17/2017  . Chest tightness 07/09/2017  . SOB (shortness of breath) 07/09/2017  . Cough 07/09/2017  . Snoring 10/20/2017  . Rectocele 10/20/2017  . Non-restorative sleep 10/20/2017  . No energy 10/20/2017  . Reactive airway disease, mild intermittent, uncomplicated 78/46/9629  . Periodic limb movement 11/23/2017  . OSA (obstructive sleep apnea) 11/23/2017  .  Nocturnal hypoxemia 11/23/2017  . Bilateral hand pain 01/23/2018  . Hand joint stiff, unspecified laterality 01/23/2018  . Positive ANA (antinuclear antibody) 02/06/2018  . Hypotension 04/29/2018  . Family history of stroke 04/29/2018  . Primary osteoarthritis of both hands 04/29/2018  . Primary osteoarthritis of both feet 04/29/2018  . Multiple food allergies 07/20/2018  . Malignant neoplasm of upper-inner quadrant of right breast in female, estrogen receptor positive ([place]) 11/02/2018   Resolved Ambulatory Problems    Diagnosis Date Noted  . No Resolved Ambulatory Problems   Past Medical History:  Diagnosis Date  . Anxiety   . Asthma   . Bronchitis   . Hypertension   . Seasonal allergies      Review of Systems    see HPI>  Objective:   Physical Exam Vitals signs reviewed.  Constitutional:      Appearance: Normal appearance.  HENT:     Head: Normocephalic and atraumatic.  Cardiovascular:     Rate and Rhythm: Normal rate and regular rhythm.  Pulmonary:     Effort: Pulmonary effort is normal.     Breath sounds: Normal breath sounds.  Abdominal:     General: Bowel sounds are normal. There is distension.     Tenderness: There is abdominal tenderness. There is guarding. There is no right CVA tenderness, left CVA tenderness or rebound.     Comments: Tenderness over RLQ with some mild guarding.  Tenderness over epigastric area without guarding.  No palpable mass.   Neurological:     General: No focal deficit present.     Mental Status: She is alert and oriented to person, place, and time.  Psychiatric:        Mood and Affect: Mood normal.           Assessment & Plan:  Marland KitchenMarland KitchenPeggie was seen today for flank pain.  Diagnoses and all orders for this visit:  Right upper quadrant pain -     CT ABDOMEN PELVIS W WO CONTRAST -     POCT Urinalysis Dipstick  Abdominal distension (gaseous) -     CT ABDOMEN PELVIS W WO CONTRAST  Malignant neoplasm of upper-inner quadrant  of right breast in female, estrogen receptor positive ([place])   .Marland Kitchen Results for orders placed or performed in visit on 11/09/18  POCT Urinalysis Dipstick  Result Value Ref Range   Color, UA 6,546    Clarity, UA Clear    Glucose, UA Negative Negative   Bilirubin, UA Small    Ketones, UA Negative    Spec Grav, UA >=1.030 (A) 1.010 - 1.025   Blood, UA Negative    pH, UA 5.5 5.0 - 8.0   Protein, UA Negative Negative   Urobilinogen, UA 0.2 0.2 or 1.0 E.U./dL   Nitrite, UA Negative    Leukocytes, UA Negative Negative   Appearance     Odor     Urine looks fine. I do not suspect UTI.   Concerned for ovarian mass vs appendicitis vs colitis vs diverticulitis vs abscess. Need CT of abd and pelvis. Will try to get scheduled today.  No labs needed at this time. If spike fever or symptoms suddenly worsen go to ED. Stay hydrated. Make sure taking probiotic. Scan will determine next steps in plan.   Up to date colonoscopy.   Marland Kitchen.Spent 30 minutes with patient and greater than 50 percent of visit spent counseling patient regarding treatment plan.

## 2018-11-09 NOTE — Progress Notes (Signed)
[facility] Psychosocial Distress Screening Clinical Social Work  Clinical Social Work was referred by distress screening protocol.  The patient scored a 4 on the Psychosocial Distress Thermometer which indicates mild distress. Clinical Social Worker contacted patient at home to assess for distress and other psychosocial needs, and follow up after [facility].  Patients voicemail was full, and CSW was unable to leave a message.  CSW will continue attempting to contact patient.     ONCBCN DISTRESS SCREENING 11/09/2018  Screening Type Initial Screening  Distress experienced in past week (1-10) 4  Emotional problem type Nervousness/Anxiety;Adjusting to illness  Spiritual/Religous concerns type Facing my mortality  Information Concerns Type Lack of info about diagnosis;Lack of info about treatment;Lack of info about complementary therapy choices;Lack of info about maintaining fitness    Johnnye Lana, MSW, LCSW, OSW-C Clinical Social Worker [facility] [phone]

## 2018-11-10 ENCOUNTER — Ambulatory Visit (HOSPITAL_COMMUNITY)
Admission: RE | Admit: 2018-11-10 | Discharge: 2018-11-10 | Disposition: A | Discharge status: Home / Self Care | Payer: BLUE CROSS/BLUE SHIELD | Source: Ambulatory Visit | Attending: Physician Assistant | Admitting: Physician Assistant

## 2018-11-10 DIAGNOSIS — Z170 Estrogen receptor positive status [ER+]: Secondary | ICD-10-CM | POA: Diagnosis not present

## 2018-11-10 DIAGNOSIS — R140 Abdominal distension (gaseous): Secondary | ICD-10-CM | POA: Insufficient documentation

## 2018-11-10 DIAGNOSIS — R1011 Right upper quadrant pain: Secondary | ICD-10-CM | POA: Insufficient documentation

## 2018-11-10 DIAGNOSIS — K769 Liver disease, unspecified: Secondary | ICD-10-CM | POA: Diagnosis not present

## 2018-11-10 DIAGNOSIS — C50211 Malignant neoplasm of upper-inner quadrant of right female breast: Secondary | ICD-10-CM

## 2018-11-10 DIAGNOSIS — C50911 Malignant neoplasm of unspecified site of right female breast: Secondary | ICD-10-CM | POA: Diagnosis not present

## 2018-11-10 MED ORDER — IOHEXOL 300 MG/ML  SOLN
100.0000 mL | Freq: Once | INTRAMUSCULAR | Status: AC | PRN
  Administered 2018-11-10: 100 mL via INTRAVENOUS

## 2018-11-10 MED ORDER — SODIUM CHLORIDE (PF) 0.9 % IJ SOLN
INTRAMUSCULAR | Status: AC
  Filled 2018-11-10: qty 50

## 2018-11-10 MED ORDER — IOHEXOL 300 MG/ML  SOLN
30.0000 mL | Freq: Once | INTRAMUSCULAR | Status: AC | PRN
  Administered 2018-11-10: 30 mL via ORAL

## 2018-11-10 NOTE — Addendum Note (Signed)
Addended by: Alena Bills R on: 11/10/2018 03:01 PM   Modules accepted: Orders

## 2018-11-10 NOTE — Progress Notes (Signed)
Order changed per radiology protocol.  

## 2018-11-11 ENCOUNTER — Other Ambulatory Visit: Payer: Self-pay | Admitting: Surgery

## 2018-11-11 ENCOUNTER — Other Ambulatory Visit: Payer: Self-pay

## 2018-11-11 ENCOUNTER — Other Ambulatory Visit (HOSPITAL_COMMUNITY): Payer: Self-pay | Admitting: Surgery

## 2018-11-11 ENCOUNTER — Ambulatory Visit (HOSPITAL_COMMUNITY)
Admission: RE | Admit: 2018-11-11 | Discharge: 2018-11-11 | Disposition: A | Discharge status: Home / Self Care | Payer: BLUE CROSS/BLUE SHIELD | Source: Ambulatory Visit | Attending: Oncology | Admitting: Oncology

## 2018-11-11 ENCOUNTER — Encounter: Payer: Self-pay | Admitting: Physician Assistant

## 2018-11-11 ENCOUNTER — Inpatient Hospital Stay: Payer: BLUE CROSS/BLUE SHIELD

## 2018-11-11 DIAGNOSIS — Z170 Estrogen receptor positive status [ER+]: Secondary | ICD-10-CM | POA: Insufficient documentation

## 2018-11-11 DIAGNOSIS — C50211 Malignant neoplasm of upper-inner quadrant of right female breast: Secondary | ICD-10-CM | POA: Diagnosis not present

## 2018-11-11 DIAGNOSIS — E041 Nontoxic single thyroid nodule: Secondary | ICD-10-CM

## 2018-11-11 DIAGNOSIS — R935 Abnormal findings on diagnostic imaging of other abdominal regions, including retroperitoneum: Secondary | ICD-10-CM

## 2018-11-11 DIAGNOSIS — K7689 Other specified diseases of liver: Secondary | ICD-10-CM

## 2018-11-11 HISTORY — DX: Abnormal findings on diagnostic imaging of other abdominal regions, including retroperitoneum: R93.5

## 2018-11-11 HISTORY — DX: Other specified diseases of liver: K76.89

## 2018-11-11 MED ORDER — METRONIDAZOLE 500 MG PO TABS: 500.0000 mg | ORAL_TABLET | Freq: Three times a day (TID) | ORAL | 0 refills | Status: DC

## 2018-11-11 MED ORDER — CIPROFLOXACIN HCL 500 MG PO TABS: 500.0000 mg | ORAL_TABLET | Freq: Two times a day (BID) | ORAL | 0 refills | Status: DC

## 2018-11-11 NOTE — Addendum Note (Signed)
Addended by: Donella Stade on: 11/11/2018 09:14 AM   Modules accepted: Orders

## 2018-11-11 NOTE — Progress Notes (Signed)
FYI patient presents to the clinic with RLQ pain. CT of abdomen done. Questionable bladder mass and prominence of bilateral adnexa. Is this something you would like to follow up in leu of recent breast cancer dx? I am going to treat her for colitis. She has follow up with you on Friday.

## 2018-11-11 NOTE — Progress Notes (Signed)
Echocardiogram 2D Echocardiogram has been performed.  Debbie [name] 11/11/2018, 1:12 PM

## 2018-11-12 NOTE — Patient Instructions (Addendum)
Debbie [name]  11/12/2018   Your procedure is scheduled on: 11-16-18    Report to [facility] Main  Entrance    Report to [place] at 5:30 AM    Call this number if you have problems the morning of surgery [phone]    Remember: Do not eat food or drink liquids :After Midnight.  Bring C-PAP mask and tubing   Take these medicines the morning of surgery with A SIP OF WATER: Fexofenadine (Allegra), and Pantoprazole (Protonix), inhaler please bring.                     BRUSH YOUR TEETH MORNING OF SURGERY AND RINSE YOUR MOUTH OUT, NO CHEWING GUM CANDY OR MINTS.              You may not have any metal on your body including hair pins and              piercings  Do not wear jewelry, make-up, lotions, powders or perfumes, deodorant             Do not wear nail polish.  Do not shave  48 hours prior to surgery.                Do not bring valuables to the hospital. [place].  Contacts, dentures or bridgework may not be worn into surgery.     Patients discharged the day of surgery will not be allowed to drive home. IF YOU ARE HAVING SURGERY AND GOING HOME THE SAME DAY, YOU MUST HAVE AN ADULT TO DRIVE YOU HOME AND BE WITH YOU FOR 24 HOURS. YOU MAY GO HOME BY TAXI OR UBER OR ORTHERWISE, BUT AN ADULT MUST ACCOMPANY YOU HOME AND STAY WITH YOU FOR 24 HOURS.    Name and phone number of your driver:Randy cell [phone]                Please read over the following fact sheets you were given: _____________________________________________________________________             [facility] - Preparing for Surgery Before surgery, you can play an important role.  Because skin is not sterile, your skin needs to be as free of germs as possible.  You can reduce the number of germs on your skin by washing with CHG (chlorahexidine gluconate) soap before surgery.  CHG is an antiseptic cleaner which kills germs and bonds  with the skin to continue killing germs even after washing. Please DO NOT use if you have an allergy to CHG or antibacterial soaps.  If your skin becomes reddened/irritated stop using the CHG and inform your nurse when you arrive at Short Stay. Do not shave (including legs and underarms) for at least 48 hours prior to the first CHG shower.  You may shave your face/neck. Please follow these instructions carefully:  1.  Shower with CHG Soap the night before surgery and the  morning of Surgery.  2.  If you choose to wash your hair, wash your hair first as usual with your  normal  shampoo.  3.  After you shampoo, rinse your hair and body thoroughly to remove the  shampoo.  4.  Use CHG as you would any other liquid soap.  You can apply chg directly  to the skin and wash                       Gently with a scrungie or clean washcloth.  5.  Apply the CHG Soap to your body ONLY FROM THE NECK DOWN.   Do not use on face/ open                           Wound or open sores. Avoid contact with eyes, ears mouth and genitals (private parts).                       Wash face,  Genitals (private parts) with your normal soap.             6.  Wash thoroughly, paying special attention to the area where your surgery  will be performed.  7.  Thoroughly rinse your body with warm water from the neck down.  8.  DO NOT shower/wash with your normal soap after using and rinsing off  the CHG Soap.                9.  Pat yourself dry with a clean towel.            10.  Wear clean pajamas.            11.  Place clean sheets on your bed the night of your first shower and do not  sleep with pets. Day of Surgery : Do not apply any lotions/deodorants the morning of surgery.  Please wear clean clothes to the hospital/surgery center.  FAILURE TO FOLLOW THESE INSTRUCTIONS MAY RESULT IN THE CANCELLATION OF YOUR SURGERY PATIENT SIGNATURE_________________________________  NURSE  SIGNATURE__________________________________  ________________________________________________________________________

## 2018-11-12 NOTE — Progress Notes (Signed)
SPOKE W/  ____  Pt via phone     SCREENING SYMPTOMS OF COVID 19:   COUGH---  NO  RUNNY NOSE--- NO  SORE THROAT--- NO  SHORTNESS OF BREATH--- NO  DIFFICULTY BREATHING--- NO  TEMP >100.4-----  NO  HAVE YOU OR ANY FAMILY MEMBER TRAVELLED PAST 14 DAYS OUT OF THE   COUNTY---  NO STATE----  NO COUNTRY----  NO  HAVE YOU OR ANY FAMILY MEMBER BEEN EXPOSED TO ANYONE WITH COVID 19? NO

## 2018-11-12 NOTE — Progress Notes (Signed)
11-11-18 (Epic) ECHO  11-09-18 ( Epic) CT Chest

## 2018-11-12 NOTE — Progress Notes (Signed)
Debbie [name]  [phone] [phone]    ID: Debbie [name] DOB: [birth year]  MR#: [id number]  JYN#:[id number]  Patient Care Team: Lavada Mesi as PCP - General (Family Medicine) Mauro Kaufmann, RN as Oncology Nurse Navigator Rockwell Germany, RN as Oncology Nurse Navigator Alphonsa Overall, MD as Consulting Physician (General Surgery) Taralee Marcus, Virgie Dad, MD as Consulting Physician (Oncology) Gery Pray, MD as Consulting Physician (Radiation Oncology) OTHER MD: Dr. Elouise Munroe (derma) Truhilio (thyroid)   CHIEF COMPLAINT: HER-2 positive breast cancer  CURRENT TREATMENT: Neoadjuvant chemotherapy    INTERVAL HISTORY: Debbie [name] returns for follow up and treatment of her HER-2 positive breast cancer.  Since her last visit, she underwent thyroid ultrasound on 11/06/2018, with results revealing: thyroid nodules; inferior right thyroid lobe nodule with suspicious characteristics; indeterminate 1.6 cm soft tissue nodule or lymph node inferior to right thyroid lobe; mid right thyroid lobe nodule meets criteria for 1 year follow-up.  She also underwent chest CT scan on 11/09/2018, with results showing: 2.9 cm soft tissue mass in the medial right breast, corresponding to the patient's known primary breast neoplasm; suspected right axillary and subpectoral nodal metastases. CT abdomen/pelvis on 11/10/2018 showed: small pericardial effusion; two less than 5 mm hypoattenuated liver nodules, too small to be accurately characterized by CT; mass-like thickening of the anterior base of the urinary bladder; mild prominence of the bilateral adnexa; nonspecific, probably inflammatory or infectious thickening of the sigmoid colon and rectum; small amount of free fluid in the pelvis.  She also underwent bilateral breast MRI on 11/09/2018, with results revealing: aside from biopsy-proven malignant mass, no other evidence of additional sites of disease in the right breast;  aside from biopsy-proven metastatic lymph node, no additional abnormal lymph nodes are identified; no evidence of left breast malignancy.  On 11/11/2018 she underwent echocardiography showing an ejection fraction in the 55-60%.  This describes the pericardial effusion as "trivial".   She is scheduled for port placement 11/16/2018  She is here today to review results of her staging studies and discuss supportive care for her upcoming chemotherapy.  REVIEW OF SYSTEMS: Debbie [name] is understandably concerned with all the findings in her various scans.  I reassured her that most of them should be false positives but unfortunately were going to have to go over every single 1 of them.  She is already taking Cipro and Flagyl which her gynecologist prescribed for what he feels is a small pelvic infection or diverticular disease.  She is tolerating those well.  Aside from that a detailed review of systems today was stable  HISTORY OF CURRENT ILLNESS: From the original intake note:  Debbie [name] had routine screening mammography on 10/21/2018 showing a possible abnormality in the right breast. She felt a lump in her breast, possibly dating back to 01/2018, but she didn't think anything about it due to her history with fibrous breasts; she has had fibrous breasts all her life and began to have mammograms at the age of 57.   She underwent right breast ultrasonography at [facility] on 10/28/2018 showing: On physical exam, a firm, fixed mass is palpated in the superior right breast. Targeted ultrasound is performed, showing an irregular hypoechoic mass at the 1 o'clock position 4 cm from the nipple. It measures 2.8 x 2.4 x 1.8 cm. There is associated peripheral and internal vascularity. An oval, circumscribed hypoechoic mass is also identified within the vicinity at the 1 o'clock position 4 cm from the nipple. It  measures 1.0 x 0.9 x 0.4 cm. This corresponds with an additional mammographically identified  mass in the upper inner quadrant at middle depth. This is mammographically stable dating back to at least 2014. Additional stable, circumscribed masses are scattered throughout the bilateral breasts mammographically. Evaluation of the right axilla demonstrates a single, markedly abnormal lymph node with cortical thickening up to 1 cm. No additional suspicious lymphadenopathy is identified.  Accordingly on 10/28/2018 she proceeded to biopsy of the right breast area in question. The pathology from this procedure showed ([id number]): invasive ductal carcinoma, nottingham grade II of III, 2.8 cm, 1 o'clock, 4.0 cm from the nipple. Prognostic indicators significant for: estrogen receptor, 90% positive, with strong staining intensity and progesterone receptor, 0% negative. Proliferation marker Ki67 at 20%. HER2 Positive (3+) by immunohistochemistry.   An additional biopsy of the right axilla was performed on the same day ([id number]) showing:  2. Lymph node, needle/core biopsy, inferior right axilla - Tumor cells within fragmented nodal tissue  The patient's subsequent history is as detailed below.  PAST MEDICAL HISTORY: Past Medical History:  Diagnosis Date   Anxiety    Asthma    with severe URIs   Bronchitis    Essential hypertension, benign 12/01/2012   Generalized anxiety disorder 12/01/2012   Hypertension    PONV (postoperative nausea and vomiting)    Seasonal allergies    Sleep apnea      PAST SURGICAL HISTORY: Past Surgical History:  Procedure Laterality Date   ABDOMINAL HYSTERECTOMY  2011   with vaginal sling   ANKLE SURGERY     BREAST CYST EXCISION Bilateral    BREAST EXCISIONAL BIOPSY Bilateral    CHOLECYSTECTOMY  2012   KNEE ARTHROSCOPY W/ MEDIAL COLLATERAL LIGAMENT (MCL) REPAIR Left 10-07-2012   TONSILLECTOMY  1969     FAMILY HISTORY: Family History  Problem Relation Age of Onset   Hypertension Mother    Rheum arthritis Mother    Hypertension [name]     Alcoholism [name]    Colon cancer [name]    Lung cancer [name]    Depression [name]    Diabetes [name]    Skin cancer [name]    Hypertension Brother    Stroke Brother    Atrial fibrillation Brother    Hyperlipidemia Maternal Grandfather    Stroke Paternal 29    Healthy Daughter    Healthy Daughter    Debbie [name] died from lung cancer at age 43. Patients' mother died from congestive heart failure at age 14. The patient has 1 brother. Patient denies anyone in her family having breast, ovarian, prostate, or pancreatic cancer. Debbie [name]'s [name] was diagnosed with skin cancer, colon cancer, and lung cancer. Debbie [name] was diagnosed with skin cancer.    GYNECOLOGIC HISTORY:  No LMP recorded. Patient has had a hysterectomy. 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 Her husband, Amna Welker, is in [employer]	[place]. They have two daughters, Debbie [name] and Debbie [name]. [address] Dawna Part is 8, lives in [place], [place], and works in Building surveyor. Adrian Blackwater El[place] is 66, lives in [place], [place], and is currently in school; he currently works part time as a Publishing copy.   ADVANCED DIRECTIVES: In the absence of any documentation, Debbie [name] spouse, Debbie [name], is her healthcare power of attorney.  HEALTH MAINTENANCE: Social History   Tobacco Use   Smoking status: Never Smoker   Smokeless tobacco: Never Used  Substance Use Topics   Alcohol use: Yes    Comment: 2 q mth   Drug use: No    Colonoscopy: yes, 2015, [facility]   PAP: 2011  Bone density: no Mammography: 10/21/2018  Allergies  Allergen Reactions   Azithromycin    Erythromycin Itching   Levofloxacin     Tendon pain    Penicillins Swelling and Rash    Did it involve swelling of the face/tongue/throat, SOB, or low BP? Yes Did it involve sudden or severe rash/hives, skin peeling, or any reaction on the inside of your mouth or nose? No Did you need to seek medical attention at a hospital or doctor's office? Yes When did it last happen?40+ years ago If all above answers are NO, may proceed with cephalosporin use.     Current Outpatient Medications  Medication Sig Dispense Refill   AMBULATORY NON FORMULARY MEDICATION Continuous positive airway pressure (CPAP) machine set at autopap, with all supplemental supplies as needed. 1 each 0   calcium-vitamin D (OSCAL WITH D) 500-200 MG-UNIT tablet Take 1 tablet by mouth daily with breakfast.     ciprofloxacin (CIPRO) 500 MG tablet Take 1 tablet (500 mg total) by mouth 2 (two) times daily. For 7 days. 14 tablet 0   dexamethasone (DECADRON) 4 MG tablet Take 2 tablets (8 mg total) by mouth 2 (two) times daily. Start the day before Taxotere. Take once the day after, then 2 times a day x 2d. 30 tablet 1   fexofenadine (ALLEGRA) 180 MG tablet Take 180 mg by mouth daily as needed for allergies or rhinitis.     hydrochlorothiazide (HYDRODIURIL) 12.5 MG tablet Take 1 tablet (12.5 mg total) by mouth daily. 90 tablet 1   lidocaine-prilocaine (EMLA) cream Apply to affected area once 30 g 3   Liraglutide -Weight Management (SAXENDA) 18 MG/3ML SOPN Inject 3 mg into the skin daily. Please include ultra fine needles 33m 15 pen 1   LORazepam (ATIVAN) 0.5 MG tablet Take 1 tablet (0.5 mg total) by mouth every 6 (six) hours as needed (Nausea or vomiting). 30 tablet 0   metroNIDAZOLE (FLAGYL) 500 MG tablet Take 1 tablet (500 mg total) by mouth 3 (three) times daily. For 7 days. 21 tablet 0   montelukast (SINGULAIR) 10 MG tablet Take 10 mg by mouth at bedtime.     Multiple Vitamin (MULTIVITAMIN WITH MINERALS) TABS tablet Take 1 tablet by mouth daily.     pantoprazole  (PROTONIX) 40 MG tablet TAKE 1 TABLET DAILY (Patient taking differently: Take 40 mg by mouth daily. ) 90 tablet 4   PROAIR HFA 108 (90 Base) MCG/ACT inhaler USE 2 INHALATIONS EVERY 4 HOURS AS NEEDED FOR WHEEZING (Patient taking differently: Inhale 2 puffs into the lungs every 4 (four) hours as needed for wheezing or shortness of breath. ) 25.5 g 1   Probiotic Product (VSL#3 PO) Take 1 tablet by mouth daily.     prochlorperazine (COMPAZINE) 10 MG tablet Take 1 tablet (10 mg total) by mouth every 6 (six) hours as needed (Nausea or vomiting). 30 tablet 1   rifaximin (XIFAXAN) 550 MG TABS tablet Take 1 tablet (550 mg total) by mouth 3 (three) times daily. For 14 days. 42 tablet 0   valACYclovir (VALTREX) 1000 MG tablet TAKE 2 TABLETS EVERY 12 HOURS FOR ONE DAY, TAKE AT THE ONSET OF COLD SORE (Patient taking  differently: Take 1,000 mg by mouth 2 (two) times daily as needed (cold sore). TAKE 2 TABLETS EVERY 12 HOURS FOR ONE DAY, TAKE AT THE ONSET OF COLD SORE) 21 tablet 1   vitamin B-12 (CYANOCOBALAMIN) 1000 MCG tablet Take 1,000 mcg by mouth daily.     zolpidem (AMBIEN) 5 MG tablet Take 2.5 mg by mouth at bedtime as needed for sleep.     No current facility-administered medications for this visit.      OBJECTIVE: Middle-aged white woman who appears stated age  58:   11/13/18 1250  BP: 130/75  Pulse: 61  Resp: 18  Temp: 98.8 F (37.1 C)  SpO2: 100%     Body mass index is 29.49 kg/m.   Wt Readings from Last 3 Encounters:  11/13/18 182 lb 11.2 oz (82.9 kg)  11/13/18 181 lb 9 oz (82.4 kg)  11/09/18 184 lb (83.5 kg)      ECOG FS:1 - Symptomatic but completely ambulatory  Sclerae unicteric, EOMs intact No cervical or supraclavicular adenopathy Lungs no rales or rhonchi Heart regular rate and rhythm Abd soft, nontender, positive bowel sounds MSK no focal spinal tenderness, no upper extremity lymphedema Neuro: nonfocal, well oriented, appropriate affect Breasts: As noted before  there is an easily palpable mass in the inferior slightly outer aspect of the right breast.  There is no skin or nipple change of concern associated with this.  The left breast is unremarkable.  Both axillae are benign to palpation.   LAB RESULTS:  CMP     Component Value Date/Time   NA 143 11/13/2018 1222   K 3.4 (L) 11/13/2018 1222   CL 106 11/13/2018 1222   CO2 29 11/13/2018 1222   GLUCOSE 101 (H) 11/13/2018 1222   BUN 13 11/13/2018 1222   CREATININE 0.75 11/13/2018 1222   CREATININE 0.79 11/04/2018 0835   CREATININE 0.66 07/08/2017 1127   CALCIUM 9.5 11/13/2018 1222   PROT 6.6 11/04/2018 0835   ALBUMIN 4.0 11/04/2018 0835   AST 19 11/04/2018 0835   ALT 19 11/04/2018 0835   ALKPHOS 99 11/04/2018 0835   BILITOT 0.8 11/04/2018 0835   GFRNONAA >60 11/13/2018 1222   GFRNONAA >60 11/04/2018 0835   GFRNONAA 98 07/08/2017 1127   GFRAA >60 11/13/2018 1222   GFRAA >60 11/04/2018 0835   GFRAA 114 07/08/2017 1127    No results found for: TOTALPROTELP, ALBUMINELP, A1GS, A2GS, BETS, BETA2SER, GAMS, MSPIKE, SPEI  No results found for: KPAFRELGTCHN, LAMBDASER, KAPLAMBRATIO  Lab Results  Component Value Date   WBC 6.1 11/13/2018   NEUTROABS 3.5 11/04/2018   HGB 14.1 11/13/2018   HCT 42.9 11/13/2018   MCV 87.4 11/13/2018   PLT 281 11/13/2018    '@LASTCHEMISTRY' @  No results found for: LABCA2  No components found for: MEQAST419  No results for input(s): INR in the last 168 hours.  No results found for: LABCA2  No results found for: QQI297  No results found for: LGX211  No results found for: HER740  No results found for: CA2729  No components found for: HGQUANT  No results found for: CEA1 / No results found for: CEA1   No results found for: AFPTUMOR  No results found for: [place]  No results found for: [facility] Outpatient Visit on 11/13/2018  Component Date Value Ref Range Status   Sodium 11/13/2018 143  135 - 145 mmol/L Final   Potassium  11/13/2018 3.4* 3.5 - 5.1 mmol/L Final   Chloride 11/13/2018 106  98 -  111 mmol/L Final   CO2 11/13/2018 29  22 - 32 mmol/L Final   Glucose, Bld 11/13/2018 101* 70 - 99 mg/dL Final   BUN 11/13/2018 13  6 - 20 mg/dL Final   Creatinine, [name] 11/13/2018 0.75  0.44 - 1.00 mg/dL Final   Calcium 11/13/2018 9.5  8.9 - 10.3 mg/dL Final   GFR calc non Af Amer 11/13/2018 >60  >60 mL/min Final   GFR calc Af Amer 11/13/2018 >60  >60 mL/min Final   Anion gap 11/13/2018 8  5 - 15 Final   Performed at [facility], [place] [address] [place], [place] [address]   WBC 11/13/2018 6.1  4.0 - 10.5 K/uL Final   RBC 11/13/2018 4.91  3.87 - 5.11 MIL/uL Final   Hemoglobin 11/13/2018 14.1  12.0 - 15.0 g/dL Final   HCT 11/13/2018 42.9  36.0 - 46.0 % Final   MCV 11/13/2018 87.4  80.0 - 100.0 fL Final   MCH 11/13/2018 28.7  26.0 - 34.0 pg Final   MCHC 11/13/2018 32.9  30.0 - 36.0 g/dL Final   RDW 11/13/2018 12.6  11.5 - 15.5 % Final   Platelets 11/13/2018 281  150 - 400 K/uL Final   nRBC 11/13/2018 0.0  0.0 - 0.2 % Final   Performed at [facility], [place] [address], [place], [place] [address]    (this displays the last labs from the last 3 days)  No results found for: TOTALPROTELP, ALBUMINELP, A1GS, A2GS, BETS, BETA2SER, GAMS, MSPIKE, SPEI (this displays SPEP labs)  No results found for: KPAFRELGTCHN, LAMBDASER, KAPLAMBRATIO (kappa/lambda light chains)  No results found for: HGBA, HGBA2QUANT, HGBFQUANT, HGBSQUAN (Hemoglobinopathy evaluation)   No results found for: LDH  Lab Results  Component Value Date   IRON 46 05/11/2013   TIBC 254 05/11/2013   IRONPCTSAT 18 (L) 05/11/2013   (Iron and TIBC)  Lab Results  Component Value Date   FERRITIN 72 05/11/2013    Urinalysis    Component Value Date/Time   BILIRUBINUR Small 11/09/2018 0914   PROTEINUR Negative 11/09/2018 0914   UROBILINOGEN 0.2 11/09/2018 0914   NITRITE Negative 11/09/2018 0914    LEUKOCYTESUR Negative 11/09/2018 0914     STUDIES:  Ct Chest W Contrast  Result Date: 11/09/2018 CLINICAL DATA:  Breast cancer EXAM: CT CHEST WITH CONTRAST TECHNIQUE: Multidetector CT imaging of the chest was performed during intravenous contrast administration. CONTRAST:  60m OMNIPAQUE IOHEXOL 300 MG/ML  SOLN COMPARISON:  None. FINDINGS: Cardiovascular: The heart is normal in size. No pericardial effusion. No evidence of thoracic aortic aneurysm. Mild coronary atherosclerosis of the LAD. Mediastinum/Nodes: No suspicious mediastinal or hilar lymphadenopathy. 13 mm short axis right axillary node (series 2/image 35), suspicious for nodal metastasis. Additional 7 mm short axis right subpectoral node (series 2/image 27), suspicious given the additional findings. Visualized thyroid is notable for subcentimeter nodules bilaterally. Lungs/Pleura: No suspicious pulmonary nodules. No focal consolidation. No pleural effusions or pneumothorax. Upper Abdomen: Visualized upper abdomen is grossly unremarkable. Musculoskeletal: 2.9 x 2.9 cm soft tissue mass in the medial right breast (series 2/image 66), corresponding to the patient's known primary breast neoplasm. Mild degenerative changes of the visualized thoracic spine. IMPRESSION: 2.9 cm soft tissue mass in the medial right breast, corresponding to the patient's known primary breast neoplasm. Suspected right axillary and subpectoral nodal metastases, as above. Electronically Signed   By: SJulian HyM.D.   On: 11/09/2018 17:44   Ct Abdomen Pelvis W Contrast  Result Date: 11/10/2018 CLINICAL  DATA:  Right lower quadrant abdominal pain for 5 days. History of right breast cancer. EXAM: CT ABDOMEN AND PELVIS WITH CONTRAST TECHNIQUE: Multidetector CT imaging of the abdomen and pelvis was performed using the standard protocol following bolus administration of intravenous contrast. CONTRAST:  120m OMNIPAQUE IOHEXOL 300 MG/ML SOLN, 329mOMNIPAQUE IOHEXOL 300 MG/ML  SOLN COMPARISON:  None. FINDINGS: Lower chest: Small pericardial effusion with maximum diameter of 12 mm at the base of the heart. Hepatobiliary: 5 mm hypoattenuated nodule in the medial segment of the right lobe of the liver. 2-3 mm hypoattenuated nodule in the dome of the liver. Post cholecystectomy. No evidence of biliary ductal dilation. Pancreas: Unremarkable. No pancreatic ductal dilatation or surrounding inflammatory changes. Spleen: Normal in size without focal abnormality. Adrenals/Urinary Tract: Adrenal glands are unremarkable. Kidneys are normal, without renal calculi, focal lesion, or hydronephrosis. There is a masslike thickening of the anterior base of the urinary bladder measuring 2.9 x 2.6 cm, image 77/87, sequence 2. Stomach/Bowel: Stomach is within normal limits. No evidence of bowel wall small bowel thickening, distention, or inflammatory changes. No evidence of appendicitis. Mild nonspecific circumferential mucosal thickening of the sigmoid colon and rectum, nonspecific, possibly inflammatory. Vascular/Lymphatic: No significant vascular findings are present. No enlarged abdominal or pelvic lymph nodes. Reproductive: Status post hysterectomy. Mild prominence of the bilateral adnexa. Other: Small amount of free fluid in the pelvis. Musculoskeletal: No suspicious osseous lesions. Posterior facet arthropathy of the lower lumbosacral spine. IMPRESSION: 1. Small pericardial effusion. 2. Two less than 5 mm hypoattenuated liver nodules, too small to be accurately characterized by CT. 3. Masslike thickening of the anterior base of the urinary bladder. This may represent an area of of prominence of the proximal urethra, however bladder mass cannot be excluded. This finding may be further evaluated with MRI of the pelvis, or direct visualization. 4. Mild prominence of the bilateral adnexa. If MRI of the pelvis is not performed, evaluation with pelvic ultrasound may be considered. 5. Nonspecific, probably  inflammatory or infectious thickening of sigmoid colon and rectum. 6. Small amount of free fluid in the pelvis. Electronically Signed   By: DoFidela Salisbury.D.   On: 11/10/2018 18:28   Mr Breast Bilateral W Wo Contrast Inc Cad  Result Date: 11/10/2018 CLINICAL DATA:  [age]ear old female with biopsy proven malignancy of the right breast identified initially on screening mammography performed 10/21/2018. She had ultrasound-guided biopsy performed of the right breast mass and a right axillary lymph node on 10/28/2018 revealing grade 2 invasive ductal carcinoma in the right breast at 1 o'clock and tumor cells within fragmented nodal tissue in the right axilla. She has history of prior benign bilateral excisional biopsies. LABS:  Creatinine of 0.79 mg/dL and GFR greater than 60 on 11/04/2018. EXAM: BILATERAL BREAST MRI WITH AND WITHOUT CONTRAST TECHNIQUE: Multiplanar, multisequence MR images of both breasts were obtained prior to and following the intravenous administration of 8 ml of Gadavist. Three-dimensional MR images were rendered by post-processing of the original MR data on an independent workstation. The three-dimensional MR images were interpreted, and findings are reported in the following complete MRI report for this study. Three dimensional images were evaluated at the independent DynaCad workstation. COMPARISON:  No prior MRI available for comparison. Correlation made with prior mammograms and ultrasounds. FINDINGS: Breast composition: c. Heterogeneous fibroglandular tissue. Background parenchymal enhancement: Minimal. Right breast: In the central superior slightly medial right breast, there is a lobulated intensely enhancing mass measuring 2.8 x 2.5 x 2.1 cm. Susceptibility artifact is seen  in the anteromedial aspect of the mass compatible with the biopsy marking clip. There are 2 small nonenhancing to minimally enhancing masses, one in the upper-outer middle depth of the right breast, and another  in the lower outer posterior depth of the right breast. Both of these have been mammographically stable for several years and are benign. Left breast: No suspicious mass or abnormal enhancement. There are 3 nonenhancing to minimally enhancing masses in the central to central inferior left breast. These have been mammographically stable over several years and are benign. Lymph nodes: There is one enlarged right axillary lymph node. Susceptibility artifact from the biopsy marking clip is seen within the lymph node corresponding with the known lymph node with tumor cells. No abnormal appearing lymph nodes in the left axilla. Ancillary findings:  None. IMPRESSION: 1. There is a 2.8 cm biopsy proven malignant mass in the upper inner right breast. No evidence of additional sites of disease in the right breast. 2. There is a biopsy proven metastatic lymph node in the right axilla. No additional abnormal lymph nodes are identified. 3.  No evidence of left breast malignancy. RECOMMENDATION: Continue treatment plan for known right breast cancer. BI-RADS CATEGORY  6: Known biopsy-proven malignancy. Electronically Signed   By: Ammie Ferrier M.D.   On: 11/10/2018 11:57   US Breast Ltd Uni Right Inc Axilla  Result Date: 10/28/2018 CLINICAL DATA:  58 year old female recalled from screening mammogram dated 10/21/2018 for a right breast mass. The patient states she has developed a palpable lump in the superior right breast over the last 1-2 weeks. EXAM: ULTRASOUND OF THE RIGHT BREAST COMPARISON:  Previous exam(s). FINDINGS: On physical exam, I palpate a firm, fixed mass in the superior right breast. Targeted ultrasound is performed, showing an irregular hypoechoic mass at the 1 o'clock position 4 cm from the nipple. It measures 2.8 x 2.4 x 1.8 cm. There is associated peripheral and internal vascularity. An oval, circumscribed hypoechoic mass is also identified within the vicinity at the 1 o'clock position 4 cm from the  nipple. It measures 10 x 9 x 4 mm. This corresponds with an additional mammographically identified mass in the upper inner quadrant at middle depth. This is mammographically stable dating back to at least 2014. Additional stable, circumscribed masses are scattered throughout the bilateral breasts mammographically. Evaluation of the right axilla demonstrates a single, markedly abnormal lymph node with cortical thickening up to 1 cm. No additional suspicious lymphadenopathy is identified. IMPRESSION: 1. Highly suspicious right breast mass corresponding with the screening mammographic findings and the patient's palpable lump. Recommendation is for ultrasound-guided biopsy. 2. A single suspicious right axillary lymph node. Recommendation is for ultrasound-guided biopsy. 3. Additional benign circumscribed mass in the vicinity of the index lesion. This corresponds with a mammographically stable mass dating back to at least 2014. RECOMMENDATION: 1. Two area ultrasound-guided biopsy of the right breast and right axilla. 2. Given the patient's breast density, bilateral contrast-enhanced breast MRI is recommended pending biopsy results. I have discussed the findings and recommendations with the patient. Results were also provided in writing at the conclusion of the visit. If applicable, a reminder letter will be sent to the patient regarding the next appointment. BI-RADS CATEGORY  5: Highly suggestive of malignancy. Electronically Signed   By: Kristopher Oppenheim M.D.   On: 10/28/2018 13:11   Mm 3d Screen Breast Bilateral  Result Date: 10/23/2018 CLINICAL DATA:  Screening. EXAM: DIGITAL SCREENING BILATERAL MAMMOGRAM WITH TOMO AND CAD COMPARISON:  Previous exam(s). ACR  Breast Density Category b: There are scattered areas of fibroglandular density. FINDINGS: In the right breast, a possible mass warrants further evaluation. In the left breast, no findings suspicious for malignancy. Images were processed with CAD. IMPRESSION:  Further evaluation is suggested for possible mass in the right breast. RECOMMENDATION: Ultrasound of the right breast. (Code:[id number]-[id number]) The patient will be contacted regarding the findings, and additional imaging will be scheduled. BI-RADS CATEGORY  0: Incomplete. Need additional imaging evaluation and/or prior mammograms for comparison. Electronically Signed   By: Abelardo Diesel M.D.   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 TUMOR CELLS WITHIN FRAGMENTED NODAL TISSUE of the Right axilla. This was found to be concordant by Dr. Curlene Dolphin. Pathology results were discussed with the patient by telephone. The patient reported doing well after the biopsies with tenderness at the sites. Post biopsy instructions and care were reviewed and questions were answered. The patient was encouraged to call [facility] for any additional concerns. The patient was referred to [facility] on November 04, 2018. Given the patient's breast density, bilateral contrast-enhanced breast MRI is recommended. Pathology results reported by Terie Purser, RN on 10/30/2018. Electronically Signed   By: Curlene Dolphin M.D.   On: 10/30/2018 12:08   Result Date: 10/30/2018 CLINICAL DATA:  Ultrasound-guided core needle biopsy was recommended of a suspicious right axillary lymph node. The patient is also having a suspicious right breast mass biopsied today. EXAM: [place] AXILLARY NODE CORE BIOPSY RIGHT COMPARISON:  Previous exam(s). FINDINGS: I met with the patient and we discussed the procedure of ultrasound-guided biopsy, including benefits and alternatives. We discussed the high likelihood of a successful procedure. We discussed the risks of the procedure, including  infection, bleeding, tissue injury, clip migration, and inadequate sampling. Informed written consent was given. The usual time-out protocol was performed immediately prior to the procedure. Using sterile technique and 1% Lidocaine as local anesthetic, under direct ultrasound visualization, a 14 gauge spring-loaded device was used to perform biopsy of a right axillary lymph node using a lateral approach. At the conclusion of the procedure a HydroMARK tissue marker clip was deployed into the biopsy cavity. Follow up 2 view mammogram was performed and dictated separately. IMPRESSION: Ultrasound guided biopsy of a right axillary lymph node. No apparent complications. Electronically Signed: By: Curlene Dolphin M.D. On: 10/28/2018 16:01   US Thyroid  Result Date: 11/06/2018 CLINICAL DATA:  Thyroid nodule.  New diagnosis of breast cancer. EXAM: THYROID ULTRASOUND TECHNIQUE: Ultrasound examination of the thyroid gland and adjacent soft tissues was performed. COMPARISON:  None. FINDINGS: Parenchymal Echotexture: Mildly heterogenous Isthmus: 0.1 cm Right lobe: 3.8 x 1.7 x 1.6 cm Left lobe: 4.0 x 1.7 x 1.4 cm _________________________________________________________ Estimated total number of nodules >/= 1 cm: 2 Number of spongiform nodules >/=  2 cm not described below (TR1): 0 Number of mixed cystic and solid nodules >/= 1.5 cm not described below (TR2): 0 _________________________________________________________ Nodule # 1: Location: Right; Mid Maximum size: 0.8 cm; Other 2 dimensions: 0.5 cm. Cannot evaluate height due to the posterior acoustic shadowing. Composition: cannot determine (2) Echogenicity: very hypoechoic (3) Shape: not taller-than-wide (0) Margins: ill-defined (0) Echogenic foci: peripheral calcifications (2) ACR TI-RADS total points: 7. ACR TI-RADS risk category: TR4 (  4-6 points). ACR TI-RADS recommendations: *Given size (>/= 0.5 - 0.9 cm) and appearance, a follow-up ultrasound in 1 year should be  considered based on TI-RADS criteria. _________________________________________________________ Nodule # 2: Location: Right; Inferior Maximum size: 2.1 cm; Other 2 dimensions: 1.1 x 1.3 cm Composition: solid/almost completely solid (2) Echogenicity: isoechoic (1) Shape: not taller-than-wide (0) Margins: ill-defined (0) Echogenic foci: punctate echogenic foci (3) ACR TI-RADS total points: 6. ACR TI-RADS risk category: TR4 (4-6 points). ACR TI-RADS recommendations: **Given size (>/= 1.5 cm) and appearance, fine needle aspiration of this moderately suspicious nodule should be considered based on TI-RADS criteria. _________________________________________________________ Nodule # 4: Location: Left; Mid Maximum size: 0.7 cm; Other 2 dimensions: 0.6 x 0.4 cm Composition: solid/almost completely solid (2) Echogenicity: hyperechoic (1) Shape: taller-than-wide (3) Margins: ill-defined (0) Echogenic foci: none (0) ACR TI-RADS total points: 6. ACR TI-RADS risk category: TR4 (4-6 points). ACR TI-RADS recommendations: Given size (<0.9 cm) and appearance, this nodule does NOT meet TI-RADS criteria for biopsy or dedicated follow-up. _________________________________________________________ Nodule # 5: Location: Left; Inferior Maximum size: 1.1 cm; Other 2 dimensions: 0.7 x 0.7 cm Composition: solid/almost completely solid (2) Echogenicity: isoechoic (1) Shape: not taller-than-wide (0) Margins: ill-defined (0) Echogenic foci: none (0) ACR TI-RADS total points: 3. ACR TI-RADS risk category: TR3 (3 points). ACR TI-RADS recommendations: Given size (<1.4 cm) and appearance, this nodule does NOT meet TI-RADS criteria for biopsy or dedicated follow-up. _________________________________________________________ Nodule inferior to the right thyroid lobe measures 1.6 x 0.8 x 1.0 cm. Echogenicity of this nodule is similar to the thyroid. Question a few small echogenic foci within this nodular tissue. Morphology is not compatible with a normal  lymph node. IMPRESSION: 1. Thyroid nodules. 2. Nodule #2 in the inferior right thyroid lobe has suspicious characteristics and recommend ultrasound-guided fine needle aspiration of this nodule. 3. Indeterminate 1.6 cm soft tissue nodule or lymph node inferior to the right thyroid lobe. Based on the history of right breast cancer, recommend ultrasound-guided biopsy of this soft tissue nodule. 4. Nodule #1 in the mid right thyroid lobe meets criteria for 1 year follow-up. The above is in keeping with the ACR TI-RADS recommendations - J Am Coll Radiol 2017;14:587-595. Electronically Signed   By: Markus Daft M.D.   On: 11/06/2018 12:15   Mm Clip Placement Right  Result Date: 10/28/2018 CLINICAL DATA:  Ultrasound-guided biopsies of a right breast mass and of a suspicious right axillary lymph node performed today. EXAM: DIAGNOSTIC RIGHT MAMMOGRAM POST ULTRASOUND BIOPSIES COMPARISON:  Previous exam(s). FINDINGS: Mammographic images were obtained following ULTRASOUND guided biopsy of a right breast mass at 1 o'clock position. A ribbon shaped biopsy clip is satisfactorily positioned along the inferomedial aspect of the mass. A HydroMARK biopsy clip is satisfactorily positioned within an enlarged lymph node in the right axilla. IMPRESSION: Satisfactory position of ribbon shaped biopsy clip in the inferomedial portion of the right breast mass. Satisfactory position of HydroMARK biopsy clip in the right axillary lymph node. Final Assessment: Post Procedure Mammograms for Marker Placement Electronically Signed   By: Curlene Dolphin M.D.   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 TUMOR CELLS WITHIN FRAGMENTED NODAL TISSUE of the Right axilla. This was found to be concordant by Dr. Curlene Dolphin. Pathology results were  discussed with the  patient by telephone. The patient reported doing well after the biopsies with tenderness at the sites. Post biopsy instructions and care were reviewed and questions were answered. The patient was encouraged to call [facility] for any additional concerns. The patient was referred to [facility] on November 04, 2018. Given the patient's breast density, bilateral contrast-enhanced breast MRI is recommended. Pathology results reported by Terie Purser, RN on 10/30/2018. Electronically Signed   By: Curlene Dolphin M.D.   On: 10/30/2018 12:09   Result Date: 10/30/2018 CLINICAL DATA:  Ultrasound-guided core needle biopsy was recommended of a suspicious palpable mass in the upper inner right breast. EXAM: ULTRASOUND GUIDED RIGHT BREAST CORE NEEDLE BIOPSY COMPARISON:  Previous exam(s). FINDINGS: I met with the patient and we discussed the procedure of ultrasound-guided biopsy, including benefits and alternatives. We discussed the high likelihood of a successful procedure. We discussed the risks of the procedure, including infection, bleeding, tissue injury, clip migration, and inadequate sampling. Informed written consent was given. The usual time-out protocol was performed immediately prior to the procedure. Lesion quadrant: Upper inner quadrant Using sterile technique and 1% Lidocaine as local anesthetic, under direct ultrasound visualization, a 12 gauge spring-loaded device was used to perform biopsy of a palpable hypoechoic mass 1 o'clock position using a lateral approach. At the conclusion of the procedure a ribbon tissue marker clip was deployed into the biopsy cavity. Follow up 2 view mammogram was performed and dictated separately. IMPRESSION: Ultrasound guided biopsy of the right breast. No apparent complications. Electronically Signed: By: Curlene Dolphin M.D. On: 10/28/2018 16:00     ELIGIBLE  FOR AVAILABLE RESEARCH PROTOCOL: no   ASSESSMENT: 58 y.o. Debbie [name], Debbie [name] woman status post right breast upper inner quadrant biopsy 10/28/2018 for a clinical T2 N1, stage IIA invasive ductal carcinoma, grade 2, estrogen receptor positive, progesterone receptor negative, HER-2 amplified, with an MIB-1 of 20%  (a) staging work-up with CT scans of the chest abdomen and pelvis and thyroid ultrasonography shows multiple findings that will require follow-up:   (i) two 0.5 cm liver lesions   (ii) possible bladder mass, thickened sigmoid and rectum, and prominent adnexa   (iii) thyroid nodule and thyroid lymph nodes that will require biopsy  (1) neoadjuvant chemotherapy will consist of carboplatin, docetaxel, trastuzumab, and Pertuzumab every 21 days x 6 starting 11/17/2018  (2) trastuzumab will be continued to complete 1 year  (3) definitive surgery to follow  (4) adjuvant radiation as appropriate  (5) antiestrogens at the completion of local treatment   PLAN: Debbie [name] I think is a posterior case for the reason we do not usually obtain CT scans in early stage breast cancer.  She has multiple findings which hopefully will all be false positives, but which we will need to prove are false positives.  I have set her up for biopsy of her to thyroid findings of concern.  I have set her up for an MRI of the liver which hopefully will show the 2 small lesions to be cysts.  I have also set her up for an MRI of the pelvis and perhaps with the antibiotics she is currently receiving those problems will have resolved by the time the pelvic MRI is obtained  I reassured her to the best of my ability that we are likely dealing with false positives.  I do not believe she has stage IV breast cancer  Accordingly we are proceeding with treatment on  11/17/2018 as planned.  She will have a port placed 11/16/2018.  They can leave the needle in place so she can be treated the next day.  I reassured her that even if she  sleeps on her stomach the needle will not push through the back of the port.  I gave her a routing sheet on how to take all her supportive medications, which she has already obtained from the pharmacy.  We went over this in detail again today.  Finally she will see me again in approximately a week after the first cycle.  We will check her nadir counts and troubleshoot any unusual side effects.  Assuming all is well she will only be seen on day 1 with subsequent cycles  She knows to call for any other problems that may develop before her next visit.   Kaidance Pantoja, Virgie Dad, MD  11/13/18 1:49 PM Medical Oncology and Hematology [facility] [address] [place], [place] [address] Tel. [phone]    Fax. [phone]    I, Wilburn Mylar, am acting as scribe for Dr. Virgie Dad. Tonesha Tsou.  I, Lurline Del MD, have reviewed the above documentation for accuracy and completeness, and I agree with the above.

## 2018-11-13 ENCOUNTER — Encounter (HOSPITAL_COMMUNITY)
Admission: RE | Admit: 2018-11-13 | Discharge: 2018-11-13 | Disposition: A | Discharge status: Home / Self Care | Payer: BLUE CROSS/BLUE SHIELD | Source: Ambulatory Visit | Attending: Surgery | Admitting: Surgery

## 2018-11-13 ENCOUNTER — Inpatient Hospital Stay (HOSPITAL_BASED_OUTPATIENT_CLINIC_OR_DEPARTMENT_OTHER): Payer: BLUE CROSS/BLUE SHIELD | Admitting: Oncology

## 2018-11-13 ENCOUNTER — Encounter (HOSPITAL_COMMUNITY): Payer: Self-pay

## 2018-11-13 ENCOUNTER — Other Ambulatory Visit: Payer: Self-pay

## 2018-11-13 VITALS — BP 130/75 | HR 61 | Temp 98.8°F | Resp 18 | Ht 66.0 in | Wt 182.7 lb

## 2018-11-13 DIAGNOSIS — E042 Nontoxic multinodular goiter: Secondary | ICD-10-CM | POA: Diagnosis not present

## 2018-11-13 DIAGNOSIS — R599 Enlarged lymph nodes, unspecified: Secondary | ICD-10-CM

## 2018-11-13 DIAGNOSIS — Z5112 Encounter for antineoplastic immunotherapy: Secondary | ICD-10-CM | POA: Diagnosis not present

## 2018-11-13 DIAGNOSIS — E041 Nontoxic single thyroid nodule: Secondary | ICD-10-CM

## 2018-11-13 DIAGNOSIS — C773 Secondary and unspecified malignant neoplasm of axilla and upper limb lymph nodes: Secondary | ICD-10-CM | POA: Diagnosis not present

## 2018-11-13 DIAGNOSIS — Z170 Estrogen receptor positive status [ER+]: Secondary | ICD-10-CM

## 2018-11-13 DIAGNOSIS — Z01818 Encounter for other preprocedural examination: Secondary | ICD-10-CM | POA: Insufficient documentation

## 2018-11-13 DIAGNOSIS — Z5111 Encounter for antineoplastic chemotherapy: Secondary | ICD-10-CM | POA: Diagnosis not present

## 2018-11-13 DIAGNOSIS — K639 Disease of intestine, unspecified: Secondary | ICD-10-CM

## 2018-11-13 DIAGNOSIS — K769 Liver disease, unspecified: Secondary | ICD-10-CM

## 2018-11-13 DIAGNOSIS — D398 Neoplasm of uncertain behavior of other specified female genital organs: Secondary | ICD-10-CM | POA: Insufficient documentation

## 2018-11-13 DIAGNOSIS — N3289 Other specified disorders of bladder: Secondary | ICD-10-CM

## 2018-11-13 DIAGNOSIS — I10 Essential (primary) hypertension: Secondary | ICD-10-CM | POA: Diagnosis not present

## 2018-11-13 DIAGNOSIS — C50211 Malignant neoplasm of upper-inner quadrant of right female breast: Secondary | ICD-10-CM

## 2018-11-13 DIAGNOSIS — Z79899 Other long term (current) drug therapy: Secondary | ICD-10-CM

## 2018-11-13 DIAGNOSIS — R9431 Abnormal electrocardiogram [ECG] [EKG]: Secondary | ICD-10-CM | POA: Diagnosis not present

## 2018-11-13 HISTORY — DX: Disease of intestine, unspecified: K63.9

## 2018-11-13 HISTORY — DX: Neoplasm of uncertain behavior of other specified female genital organs: D39.8

## 2018-11-13 HISTORY — DX: Liver disease, unspecified: K76.9

## 2018-11-13 HISTORY — DX: Nausea with vomiting, unspecified: R11.2

## 2018-11-13 HISTORY — DX: Other specified postprocedural states: Z98.890

## 2018-11-13 HISTORY — DX: Nontoxic single thyroid nodule: E04.1

## 2018-11-13 HISTORY — DX: Other specified disorders of bladder: N32.89

## 2018-11-13 HISTORY — DX: Enlarged lymph nodes, unspecified: R59.9

## 2018-11-13 HISTORY — DX: Sleep apnea, unspecified: G47.30

## 2018-11-13 LAB — BASIC METABOLIC PANEL
Anion gap: 8 (ref 5–15)
BUN: 13 mg/dL (ref 6–20)
CO2: 29 mmol/L (ref 22–32)
Calcium: 9.5 mg/dL (ref 8.9–10.3)
Chloride: 106 mmol/L (ref 98–111)
Creatinine, Ser: 0.75 mg/dL (ref 0.44–1.00)
GFR calc Af Amer: >60 mL/min (ref >60)
GFR calc non Af Amer: >60 mL/min (ref >60)
Glucose, Bld: 101 mg/dL — ABNORMAL HIGH (ref 70–99)
Potassium: 3.4 mmol/L — ABNORMAL LOW (ref 3.5–5.1)
Sodium: 143 mmol/L (ref 135–145)

## 2018-11-13 LAB — CBC
HCT: 42.9 % (ref 36.0–46.0)
Hemoglobin: 14.1 g/dL (ref 12.0–15.0)
MCH: 28.7 pg (ref 26.0–34.0)
MCHC: 32.9 g/dL (ref 30.0–36.0)
MCV: 87.4 fL (ref 80.0–100.0)
Platelets: 281 10*3/uL (ref 150–400)
RBC: 4.91 MIL/uL (ref 3.87–5.11)
RDW: 12.6 % (ref 11.5–15.5)
WBC: 6.1 10*3/uL (ref 4.0–10.5)
nRBC: 0 % (ref 0.0–0.2)

## 2018-11-13 NOTE — Anesthesia Preprocedure Evaluation (Addendum)
Anesthesia Evaluation  Patient identified by MRN, date of birth, ID band Patient awake    Reviewed: Allergy & Precautions, NPO status , Patient's Chart, lab work & pertinent test results  History of Anesthesia Complications (+) PONV and history of anesthetic complications  Airway Mallampati: I       Dental no notable dental hx. (+) Teeth Intact   Pulmonary    Pulmonary exam normal        Cardiovascular hypertension, Pt. on medications Normal cardiovascular exam     Neuro/Psych PSYCHIATRIC DISORDERS Anxiety negative neurological ROS     GI/Hepatic Neg liver ROS, GERD  Medicated,  Endo/Other  negative endocrine ROS  Renal/GU negative Renal ROS     Musculoskeletal   Abdominal Normal abdominal exam  (+)   Peds  Hematology negative hematology ROS (+)   Anesthesia Other Findings Result status: Final result    ECHOCARDIOGRAM REPORT       Patient Name:   Debbie [name] Date of Exam: 11/11/2018 Medical Rec #:  [id number]          Height:       66.0 in Accession #:    [id number]         Weight:       184.0 lb Date of Birth:  June 26, 1961          BSA:          1.93 m Patient Age:    58 years           BP:           133/78 mmHg Patient Gender: F                  HR:           70 bpm. Exam Location:  Outpatient    Procedure: 2D Echo  Indications:    Chemo   History:        Patient has no prior history of Echocardiogram examinations.                 Breat cancer.   Sonographer:    Dustin Flock Referring Phys: [place]    1. The left ventricle has normal systolic function, with an ejection fraction of 55-60%. The cavity size was normal. There is mildly increased left ventricular wall thickness. Left ventricular diastolic function could not be evaluated due to  nondiagnostic images. No evidence of left ventricular regional wall motion abnormalities. GLS -16.4%.  2. The  right ventricle has normal systolic function. The cavity was normal. There is no increase in right ventricular wall thickness.  3. Left atrial size was mildly dilated.  4. No evidence of mitral valve stenosis. Trivial mitral regurgitation.  5. The aortic valve is tricuspid no stenosis of the aortic valve.  6. The aortic root and ascending aorta are normal in size and structure.  7. The inferior vena cava was dilated in size with >50% respiratory variability. PA systolic pressure 30 mmHg.  8. Trivial pericardial effusion is present.     Reproductive/Obstetrics                           Anesthesia Physical Anesthesia Plan  ASA: II  Anesthesia Plan: General   Post-op Pain Management:    Induction: Intravenous  PONV Risk Score and Plan: 4 or greater and Ondansetron, Dexamethasone, Midazolam and Scopolamine patch - Pre-op  Airway Management Planned: LMA  Additional Equipment:   Intra-op Plan:   Post-operative Plan: Extubation in OR  Informed Consent: I have reviewed the patients History and Physical, chart, labs and discussed the procedure including the risks, benefits and alternatives for the proposed anesthesia with the patient or authorized representative who has indicated his/her understanding and acceptance.     Dental advisory given  Plan Discussed with: CRNA  Anesthesia Plan Comments: (See PAT note 11/13/18, Konrad Felix, PA-C)      Anesthesia Quick Evaluation

## 2018-11-13 NOTE — Progress Notes (Signed)
Anesthesia Chart Review   Case:  [id number] Date/Time:  11/16/18 0715   Procedure:  INSERTION PORT-A-CATH WITH ULTRASOUND (N/A )   Anesthesia type:  General   Pre-op diagnosis:  right breast cancer   Location:  [place] [facility] / [facility]   Surgeon:  Alphonsa Overall, MD      DISCUSSION: 58 yo never smoker with h/o PONV, anxiety, asthma, HTN, sleep apnea, right breast cancer (pt to begin chemo 11/17/18, definitive surgery to follow) scheduled for above procedure 11/16/18 with Dr. Alphonsa Overall.   Pt can proceed with planned procedure barring acute status change.  VS: BP (!) 148/89   Pulse 71   Temp 36.8 C (Oral)   Resp 18   Ht 5\' 6"  (1.676 m)   Wt 82.4 kg   SpO2 100%   BMI 29.30 kg/m   PROVIDERS: Donella Stade, PA-C is PCP   Lurline Del, MD is Medical Oncologist  LABS: Labs reviewed: Acceptable for surgery. (all labs ordered are listed, but only abnormal results are displayed)  Labs Reviewed  BASIC METABOLIC PANEL - Abnormal; Notable for the following components:      Result Value   Potassium 3.4 (*)    Glucose, Bld 101 (*)    All other components within normal limits  CBC     IMAGES: CT Abdomen 11/10/18 IMPRESSION: 1. Small pericardial effusion. 2. Two less than 5 mm hypoattenuated liver nodules, too small to be accurately characterized by CT. 3. Masslike thickening of the anterior base of the urinary bladder. This may represent an area of of prominence of the proximal urethra, however bladder mass cannot be excluded. This finding may be further evaluated with MRI of the pelvis, or direct visualization. 4. Mild prominence of the bilateral adnexa. If MRI of the pelvis is not performed, evaluation with pelvic ultrasound may be considered. 5. Nonspecific, probably inflammatory or infectious thickening of sigmoid colon and rectum. 6. Small amount of free fluid in the pelvis.  EKG: 11/13/18 Rate 60 bpm Normal sinus rhythm  Left axis deviation   CV: Echo  11/11/2018 IMPRESSIONS    1. The left ventricle has normal systolic function, with an ejection fraction of 55-60%. The cavity size was normal. There is mildly increased left ventricular wall thickness. Left ventricular diastolic function could not be evaluated due to  nondiagnostic images. No evidence of left ventricular regional wall motion abnormalities. GLS -16.4%.  2. The right ventricle has normal systolic function. The cavity was normal. There is no increase in right ventricular wall thickness.  3. Left atrial size was mildly dilated.  4. No evidence of mitral valve stenosis. Trivial mitral regurgitation.  5. The aortic valve is tricuspid no stenosis of the aortic valve.  6. The aortic root and ascending aorta are normal in size and structure.  7. The inferior vena cava was dilated in size with >50% respiratory variability. PA systolic pressure 30 mmHg.  8. Trivial pericardial effusion is present. Past Medical History:  Diagnosis Date  . Anxiety   . Asthma    with severe URIs  . Bronchitis   . Essential hypertension, benign 12/01/2012  . Generalized anxiety disorder 12/01/2012  . Hypertension   . PONV (postoperative nausea and vomiting)   . Seasonal allergies   . Sleep apnea     Past Surgical History:  Procedure Laterality Date  . ABDOMINAL HYSTERECTOMY  2011   with vaginal sling  . ANKLE SURGERY    . BREAST CYST EXCISION Bilateral   .  BREAST EXCISIONAL BIOPSY Bilateral   . CHOLECYSTECTOMY  2012  . KNEE ARTHROSCOPY W/ MEDIAL COLLATERAL LIGAMENT (MCL) REPAIR Left 10-07-2012  . TONSILLECTOMY  1969    MEDICATIONS: . fexofenadine (ALLEGRA) 180 MG tablet  . rifaximin (XIFAXAN) 550 MG TABS tablet  . AMBULATORY NON FORMULARY MEDICATION  . calcium-vitamin D (OSCAL WITH D) 500-200 MG-UNIT tablet  . ciprofloxacin (CIPRO) 500 MG tablet  . dexamethasone (DECADRON) 4 MG tablet  . hydrochlorothiazide (HYDRODIURIL) 12.5 MG tablet  . lidocaine-prilocaine (EMLA) cream  .  Liraglutide -Weight Management (SAXENDA) 18 MG/3ML SOPN  . LORazepam (ATIVAN) 0.5 MG tablet  . metroNIDAZOLE (FLAGYL) 500 MG tablet  . montelukast (SINGULAIR) 10 MG tablet  . Multiple Vitamin (MULTIVITAMIN WITH MINERALS) TABS tablet  . pantoprazole (PROTONIX) 40 MG tablet  . PROAIR HFA 108 (90 Base) MCG/ACT inhaler  . Probiotic Product (VSL#3 PO)  . prochlorperazine (COMPAZINE) 10 MG tablet  . valACYclovir (VALTREX) 1000 MG tablet  . vitamin B-12 (CYANOCOBALAMIN) 1000 MCG tablet  . zolpidem (AMBIEN) 5 MG tablet   No current facility-administered medications for this encounter.     Maia Plan [facility] Pre-Surgical Testing [phone] 11/13/18 2:40 PM

## 2018-11-15 NOTE — H&P (Signed)
[place]  Location: [facility] Patient #: [id number] DOB: 07/30/1961 Undefined / Language: Debbie [name] / Race: White Female  History of Present Illness   The patient is a 58 year old female who presents with a complaint of breast cancer.  The PCP is [place] [facility], [place]  The patient was referred by Dr. Theodosia Blender  The pateint was at the Breast [facility] - Oncology is Drs. Magrinat and Kinard  She is accompanied by her husband Debbie [name], and daughter Debbie [name]. [Corona virus in community]  She has had fibrocystic problems with her breast since age 59. She has undergone biopsies of both breast in the past. She had 2 lumpectomies of the left breast and one lumpectomy of the right breast. These were in [place]. Most recently, she had a core biopsy in 2010 in [place]. She is unsure which breast this was. All past breast biopsies have been benign. She felt a mass in her right breast about 2 or 3 months ago. She felt this was something similar to her prior experience. Her last mammogram was about a year and a half ago.  Mammograms: [facility] - 10/28/2018 - 2.8 cm x 2.4 cm mass at 1 o'clock, single abnormal right axillary node Biopsy: Right breast biopsy, 1 o'clock on 10/28/2018 ([id number]) - IDC, grade 2-3, ER - 90%, PR - 0%, Ki67 - 20%, Her2Neu - POSITIVE, right axillary node - POSITIVE Family history of breast or ovarian cancer: On hormone therapy:  I discussed the options for breast cancer treatment with the patient. The patient is at the [facility], which includes medical oncology and radiation oncology. I discussed the surgical options of lumpectomy vs. mastectomy. If mastectomy, there is the possibility of reconstruction. I discussed the options of lymph node biopsy. The treatment plan depends on the pathologic staging of the tumor and the patient's personal wishes. The risks of surgery include, but are not  limited to, bleeding, infection, the need for further surgery, and nerve injury. The patient has been given literature on the treatment of breast cancer.  Power port placement - I discussed the indications and potential complications of the power port placement. The primary complications of the power port, include, but are not limited to, bleeding, infection, nerve injury, thrombosis, and pneumothorax.  She has also noted a thyroid nodule.  Plan: 1) MRI of breasts, 2) Note she has a second mass in the right breast that will probably need to be biopsied (it has been stable for 6 years) 3) [place] of thyroid, 4) Power port placement, 5) Neoadjuvant chemotx, 6) Surgery dependent on response to chemotx  Past Medical History: 1. HTN 2. Asthma -seasonal 3. She went to an ER in [place] in Dec 2019 for "chest pain". But this all proved to be negative. 4. cholecystectomy - 2012 5. Hysterectomy - 2011 - but did not remove ovaries. 6. She has also noted a thyroid nodule  Social History: She is accompanied by her husband Debbie [name], and daughter Debbie [name].  Works as Engineer, building services for [employer]  Past Surgical History Tawni Pummel, RN; 11/04/2018 7:33 AM) Foot Surgery  Right. Gallbladder Surgery - Laparoscopic  Tonsillectomy   Diagnostic Studies History Tawni Pummel, RN; 11/04/2018 7:33 AM) Colonoscopy  1-5 years ago Mammogram  within last year  Medication History Tawni Pummel, RN; 11/04/2018 7:33 AM) Medications Reconciled  Pregnancy / Birth History Tawni Pummel, RN; 11/04/2018 7:33 AM) Age at menarche  16 years. Age of menopause  35-50 Contraceptive  History  Oral contraceptives. Gravida  2 Maternal age  66-30 Para  2  Other Problems Tawni Pummel, RN; 11/04/2018 7:33 AM) Anxiety Disorder  Arthritis  Asthma  Back Pain  Cholelithiasis  Gastroesophageal Reflux Disease  High blood pressure     Review of Systems Sunday Spillers Ledford RN; 11/04/2018  7:33 AM) General Present- Night Sweats. Not Present- Appetite Loss, Chills, Fatigue, Fever, Weight Gain and Weight Loss. Skin Not Present- Change in Wart/Mole, Dryness, Hives, Jaundice, New Lesions, Non-Healing Wounds, Rash and Ulcer. HEENT Present- Seasonal Allergies and Wears glasses/contact lenses. Not Present- Earache, Hearing Loss, Hoarseness, Nose Bleed, Oral Ulcers, Ringing in the Ears, Sinus Pain, Sore Throat, Visual Disturbances and Yellow Eyes. Respiratory Present- Snoring. Not Present- Bloody sputum, Chronic Cough, Difficulty Breathing and Wheezing. Breast Present- Breast Mass and Breast Pain. Not Present- Nipple Discharge and Skin Changes. Cardiovascular Not Present- Chest Pain, Difficulty Breathing Lying Down, Leg Cramps, Palpitations, Rapid Heart Rate, Shortness of Breath and Swelling of Extremities. Musculoskeletal Present- Back Pain. Not Present- Joint Pain, Joint Stiffness, Muscle Pain, Muscle Weakness and Swelling of Extremities. Neurological Not Present- Decreased Memory, Fainting, Headaches, Numbness, Seizures, Tingling, Tremor, Trouble walking and Weakness. Psychiatric Present- Anxiety, Change in Sleep Pattern, Fearful and Frequent crying. Not Present- Bipolar and Depression. Endocrine Present- Hot flashes. Not Present- Cold Intolerance, Excessive Hunger, Hair Changes, Heat Intolerance and New Diabetes. Hematology Present- Easy Bruising. Not Present- Blood Thinners, Excessive bleeding, Gland problems, HIV and Persistent Infections.   Physical Exam  General: WN WF who is alert and generally healthy appearing. Skin: Inspection and palpation of the skin unremarkable.  Eyes: Conjunctivae white, pupils equal. Face, ears, nose, mouth, and throat: Face - normal. Normal ears and nose. Lips and teeth normal.  Neck: Supple. No mass. Trachea midline. No thyroid mass.  Lymph Nodes: No supraclavicular or cervical adenopathy. I do not feel an obvious right axillary  mass.  Lungs: Normal respiratory effort. Clear to auscultation and symmetric breath sounds. Cardiovascular: Regular rate and rythm. Normal auscultation of the heart. No murmur or rub.  Breasts: Right - Large, Scar at 9 o'clock Bruise in UIQ of right breast Left - Large, Scar at 9 o'clock and periareolar  Abdomen: Soft. No mass. Liver and spleen not palpable. No tenderness. No hernia. Normal bowel sounds. No abdominal scars. Rectal: Not done.  Musculoskeletal/extremities: Normal gait. Good strength and ROM in upper and lower extremities.   Neurologic: Grossly intact to motor and sensory function.   Psychiatric: Has normal mood and affect. Judgement and insight appear normal.   Assessment & Plan  1.  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  THYROID NODULE (E04.1)  Addendum Note(Emili Mcloughlin H. Xayne Brumbaugh MD; 11/11/2018 8:48 AM) Thyroid US showed two nodules that need biopsy  I discussed this with the patient CT scan of chest - only saw breast cancer/lymph nodes. CT abdomen showed two nodules (  5 mm), thickening anterior bladder (she's had a bladder sling), non specific thickening of sigmoid colon and rectum  3. HTN 4. Asthma -seasonal 5. She went to an ER in [place] in Dec 2019 for "chest pain". But this all proved to be negative.  Alphonsa Overall, MD, [facility] Pager: [phone] Office phone:   [phone]

## 2018-11-16 ENCOUNTER — Other Ambulatory Visit: Payer: Self-pay

## 2018-11-16 ENCOUNTER — Encounter (HOSPITAL_COMMUNITY)
Admission: RE | Disposition: A | Discharge status: Home / Self Care | Payer: Self-pay | Source: Home / Self Care | Attending: Surgery

## 2018-11-16 ENCOUNTER — Ambulatory Visit (HOSPITAL_COMMUNITY): Payer: BLUE CROSS/BLUE SHIELD

## 2018-11-16 ENCOUNTER — Ambulatory Visit (HOSPITAL_COMMUNITY)
Admission: RE | Admit: 2018-11-16 | Discharge: 2018-11-16 | Disposition: A | Discharge status: Home / Self Care | Payer: BLUE CROSS/BLUE SHIELD | Attending: Surgery | Admitting: Surgery

## 2018-11-16 ENCOUNTER — Encounter (HOSPITAL_COMMUNITY): Payer: Self-pay | Admitting: Emergency Medicine

## 2018-11-16 ENCOUNTER — Ambulatory Visit (HOSPITAL_COMMUNITY): Payer: BLUE CROSS/BLUE SHIELD | Admitting: Certified Registered"

## 2018-11-16 ENCOUNTER — Other Ambulatory Visit: Payer: BLUE CROSS/BLUE SHIELD

## 2018-11-16 DIAGNOSIS — J45998 Other asthma: Secondary | ICD-10-CM | POA: Diagnosis not present

## 2018-11-16 DIAGNOSIS — F419 Anxiety disorder, unspecified: Secondary | ICD-10-CM | POA: Insufficient documentation

## 2018-11-16 DIAGNOSIS — K219 Gastro-esophageal reflux disease without esophagitis: Secondary | ICD-10-CM | POA: Diagnosis not present

## 2018-11-16 DIAGNOSIS — C50211 Malignant neoplasm of upper-inner quadrant of right female breast: Secondary | ICD-10-CM | POA: Insufficient documentation

## 2018-11-16 DIAGNOSIS — E041 Nontoxic single thyroid nodule: Secondary | ICD-10-CM | POA: Insufficient documentation

## 2018-11-16 DIAGNOSIS — Z95828 Presence of other vascular implants and grafts: Secondary | ICD-10-CM

## 2018-11-16 DIAGNOSIS — C50911 Malignant neoplasm of unspecified site of right female breast: Secondary | ICD-10-CM | POA: Diagnosis not present

## 2018-11-16 DIAGNOSIS — Z452 Encounter for adjustment and management of vascular access device: Secondary | ICD-10-CM | POA: Diagnosis not present

## 2018-11-16 DIAGNOSIS — Z79899 Other long term (current) drug therapy: Secondary | ICD-10-CM | POA: Insufficient documentation

## 2018-11-16 DIAGNOSIS — I10 Essential (primary) hypertension: Secondary | ICD-10-CM | POA: Diagnosis not present

## 2018-11-16 HISTORY — PX: PORTACATH PLACEMENT: SHX2246

## 2018-11-16 SURGERY — INSERTION, TUNNELED CENTRAL VENOUS DEVICE, WITH PORT
Anesthesia: General

## 2018-11-16 MED ORDER — PROPOFOL 10 MG/ML IV BOLUS
INTRAVENOUS | Status: AC
  Filled 2018-11-16: qty 60

## 2018-11-16 MED ORDER — CHLORHEXIDINE GLUCONATE CLOTH 2 % EX PADS: 6.0000 | MEDICATED_PAD | Freq: Once | CUTANEOUS | Status: DC

## 2018-11-16 MED ORDER — LACTATED RINGERS IV SOLN
INTRAVENOUS | Status: DC | PRN
  Administered 2018-11-16: 07:00:00 via INTRAVENOUS

## 2018-11-16 MED ORDER — OXYCODONE HCL 5 MG/5ML PO SOLN: 5.0000 mg | Freq: Once | ORAL | Status: DC | PRN

## 2018-11-16 MED ORDER — MIDAZOLAM HCL 2 MG/2ML IJ SOLN
INTRAMUSCULAR | Status: DC | PRN
  Administered 2018-11-16: 2 mg via INTRAVENOUS

## 2018-11-16 MED ORDER — FENTANYL CITRATE (PF) 100 MCG/2ML IJ SOLN
INTRAMUSCULAR | Status: AC
  Filled 2018-11-16: qty 2

## 2018-11-16 MED ORDER — IBUPROFEN 100 MG/5ML PO SUSP
200.0000 mg | Freq: Four times a day (QID) | ORAL | Status: DC | PRN
  Filled 2018-11-16: qty 20

## 2018-11-16 MED ORDER — GABAPENTIN 300 MG PO CAPS
300.0000 mg | ORAL_CAPSULE | ORAL | Status: AC
  Administered 2018-11-16: 300 mg via ORAL
  Filled 2018-11-16: qty 1

## 2018-11-16 MED ORDER — ONDANSETRON HCL 4 MG/2ML IJ SOLN
INTRAMUSCULAR | Status: DC | PRN
  Administered 2018-11-16: 4 mg via INTRAVENOUS

## 2018-11-16 MED ORDER — PROMETHAZINE HCL 25 MG/ML IJ SOLN
INTRAMUSCULAR | Status: AC
  Filled 2018-11-16: qty 1

## 2018-11-16 MED ORDER — BUPIVACAINE-EPINEPHRINE (PF) 0.25% -1:200000 IJ SOLN
INTRAMUSCULAR | Status: AC
  Filled 2018-11-16: qty 30

## 2018-11-16 MED ORDER — PROPOFOL 10 MG/ML IV BOLUS
INTRAVENOUS | Status: DC | PRN
  Administered 2018-11-16: 150 mg via INTRAVENOUS

## 2018-11-16 MED ORDER — ONDANSETRON HCL 4 MG/2ML IJ SOLN
INTRAMUSCULAR | Status: AC
  Filled 2018-11-16: qty 2

## 2018-11-16 MED ORDER — LIDOCAINE 2% (20 MG/ML) 5 ML SYRINGE
INTRAMUSCULAR | Status: DC | PRN
  Administered 2018-11-16: 60 mg via INTRAVENOUS

## 2018-11-16 MED ORDER — BUPIVACAINE-EPINEPHRINE (PF) 0.25% -1:200000 IJ SOLN
INTRAMUSCULAR | Status: DC | PRN
  Administered 2018-11-16: 11 mL

## 2018-11-16 MED ORDER — ONDANSETRON HCL 4 MG/2ML IJ SOLN
4.0000 mg | Freq: Once | INTRAMUSCULAR | Status: AC | PRN
  Administered 2018-11-16: 4 mg via INTRAVENOUS

## 2018-11-16 MED ORDER — DEXAMETHASONE SODIUM PHOSPHATE 10 MG/ML IJ SOLN
INTRAMUSCULAR | Status: DC | PRN
  Administered 2018-11-16: 4 mg via INTRAVENOUS

## 2018-11-16 MED ORDER — DEXAMETHASONE SODIUM PHOSPHATE 10 MG/ML IJ SOLN
INTRAMUSCULAR | Status: AC
  Filled 2018-11-16: qty 1

## 2018-11-16 MED ORDER — MEPERIDINE HCL 50 MG/ML IJ SOLN: 6.2500 mg | INTRAMUSCULAR | Status: DC | PRN

## 2018-11-16 MED ORDER — PROMETHAZINE HCL 25 MG/ML IJ SOLN
6.2500 mg | INTRAMUSCULAR | Status: DC | PRN
  Administered 2018-11-16: 12.5 mg via INTRAVENOUS

## 2018-11-16 MED ORDER — ACETAMINOPHEN 500 MG PO TABS
1000.0000 mg | ORAL_TABLET | ORAL | Status: AC
  Administered 2018-11-16: 1000 mg via ORAL
  Filled 2018-11-16: qty 2

## 2018-11-16 MED ORDER — SODIUM CHLORIDE 0.9 % IV SOLN
Freq: Once | INTRAVENOUS | Status: AC
  Administered 2018-11-16: 2 mL
  Filled 2018-11-16: qty 1.2

## 2018-11-16 MED ORDER — SCOPOLAMINE 1 MG/3DAYS TD PT72
MEDICATED_PATCH | TRANSDERMAL | Status: AC
  Filled 2018-11-16: qty 1

## 2018-11-16 MED ORDER — FENTANYL CITRATE (PF) 100 MCG/2ML IJ SOLN: 25.0000 ug | INTRAMUSCULAR | Status: DC | PRN

## 2018-11-16 MED ORDER — HEPARIN SOD (PORK) LOCK FLUSH 100 UNIT/ML IV SOLN
INTRAVENOUS | Status: DC | PRN
  Administered 2018-11-16: 500 [IU]

## 2018-11-16 MED ORDER — MIDAZOLAM HCL 2 MG/2ML IJ SOLN
INTRAMUSCULAR | Status: AC
  Filled 2018-11-16: qty 2

## 2018-11-16 MED ORDER — IBUPROFEN 200 MG PO TABS: 200.0000 mg | ORAL_TABLET | Freq: Four times a day (QID) | ORAL | Status: DC | PRN

## 2018-11-16 MED ORDER — SODIUM CHLORIDE 0.9 % IR SOLN
Status: DC | PRN
  Administered 2018-11-16: 1000 mL

## 2018-11-16 MED ORDER — OXYCODONE HCL 5 MG PO TABS: 5.0000 mg | ORAL_TABLET | Freq: Once | ORAL | Status: DC | PRN

## 2018-11-16 MED ORDER — TRAMADOL HCL 50 MG PO TABS: 50.0000 mg | ORAL_TABLET | Freq: Four times a day (QID) | ORAL | 1 refills | Status: DC | PRN

## 2018-11-16 MED ORDER — HEPARIN SOD (PORK) LOCK FLUSH 100 UNIT/ML IV SOLN
INTRAVENOUS | Status: AC
  Filled 2018-11-16: qty 5

## 2018-11-16 MED ORDER — CEFAZOLIN SODIUM-DEXTROSE 2-4 GM/100ML-% IV SOLN
2.0000 g | INTRAVENOUS | Status: AC
  Administered 2018-11-16: 2 g via INTRAVENOUS
  Filled 2018-11-16: qty 100

## 2018-11-16 MED ORDER — SCOPOLAMINE 1 MG/3DAYS TD PT72
MEDICATED_PATCH | TRANSDERMAL | Status: DC | PRN
  Administered 2018-11-16: 1 via TRANSDERMAL

## 2018-11-16 MED ORDER — FENTANYL CITRATE (PF) 100 MCG/2ML IJ SOLN
INTRAMUSCULAR | Status: DC | PRN
  Administered 2018-11-16: 50 ug via INTRAVENOUS
  Administered 2018-11-16 (×2): 25 ug via INTRAVENOUS

## 2018-11-16 MED ORDER — LIDOCAINE 2% (20 MG/ML) 5 ML SYRINGE
INTRAMUSCULAR | Status: AC
  Filled 2018-11-16: qty 5

## 2018-11-16 SURGICAL SUPPLY — 39 items
ADH SKN CLS APL DERMABOND .7 (GAUZE/BANDAGES/DRESSINGS)
APL PRP STRL LF DISP 70% ISPRP (MISCELLANEOUS) ×1
APL SKNCLS STERI-STRIP NONHPOA (GAUZE/BANDAGES/DRESSINGS) ×1
BAG DECANTER FOR FLEXI CONT (MISCELLANEOUS) ×3 IMPLANT
BENZOIN TINCTURE PRP APPL 2/3 (GAUZE/BANDAGES/DRESSINGS) ×2 IMPLANT
BLADE SURG 15 STRL LF DISP TIS (BLADE) ×1 IMPLANT
BLADE SURG 15 STRL SS (BLADE) ×3
CHLORAPREP W/TINT 26 (MISCELLANEOUS) ×3 IMPLANT
CLOSURE WOUND 1/4X4 (GAUZE/BANDAGES/DRESSINGS) ×1
COVER PROBE U/S 5X48 (MISCELLANEOUS) IMPLANT
COVER SURGICAL LIGHT HANDLE (MISCELLANEOUS) ×3 IMPLANT
COVER WAND RF STERILE (DRAPES) IMPLANT
DECANTER SPIKE VIAL GLASS SM (MISCELLANEOUS) ×3 IMPLANT
DERMABOND ADVANCED (GAUZE/BANDAGES/DRESSINGS)
DERMABOND ADVANCED .7 DNX12 (GAUZE/BANDAGES/DRESSINGS) IMPLANT
DRAPE C-ARM 42X120 X-RAY (DRAPES) ×3 IMPLANT
DRAPE LAPAROSCOPIC ABDOMINAL (DRAPES) ×3 IMPLANT
ELECT PENCIL ROCKER SW 15FT (MISCELLANEOUS) ×3 IMPLANT
ELECT REM PT RETURN 15FT ADLT (MISCELLANEOUS) ×3 IMPLANT
GAUZE 4X4 16PLY RFD (DISPOSABLE) ×3 IMPLANT
GAUZE SPONGE 2X2 8PLY STRL LF (GAUZE/BANDAGES/DRESSINGS) IMPLANT
GAUZE SPONGE 4X4 12PLY STRL (GAUZE/BANDAGES/DRESSINGS) ×2 IMPLANT
GLOVE SURG SIGNA 7.5 PF LTX (GLOVE) ×3 IMPLANT
GOWN STRL REUS W/TWL XL LVL3 (GOWN DISPOSABLE) ×6 IMPLANT
KIT BASIN OR (CUSTOM PROCEDURE TRAY) ×3 IMPLANT
KIT PORT POWER 8FR ISP CVUE (Port) ×2 IMPLANT
KIT TURNOVER KIT A (KITS) IMPLANT
NDL HYPO 25X1 1.5 SAFETY (NEEDLE) ×1 IMPLANT
NEEDLE HYPO 25X1 1.5 SAFETY (NEEDLE) ×3 IMPLANT
PACK BASIC VI WITH GOWN DISP (CUSTOM PROCEDURE TRAY) ×3 IMPLANT
SPONGE GAUZE 2X2 STER 10/PKG (GAUZE/BANDAGES/DRESSINGS) ×2
STRIP CLOSURE SKIN 1/4X4 (GAUZE/BANDAGES/DRESSINGS) ×1 IMPLANT
SUT MNCRL AB 4-0 PS2 18 (SUTURE) ×3 IMPLANT
SUT VIC AB 3-0 SH 18 (SUTURE) ×5 IMPLANT
SYR 10ML ECCENTRIC (SYRINGE) ×3 IMPLANT
SYR 3ML LL SCALE MARK (SYRINGE) ×2 IMPLANT
SYR CONTROL 10ML LL (SYRINGE) ×3 IMPLANT
TOWEL OR 17X26 10 PK STRL BLUE (TOWEL DISPOSABLE) ×3 IMPLANT
TOWEL OR NON WOVEN STRL DISP B (DISPOSABLE) ×3 IMPLANT

## 2018-11-16 NOTE — Op Note (Signed)
11/16/2018  8:39 AM  PATIENT:  Debbie [name], 58 y.o., female MRN: [id number] DOB: 02-15-1961  PREOP DIAGNOSIS:  right breast cancer, anticipate chemotherapy  POSTOP DIAGNOSIS:   Right breast cancer, anticipate chemotherapy  PROCEDURE:   Procedure(s): INSERTION PORT-A-CATH, left subclavian vein  SURGEON:   Alphonsa Overall, M.D.  ANESTHESIA:   general  Anesthesiologist: Lyn Hollingshead, MD CRNA: Victoriano Lain, CRNA; Good, Rollene Fare, CRNA  General  EBL:  minimal  ml  COUNTS CORRECT:  YES  INDICATIONS FOR PROCEDURE:  Debbie [name] is a 58 y.o. (DOB: 30-Mar-1961) whtie female whose primary care physician is [place], Royetta Car, PA-C and comes for power port placement for the treatment of right breast cancer.  Dr. Jana Hakim is her treating oncologist.   The indications and risks of the surgery were explained to the patient.  The risks include, but are not limited to, infection, bleeding, pneumothorax, nerve injury, and thrombosis of the vein.  OPERATIVE NOTE:  The patient was taken to OR room #1 at [facility].  Anesthesia was provided by Anesthesiologist: Lyn Hollingshead, MD CRNA: Victoriano Lain, CRNA; Eben Burow, CRNA.  At the beginning of the operation, the patient was given 2 gm Ancef, had a roll placed under her back, and had the upper chest/neck prepped with Chloroprep and draped.   A time out was held and the surgery checklist reviewed.   The patient was placed in Trendelenburg position.  The left subclavian vein was accessed with a 16 gauge needle and a guide wire threaded through the needle into the vein.  The position of the wire was checked with fluoroscopy.   I then developed a pocket in the upper inner aspect of the left chest for the port reservoir.  I used the [employer] for venous access.  The reservoir was sewn in place with a 3-0 Vicryl suture.  The reservoir had been flushed with dilute (10 units/cc) heparin.   I then passed  the silastic tubing from the reservoir incision to the subclavian stick site and used the 8 French introducer to pass it into the vein.  The tip of the silastic catheter was position at the junction of the SVC and the right atrium under fluoroscopy.  The silastic catheter was then attached to the port with the bayonet device.     The entire port and tubing were checked with fluoroscopy.  I accessed the port with the right angle Huber needle to leave it accessed, and then the port was flushed with 4 cc of concentrated heparin (100 units/cc).   The wounds were then closed with 3-0 vicryl subcutaneous sutures and the skin closed with a 4-0 Monocryl suture.  The skin was painted with DermaBond.   The patient was transferred to the recovery room in good condition.  The sponge and needle count were correct at the end of the case.  A CXR is ordered for port placement and pending at the time of this note.  Alphonsa Overall, MD, [facility] Pager: [phone] Office phone:  [phone]

## 2018-11-16 NOTE — Transfer of Care (Signed)
Immediate Anesthesia Transfer of Care Note  Patient: Debbie [name]  Procedure(s) Performed: INSERTION PORT-A-CATH WITH ULTRASOUND (N/A )  Patient Location: PACU  Anesthesia Type:General  Level of Consciousness: awake, alert  and oriented  Airway & Oxygen Therapy: Patient Spontanous Breathing and Patient connected to face mask oxygen  Post-op Assessment: Report given to RN and Post -op Vital signs reviewed and stable  Post vital signs: Reviewed and stable  Last Vitals:  Vitals Value Taken Time  BP 164/107 11/16/2018  8:46 AM  Temp    Pulse 80 11/16/2018  8:49 AM  Resp    SpO2 98 % 11/16/2018  8:49 AM  Vitals shown include unvalidated device data.  Last Pain:  Vitals:   11/16/18 0555  TempSrc:   PainSc: 0-No pain      Patients Stated Pain Goal: 3 (60/02/98 4730)  Complications: No apparent anesthesia complications

## 2018-11-16 NOTE — Interval H&P Note (Signed)
History and Physical Interval Note:  11/16/2018 7:24 AM  [place]  has presented today for surgery, with the diagnosis of right breast cancer.  The various methods of treatment have been discussed with the patient and family.  Husband in waiting area.  After consideration of risks, benefits and other options for treatment, the patient has consented to  Procedure(s): INSERTION PORT-A-CATH WITH ULTRASOUND (N/A) as a surgical intervention.  The patient's history has been reviewed, patient examined, no change in status, stable for surgery.  I have reviewed the patient's chart and labs.  Questions were answered to the patient's satisfaction.     Shann Medal

## 2018-11-16 NOTE — Anesthesia Procedure Notes (Signed)
Procedure Name: LMA Insertion Date/Time: 11/16/2018 7:34 AM Performed by: Eben Burow, CRNA Pre-anesthesia Checklist: Patient identified, Emergency Drugs available, Suction available, Patient being monitored and Timeout performed Patient Re-evaluated:Patient Re-evaluated prior to induction Oxygen Delivery Method: Circle system utilized Preoxygenation: Pre-oxygenation with 100% oxygen Induction Type: IV induction Ventilation: Mask ventilation without difficulty LMA: LMA inserted LMA Size: 4.0 Tube secured with: Tape Dental Injury: Teeth and Oropharynx as per pre-operative assessment

## 2018-11-16 NOTE — Discharge Instructions (Signed)
[facility] [place] [facility] - DISCHARGE INSTRUCTIONS TO PATIENT  Activity:  Driving - May drive tomorrow, if doing well   Lifting - No lifting more than 15 pounds for 5 days, then no limit  Wound Care:   May shower in two days.  The steristrips will come off over 2 weeks.  Do not pull them off, they will come off on their own.  Diet:  As tolerated  Follow up appointment:  Call Dr. Pollie Friar office [facility]) at [phone] for an appointment in 2 months.  Medications and dosages:  Resume your home medications.  You have a prescription for:  Ultram  Call Dr. Lucia Gaskins or his office  [phone]) if you have:  Temperature greater than 100.4,  Severe uncontrolled pain,  Redness, tenderness, or signs of infection (pain, swelling, redness, odor or green/yellow discharge around the site),  Difficulty breathing, headache or visual disturbances,  Any other questions or concerns you may have after discharge.  In an emergency, call 911 or go to an Emergency Department at a nearby hospital.

## 2018-11-16 NOTE — Anesthesia Postprocedure Evaluation (Signed)
Anesthesia Post Note  Patient: Debbie [name]  Procedure(s) Performed: INSERTION PORT-A-CATH WITH ULTRASOUND (N/A )     Patient location during evaluation: PACU Anesthesia Type: General Level of consciousness: awake and sedated Pain management: pain level controlled Vital Signs Assessment: post-procedure vital signs reviewed and stable Respiratory status: spontaneous breathing Cardiovascular status: stable Postop Assessment: no headache Anesthetic complications: yes Anesthetic complication details: PONV   Last Vitals:  Vitals:   11/16/18 0915 11/16/18 0930  BP: (!) 146/77   Pulse: 62   Resp: 14 12  Temp:    SpO2: 98%     Last Pain:  Vitals:   11/16/18 0915  TempSrc:   PainSc: 0-No pain   Pain Goal: Patients Stated Pain Goal: 3 (11/16/18 0555)                 Huston Foley

## 2018-11-17 ENCOUNTER — Inpatient Hospital Stay: Payer: BLUE CROSS/BLUE SHIELD

## 2018-11-17 ENCOUNTER — Encounter (HOSPITAL_COMMUNITY): Payer: Self-pay | Admitting: Surgery

## 2018-11-17 ENCOUNTER — Other Ambulatory Visit: Payer: Self-pay

## 2018-11-17 VITALS — BP 130/88 | HR 65 | Temp 98.9°F | Resp 17

## 2018-11-17 DIAGNOSIS — C773 Secondary and unspecified malignant neoplasm of axilla and upper limb lymph nodes: Secondary | ICD-10-CM | POA: Diagnosis not present

## 2018-11-17 DIAGNOSIS — C50211 Malignant neoplasm of upper-inner quadrant of right female breast: Secondary | ICD-10-CM

## 2018-11-17 DIAGNOSIS — Z170 Estrogen receptor positive status [ER+]: Principal | ICD-10-CM

## 2018-11-17 DIAGNOSIS — Z5112 Encounter for antineoplastic immunotherapy: Secondary | ICD-10-CM | POA: Diagnosis not present

## 2018-11-17 DIAGNOSIS — I10 Essential (primary) hypertension: Secondary | ICD-10-CM | POA: Diagnosis not present

## 2018-11-17 DIAGNOSIS — E042 Nontoxic multinodular goiter: Secondary | ICD-10-CM | POA: Diagnosis not present

## 2018-11-17 DIAGNOSIS — Z5111 Encounter for antineoplastic chemotherapy: Secondary | ICD-10-CM | POA: Diagnosis not present

## 2018-11-17 DIAGNOSIS — Z79899 Other long term (current) drug therapy: Secondary | ICD-10-CM | POA: Diagnosis not present

## 2018-11-17 LAB — COMPREHENSIVE METABOLIC PANEL
ALT: 68 U/L — ABNORMAL HIGH (ref 0–44)
AST: 64 U/L — ABNORMAL HIGH (ref 15–41)
Albumin: 3.8 g/dL (ref 3.5–5.0)
Alkaline Phosphatase: 131 U/L — ABNORMAL HIGH (ref 38–126)
Anion gap: 9 (ref 5–15)
BUN: 14 mg/dL (ref 6–20)
CALCIUM: 9.1 mg/dL (ref 8.9–10.3)
CO2: 26 mmol/L (ref 22–32)
Chloride: 108 mmol/L (ref 98–111)
Creatinine, Ser: 0.75 mg/dL (ref 0.44–1.00)
GFR calc Af Amer: >60 mL/min (ref >60)
Glucose, Bld: 88 mg/dL (ref 70–99)
Potassium: 3.4 mmol/L — ABNORMAL LOW (ref 3.5–5.1)
Sodium: 143 mmol/L (ref 135–145)
Total Bilirubin: 0.5 mg/dL (ref 0.3–1.2)
Total Protein: 6.2 g/dL — ABNORMAL LOW (ref 6.5–8.1)

## 2018-11-17 LAB — CBC WITH DIFFERENTIAL/PLATELET
Abs Immature Granulocytes: 0.03 10*3/uL (ref 0.00–0.07)
Basophils Absolute: 0 10*3/uL (ref 0.0–0.1)
Basophils Relative: 0 %
Eosinophils Absolute: 0.2 10*3/uL (ref 0.0–0.5)
Eosinophils Relative: 2 %
HCT: 38.5 % (ref 36.0–46.0)
Hemoglobin: 12.6 g/dL (ref 12.0–15.0)
Immature Granulocytes: 0 %
Lymphocytes Relative: 16 %
Lymphs Abs: 1.7 10*3/uL (ref 0.7–4.0)
MCH: 28.6 pg (ref 26.0–34.0)
MCHC: 32.7 g/dL (ref 30.0–36.0)
MCV: 87.5 fL (ref 80.0–100.0)
Monocytes Absolute: 1 10*3/uL (ref 0.1–1.0)
Monocytes Relative: 9 %
Neutro Abs: 7.9 10*3/uL — ABNORMAL HIGH (ref 1.7–7.7)
Neutrophils Relative %: 73 %
Platelets: 262 10*3/uL (ref 150–400)
RBC: 4.4 MIL/uL (ref 3.87–5.11)
RDW: 12.6 % (ref 11.5–15.5)
WBC: 10.7 10*3/uL — ABNORMAL HIGH (ref 4.0–10.5)
nRBC: 0 % (ref 0.0–0.2)

## 2018-11-17 MED ORDER — HEPARIN SOD (PORK) LOCK FLUSH 100 UNIT/ML IV SOLN
500.0000 [IU] | Freq: Once | INTRAVENOUS | Status: AC | PRN
  Administered 2018-11-17: 500 [IU]
  Filled 2018-11-17: qty 5

## 2018-11-17 MED ORDER — DIPHENHYDRAMINE HCL 25 MG PO CAPS
25.0000 mg | ORAL_CAPSULE | Freq: Once | ORAL | Status: AC
  Administered 2018-11-17: 25 mg via ORAL

## 2018-11-17 MED ORDER — SODIUM CHLORIDE 0.9 % IV SOLN
75.0000 mg/m2 | Freq: Once | INTRAVENOUS | Status: AC
  Administered 2018-11-17: 150 mg via INTRAVENOUS
  Filled 2018-11-17: qty 15

## 2018-11-17 MED ORDER — SODIUM CHLORIDE 0.9% FLUSH
10.0000 mL | INTRAVENOUS | Status: DC | PRN
  Administered 2018-11-17: 10 mL
  Filled 2018-11-17: qty 10

## 2018-11-17 MED ORDER — PALONOSETRON HCL INJECTION 0.25 MG/5ML
INTRAVENOUS | Status: AC
  Filled 2018-11-17: qty 5

## 2018-11-17 MED ORDER — SODIUM CHLORIDE 0.9 % IV SOLN
420.0000 mg | Freq: Once | INTRAVENOUS | Status: AC
  Administered 2018-11-17: 420 mg via INTRAVENOUS
  Filled 2018-11-17: qty 14

## 2018-11-17 MED ORDER — DIPHENHYDRAMINE HCL 25 MG PO CAPS
ORAL_CAPSULE | ORAL | Status: AC
  Filled 2018-11-17: qty 1

## 2018-11-17 MED ORDER — PALONOSETRON HCL INJECTION 0.25 MG/5ML
0.2500 mg | Freq: Once | INTRAVENOUS | Status: AC
  Administered 2018-11-17: 0.25 mg via INTRAVENOUS

## 2018-11-17 MED ORDER — TRASTUZUMAB CHEMO 150 MG IV SOLR
8.0000 mg/kg | Freq: Once | INTRAVENOUS | Status: AC
  Administered 2018-11-17: 672 mg via INTRAVENOUS
  Filled 2018-11-17: qty 32

## 2018-11-17 MED ORDER — ACETAMINOPHEN 325 MG PO TABS
ORAL_TABLET | ORAL | Status: AC
  Filled 2018-11-17: qty 2

## 2018-11-17 MED ORDER — ACETAMINOPHEN 325 MG PO TABS
650.0000 mg | ORAL_TABLET | Freq: Once | ORAL | Status: AC
  Administered 2018-11-17: 650 mg via ORAL

## 2018-11-17 MED ORDER — SODIUM CHLORIDE 0.9 % IV SOLN
634.0000 mg | Freq: Once | INTRAVENOUS | Status: AC
  Administered 2018-11-17: 630 mg via INTRAVENOUS
  Filled 2018-11-17: qty 63

## 2018-11-17 MED ORDER — SODIUM CHLORIDE 0.9 % IV SOLN
Freq: Once | INTRAVENOUS | Status: AC
  Administered 2018-11-17: 14:00:00 via INTRAVENOUS
  Filled 2018-11-17: qty 5

## 2018-11-17 MED ORDER — SODIUM CHLORIDE 0.9 % IV SOLN
Freq: Once | INTRAVENOUS | Status: AC
  Administered 2018-11-17: 09:00:00 via INTRAVENOUS
  Filled 2018-11-17: qty 250

## 2018-11-17 MED ORDER — ACETAMINOPHEN 325 MG PO TABS
ORAL_TABLET | ORAL | Status: AC
  Filled 2018-11-17: qty 1

## 2018-11-17 NOTE — Patient Instructions (Signed)
Coronavirus (COVID-19) Are you at risk?  Are you at risk for the Coronavirus (COVID-19)?  To be considered HIGH RISK for Coronavirus (COVID-19), you have to meet the following criteria:  . Traveled to [place], [place], [place], [place] or [place]; or in the [place] to [place], [place], [place], or [place]; and have fever, cough, and shortness of breath within the last 2 weeks of travel OR . Been in close contact with a person diagnosed with COVID-19 within the last 2 weeks and have fever, cough, and shortness of breath . IF YOU DO NOT MEET THESE CRITERIA, YOU ARE CONSIDERED LOW RISK FOR COVID-19.  What to do if you are HIGH RISK for COVID-19?  . If you are having a medical emergency, call 911. . Seek medical care right away. Before you go to a doctor's office, urgent care or emergency department, call ahead and tell them about your recent travel, contact with someone diagnosed with COVID-19, and your symptoms. You should receive instructions from your physician's office regarding next steps of care.  . When you arrive at healthcare provider, tell the healthcare staff immediately you have returned from visiting [place], [place], [place], [place] or [place]; or traveled in the [place] to [place], [place], [place], or [place]; in the last two weeks or you have been in close contact with a person diagnosed with COVID-19 in the last 2 weeks.   . Tell the health care staff about your symptoms: fever, cough and shortness of breath. . After you have been seen by a medical provider, you will be either: o Tested for (COVID-19) and discharged home on quarantine except to seek medical care if symptoms worsen, and asked to  - Stay home and avoid contact with others until you get your results (4-5 days)  - Avoid travel on public transportation if possible (such as bus, train, or airplane) or o Sent to the Emergency Department by EMS for evaluation, COVID-19 testing, and possible  admission depending on your condition and test results.  What to do if you are LOW RISK for COVID-19?  Reduce your risk of any infection by using the same precautions used for avoiding the common cold or flu:  . Wash your hands often with soap and warm water for at least 20 seconds.  If soap and water are not readily available, use an alcohol-based hand sanitizer with at least 60% alcohol.  . If coughing or sneezing, cover your mouth and nose by coughing or sneezing into the elbow areas of your shirt or coat, into a tissue or into your sleeve (not your hands). . Avoid shaking hands with others and consider head nods or verbal greetings only. . Avoid touching your eyes, nose, or mouth with unwashed hands.  . Avoid close contact with people who are sick. . Avoid places or events with large numbers of people in one location, like concerts or sporting events. . Carefully consider travel plans you have or are making. . If you are planning any travel outside or inside the [place], visit the CDC's Travelers' Health webpage for the latest health notices. . If you have some symptoms but not all symptoms, continue to monitor at home and seek medical attention if your symptoms worsen. . If you are having a medical emergency, call 911.   ADDITIONAL HEALTHCARE OPTIONS FOR PATIENTS  [facility] / e-Visit: [email or web address]         [facility]: [phone]  [facility]: [facility]: [phone]    Carboplatin injection What is this medicine? CARBOPLATIN (KAR boe pla tin) is a chemotherapy drug. It targets fast dividing cells, like cancer cells, and causes these cells to die. This medicine is used to treat ovarian cancer and many other cancers. This medicine may be used for other purposes; ask your health care provider or pharmacist if you have questions. COMMON BRAND NAME(S):  Paraplatin What should I tell my health care provider before I take this medicine? They need to know if you have any of these conditions: -blood disorders -hearing problems -kidney disease -recent or ongoing radiation therapy -an unusual or allergic reaction to carboplatin, cisplatin, other chemotherapy, other medicines, foods, dyes, or preservatives -pregnant or trying to get pregnant -breast-feeding How should I use this medicine? This drug is usually given as an infusion into a vein. It is administered in a hospital or clinic by a specially trained health care professional. Talk to your pediatrician regarding the use of this medicine in children. Special care may be needed. Overdosage: If you think you have taken too much of this medicine contact a poison control center or emergency room at once. NOTE: This medicine is only for you. Do not share this medicine with others. What if I miss a dose? It is important not to miss a dose. Call your doctor or health care professional if you are unable to keep an appointment. What may interact with this medicine? -medicines for seizures -medicines to increase blood counts like filgrastim, pegfilgrastim, sargramostim -some antibiotics like amikacin, gentamicin, neomycin, streptomycin, tobramycin -vaccines Talk to your doctor or health care professional before taking any of these medicines: -acetaminophen -aspirin -ibuprofen -ketoprofen -naproxen This list may not describe all possible interactions. Give your health care provider a list of all the medicines, herbs, non-prescription drugs, or dietary supplements you use. Also tell them if you smoke, drink alcohol, or use illegal drugs. Some items may interact with your medicine. What should I watch for while using this medicine? Your condition will be monitored carefully while you are receiving this medicine. You will need important blood work done while you are taking this medicine. This drug  may make you feel generally unwell. This is not uncommon, as chemotherapy can affect healthy cells as well as cancer cells. Report any side effects. Continue your course of treatment even though you feel ill unless your doctor tells you to stop. In some cases, you may be given additional medicines to help with side effects. Follow all directions for their use. Call your doctor or health care professional for advice if you get a fever, chills or sore throat, or other symptoms of a cold or flu. Do not treat yourself. This drug decreases your body's ability to fight infections. Try to avoid being around people who are sick. This medicine may increase your risk to bruise or bleed. Call your doctor or health care professional if you notice any unusual bleeding. Be careful brushing and flossing your teeth or using a toothpick because you may get an infection or bleed more easily. If you have any dental work done, tell your dentist you are receiving this medicine. Avoid taking products that contain aspirin, acetaminophen, ibuprofen, naproxen, or ketoprofen unless instructed by your doctor. These medicines may hide a fever. Do not become pregnant while taking this medicine. Women should inform their doctor if  they wish to become pregnant or think they might be pregnant. There is a potential for serious side effects to an unborn child. Talk to your health care professional or pharmacist for more information. Do not breast-feed an infant while taking this medicine. What side effects may I notice from receiving this medicine? Side effects that you should report to your doctor or health care professional as soon as possible: -allergic reactions like skin rash, itching or hives, swelling of the face, lips, or tongue -signs of infection - fever or chills, cough, sore throat, pain or difficulty passing urine -signs of decreased platelets or bleeding - bruising, pinpoint red spots on the skin, black, tarry stools,  nosebleeds -signs of decreased red blood cells - unusually weak or tired, fainting spells, lightheadedness -breathing problems -changes in hearing -changes in vision -chest pain -high blood pressure -low blood counts - This drug may decrease the number of white blood cells, red blood cells and platelets. You may be at increased risk for infections and bleeding. -nausea and vomiting -pain, swelling, redness or irritation at the injection site -pain, tingling, numbness in the hands or feet -problems with balance, talking, walking -trouble passing urine or change in the amount of urine Side effects that usually do not require medical attention (report to your doctor or health care professional if they continue or are bothersome): -hair loss -loss of appetite -metallic taste in the mouth or changes in taste This list may not describe all possible side effects. Call your doctor for medical advice about side effects. You may report side effects to FDA at 1-800-FDA-1088. Where should I keep my medicine? This drug is given in a hospital or clinic and will not be stored at home. NOTE: This sheet is a summary. It may not cover all possible information. If you have questions about this medicine, talk to your doctor, pharmacist, or health care provider.  2019 Elsevier/Gold Standard (2007-11-10 14:38:05)   Docetaxel injection(taxotere) What is this medicine? DOCETAXEL (doe se TAX el) is a chemotherapy drug. It targets fast dividing cells, like cancer cells, and causes these cells to die. This medicine is used to treat many types of cancers like breast cancer, certain stomach cancers, head and neck cancer, lung cancer, and prostate cancer. This medicine may be used for other purposes; ask your health care provider or pharmacist if you have questions. COMMON BRAND NAME(S): Docefrez, Taxotere What should I tell my health care provider before I take this medicine? They need to know if you have any of  these conditions: -infection (especially a virus infection such as chickenpox, cold sores, or herpes) -liver disease -low blood counts, like low white cell, platelet, or red cell counts -an unusual or allergic reaction to docetaxel, polysorbate 80, other chemotherapy agents, other medicines, foods, dyes, or preservatives -pregnant or trying to get pregnant -breast-feeding How should I use this medicine? This drug is given as an infusion into a vein. It is administered in a hospital or clinic by a specially trained health care professional. Talk to your pediatrician regarding the use of this medicine in children. Special care may be needed. Overdosage: If you think you have taken too much of this medicine contact a poison control center or emergency room at once. NOTE: This medicine is only for you. Do not share this medicine with others. What if I miss a dose? It is important not to miss your dose. Call your doctor or health care professional if you are unable to keep an appointment.  What may interact with this medicine? -cyclosporine -erythromycin -ketoconazole -medicines to increase blood counts like filgrastim, pegfilgrastim, sargramostim -vaccines Talk to your doctor or health care professional before taking any of these medicines: -acetaminophen -aspirin -ibuprofen -ketoprofen -naproxen This list may not describe all possible interactions. Give your health care provider a list of all the medicines, herbs, non-prescription drugs, or dietary supplements you use. Also tell them if you smoke, drink alcohol, or use illegal drugs. Some items may interact with your medicine. What should I watch for while using this medicine? Your condition will be monitored carefully while you are receiving this medicine. You will need important blood work done while you are taking this medicine. This drug may make you feel generally unwell. This is not uncommon, as chemotherapy can affect healthy cells as  well as cancer cells. Report any side effects. Continue your course of treatment even though you feel ill unless your doctor tells you to stop. In some cases, you may be given additional medicines to help with side effects. Follow all directions for their use. Call your doctor or health care professional for advice if you get a fever, chills or sore throat, or other symptoms of a cold or flu. Do not treat yourself. This drug decreases your body's ability to fight infections. Try to avoid being around people who are sick. This medicine may increase your risk to bruise or bleed. Call your doctor or health care professional if you notice any unusual bleeding. This medicine may contain alcohol in the product. You may get drowsy or dizzy. Do not drive, use machinery, or do anything that needs mental alertness until you know how this medicine affects you. Do not stand or sit up quickly, especially if you are an older patient. This reduces the risk of dizzy or fainting spells. Avoid alcoholic drinks. Do not become pregnant while taking this medicine or for 6 months after stopping it. Women should inform their doctor if they wish to become pregnant or think they might be pregnant. Men should not father a child while taking this medicine and for 3 months after stopping it. There is a potential for serious side effects to an unborn child. Talk to your health care professional or pharmacist for more information. Do not breast-feed an infant while taking this medicine or for 2 weeks after stopping it. This may interfere with the ability to father a child. You should talk to your doctor or health care professional if you are concerned about your fertility. What side effects may I notice from receiving this medicine? Side effects that you should report to your doctor or health care professional as soon as possible: -allergic reactions like skin rash, itching or hives, swelling of the face, lips, or tongue -low blood  counts - This drug may decrease the number of white blood cells, red blood cells and platelets. You may be at increased risk for infections and bleeding. -signs of infection - fever or chills, cough, sore throat, pain or difficulty passing urine -signs of decreased platelets or bleeding - bruising, pinpoint red spots on the skin, black, tarry stools, nosebleeds -signs of decreased red blood cells - unusually weak or tired, fainting spells, lightheadedness -breathing problems -fast or irregular heartbeat -low blood pressure -mouth sores -nausea and vomiting -pain, swelling, redness or irritation at the injection site -pain, tingling, numbness in the hands or feet -swelling of the ankle, feet, hands -weight gain Side effects that usually do not require medical attention (report to your doctor  or health care professional if they continue or are bothersome): -bone pain -complete hair loss including hair on your head, underarms, pubic hair, eyebrows, and eyelashes -diarrhea -excessive tearing -changes in the color of fingernails -loosening of the fingernails -nausea -muscle pain -red flush to skin -sweating -weak or tired This list may not describe all possible side effects. Call your doctor for medical advice about side effects. You may report side effects to FDA at 1-800-FDA-1088. Where should I keep my medicine? This drug is given in a hospital or clinic and will not be stored at home. NOTE: This sheet is a summary. It may not cover all possible information. If you have questions about this medicine, talk to your doctor, pharmacist, or health care provider.  2019 Elsevier/Gold Standard (2017-09-01 12:07:21)   Pertuzumab injection(perjeta) What is this medicine? PERTUZUMAB (per TOOZ ue mab) is a monoclonal antibody. It is used to treat breast cancer. This medicine may be used for other purposes; ask your health care provider or pharmacist if you have questions. COMMON BRAND NAME(S):  PERJETA What should I tell my health care provider before I take this medicine? They need to know if you have any of these conditions: -heart disease -heart failure -high blood pressure -history of irregular heart beat -recent or ongoing radiation therapy -an unusual or allergic reaction to pertuzumab, other medicines, foods, dyes, or preservatives -pregnant or trying to get pregnant -breast-feeding How should I use this medicine? This medicine is for infusion into a vein. It is given by a health care professional in a hospital or clinic setting. Talk to your pediatrician regarding the use of this medicine in children. Special care may be needed. Overdosage: If you think you have taken too much of this medicine contact a poison control center or emergency room at once. NOTE: This medicine is only for you. Do not share this medicine with others. What if I miss a dose? It is important not to miss your dose. Call your doctor or health care professional if you are unable to keep an appointment. What may interact with this medicine? Interactions are not expected. Give your health care provider a list of all the medicines, herbs, non-prescription drugs, or dietary supplements you use. Also tell them if you smoke, drink alcohol, or use illegal drugs. Some items may interact with your medicine. This list may not describe all possible interactions. Give your health care provider a list of all the medicines, herbs, non-prescription drugs, or dietary supplements you use. Also tell them if you smoke, drink alcohol, or use illegal drugs. Some items may interact with your medicine. What should I watch for while using this medicine? Your condition will be monitored carefully while you are receiving this medicine. Report any side effects. Continue your course of treatment even though you feel ill unless your doctor tells you to stop. Do not become pregnant while taking this medicine or for 7 months after  stopping it. Women should inform their doctor if they wish to become pregnant or think they might be pregnant. Women of child-bearing potential will need to have a negative pregnancy test before starting this medicine. There is a potential for serious side effects to an unborn child. Talk to your health care professional or pharmacist for more information. Do not breast-feed an infant while taking this medicine or for 7 months after stopping it. Women must use effective birth control with this medicine. Call your doctor or health care professional for advice if you get a fever,  chills or sore throat, or other symptoms of a cold or flu. Do not treat yourself. Try to avoid being around people who are sick. You may experience fever, chills, and headache during the infusion. Report any side effects during the infusion to your health care professional. What side effects may I notice from receiving this medicine? Side effects that you should report to your doctor or health care professional as soon as possible: -breathing problems -chest pain or palpitations -dizziness -feeling faint or lightheaded -fever or chills -skin rash, itching or hives -sore throat -swelling of the face, lips, or tongue -swelling of the legs or ankles -unusually weak or tired Side effects that usually do not require medical attention (report to your doctor or health care professional if they continue or are bothersome): -diarrhea -hair loss -nausea, vomiting -tiredness This list may not describe all possible side effects. Call your doctor for medical advice about side effects. You may report side effects to FDA at 1-800-FDA-1088. Where should I keep my medicine? This drug is given in a hospital or clinic and will not be stored at home. NOTE: This sheet is a summary. It may not cover all possible information. If you have questions about this medicine, talk to your doctor, pharmacist, or health care provider.  2019  Elsevier/Gold Standard (2015-09-07 12:08:50)    Trastuzumab injection for infusion(herceptin) What is this medicine? TRASTUZUMAB (tras TOO zoo mab) is a monoclonal antibody. It is used to treat breast cancer and stomach cancer. This medicine may be used for other purposes; ask your health care provider or pharmacist if you have questions. COMMON BRAND NAME(S): Herceptin, Calla Kicks, OGIVRI What should I tell my health care provider before I take this medicine? They need to know if you have any of these conditions: -heart disease -heart failure -lung or breathing disease, like asthma -an unusual or allergic reaction to trastuzumab, benzyl alcohol, or other medications, foods, dyes, or preservatives -pregnant or trying to get pregnant -breast-feeding How should I use this medicine? This drug is given as an infusion into a vein. It is administered in a hospital or clinic by a specially trained health care professional. Talk to your pediatrician regarding the use of this medicine in children. This medicine is not approved for use in children. Overdosage: If you think you have taken too much of this medicine contact a poison control center or emergency room at once. NOTE: This medicine is only for you. Do not share this medicine with others. What if I miss a dose? It is important not to miss a dose. Call your doctor or health care professional if you are unable to keep an appointment. What may interact with this medicine? This medicine may interact with the following medications: -certain types of chemotherapy, such as daunorubicin, doxorubicin, epirubicin, and idarubicin This list may not describe all possible interactions. Give your health care provider a list of all the medicines, herbs, non-prescription drugs, or dietary supplements you use. Also tell them if you smoke, drink alcohol, or use illegal drugs. Some items may interact with your medicine. What should I watch for while using this  medicine? Visit your doctor for checks on your progress. Report any side effects. Continue your course of treatment even though you feel ill unless your doctor tells you to stop. Call your doctor or health care professional for advice if you get a fever, chills or sore throat, or other symptoms of a cold or flu. Do not treat yourself. Try to avoid being around  people who are sick. You may experience fever, chills and shaking during your first infusion. These effects are usually mild and can be treated with other medicines. Report any side effects during the infusion to your health care professional. Fever and chills usually do not happen with later infusions. Do not become pregnant while taking this medicine or for 7 months after stopping it. Women should inform their doctor if they wish to become pregnant or think they might be pregnant. Women of child-bearing potential will need to have a negative pregnancy test before starting this medicine. There is a potential for serious side effects to an unborn child. Talk to your health care professional or pharmacist for more information. Do not breast-feed an infant while taking this medicine or for 7 months after stopping it. Women must use effective birth control with this medicine. What side effects may I notice from receiving this medicine? Side effects that you should report to your doctor or health care professional as soon as possible: -allergic reactions like skin rash, itching or hives, swelling of the face, lips, or tongue -chest pain or palpitations -cough -dizziness -feeling faint or lightheaded, falls -fever -general ill feeling or flu-like symptoms -signs of worsening heart failure like breathing problems; swelling in your legs and feet -unusually weak or tired Side effects that usually do not require medical attention (report to your doctor or health care professional if they continue or are bothersome): -bone pain -changes in  taste -diarrhea -joint pain -nausea/vomiting -weight loss This list may not describe all possible side effects. Call your doctor for medical advice about side effects. You may report side effects to FDA at 1-800-FDA-1088. Where should I keep my medicine? This drug is given in a hospital or clinic and will not be stored at home. NOTE: This sheet is a summary. It may not cover all possible information. If you have questions about this medicine, talk to your doctor, pharmacist, or health care provider.  2019 Elsevier/Gold Standard (2016-07-30 14:37:52) [facility] Discharge Instructions for Patients Receiving Chemotherapy  Today you received the following chemotherapy agents as prescribed  To help prevent nausea and vomiting after your treatment, we encourage you to take your nausea medication as prescribed  If you develop nausea and vomiting that is not controlled by your nausea medication, call the clinic.   BEL[facility] ARE SYMPTOMS THAT SHOULD BE REPORTED IMMEDIATELY:  *FEVER GREATER THAN 100.5 F  *CHILLS WITH OR WITHOUT FEVER  NAUSEA AND VOMITING THAT IS NOT CONTROLLED WITH YOUR NAUSEA MEDICATION  *UNUSUAL SHORTNESS OF BREATH  *UNUSUAL BRUISING OR BLEEDING  TENDERNESS IN MOUTH AND THROAT WITH OR WITHOUT PRESENCE OF ULCERS  *URINARY PROBLEMS  *BOWEL PROBLEMS  UNUSUAL RASH Items with * indicate a potential emergency and should be followed up as soon as possible.  Feel free to call the clinic should you have any questions or concerns. The clinic phone number is [phone].  Please show the [place] at check-in to the Emergency Department and triage nurse.  Pegfilgrastim injection(udenyca) What is this medicine? PEGFILGRASTIM (PEG fil gra stim) is a long-acting granulocyte colony-stimulating factor that stimulates the growth of neutrophils, a type of white blood cell important in the body's fight against infection. It is used to reduce the incidence of  fever and infection in patients with certain types of cancer who are receiving chemotherapy that affects the bone marrow, and to increase survival after being exposed to high doses of radiation. This medicine may be used  for other purposes; ask your health care provider or pharmacist if you have questions. COMMON BRAND NAME(S): Domenic Moras, UDENYCA What should I tell my health care provider before I take this medicine? They need to know if you have any of these conditions: -kidney disease -latex allergy -ongoing radiation therapy -sickle cell disease -skin reactions to acrylic adhesives (On-Body Injector only) -an unusual or allergic reaction to pegfilgrastim, filgrastim, other medicines, foods, dyes, or preservatives -pregnant or trying to get pregnant -breast-feeding How should I use this medicine? This medicine is for injection under the skin. If you get this medicine at home, you will be taught how to prepare and give the pre-filled syringe or how to use the On-body Injector. Refer to the patient Instructions for Use for detailed instructions. Use exactly as directed. Tell your healthcare provider immediately if you suspect that the On-body Injector may not have performed as intended or if you suspect the use of the On-body Injector resulted in a missed or partial dose. It is important that you put your used needles and syringes in a special sharps container. Do not put them in a trash can. If you do not have a sharps container, call your pharmacist or healthcare provider to get one. Talk to your pediatrician regarding the use of this medicine in children. While this drug may be prescribed for selected conditions, precautions do apply. Overdosage: If you think you have taken too much of this medicine contact a poison control center or emergency room at once. NOTE: This medicine is only for you. Do not share this medicine with others. What if I miss a dose? It is important not to miss  your dose. Call your doctor or health care professional if you miss your dose. If you miss a dose due to an On-body Injector failure or leakage, a new dose should be administered as soon as possible using a single prefilled syringe for manual use. What may interact with this medicine? Interactions have not been studied. Give your health care provider a list of all the medicines, herbs, non-prescription drugs, or dietary supplements you use. Also tell them if you smoke, drink alcohol, or use illegal drugs. Some items may interact with your medicine. This list may not describe all possible interactions. Give your health care provider a list of all the medicines, herbs, non-prescription drugs, or dietary supplements you use. Also tell them if you smoke, drink alcohol, or use illegal drugs. Some items may interact with your medicine. What should I watch for while using this medicine? You may need blood work done while you are taking this medicine. If you are going to need a MRI, CT scan, or other procedure, tell your doctor that you are using this medicine (On-Body Injector only). What side effects may I notice from receiving this medicine? Side effects that you should report to your doctor or health care professional as soon as possible: -allergic reactions like skin rash, itching or hives, swelling of the face, lips, or tongue -back pain -dizziness -fever -pain, redness, or irritation at site where injected -pinpoint red spots on the skin -red or dark-brown urine -shortness of breath or breathing problems -stomach or side pain, or pain at the shoulder -swelling -tiredness -trouble passing urine or change in the amount of urine Side effects that usually do not require medical attention (report to your doctor or health care professional if they continue or are bothersome): -bone pain -muscle pain This list may not describe all possible side effects. Call  your doctor for medical advice about side  effects. You may report side effects to FDA at 1-800-FDA-1088. Where should I keep my medicine? Keep out of the reach of children. If you are using this medicine at home, you will be instructed on how to store it. Throw away any unused medicine after the expiration date on the label. NOTE: This sheet is a summary. It may not cover all possible information. If you have questions about this medicine, talk to your doctor, pharmacist, or health care provider.  2019 [employer] (2017-11-10 16:57:08)

## 2018-11-18 ENCOUNTER — Encounter: Payer: Self-pay | Admitting: *Deleted

## 2018-11-19 ENCOUNTER — Other Ambulatory Visit: Payer: Self-pay

## 2018-11-19 ENCOUNTER — Other Ambulatory Visit: Payer: Self-pay | Admitting: Oncology

## 2018-11-19 ENCOUNTER — Ambulatory Visit (HOSPITAL_COMMUNITY)
Admission: RE | Admit: 2018-11-19 | Discharge: 2018-11-19 | Disposition: A | Discharge status: Home / Self Care | Payer: BLUE CROSS/BLUE SHIELD | Source: Ambulatory Visit | Attending: Oncology | Admitting: Oncology

## 2018-11-19 ENCOUNTER — Inpatient Hospital Stay: Payer: BLUE CROSS/BLUE SHIELD | Attending: Oncology

## 2018-11-19 VITALS — BP 147/68 | HR 69 | Temp 98.9°F | Resp 18

## 2018-11-19 DIAGNOSIS — C50211 Malignant neoplasm of upper-inner quadrant of right female breast: Secondary | ICD-10-CM | POA: Diagnosis not present

## 2018-11-19 DIAGNOSIS — E041 Nontoxic single thyroid nodule: Secondary | ICD-10-CM | POA: Diagnosis not present

## 2018-11-19 DIAGNOSIS — Z170 Estrogen receptor positive status [ER+]: Secondary | ICD-10-CM | POA: Insufficient documentation

## 2018-11-19 DIAGNOSIS — R599 Enlarged lymph nodes, unspecified: Secondary | ICD-10-CM | POA: Insufficient documentation

## 2018-11-19 DIAGNOSIS — E079 Disorder of thyroid, unspecified: Secondary | ICD-10-CM | POA: Diagnosis not present

## 2018-11-19 DIAGNOSIS — K639 Disease of intestine, unspecified: Secondary | ICD-10-CM | POA: Insufficient documentation

## 2018-11-19 DIAGNOSIS — N3289 Other specified disorders of bladder: Secondary | ICD-10-CM | POA: Diagnosis not present

## 2018-11-19 DIAGNOSIS — Z7689 Persons encountering health services in other specified circumstances: Secondary | ICD-10-CM | POA: Diagnosis not present

## 2018-11-19 DIAGNOSIS — K769 Liver disease, unspecified: Secondary | ICD-10-CM | POA: Insufficient documentation

## 2018-11-19 DIAGNOSIS — Z79899 Other long term (current) drug therapy: Secondary | ICD-10-CM | POA: Insufficient documentation

## 2018-11-19 DIAGNOSIS — D398 Neoplasm of uncertain behavior of other specified female genital organs: Secondary | ICD-10-CM | POA: Insufficient documentation

## 2018-11-19 DIAGNOSIS — Z5111 Encounter for antineoplastic chemotherapy: Secondary | ICD-10-CM | POA: Diagnosis not present

## 2018-11-19 DIAGNOSIS — E042 Nontoxic multinodular goiter: Secondary | ICD-10-CM | POA: Diagnosis not present

## 2018-11-19 MED ORDER — PEGFILGRASTIM-CBQV 6 MG/0.6ML ~~LOC~~ SOSY
PREFILLED_SYRINGE | SUBCUTANEOUS | Status: AC
  Filled 2018-11-19: qty 0.6

## 2018-11-19 MED ORDER — PEGFILGRASTIM-CBQV 6 MG/0.6ML ~~LOC~~ SOSY
6.0000 mg | PREFILLED_SYRINGE | Freq: Once | SUBCUTANEOUS | Status: AC
  Administered 2018-11-19: 15:00:00 6 mg via SUBCUTANEOUS

## 2018-11-19 MED ORDER — LIDOCAINE HCL 1 % IJ SOLN
INTRAMUSCULAR | Status: AC
  Filled 2018-11-19: qty 20

## 2018-11-19 NOTE — Patient Instructions (Signed)
Pegfilgrastim injection  What is this medicine?  PEGFILGRASTIM (PEG fil gra stim) is a long-acting granulocyte colony-stimulating factor that stimulates the growth of neutrophils, a type of white blood cell important in the body's fight against infection. It is used to reduce the incidence of fever and infection in patients with certain types of cancer who are receiving chemotherapy that affects the bone marrow, and to increase survival after being exposed to high doses of radiation.  This medicine may be used for other purposes; ask your health care provider or pharmacist if you have questions.  COMMON BRAND NAME(S): Fulphila, Neulasta, UDENYCA  What should I tell my health care provider before I take this medicine?  They need to know if you have any of these conditions:  -kidney disease  -latex allergy  -ongoing radiation therapy  -sickle cell disease  -skin reactions to acrylic adhesives (On-Body Injector only)  -an unusual or allergic reaction to pegfilgrastim, filgrastim, other medicines, foods, dyes, or preservatives  -pregnant or trying to get pregnant  -breast-feeding  How should I use this medicine?  This medicine is for injection under the skin. If you get this medicine at home, you will be taught how to prepare and give the pre-filled syringe or how to use the On-body Injector. Refer to the patient Instructions for Use for detailed instructions. Use exactly as directed. Tell your healthcare provider immediately if you suspect that the On-body Injector may not have performed as intended or if you suspect the use of the On-body Injector resulted in a missed or partial dose.  It is important that you put your used needles and syringes in a special sharps container. Do not put them in a trash can. If you do not have a sharps container, call your pharmacist or healthcare provider to get one.  Talk to your pediatrician regarding the use of this medicine in children. While this drug may be prescribed for  selected conditions, precautions do apply.  Overdosage: If you think you have taken too much of this medicine contact a poison control center or emergency room at once.  NOTE: This medicine is only for you. Do not share this medicine with others.  What if I miss a dose?  It is important not to miss your dose. Call your doctor or health care professional if you miss your dose. If you miss a dose due to an On-body Injector failure or leakage, a new dose should be administered as soon as possible using a single prefilled syringe for manual use.  What may interact with this medicine?  Interactions have not been studied.  Give your health care provider a list of all the medicines, herbs, non-prescription drugs, or dietary supplements you use. Also tell them if you smoke, drink alcohol, or use illegal drugs. Some items may interact with your medicine.  This list may not describe all possible interactions. Give your health care provider a list of all the medicines, herbs, non-prescription drugs, or dietary supplements you use. Also tell them if you smoke, drink alcohol, or use illegal drugs. Some items may interact with your medicine.  What should I watch for while using this medicine?  You may need blood work done while you are taking this medicine.  If you are going to need a MRI, CT scan, or other procedure, tell your doctor that you are using this medicine (On-Body Injector only).  What side effects may I notice from receiving this medicine?  Side effects that you should report to   your doctor or health care professional as soon as possible:  -allergic reactions like skin rash, itching or hives, swelling of the face, lips, or tongue  -back pain  -dizziness  -fever  -pain, redness, or irritation at site where injected  -pinpoint red spots on the skin  -red or dark-brown urine  -shortness of breath or breathing problems  -stomach or side pain, or pain at the shoulder  -swelling  -tiredness  -trouble passing urine or  change in the amount of urine  Side effects that usually do not require medical attention (report to your doctor or health care professional if they continue or are bothersome):  -bone pain  -muscle pain  This list may not describe all possible side effects. Call your doctor for medical advice about side effects. You may report side effects to FDA at 1-800-FDA-1088.  Where should I keep my medicine?  Keep out of the reach of children.  If you are using this medicine at home, you will be instructed on how to store it. Throw away any unused medicine after the expiration date on the label.  NOTE: This sheet is a summary. It may not cover all possible information. If you have questions about this medicine, talk to your doctor, pharmacist, or health care provider.   2019 [employer] (2017-11-10 16:57:08)

## 2018-11-23 ENCOUNTER — Telehealth: Payer: Self-pay | Admitting: *Deleted

## 2018-11-23 MED ORDER — FLUCONAZOLE 100 MG PO TABS: 100.0000 mg | ORAL_TABLET | Freq: Every day | ORAL | 0 refills | Status: DC

## 2018-11-23 NOTE — Telephone Encounter (Signed)
This RN returned VM left by pt per her concern of episode last night of " felt awful - I guess like a panic attack "..  Debbie [name] states she was woken during the night " with a pain in the middle of my chest- like at the bottom of my breast bone - it was a pain that went straight from my chest thru my back "  " then my heart was pounding  - and then I got nauseated and vomited "  Debbie [name] states she took a decadron and then later an ativan with good benefit and was able to return to sleep.  Today overall symptoms are improved.  She has developed some diarrhea which can be controlled with imodium " but then after taking the imodium my stomach hurts " ( not nausea ).  She states she has developed some labial skin tenderness and itching with no discharge or odor.  This RN discussed above symptoms secondary to support medications- discussed appropriate use of ativan, verified pt is taking protonix ( on med list ) as well as need for diflucan and simethecone.  Pharmacy verified.  Debbie [name] will keep a diary of symptoms for review at scheduled visit this Wednesday.  She understands she can call tomorrow if she has further issues not relieved with suggestions given today.

## 2018-11-24 ENCOUNTER — Ambulatory Visit (HOSPITAL_COMMUNITY): Payer: BLUE CROSS/BLUE SHIELD

## 2018-11-24 NOTE — Progress Notes (Signed)
[facility]  [phone] [phone]    ID: Debbie [name] DOB: Aug 18, 1961  MR#: [id number]  VVK#:[id number]  Patient Care Team: Lavada Mesi as PCP - General (Family Medicine) Mauro Kaufmann, RN as Oncology Nurse Navigator Rockwell Germany, RN as Oncology Nurse Navigator Alphonsa Overall, MD as Consulting Physician (General Surgery) Balbina Depace, Virgie Dad, MD as Consulting Physician (Oncology) Gery Pray, MD as Consulting Physician (Radiation Oncology) OTHER MD:   CHIEF COMPLAINT: HER-2 positive breast cancer  CURRENT TREATMENT: Neoadjuvant chemotherapy   INTERVAL HISTORY: Debbie [name] returns for follow up and treatment of her HER-2 positive breast cancer.  Since her last visit, she underwent right extra-thyroid lymph node aspirations on 11/19/2018. Two specimens were collected. Cytology from specimen 1 ([id number]) showed atypia of undetermined significance or follicular lesion of undetermined significance (Bethesda category III). Cytology from specimen 2 ([id number]) showed numerous lymphocytes with no metastatic carcinoma.  She also had a port placed on 11/16/2018 and started on carboplatin, docetaxel, trastuzumab, and Pertuzumab 11/17/2018.  This is to be repeated every 21 days x 6 after which she will be on trastuzumab alone to complete 1 year.  Today is cycle 1 day 9. She denies mouth sores and fevers. She notes a slightly dry mouth, but this is not bothersome for her as she has biotene on hand.  She received pegfilgrastim injection on Thursday, 11/19/2018. She felt "like I was dying" on Sunday. Her heart was pounding, chest was hurting under her breast bone, and spine was hurting. She experienced diarrhea on Friday, and notes she has not had a solid bowel movement in about 2 weeks. She has had up to 6-7 soft/loose bowel movements. She reports today she has not had any diarrhea. She notes the antidiarrheal (Imodium) caused bloating, so she took GasX  for relief.  Her most recent echocardiogram on 11/11/2018 showed an ejection fraction in the 55-60%, as well as a trivial pericardial effusion.  We had ordered an MRI of the liver and MRI of the pelvis, which is still pending.  Tentatively this is scheduled for 12/03/2018.   REVIEW OF SYSTEMS: Tiare is emotional today. She states her anxiety is not currently being controlled, which has caused her blood pressure to rise and trouble sleeping. She states she has been stressed by her recent biopsy, with not knowing the results, and became anxious. She also reports a history of IBS and has controlled it with diet. The patient denies unusual headaches, visual changes, nausea, vomiting, stiff neck, dizziness, or gait imbalance. There has been no cough, phlegm production, or pleurisy, no chest pain or pressure, and no change in bladder habits. The patient denies fever, rash, bleeding, unexplained fatigue or unexplained weight loss. A detailed review of systems was otherwise entirely negative.   HISTORY OF CURRENT ILLNESS: From the original intake note:  [employer] had routine screening mammography on 10/21/2018 showing a possible abnormality in the right breast. She felt a lump in her breast, possibly dating back to 01/2018, but she didn't think anything about it due to her history with fibrous breasts; she has had fibrous breasts all her life and began to have mammograms at the age of 45.   She underwent right breast ultrasonography at [facility] on 10/28/2018 showing: On physical exam, a firm, fixed mass is palpated in the superior right breast. Targeted ultrasound is performed, showing an irregular hypoechoic mass at the 1 o'clock position 4 cm from the nipple. It measures 2.8 x 2.4  x 1.8 cm. There is associated peripheral and internal vascularity. An oval, circumscribed hypoechoic mass is also identified within the vicinity at the 1 o'clock position 4 cm from the nipple. It measures 1.0 x  0.9 x 0.4 cm. This corresponds with an additional mammographically identified mass in the upper inner quadrant at middle depth. This is mammographically stable dating back to at least 2014. Additional stable, circumscribed masses are scattered throughout the bilateral breasts mammographically. Evaluation of the right axilla demonstrates a single, markedly abnormal lymph node with cortical thickening up to 1 cm. No additional suspicious lymphadenopathy is identified.  Accordingly on 10/28/2018 she proceeded to biopsy of the right breast area in question. The pathology from this procedure showed ([id number]): invasive ductal carcinoma, nottingham grade II of III, 2.8 cm, 1 o'clock, 4.0 cm from the nipple. Prognostic indicators significant for: estrogen receptor, 90% positive, with strong staining intensity and progesterone receptor, 0% negative. Proliferation marker Ki67 at 20%. HER2 Positive (3+) by immunohistochemistry.   An additional biopsy of the right axilla was performed on the same day ([id number]) showing:  2. Lymph node, needle/core biopsy, inferior right axilla - Tumor cells within fragmented nodal tissue  The patient's subsequent history is as detailed above.   PAST MEDICAL HISTORY: Past Medical History:  Diagnosis Date   Anxiety    Asthma    with severe URIs   Bronchitis    Essential hypertension, benign 12/01/2012   Generalized anxiety disorder 12/01/2012   Hypertension    Irritable bowel syndrome (IBS)    "had for years," per patient   PONV (postoperative nausea and vomiting)    Seasonal allergies    Sleep apnea      PAST SURGICAL HISTORY: Past Surgical History:  Procedure Laterality Date   ABDOMINAL HYSTERECTOMY  2011   with vaginal sling   ANKLE SURGERY     BREAST CYST EXCISION Bilateral    BREAST EXCISIONAL BIOPSY Bilateral    CHOLECYSTECTOMY  2012   KNEE ARTHROSCOPY W/ MEDIAL COLLATERAL LIGAMENT (MCL) REPAIR Left 10-07-2012   PORTACATH PLACEMENT  N/A 11/16/2018   Procedure: INSERTION PORT-A-CATH WITH ULTRASOUND;  Surgeon: Alphonsa Overall, MD;  Location: [facility];  Service: General;  Laterality: N/A;   TONSILLECTOMY  1969     FAMILY HISTORY: Family History  Problem Relation Age of Onset   Hypertension Mother    Rheum arthritis Mother    Heart failure Mother    Hypertension Father    Alcoholism Father    Colon cancer Father    Lung cancer Father    Depression Father    Diabetes Father    Skin cancer Father    Hypertension Brother    Stroke Brother    Atrial fibrillation Brother    Hyperlipidemia Maternal Grandfather    Stroke Paternal Grandmother    Skin cancer Paternal Grandmother    Healthy Daughter    Healthy Daughter    Breast cancer Neg Hx    Ovarian cancer Neg Hx    Prostate cancer Neg Hx    Pancreatic cancer Neg Hx    Jossie's father died from lung cancer at age 8. Patients' mother died from congestive heart failure at age 53. The patient has 1 brother. Patient denies anyone in her family having breast, ovarian, prostate, or pancreatic cancer. Amylah's father was diagnosed with skin cancer, colon cancer, and lung cancer. Debbie [name]'s paternal grandmother was diagnosed with skin cancer.    GYNECOLOGIC HISTORY:  No LMP recorded. Patient has had a hysterectomy. 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 Her husband, Halaina Vanduzer, is in [employer]	[place]. They have two daughters, Kerri Perches and Alderson. [address] Dawna Part is 63, lives in [place], [place], and works in Building surveyor. Adrian Blackwater [place] is 24, lives in [place], [place], and is currently in school; he currently works part time as a Publishing copy.   ADVANCED DIRECTIVES: In the absence of any documentation,  Maraya's spouse, [name], is her healthcare power of attorney.      HEALTH MAINTENANCE: Social History   Tobacco Use   Smoking status: Never Smoker   Smokeless tobacco: Never Used  Substance Use Topics   Alcohol use: Yes    Comment: 2 q mth   Drug use: No    Colonoscopy: yes, 2015, [facility]   PAP: 2011  Bone density: no Mammography: 10/21/2018  Allergies  Allergen Reactions   Azithromycin    Erythromycin Itching   Levofloxacin     Tendon pain   Penicillins Swelling and Rash    Did it involve swelling of the face/tongue/throat, SOB, or low BP? Yes Did it involve sudden or severe rash/hives, skin peeling, or any reaction on the inside of your mouth or nose? No Did you need to seek medical attention at a hospital or doctor's office? Yes When did it last happen?40+ years ago If all above answers are NO, may proceed with cephalosporin use.     Current Outpatient Medications  Medication Sig Dispense Refill   AMBULATORY NON FORMULARY MEDICATION Continuous positive airway pressure (CPAP) machine set at autopap, with all supplemental supplies as needed. 1 each 0   calcium-vitamin D (OSCAL WITH D) 500-200 MG-UNIT tablet Take 1 tablet by mouth daily with breakfast.     ciprofloxacin (CIPRO) 500 MG tablet Take 1 tablet (500 mg total) by mouth 2 (two) times daily. For 7 days. 14 tablet 0   dexamethasone (DECADRON) 4 MG tablet Take 2 tablets (8 mg total) by mouth 2 (two) times daily. Start the day before Taxotere. Take once the day after, then 2 times a day x 2d. 30 tablet 1   fexofenadine (ALLEGRA) 180 MG tablet Take 180 mg by mouth daily as needed for allergies or rhinitis.     fluconazole (DIFLUCAN) 100 MG tablet Take 1 tablet (100 mg total) by mouth daily. 7 tablet 0   hydrochlorothiazide (HYDRODIURIL) 12.5 MG tablet Take 1 tablet (12.5 mg total) by mouth daily. 90 tablet 1   lidocaine-prilocaine (EMLA) cream Apply to affected area once 30 g 3    Liraglutide -Weight Management (SAXENDA) 18 MG/3ML SOPN Inject 3 mg into the skin daily. Please include ultra fine needles 38m 15 pen 1   LORazepam (ATIVAN) 0.5 MG tablet Take 1 tablet (0.5 mg total) by mouth every 6 (six) hours as needed (Nausea or vomiting). 30 tablet 0   metroNIDAZOLE (FLAGYL) 500 MG tablet Take 1 tablet (500 mg total) by mouth 3 (three) times daily. For 7 days. 21 tablet 0   montelukast (SINGULAIR) 10 MG tablet Take 10 mg by mouth at bedtime.     Multiple Vitamin (MULTIVITAMIN WITH MINERALS) TABS tablet Take 1 tablet by mouth daily.     pantoprazole (PROTONIX) 40 MG tablet TAKE 1 TABLET DAILY (Patient taking differently: Take 40 mg by mouth daily. )  90 tablet 4   PROAIR HFA 108 (90 Base) MCG/ACT inhaler USE 2 INHALATIONS EVERY 4 HOURS AS NEEDED FOR WHEEZING (Patient taking differently: Inhale 2 puffs into the lungs every 4 (four) hours as needed for wheezing or shortness of breath. ) 25.5 g 1   Probiotic Product (VSL#3 PO) Take 1 tablet by mouth daily.     prochlorperazine (COMPAZINE) 10 MG tablet Take 1 tablet (10 mg total) by mouth every 6 (six) hours as needed (Nausea or vomiting). 30 tablet 1   rifaximin (XIFAXAN) 550 MG TABS tablet Take 1 tablet (550 mg total) by mouth 3 (three) times daily. For 14 days. 42 tablet 0   traMADol (ULTRAM) 50 MG tablet Take 1-2 tablets (50-100 mg total) by mouth every 6 (six) hours as needed. 14 tablet 1   valACYclovir (VALTREX) 1000 MG tablet TAKE 2 TABLETS EVERY 12 HOURS FOR ONE DAY, TAKE AT THE ONSET OF COLD SORE (Patient taking differently: Take 1,000 mg by mouth 2 (two) times daily as needed (cold sore). TAKE 2 TABLETS EVERY 12 HOURS FOR ONE DAY, TAKE AT THE ONSET OF COLD SORE) 21 tablet 1   venlafaxine XR (EFFEXOR-XR) 75 MG 24 hr capsule Take 1 capsule (75 mg total) by mouth daily with breakfast. 60 capsule 4   vitamin B-12 (CYANOCOBALAMIN) 1000 MCG tablet Take 1,000 mcg by mouth daily.     zolpidem (AMBIEN) 5 MG tablet Take  2.5 mg by mouth at bedtime as needed for sleep.     No current facility-administered medications for this visit.      OBJECTIVE: Middle-aged white woman who appears stated age  3:   11/25/18 1449  BP: (!) 159/76  Pulse: 94  Resp: 18  Temp: 98.6 F (37 C)  SpO2: 98%     Body mass index is 29.21 kg/m.   Wt Readings from Last 3 Encounters:  11/25/18 181 lb (82.1 kg)  11/16/18 182 lb 11.2 oz (82.9 kg)  11/13/18 181 lb 9 oz (82.4 kg)      ECOG FS:1 - Symptomatic but completely ambulatory  Sclerae unicteric, pupils round and equal Oropharynx clear and moist No cervical or supraclavicular adenopathy Lungs no rales or rhonchi Heart regular rate and rhythm Abd soft, nontender, positive bowel sounds MSK no focal spinal tenderness, no upper extremity lymphedema Neuro: nonfocal, well oriented, tearful and labile affect Breasts: The mass in the inferior slightly lateral aspect of the right breast is not easily palpable anymore.  LAB RESULTS:  CMP     Component Value Date/Time   NA 142 11/25/2018 1432   K 3.1 (L) 11/25/2018 1432   CL 103 11/25/2018 1432   CO2 28 11/25/2018 1432   GLUCOSE 90 11/25/2018 1432   BUN 14 11/25/2018 1432   CREATININE 0.77 11/25/2018 1432   CREATININE 0.79 11/04/2018 0835   CREATININE 0.66 07/08/2017 1127   CALCIUM 9.5 11/25/2018 1432   PROT 7.1 11/25/2018 1432   ALBUMIN 4.5 11/25/2018 1432   AST 33 11/25/2018 1432   AST 19 11/04/2018 0835   ALT 57 (H) 11/25/2018 1432   ALT 19 11/04/2018 0835   ALKPHOS 189 (H) 11/25/2018 1432   BILITOT 0.8 11/25/2018 1432   BILITOT 0.8 11/04/2018 0835   GFRNONAA >60 11/25/2018 1432   GFRNONAA >60 11/04/2018 0835   GFRNONAA 98 07/08/2017 1127   GFRAA >60 11/25/2018 1432   GFRAA >60 11/04/2018 0835   GFRAA 114 07/08/2017 1127    No results found for: TOTALPROTELP, ALBUMINELP, A1GS, A2GS, BETS,  BETA2SER, GAMS, MSPIKE, SPEI  No results found for: KPAFRELGTCHN, LAMBDASER, KAPLAMBRATIO  Lab Results    Component Value Date   WBC 25.5 (H) 11/25/2018   NEUTROABS PENDING 11/25/2018   HGB 13.6 11/25/2018   HCT 40.9 11/25/2018   MCV 85.2 11/25/2018   PLT 168 11/25/2018    _0 @  No results found for: LABCA2  No components found for: AUQJFH545  No results for input(s): INR in the last 168 hours.  No results found for: LABCA2  No results found for: GYB638  No results found for: LHT342  No results found for: AJG811  No results found for: CA2729  No components found for: HGQUANT  No results found for: CEA1 / No results found for: CEA1   No results found for: AFPTUMOR  No results found for: CHROMOGRNA  No results found for: PSA1  Appointment on 11/25/2018  Component Date Value Ref Range Status   Sodium 11/25/2018 142  135 - 145 mmol/L Final   Potassium 11/25/2018 3.1* 3.5 - 5.1 mmol/L Final   Chloride 11/25/2018 103  98 - 111 mmol/L Final   CO2 11/25/2018 28  22 - 32 mmol/L Final   Glucose, Bld 11/25/2018 90  70 - 99 mg/dL Final   BUN 11/25/2018 14  6 - 20 mg/dL Final   Creatinine, [name] 11/25/2018 0.77  0.44 - 1.00 mg/dL Final   Calcium 11/25/2018 9.5  8.9 - 10.3 mg/dL Final   Total Protein 11/25/2018 7.1  6.5 - 8.1 g/dL Final   Albumin 11/25/2018 4.5  3.5 - 5.0 g/dL Final   AST 11/25/2018 33  15 - 41 U/L Final   ALT 11/25/2018 57* 0 - 44 U/L Final   Alkaline Phosphatase 11/25/2018 189* 38 - 126 U/L Final   Total Bilirubin 11/25/2018 0.8  0.3 - 1.2 mg/dL Final   GFR calc non Af Amer 11/25/2018 >60  >60 mL/min Final   GFR calc Af Amer 11/25/2018 >60  >60 mL/min Final   Anion gap 11/25/2018 11  5 - 15 Final   Performed at [facility], [place] [address], [place], [place] [address]   WBC 11/25/2018 25.5* 4.0 - 10.5 K/uL Final   RBC 11/25/2018 4.80  3.87 - 5.11 MIL/uL Final   Hemoglobin 11/25/2018 13.6  12.0 - 15.0 g/dL Final   HCT 11/25/2018 40.9  36.0 - 46.0 % Final   MCV 11/25/2018 85.2  80.0 - 100.0 fL Final   MCH  11/25/2018 28.3  26.0 - 34.0 pg Final   MCHC 11/25/2018 33.3  30.0 - 36.0 g/dL Final   RDW 11/25/2018 12.6  11.5 - 15.5 % Final   Platelets 11/25/2018 168  150 - 400 K/uL Final   nRBC 11/25/2018 0.0  0.0 - 0.2 % Final   Performed at [facility], [place] [address] [place], [place] [address]   Neutrophils Relative % 11/25/2018 PENDING  % Incomplete   Neutro Abs 11/25/2018 PENDING  1.7 - 7.7 K/uL Incomplete   Band Neutrophils 11/25/2018 PENDING  % Incomplete   Lymphocytes Relative 11/25/2018 PENDING  % Incomplete   Lymphs Abs 11/25/2018 PENDING  0.7 - 4.0 K/uL Incomplete   Monocytes Relative 11/25/2018 PENDING  % Incomplete   Monocytes Absolute 11/25/2018 PENDING  0.1 - 1.0 K/uL Incomplete   Eosinophils Relative 11/25/2018 PENDING  % Incomplete   Eosinophils Absolute 11/25/2018 PENDING  0.0 - 0.5 K/uL Incomplete   Basophils Relative 11/25/2018 PENDING  % Incomplete   Basophils Absolute 11/25/2018 PENDING  0.0 - 0.1 K/uL Incomplete   WBC Morphology 11/25/2018 PENDING   Incomplete   RBC Morphology 11/25/2018 PENDING   Incomplete   Smear Review 11/25/2018 PENDING   Incomplete   Other 11/25/2018 PENDING  % Incomplete   nRBC 11/25/2018 PENDING  0 /100 WBC Incomplete   Metamyelocytes Relative 11/25/2018 PENDING  % Incomplete   Myelocytes 11/25/2018 PENDING  % Incomplete   Promyelocytes Relative 11/25/2018 PENDING  % Incomplete   Blasts 11/25/2018 PENDING  % Incomplete    (this displays the last labs from the last 3 days)  No results found for: TOTALPROTELP, ALBUMINELP, A1GS, A2GS, BETS, BETA2SER, GAMS, MSPIKE, SPEI (this displays SPEP labs)  No results found for: KPAFRELGTCHN, LAMBDASER, KAPLAMBRATIO (kappa/lambda light chains)  No results found for: HGBA, HGBA2QUANT, HGBFQUANT, HGBSQUAN (Hemoglobinopathy evaluation)   No results found for: LDH  Lab Results  Component Value Date   IRON 46 05/11/2013   TIBC 254 05/11/2013   IRONPCTSAT  18 (L) 05/11/2013   (Iron and TIBC)  Lab Results  Component Value Date   FERRITIN 72 05/11/2013    Urinalysis    Component Value Date/Time   BILIRUBINUR Small 11/09/2018 0914   PROTEINUR Negative 11/09/2018 0914   UROBILINOGEN 0.2 11/09/2018 0914   NITRITE Negative 11/09/2018 0914   LEUKOCYTESUR Negative 11/09/2018 0914     STUDIES:  Ct Chest W Contrast  Result Date: 11/09/2018 CLINICAL DATA:  Breast cancer EXAM: CT CHEST WITH CONTRAST TECHNIQUE: Multidetector CT imaging of the chest was performed during intravenous contrast administration. CONTRAST:  30m OMNIPAQUE IOHEXOL 300 MG/ML  SOLN COMPARISON:  None. FINDINGS: Cardiovascular: The heart is normal in size. No pericardial effusion. No evidence of thoracic aortic aneurysm. Mild coronary atherosclerosis of the LAD. Mediastinum/Nodes: No suspicious mediastinal or hilar lymphadenopathy. 13 mm short axis right axillary node (series 2/image 35), suspicious for nodal metastasis. Additional 7 mm short axis right subpectoral node (series 2/image 27), suspicious given the additional findings. Visualized thyroid is notable for subcentimeter nodules bilaterally. Lungs/Pleura: No suspicious pulmonary nodules. No focal consolidation. No pleural effusions or pneumothorax. Upper Abdomen: Visualized upper abdomen is grossly unremarkable. Musculoskeletal: 2.9 x 2.9 cm soft tissue mass in the medial right breast (series 2/image 66), corresponding to the patient's known primary breast neoplasm. Mild degenerative changes of the visualized thoracic spine. IMPRESSION: 2.9 cm soft tissue mass in the medial right breast, corresponding to the patient's known primary breast neoplasm. Suspected right axillary and subpectoral nodal metastases, as above. Electronically Signed   By: SJulian HyM.D.   On: 11/09/2018 17:44   Ct Abdomen Pelvis W Contrast  Result Date: 11/10/2018 CLINICAL DATA:  Right lower quadrant abdominal pain for 5 days. History of right  breast cancer. EXAM: CT ABDOMEN AND PELVIS WITH CONTRAST TECHNIQUE: Multidetector CT imaging of the abdomen and pelvis was performed using the standard protocol following bolus administration of intravenous contrast. CONTRAST:  1019mOMNIPAQUE IOHEXOL 300 MG/ML SOLN, 3080mMNIPAQUE IOHEXOL 300 MG/ML SOLN COMPARISON:  None. FINDINGS: Lower chest: Small pericardial effusion with maximum diameter of 12 mm at the base of the heart. Hepatobiliary: 5 mm hypoattenuated nodule in the medial segment of the right lobe of the liver. 2-3 mm hypoattenuated nodule in the dome of the liver. Post cholecystectomy. No evidence of biliary ductal dilation. Pancreas: Unremarkable. No pancreatic ductal dilatation or surrounding inflammatory changes. Spleen: Normal in size without focal abnormality. Adrenals/Urinary Tract: Adrenal glands are unremarkable. Kidneys are normal, without renal calculi, focal lesion, or hydronephrosis. There is a masslike thickening  of the anterior base of the urinary bladder measuring 2.9 x 2.6 cm, image 77/87, sequence 2. Stomach/Bowel: Stomach is within normal limits. No evidence of bowel wall small bowel thickening, distention, or inflammatory changes. No evidence of appendicitis. Mild nonspecific circumferential mucosal thickening of the sigmoid colon and rectum, nonspecific, possibly inflammatory. Vascular/Lymphatic: No significant vascular findings are present. No enlarged abdominal or pelvic lymph nodes. Reproductive: Status post hysterectomy. Mild prominence of the bilateral adnexa. Other: Small amount of free fluid in the pelvis. Musculoskeletal: No suspicious osseous lesions. Posterior facet arthropathy of the lower lumbosacral spine. IMPRESSION: 1. Small pericardial effusion. 2. Two less than 5 mm hypoattenuated liver nodules, too small to be accurately characterized by CT. 3. Masslike thickening of the anterior base of the urinary bladder. This may represent an area of of prominence of the proximal  urethra, however bladder mass cannot be excluded. This finding may be further evaluated with MRI of the pelvis, or direct visualization. 4. Mild prominence of the bilateral adnexa. If MRI of the pelvis is not performed, evaluation with pelvic ultrasound may be considered. 5. Nonspecific, probably inflammatory or infectious thickening of sigmoid colon and rectum. 6. Small amount of free fluid in the pelvis. Electronically Signed   By: Fidela Salisbury M.D.   On: 11/10/2018 18:28   Mr Breast Bilateral W Wo Contrast Inc Cad  Result Date: 11/10/2018 CLINICAL DATA:  58 year old female with biopsy proven malignancy of the right breast identified initially on screening mammography performed 10/21/2018. She had ultrasound-guided biopsy performed of the right breast mass and a right axillary lymph node on 10/28/2018 revealing grade 2 invasive ductal carcinoma in the right breast at 1 o'clock and tumor cells within fragmented nodal tissue in the right axilla. She has history of prior benign bilateral excisional biopsies. LABS:  Creatinine of 0.79 mg/dL and GFR greater than 60 on 11/04/2018. EXAM: BILATERAL BREAST MRI WITH AND WITHOUT CONTRAST TECHNIQUE: Multiplanar, multisequence MR images of both breasts were obtained prior to and following the intravenous administration of 8 ml of Gadavist. Three-dimensional MR images were rendered by post-processing of the original MR data on an independent workstation. The three-dimensional MR images were interpreted, and findings are reported in the following complete MRI report for this study. Three dimensional images were evaluated at the independent DynaCad workstation. COMPARISON:  No prior MRI available for comparison. Correlation made with prior mammograms and ultrasounds. FINDINGS: Breast composition: c. Heterogeneous fibroglandular tissue. Background parenchymal enhancement: Minimal. Right breast: In the central superior slightly medial right breast, there is a lobulated  intensely enhancing mass measuring 2.8 x 2.5 x 2.1 cm. Susceptibility artifact is seen in the anteromedial aspect of the mass compatible with the biopsy marking clip. There are 2 small nonenhancing to minimally enhancing masses, one in the upper-outer middle depth of the right breast, and another in the lower outer posterior depth of the right breast. Both of these have been mammographically stable for several years and are benign. Left breast: No suspicious mass or abnormal enhancement. There are 3 nonenhancing to minimally enhancing masses in the central to central inferior left breast. These have been mammographically stable over several years and are benign. Lymph nodes: There is one enlarged right axillary lymph node. Susceptibility artifact from the biopsy marking clip is seen within the lymph node corresponding with the known lymph node with tumor cells. No abnormal appearing lymph nodes in the left axilla. Ancillary findings:  None. IMPRESSION: 1. There is a 2.8 cm biopsy proven malignant mass in the  upper inner right breast. No evidence of additional sites of disease in the right breast. 2. There is a biopsy proven metastatic lymph node in the right axilla. No additional abnormal lymph nodes are identified. 3.  No evidence of left breast malignancy. RECOMMENDATION: Continue treatment plan for known right breast cancer. BI-RADS CATEGORY  6: Known biopsy-proven malignancy. Electronically Signed   By: Ammie Ferrier M.D.   On: 11/10/2018 11:57   [place] Fna Bx Thyroid 1st Lesion Afirma  Result Date: 11/19/2018 INDICATION: 58 year old female with a history breast carcinoma and thyroid ultrasound performed 11/06/2018 demonstrating right thyroid nodule with suspicious characteristics EXAM: ULTRASOUND GUIDED FINE NEEDLE ASPIRATION OF INDETERMINATE THYROID NODULE ULTRASOUND-GUIDED BIOPSY OF LYMPH NODE COMPARISON:  11/06/2018 MEDICATIONS: None COMPLICATIONS: None TECHNIQUE: Informed written consent was obtained  from the patient after a discussion of the risks, benefits and alternatives to treatment. Questions regarding the procedure were encouraged and answered. A timeout was performed prior to the initiation of the procedure. Pre-procedural ultrasound scanning demonstrated unchanged size and appearance of the indeterminate nodule within the right inferior thyroid The procedure was planned. The neck was prepped in the usual sterile fashion, and a sterile drape was applied covering the operative field. A timeout was performed prior to the initiation of the procedure. Local anesthesia was provided with 1% lidocaine. Under direct ultrasound guidance, 3 FNA biopsies were performed of the right inferior with a 25 gauge needle. Two additional biopsy performed for Afirma testing. Multiple ultrasound images were saved for procedural documentation purposes. The samples were prepared and submitted to pathology. Ultrasound survey was performed of the inferior extrathyroid nodule with images stored and sent to PACs. Ultrasound guidance was used to infiltrate this more inferior region with 1% lidocaine for local anesthesia. Three separate 25 gauge fine needle biopsy were then acquired of the extrathyroid nodule using ultrasound guidance. Images were stored. Slide preparation was performed for the thyroid nodule and the extrathyroid nodule. Final image was stored after biopsy. Patient tolerated the procedure well and remained hemodynamically stable throughout. No complications were encountered and no significant blood loss was encounter FINDINGS: Nodule reference number based on prior diagnostic ultrasound: 2 Maximum size: 2.1 cm Location: Right; Inferior ACR TI-RADS risk category: TR4 (4-6 points) Reason for biopsy: meets ACR TI-RADS criteria Ultrasound imaging confirms appropriate placement of the needles within the thyroid nodule. IMPRESSION: Status post ultrasound-guided biopsy of right thyroid nodule. Status post ultrasound-guided  biopsy of right extrathyroid nodule. Signed, Dulcy Fanny. Dellia Nims, RPVI [facility] Radiology Electronically Signed   By: Corrie Mckusick D.O.   On: 11/19/2018 15:23   Dg Chest Port 1 View  Result Date: 11/16/2018 CLINICAL DATA:  Status post Port-A-Cath placement today. EXAM: PORTABLE CHEST 1 VIEW COMPARISON:  CT chest 11/09/2018. FINDINGS: New left subclavian approach Port-A-Cath is in place with the tip projecting in the lower superior vena cava. No pneumothorax. Lungs are clear. Heart size is normal. No acute or focal bony abnormality. IMPRESSION: Left subclavian Port-A-Cath tip projects in the lower superior vena cava. Negative for pneumothorax. Electronically Signed   By: Inge Rise M.D.   On: 11/16/2018 09:24   Dg C-arm 1-60 Min-no Report  Result Date: 11/16/2018 Fluoroscopy was utilized by the requesting physician.  No radiographic interpretation.   US Breast Ltd Uni Right Inc Axilla  Result Date: 10/28/2018 CLINICAL DATA:  58 year old female recalled from screening mammogram dated 10/21/2018 for a right breast mass. The patient states she has developed a palpable lump in  the superior right breast over the last 1-2 weeks. EXAM: ULTRASOUND OF THE RIGHT BREAST COMPARISON:  Previous exam(s). FINDINGS: On physical exam, I palpate a firm, fixed mass in the superior right breast. Targeted ultrasound is performed, showing an irregular hypoechoic mass at the 1 o'clock position 4 cm from the nipple. It measures 2.8 x 2.4 x 1.8 cm. There is associated peripheral and internal vascularity. An oval, circumscribed hypoechoic mass is also identified within the vicinity at the 1 o'clock position 4 cm from the nipple. It measures 10 x 9 x 4 mm. This corresponds with an additional mammographically identified mass in the upper inner quadrant at middle depth. This is mammographically stable dating back to at least 2014. Additional stable, circumscribed masses are  scattered throughout the bilateral breasts mammographically. Evaluation of the right axilla demonstrates a single, markedly abnormal lymph node with cortical thickening up to 1 cm. No additional suspicious lymphadenopathy is identified. IMPRESSION: 1. Highly suspicious right breast mass corresponding with the screening mammographic findings and the patient's palpable lump. Recommendation is for ultrasound-guided biopsy. 2. A single suspicious right axillary lymph node. Recommendation is for ultrasound-guided biopsy. 3. Additional benign circumscribed mass in the vicinity of the index lesion. This corresponds with a mammographically stable mass dating back to at least 2014. RECOMMENDATION: 1. Two area ultrasound-guided biopsy of the right breast and right axilla. 2. Given the patient's breast density, bilateral contrast-enhanced breast MRI is recommended pending biopsy results. I have discussed the findings and recommendations with the patient. Results were also provided in writing at the conclusion of the visit. If applicable, a reminder letter will be sent to the patient regarding the next appointment. BI-RADS CATEGORY  5: Highly suggestive of malignancy. Electronically Signed   By: Kristopher Oppenheim M.D.   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 TUMOR CELLS WITHIN FRAGMENTED NODAL TISSUE of the Right axilla. This was found to be concordant by Dr. Curlene Dolphin. Pathology results were discussed with the patient by telephone. The patient reported doing well after the biopsies with tenderness at the sites. Post biopsy instructions and care were reviewed and questions were answered. The patient was encouraged to call [facility] for any additional concerns. The patient was referred to [facility] on November 04, 2018. Given the patient's breast density, bilateral contrast-enhanced breast MRI is recommended. Pathology results reported by Terie Purser, RN on 10/30/2018. Electronically Signed   By: Curlene Dolphin M.D.   On: 10/30/2018 12:08   Result Date: 10/30/2018 CLINICAL DATA:  Ultrasound-guided core needle biopsy was recommended of a suspicious right axillary lymph node. The patient is also having a suspicious right breast mass biopsied today. EXAM: [place] AXILLARY NODE CORE BIOPSY RIGHT COMPARISON:  Previous exam(s). FINDINGS: I met with the patient and we discussed the procedure of ultrasound-guided biopsy, including benefits and alternatives. We discussed the high likelihood of a successful procedure. We discussed the risks of the procedure, including infection, bleeding, tissue injury, clip migration, and inadequate sampling. Informed written consent was given. The usual time-out protocol was performed immediately prior to the procedure. Using sterile technique and 1% Lidocaine as local anesthetic, under direct ultrasound visualization, a 14 gauge spring-loaded device was used to perform biopsy of a right axillary lymph  node using a lateral approach. At the conclusion of the procedure a HydroMARK tissue marker clip was deployed into the biopsy cavity. Follow up 2 view mammogram was performed and dictated separately. IMPRESSION: Ultrasound guided biopsy of a right axillary lymph node. No apparent complications. Electronically Signed: By: Curlene Dolphin M.D. On: 10/28/2018 16:01   US Thyroid  Result Date: 11/06/2018 CLINICAL DATA:  Thyroid nodule.  New diagnosis of breast cancer. EXAM: THYROID ULTRASOUND TECHNIQUE: Ultrasound examination of the thyroid gland and adjacent soft tissues was performed. COMPARISON:  None. FINDINGS: Parenchymal Echotexture: Mildly heterogenous Isthmus: 0.1 cm Right lobe: 3.8 x 1.7 x 1.6 cm Left lobe: 4.0 x 1.7  x 1.4 cm _________________________________________________________ Estimated total number of nodules >/= 1 cm: 2 Number of spongiform nodules >/=  2 cm not described below (TR1): 0 Number of mixed cystic and solid nodules >/= 1.5 cm not described below (TR2): 0 _________________________________________________________ Nodule # 1: Location: Right; Mid Maximum size: 0.8 cm; Other 2 dimensions: 0.5 cm. Cannot evaluate height due to the posterior acoustic shadowing. Composition: cannot determine (2) Echogenicity: very hypoechoic (3) Shape: not taller-than-wide (0) Margins: ill-defined (0) Echogenic foci: peripheral calcifications (2) ACR TI-RADS total points: 7. ACR TI-RADS risk category: TR4 (4-6 points). ACR TI-RADS recommendations: *Given size (>/= 0.5 - 0.9 cm) and appearance, a follow-up ultrasound in 1 year should be considered based on TI-RADS criteria. _________________________________________________________ Nodule # 2: Location: Right; Inferior Maximum size: 2.1 cm; Other 2 dimensions: 1.1 x 1.3 cm Composition: solid/almost completely solid (2) Echogenicity: isoechoic (1) Shape: not taller-than-wide (0) Margins: ill-defined (0) Echogenic foci: punctate echogenic foci (3) ACR TI-RADS total points: 6. ACR TI-RADS risk category: TR4 (4-6 points). ACR TI-RADS recommendations: **Given size (>/= 1.5 cm) and appearance, fine needle aspiration of this moderately suspicious nodule should be considered based on TI-RADS criteria. _________________________________________________________ Nodule # 4: Location: Left; Mid Maximum size: 0.7 cm; Other 2 dimensions: 0.6 x 0.4 cm Composition: solid/almost completely solid (2) Echogenicity: hyperechoic (1) Shape: taller-than-wide (3) Margins: ill-defined (0) Echogenic foci: none (0) ACR TI-RADS total points: 6. ACR TI-RADS risk category: TR4 (4-6 points). ACR TI-RADS recommendations: Given size (<0.9 cm) and appearance, this nodule does NOT meet TI-RADS criteria for biopsy or  dedicated follow-up. _________________________________________________________ Nodule # 5: Location: Left; Inferior Maximum size: 1.1 cm; Other 2 dimensions: 0.7 x 0.7 cm Composition: solid/almost completely solid (2) Echogenicity: isoechoic (1) Shape: not taller-than-wide (0) Margins: ill-defined (0) Echogenic foci: none (0) ACR TI-RADS total points: 3. ACR TI-RADS risk category: TR3 (3 points). ACR TI-RADS recommendations: Given size (<1.4 cm) and appearance, this nodule does NOT meet TI-RADS criteria for biopsy or dedicated follow-up. _________________________________________________________ Nodule inferior to the right thyroid lobe measures 1.6 x 0.8 x 1.0 cm. Echogenicity of this nodule is similar to the thyroid. Question a few small echogenic foci within this nodular tissue. Morphology is not compatible with a normal lymph node. IMPRESSION: 1. Thyroid nodules. 2. Nodule #2 in the inferior right thyroid lobe has suspicious characteristics and recommend ultrasound-guided fine needle aspiration of this nodule. 3. Indeterminate 1.6 cm soft tissue nodule or lymph node inferior to the right thyroid lobe. Based on the history of right breast cancer, recommend ultrasound-guided biopsy of this soft tissue nodule. 4. Nodule #1 in the mid right thyroid lobe meets criteria for 1 year follow-up. The above is in keeping with the ACR TI-RADS recommendations - J Am Coll Radiol 2017;14:587-595. Electronically Signed   By: Markus Daft M.D.   On: 11/06/2018 12:15  Mm Clip Placement Right  Result Date: 10/28/2018 CLINICAL DATA:  Ultrasound-guided biopsies of a right breast mass and of a suspicious right axillary lymph node performed today. EXAM: DIAGNOSTIC RIGHT MAMMOGRAM POST ULTRASOUND BIOPSIES COMPARISON:  Previous exam(s). FINDINGS: Mammographic images were obtained following ULTRASOUND guided biopsy of a right breast mass at 1 o'clock position. A ribbon shaped biopsy clip is satisfactorily positioned along the  inferomedial aspect of the mass. A HydroMARK biopsy clip is satisfactorily positioned within an enlarged lymph node in the right axilla. IMPRESSION: Satisfactory position of ribbon shaped biopsy clip in the inferomedial portion of the right breast mass. Satisfactory position of HydroMARK biopsy clip in the right axillary lymph node. Final Assessment: Post Procedure Mammograms for Marker Placement Electronically Signed   By: Curlene Dolphin M.D.   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 TUMOR CELLS WITHIN FRAGMENTED NODAL TISSUE of the Right axilla. This was found to be concordant by Dr. Curlene Dolphin. Pathology results were discussed with the patient by telephone. The patient reported doing well after the biopsies with tenderness at the sites. Post biopsy instructions and care were reviewed and questions were answered. The patient was encouraged to call [facility] for any additional concerns. The patient was referred to [facility] on November 04, 2018. Given the patient's breast density, bilateral contrast-enhanced breast MRI is recommended. Pathology results reported by Terie Purser, RN on 10/30/2018. Electronically Signed   By: Curlene Dolphin M.D.   On: 10/30/2018 12:09   Result Date: 10/30/2018 CLINICAL DATA:  Ultrasound-guided core needle biopsy was recommended of a suspicious palpable mass in the upper inner right breast. EXAM: ULTRASOUND GUIDED RIGHT BREAST CORE NEEDLE BIOPSY COMPARISON:  Previous exam(s). FINDINGS: I met with the patient and we discussed the procedure of ultrasound-guided biopsy, including benefits and alternatives. We discussed the high likelihood of a successful procedure. We  discussed the risks of the procedure, including infection, bleeding, tissue injury, clip migration, and inadequate sampling. Informed written consent was given. The usual time-out protocol was performed immediately prior to the procedure. Lesion quadrant: Upper inner quadrant Using sterile technique and 1% Lidocaine as local anesthetic, under direct ultrasound visualization, a 12 gauge spring-loaded device was used to perform biopsy of a palpable hypoechoic mass 1 o'clock position using a lateral approach. At the conclusion of the procedure a ribbon tissue marker clip was deployed into the biopsy cavity. Follow up 2 view mammogram was performed and dictated separately. IMPRESSION: Ultrasound guided biopsy of the right breast. No apparent complications. Electronically Signed: By: Curlene Dolphin M.D. On: 10/28/2018 16:00   [place] Fna Bx Thyroid Ea Add Lesion Afirma  Result Date: 11/19/2018 INDICATION: 58 year old female with a history breast carcinoma and thyroid ultrasound performed 11/06/2018 demonstrating right thyroid nodule with suspicious characteristics EXAM: ULTRASOUND GUIDED FINE NEEDLE ASPIRATION OF INDETERMINATE THYROID NODULE ULTRASOUND-GUIDED BIOPSY OF LYMPH NODE COMPARISON:  11/06/2018 MEDICATIONS: None COMPLICATIONS: None TECHNIQUE: Informed written consent was obtained from the patient after a discussion of the risks, benefits and alternatives to treatment. Questions regarding the procedure were encouraged and answered. A timeout was performed prior to the initiation of the procedure. Pre-procedural ultrasound scanning demonstrated unchanged size and appearance of the indeterminate nodule  within the right inferior thyroid The procedure was planned. The neck was prepped in the usual sterile fashion, and a sterile drape was applied covering the operative field. A timeout was performed prior to the initiation of the procedure. Local anesthesia was provided with 1% lidocaine. Under direct ultrasound  guidance, 3 FNA biopsies were performed of the right inferior with a 25 gauge needle. Two additional biopsy performed for Afirma testing. Multiple ultrasound images were saved for procedural documentation purposes. The samples were prepared and submitted to pathology. Ultrasound survey was performed of the inferior extrathyroid nodule with images stored and sent to PACs. Ultrasound guidance was used to infiltrate this more inferior region with 1% lidocaine for local anesthesia. Three separate 25 gauge fine needle biopsy were then acquired of the extrathyroid nodule using ultrasound guidance. Images were stored. Slide preparation was performed for the thyroid nodule and the extrathyroid nodule. Final image was stored after biopsy. Patient tolerated the procedure well and remained hemodynamically stable throughout. No complications were encountered and no significant blood loss was encounter FINDINGS: Nodule reference number based on prior diagnostic ultrasound: 2 Maximum size: 2.1 cm Location: Right; Inferior ACR TI-RADS risk category: TR4 (4-6 points) Reason for biopsy: meets ACR TI-RADS criteria Ultrasound imaging confirms appropriate placement of the needles within the thyroid nodule. IMPRESSION: Status post ultrasound-guided biopsy of right thyroid nodule. Status post ultrasound-guided biopsy of right extrathyroid nodule. Signed, Dulcy Fanny. Dellia Nims, RPVI [facility] Radiology Electronically Signed   By: Corrie Mckusick D.O.   On: 11/19/2018 15:23     ELIGIBLE FOR AVAILABLE RESEARCH PROTOCOL: no   ASSESSMENT: 58 y.o. Debbie [name], [place] woman status post right breast upper inner quadrant biopsy 10/28/2018 for a clinical T2 N1, stage IIA invasive ductal carcinoma, grade 2, estrogen receptor positive, progesterone receptor negative, HER-2 amplified, with an MIB-1 of 20%  (a) staging work-up with CT scans of the chest abdomen and pelvis and thyroid ultrasonography  shows multiple findings that will require follow-up:   (i) two 0.5 cm liver lesions   (ii) possible bladder mass, thickened sigmoid and rectum, and prominent adnexa   (iii) thyroid nodule biopsied 11/19/2018, read as [place] 3    (iiii)  thyroid lymph node biopsied 11/19/2018 showing no evidence of carcinoma  (1) neoadjuvant chemotherapy will consist of carboplatin, docetaxel, trastuzumab, and Pertuzumab every 21 days x 6 starting 11/17/2018  (2) trastuzumab will be continued to complete 1 year  (a) baseline echocardiogram 11/11/2018 showed an ejection fraction in the 55-60% range  (3) definitive surgery to follow  (4) adjuvant radiation as appropriate  (5) antiestrogens at the completion of local treatment  (6) referral thyroid surgeon at the completion of adjuvant radiation   PLAN: Debbie [name] did moderately well with her first cycle of chemotherapy, but she did have some issues that I think we can improve on.  She has significant pain from the Neulasta.  This worked very well for her and she has an excellent count today, but it was very uncomfortable.  Usually with subsequent doses the pain is less but what she will use for pain if the pain does occur his Tylenol 500 mg and Aleve 220 mg taken together up to 3 times daily.  She also had an episode of reflux which was severe even though she is taking Protonix daily.  If it recurs she will stay vertical, drink a glass of water, and take Tums, Mylanta, or similar drugs until the discomfort passes  She was very emotional  today and I think she will be helped by venlafaxine.  We are going to start a 75 mg daily.  After a few weeks if she thinks that is helping we can up at 250 mg daily.  She had significant problems with diarrhea and she has a history of irritable bowel.  I am concerned that if we do not hold the Perjeta we may end up with significant erosions and that can be actually life-threatening in this context.  Accordingly we are holding  the Perjeta at least for the next 2 cycles and possibly permanently.  She tells me her disability or family leave papers were not filled correctly.  She will work with our navigators and fax [place] the appropriate papers so we can complete them as she needs them to be completed.  Basically she tells me she is able to work from home at least part of the time during this chemotherapy  For reasons that have nothing to do with her treatments that she had been scheduled for labs 1 day on treatment the other.  We have corrected that so she will have flush labs visit and treatment all on the same day for the remaining chemotherapy cycles  She understands the thyroid biopsies did not show cancer but do require follow-up.  I will see her 12/08/2018, with cycle #2.  At that time we will also discussed the MRI results from her pelvics and liver  She knows to call for any other issue that may develop before the next set here.   Zyrah Wiswell, Virgie Dad, MD  11/25/18 3:15 PM Medical Oncology and Hematology [facility] [address] [place], [place] [address] Tel. [phone]    Fax. [phone]    I, Wilburn Mylar, am acting as scribe for Dr. Virgie Dad. Linea Calles.  I, Lurline Del MD, have reviewed the above documentation for accuracy and completeness, and I agree with the above.

## 2018-11-25 ENCOUNTER — Inpatient Hospital Stay: Payer: BLUE CROSS/BLUE SHIELD

## 2018-11-25 ENCOUNTER — Inpatient Hospital Stay (HOSPITAL_BASED_OUTPATIENT_CLINIC_OR_DEPARTMENT_OTHER): Payer: BLUE CROSS/BLUE SHIELD | Admitting: Oncology

## 2018-11-25 ENCOUNTER — Encounter: Payer: Self-pay | Admitting: Oncology

## 2018-11-25 ENCOUNTER — Other Ambulatory Visit: Payer: Self-pay

## 2018-11-25 VITALS — BP 159/76 | HR 94 | Temp 98.6°F | Resp 18 | Ht 66.0 in | Wt 181.0 lb

## 2018-11-25 DIAGNOSIS — C50211 Malignant neoplasm of upper-inner quadrant of right female breast: Secondary | ICD-10-CM

## 2018-11-25 DIAGNOSIS — Z79899 Other long term (current) drug therapy: Secondary | ICD-10-CM

## 2018-11-25 DIAGNOSIS — Z5111 Encounter for antineoplastic chemotherapy: Secondary | ICD-10-CM | POA: Diagnosis not present

## 2018-11-25 DIAGNOSIS — Z7689 Persons encountering health services in other specified circumstances: Secondary | ICD-10-CM | POA: Diagnosis not present

## 2018-11-25 DIAGNOSIS — Z170 Estrogen receptor positive status [ER+]: Secondary | ICD-10-CM

## 2018-11-25 LAB — COMPREHENSIVE METABOLIC PANEL
ALT: 57 U/L — ABNORMAL HIGH (ref 0–44)
AST: 33 U/L (ref 15–41)
Albumin: 4.5 g/dL (ref 3.5–5.0)
Alkaline Phosphatase: 189 U/L — ABNORMAL HIGH (ref 38–126)
Anion gap: 11 (ref 5–15)
BUN: 14 mg/dL (ref 6–20)
CO2: 28 mmol/L (ref 22–32)
Calcium: 9.5 mg/dL (ref 8.9–10.3)
Chloride: 103 mmol/L (ref 98–111)
Creatinine, Ser: 0.77 mg/dL (ref 0.44–1.00)
GFR calc Af Amer: >60 mL/min (ref >60)
GFR calc non Af Amer: >60 mL/min (ref >60)
Glucose, Bld: 90 mg/dL (ref 70–99)
Potassium: 3.1 mmol/L — ABNORMAL LOW (ref 3.5–5.1)
Sodium: 142 mmol/L (ref 135–145)
Total Bilirubin: 0.8 mg/dL (ref 0.3–1.2)
Total Protein: 7.1 g/dL (ref 6.5–8.1)

## 2018-11-25 LAB — CBC WITH DIFFERENTIAL/PLATELET
Abs Immature Granulocytes: 3.83 10*3/uL — ABNORMAL HIGH (ref 0.00–0.07)
Basophils Absolute: 0 10*3/uL (ref 0.0–0.1)
Basophils Relative: 0 %
Eosinophils Absolute: 0.1 10*3/uL (ref 0.0–0.5)
Eosinophils Relative: 0 %
HCT: 40.9 % (ref 36.0–46.0)
Hemoglobin: 13.6 g/dL (ref 12.0–15.0)
Immature Granulocytes: 15 %
Lymphocytes Relative: 11 %
Lymphs Abs: 2.7 10*3/uL (ref 0.7–4.0)
MCH: 28.3 pg (ref 26.0–34.0)
MCHC: 33.3 g/dL (ref 30.0–36.0)
MCV: 85.2 fL (ref 80.0–100.0)
Monocytes Absolute: 2.7 10*3/uL — ABNORMAL HIGH (ref 0.1–1.0)
Monocytes Relative: 11 %
Neutro Abs: 16.2 10*3/uL — ABNORMAL HIGH (ref 1.7–7.7)
Neutrophils Relative %: 63 %
Platelets: 168 10*3/uL (ref 150–400)
RBC: 4.8 MIL/uL (ref 3.87–5.11)
RDW: 12.6 % (ref 11.5–15.5)
WBC: 25.5 10*3/uL — ABNORMAL HIGH (ref 4.0–10.5)
nRBC: 0 % (ref 0.0–0.2)

## 2018-11-25 MED ORDER — VENLAFAXINE HCL ER 75 MG PO CP24: 75.0000 mg | ORAL_CAPSULE | Freq: Every day | ORAL | 4 refills | Status: DC

## 2018-11-30 ENCOUNTER — Telehealth: Payer: Self-pay | Admitting: *Deleted

## 2018-11-30 NOTE — Telephone Encounter (Signed)
Pt return call. Pt request letter to be sent to the [employer] she may work from home when pt feels it's feasible. Request sent to [facility].

## 2018-11-30 NOTE — Telephone Encounter (Signed)
Pt sent email requesting return call. Called pt, no answer was unable to leave msg d/t mailbox full. Responded to pt email informing returned call and request pt to call back at her convenience. Contact information provided.

## 2018-12-03 ENCOUNTER — Ambulatory Visit (HOSPITAL_COMMUNITY): Payer: BLUE CROSS/BLUE SHIELD

## 2018-12-03 ENCOUNTER — Telehealth: Payer: Self-pay

## 2018-12-03 ENCOUNTER — Encounter (HOSPITAL_COMMUNITY): Payer: Self-pay

## 2018-12-03 NOTE — Telephone Encounter (Signed)
Neulasta Onpro Patient Outreach Note  Patient was contacted on 12/03/2018 in regards to switching G-CSF therapy to Neulasta Onpro ([email or web address]). Patient was educated on the purpose of this proposed change in therapy due to COVID-19 pandemic. Patient was educated about Neulasta Onpro on-body injector and patient will be provided with an educational video while in infusion on their next scheduled date if changed to Neulasta Onpro.   [x]  Patient agrees to change in therapy. Begin process to change to Neulasta Onpro therapy.  []  Patient does not agree to change in therapy. No change to Neulasta Onpro at this time.    Thank You,  Egbert Garibaldi  12/03/2018 2:49 PM

## 2018-12-04 ENCOUNTER — Telehealth: Payer: Self-pay | Admitting: *Deleted

## 2018-12-04 NOTE — Telephone Encounter (Signed)
Medical records faxed to [facility]; [place] [phone]

## 2018-12-07 ENCOUNTER — Telehealth: Payer: Self-pay | Admitting: Adult Health

## 2018-12-07 ENCOUNTER — Other Ambulatory Visit: Payer: BLUE CROSS/BLUE SHIELD

## 2018-12-07 NOTE — Telephone Encounter (Signed)
Called [facility] about MRI of the abdomen and pelvis for [name].    Both studies are authorized [id number] for Debbie [name], Debbie [name], Debbie [name], Debbie [name] expires 02/20/2019.  Wilber Bihari, NP

## 2018-12-07 NOTE — Progress Notes (Signed)
[facility]  [phone] [phone]    ID: Debbie [name] DOB: 1961/03/27  MR#: [id number]  QIO#:[id number]  Patient Care Team: Lavada Mesi as PCP - General (Family Medicine) Mauro Kaufmann, RN as Oncology Nurse Navigator Rockwell Germany, RN as Oncology Nurse Navigator Alphonsa Overall, MD as Consulting Physician (General Surgery) Jhonatan Lomeli, Virgie Dad, MD as Consulting Physician (Oncology) Gery Pray, MD as Consulting Physician (Radiation Oncology) OTHER MD:   CHIEF COMPLAINT: HER-2 positive breast cancer  CURRENT TREATMENT: Neoadjuvant chemotherapy   INTERVAL HISTORY: Debbie [name] returns today for follow-up and treatment of her HER-2 positive breast cancer.   She continues on neoadjuvant chemotherapy consisting of carboplatin, docetaxel, trastuzumab, and Pertuzumab every 21 days x 6.  We have eliminated the Pertuzumab from this and the next cycle because of significant diarrhea problems she had previously.  Today is day 1 cycle 2. She notes that she feels much better than last week. Her diarrhea has mostly  subsided, she had two or three bowel movements yesterday. She notes that her mouth was really dry, but she used Biotene with success. Her sense of taste is altered. She is losing her hair and had her daughter buzz it.  She has had no peripheral neuropathy symptoms.  Debbie [name]'s last echocardiogram on 11/11/2018, showed an ejection fraction in the 55% - 60% range.   Since her last visit here, she has not undergone any additional studies.     REVIEW OF SYSTEMS: Debbie [name] walks with her daughter in her neighborhood for exercise. The patient denies unusual headaches, visual changes, nausea, vomiting, or dizziness. There has been no unusual cough, phlegm production, or pleurisy. This been no change in bladder habits. The patient denies unexplained fatigue or unexplained weight loss, bleeding, rash, or fever. A detailed review of systems was  otherwise noncontributory.    HISTORY OF CURRENT ILLNESS: From the original intake note:  Debbie [name] had routine screening mammography on 10/21/2018 showing a possible abnormality in the right breast. She felt a lump in her breast, possibly dating back to 01/2018, but she didn't think anything about it due to her history with fibrous breasts; she has had fibrous breasts all her life and began to have mammograms at the age of 23.   She underwent right breast ultrasonography at [facility] on 10/28/2018 showing: On physical exam, a firm, fixed mass is palpated in the superior right breast. Targeted ultrasound is performed, showing an irregular hypoechoic mass at the 1 o'clock position 4 cm from the nipple. It measures 2.8 x 2.4 x 1.8 cm. There is associated peripheral and internal vascularity. An oval, circumscribed hypoechoic mass is also identified within the vicinity at the 1 o'clock position 4 cm from the nipple. It measures 1.0 x 0.9 x 0.4 cm. This corresponds with an additional mammographically identified mass in the upper inner quadrant at middle depth. This is mammographically stable dating back to at least 2014. Additional stable, circumscribed masses are scattered throughout the bilateral breasts mammographically. Evaluation of the right axilla demonstrates a single, markedly abnormal lymph node with cortical thickening up to 1 cm. No additional suspicious lymphadenopathy is identified.  Accordingly on 10/28/2018 she proceeded to biopsy of the right breast area in question. The pathology from this procedure showed ([id number]): invasive ductal carcinoma, nottingham grade II of III, 2.8 cm, 1 o'clock, 4.0 cm from the nipple. Prognostic indicators significant for: estrogen receptor, 90% positive, with strong staining intensity and progesterone receptor, 0% negative. Proliferation marker  Ki67 at 20%. HER2 Positive (3+) by immunohistochemistry.   An additional biopsy of the right axilla  was performed on the same day ([id number]) showing:  2. Lymph node, needle/core biopsy, inferior right axilla - Tumor cells within fragmented nodal tissue  The patient's subsequent history is as detailed above.   PAST MEDICAL HISTORY: Past Medical History:  Diagnosis Date   Anxiety    Asthma    with severe URIs   Bronchitis    Essential hypertension, benign 12/01/2012   Generalized anxiety disorder 12/01/2012   Hypertension    Irritable bowel syndrome (IBS)    "had for years," per patient   PONV (postoperative nausea and vomiting)    Seasonal allergies    Sleep apnea      PAST SURGICAL HISTORY: Past Surgical History:  Procedure Laterality Date   ABDOMINAL HYSTERECTOMY  2011   with vaginal sling   ANKLE SURGERY     BREAST CYST EXCISION Bilateral    BREAST EXCISIONAL BIOPSY Bilateral    CHOLECYSTECTOMY  2012   KNEE ARTHROSCOPY W/ MEDIAL COLLATERAL LIGAMENT (MCL) REPAIR Left 10-07-2012   PORTACATH PLACEMENT N/A 11/16/2018   Procedure: INSERTION PORT-A-CATH WITH ULTRASOUND;  Surgeon: Alphonsa Overall, MD;  Location: [facility];  Service: General;  Laterality: N/A;   TONSILLECTOMY  1969     FAMILY HISTORY: Family History  Problem Relation Age of Onset   Hypertension Mother    Rheum arthritis Mother    Heart failure Mother    Hypertension Father    Alcoholism Father    Colon cancer Father    Lung cancer Father    Depression Father    Diabetes Father    Skin cancer Father    Hypertension Brother    Stroke Brother    Atrial fibrillation Brother    Hyperlipidemia Maternal Grandfather    Stroke Paternal Grandmother    Skin cancer Paternal Grandmother    Healthy Daughter    Healthy Daughter    Breast cancer Neg Hx    Ovarian cancer Neg Hx    Prostate cancer Neg Hx    Pancreatic cancer Neg Hx    Debbie [name]'s father died from lung cancer at age 67. Patients' mother died from congestive heart failure at age 83. The patient has 1 brother.  Patient denies anyone in her family having breast, ovarian, prostate, or pancreatic cancer. Debbie [name]'s father was diagnosed with skin cancer, colon cancer, and lung cancer. Debbie [name]'s paternal grandmother was diagnosed with skin cancer.    GYNECOLOGIC HISTORY:  No LMP recorded. Patient has had a hysterectomy. 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 Her husband, Roneisha Stern, is in [employer]	[place]. They have two daughters, Kerri Perches and [place]. [address] Dawna Part is 46, lives in [place], [place], and works in Building surveyor. Adrian Blackwater [place] is 67, lives in [place], [place], and is currently in school; he currently works part time as a Publishing copy.   ADVANCED DIRECTIVES: In the absence of any documentation, Yalda's spouse, [name], is her healthcare power of attorney.      HEALTH MAINTENANCE: Social History   Tobacco Use   Smoking status: Never Smoker   Smokeless tobacco: Never Used  Substance Use Topics   Alcohol use: Yes    Comment: 2 q mth   Drug  use: No    Colonoscopy: yes, 2015, [facility]   PAP: 2011  Bone density: no Mammography: 10/21/2018  Allergies  Allergen Reactions   Azithromycin    Erythromycin Itching   Levofloxacin     Tendon pain   Penicillins Swelling and Rash    Did it involve swelling of the face/tongue/throat, SOB, or low BP? Yes Did it involve sudden or severe rash/hives, skin peeling, or any reaction on the inside of your mouth or nose? No Did you need to seek medical attention at a hospital or doctor's office? Yes When did it last happen?40+ years ago If all above answers are NO, may proceed with cephalosporin use.     Current Outpatient Medications  Medication Sig Dispense Refill    AMBULATORY NON FORMULARY MEDICATION Continuous positive airway pressure (CPAP) machine set at autopap, with all supplemental supplies as needed. 1 each 0   calcium-vitamin D (OSCAL WITH D) 500-200 MG-UNIT tablet Take 1 tablet by mouth daily with breakfast.     ciprofloxacin (CIPRO) 500 MG tablet Take 1 tablet (500 mg total) by mouth 2 (two) times daily. For 7 days. 14 tablet 0   dexamethasone (DECADRON) 4 MG tablet Take 2 tablets (8 mg total) by mouth 2 (two) times daily. Start the day before Taxotere. Take once the day after, then 2 times a day x 2d. 30 tablet 1   fexofenadine (ALLEGRA) 180 MG tablet Take 180 mg by mouth daily as needed for allergies or rhinitis.     fluconazole (DIFLUCAN) 100 MG tablet Take 1 tablet (100 mg total) by mouth daily. 7 tablet 0   hydrochlorothiazide (HYDRODIURIL) 12.5 MG tablet Take 1 tablet (12.5 mg total) by mouth daily. 90 tablet 1   lidocaine-prilocaine (EMLA) cream Apply to affected area once 30 g 3   Liraglutide -Weight Management (SAXENDA) 18 MG/3ML SOPN Inject 3 mg into the skin daily. Please include ultra fine needles 47m 15 pen 1   LORazepam (ATIVAN) 0.5 MG tablet Take 1 tablet (0.5 mg total) by mouth every 6 (six) hours as needed (Nausea or vomiting). 30 tablet 0   metroNIDAZOLE (FLAGYL) 500 MG tablet Take 1 tablet (500 mg total) by mouth 3 (three) times daily. For 7 days. 21 tablet 0   montelukast (SINGULAIR) 10 MG tablet Take 10 mg by mouth at bedtime.     Multiple Vitamin (MULTIVITAMIN WITH MINERALS) TABS tablet Take 1 tablet by mouth daily.     pantoprazole (PROTONIX) 40 MG tablet TAKE 1 TABLET DAILY (Patient taking differently: Take 40 mg by mouth daily. ) 90 tablet 4   PROAIR HFA 108 (90 Base) MCG/ACT inhaler USE 2 INHALATIONS EVERY 4 HOURS AS NEEDED FOR WHEEZING (Patient taking differently: Inhale 2 puffs into the lungs every 4 (four) hours as needed for wheezing or shortness of breath. ) 25.5 g 1   Probiotic Product (VSL#3 PO) Take 1  tablet by mouth daily.     prochlorperazine (COMPAZINE) 10 MG tablet Take 1 tablet (10 mg total) by mouth every 6 (six) hours as needed (Nausea or vomiting). 30 tablet 1   rifaximin (XIFAXAN) 550 MG TABS tablet Take 1 tablet (550 mg total) by mouth 3 (three) times daily. For 14 days. 42 tablet 0   traMADol (ULTRAM) 50 MG tablet Take 1-2 tablets (50-100 mg total) by mouth every 6 (six) hours as needed. 14 tablet 1   valACYclovir (VALTREX) 1000 MG tablet TAKE 2 TABLETS EVERY 12 HOURS FOR ONE DAY, TAKE AT THE ONSET OF  COLD SORE (Patient taking differently: Take 1,000 mg by mouth 2 (two) times daily as needed (cold sore). TAKE 2 TABLETS EVERY 12 HOURS FOR ONE DAY, TAKE AT THE ONSET OF COLD SORE) 21 tablet 1   venlafaxine XR (EFFEXOR-XR) 75 MG 24 hr capsule Take 1 capsule (75 mg total) by mouth daily with breakfast. 60 capsule 4   vitamin B-12 (CYANOCOBALAMIN) 1000 MCG tablet Take 1,000 mcg by mouth daily.     zolpidem (AMBIEN) 5 MG tablet Take 2.5 mg by mouth at bedtime as needed for sleep.     No current facility-administered medications for this visit.      OBJECTIVE: Middle-aged white woman in no acute distress  Vitals:   12/08/18 0812  BP: 125/73  Pulse: 74  Resp: 18  Temp: 98.2 F (36.8 C)  SpO2: 97%     Body mass index is 28.94 kg/m.   Wt Readings from Last 3 Encounters:  12/08/18 179 lb 4.8 oz (81.3 kg)  11/25/18 181 lb (82.1 kg)  11/16/18 182 lb 11.2 oz (82.9 kg)      ECOG FS:1 - Symptomatic but completely ambulatory  Sclerae unicteric, EOMs intact Wearing a mask No cervical or supraclavicular adenopathy Lungs no rales or rhonchi Heart regular rate and rhythm Abd soft, nontender, positive bowel sounds MSK no focal spinal tenderness, no upper extremity lymphedema Neuro: nonfocal, well oriented, appropriate affect Breasts: As noted before, the mass in the right breast, which used to be easily palpable, is no longer palpable  LAB RESULTS:  CMP     Component  Value Date/Time   NA 142 12/08/2018 0750   K 3.1 (L) 12/08/2018 0750   CL 103 12/08/2018 0750   CO2 28 12/08/2018 0750   GLUCOSE 87 12/08/2018 0750   BUN 16 12/08/2018 0750   CREATININE 0.74 12/08/2018 0750   CREATININE 0.79 11/04/2018 0835   CREATININE 0.66 07/08/2017 1127   CALCIUM 9.5 12/08/2018 0750   PROT 6.4 (L) 12/08/2018 0750   ALBUMIN 3.9 12/08/2018 0750   AST 25 12/08/2018 0750   AST 19 11/04/2018 0835   ALT 30 12/08/2018 0750   ALT 19 11/04/2018 0835   ALKPHOS 129 (H) 12/08/2018 0750   BILITOT 0.5 12/08/2018 0750   BILITOT 0.8 11/04/2018 0835   GFRNONAA >60 12/08/2018 0750   GFRNONAA >60 11/04/2018 0835   GFRNONAA 98 07/08/2017 1127   GFRAA >60 12/08/2018 0750   GFRAA >60 11/04/2018 0835   GFRAA 114 07/08/2017 1127    No results found for: TOTALPROTELP, ALBUMINELP, A1GS, A2GS, BETS, BETA2SER, GAMS, MSPIKE, SPEI  No results found for: KPAFRELGTCHN, LAMBDASER, KAPLAMBRATIO  Lab Results  Component Value Date   WBC 7.0 12/08/2018   NEUTROABS 4.6 12/08/2018   HGB 13.2 12/08/2018   HCT 39.5 12/08/2018   MCV 84.6 12/08/2018   PLT 323 12/08/2018    _0 @  No results found for: LABCA2  No components found for: VHQION629  No results for input(s): INR in the last 168 hours.  No results found for: LABCA2  No results found for: BMW413  No results found for: KGM010  No results found for: UVO536  No results found for: CA2729  No components found for: HGQUANT  No results found for: CEA1 / No results found for: CEA1   No results found for: AFPTUMOR  No results found for: CHROMOGRNA  No results found for: PSA1  Appointment on 12/08/2018  Component Date Value Ref Range Status   WBC 12/08/2018 7.0  4.0 - 10.5  K/uL Final   RBC 12/08/2018 4.67  3.87 - 5.11 MIL/uL Final   Hemoglobin 12/08/2018 13.2  12.0 - 15.0 g/dL Final   HCT 12/08/2018 39.5  36.0 - 46.0 % Final   MCV 12/08/2018 84.6  80.0 - 100.0 fL Final   MCH 12/08/2018 28.3  26.0  - 34.0 pg Final   MCHC 12/08/2018 33.4  30.0 - 36.0 g/dL Final   RDW 12/08/2018 13.2  11.5 - 15.5 % Final   Platelets 12/08/2018 323  150 - 400 K/uL Final   nRBC 12/08/2018 0.0  0.0 - 0.2 % Final   Neutrophils Relative % 12/08/2018 66  % Final   Neutro Abs 12/08/2018 4.6  1.7 - 7.7 K/uL Final   Lymphocytes Relative 12/08/2018 21  % Final   Lymphs Abs 12/08/2018 1.5  0.7 - 4.0 K/uL Final   Monocytes Relative 12/08/2018 11  % Final   Monocytes Absolute 12/08/2018 0.8  0.1 - 1.0 K/uL Final   Eosinophils Relative 12/08/2018 1  % Final   Eosinophils Absolute 12/08/2018 0.1  0.0 - 0.5 K/uL Final   Basophils Relative 12/08/2018 1  % Final   Basophils Absolute 12/08/2018 0.1  0.0 - 0.1 K/uL Final   Immature Granulocytes 12/08/2018 0  % Final   Abs Immature Granulocytes 12/08/2018 0.02  0.00 - 0.07 K/uL Final   Performed at [facility], [place] [address] [place], [place] [address]   Sodium 12/08/2018 142  135 - 145 mmol/L Final   Potassium 12/08/2018 3.1* 3.5 - 5.1 mmol/L Final   Chloride 12/08/2018 103  98 - 111 mmol/L Final   CO2 12/08/2018 28  22 - 32 mmol/L Final   Glucose, Bld 12/08/2018 87  70 - 99 mg/dL Final   BUN 12/08/2018 16  6 - 20 mg/dL Final   Creatinine, [name] 12/08/2018 0.74  0.44 - 1.00 mg/dL Final   Calcium 12/08/2018 9.5  8.9 - 10.3 mg/dL Final   Total Protein 12/08/2018 6.4* 6.5 - 8.1 g/dL Final   Albumin 12/08/2018 3.9  3.5 - 5.0 g/dL Final   AST 12/08/2018 25  15 - 41 U/L Final   ALT 12/08/2018 30  0 - 44 U/L Final   Alkaline Phosphatase 12/08/2018 129* 38 - 126 U/L Final   Total Bilirubin 12/08/2018 0.5  0.3 - 1.2 mg/dL Final   GFR calc non Af Amer 12/08/2018 >60  >60 mL/min Final   GFR calc Af Amer 12/08/2018 >60  >60 mL/min Final   Anion gap 12/08/2018 11  5 - 15 Final   Performed at [facility], [place] [address] [place], [place] [address]    (this displays the last labs from the last  3 days)  No results found for: TOTALPROTELP, ALBUMINELP, A1GS, A2GS, BETS, BETA2SER, GAMS, MSPIKE, SPEI (this displays SPEP labs)  No results found for: KPAFRELGTCHN, LAMBDASER, KAPLAMBRATIO (kappa/lambda light chains)  No results found for: HGBA, HGBA2QUANT, HGBFQUANT, HGBSQUAN (Hemoglobinopathy evaluation)   No results found for: LDH  Lab Results  Component Value Date   IRON 46 05/11/2013   TIBC 254 05/11/2013   IRONPCTSAT 18 (L) 05/11/2013   (Iron and TIBC)  Lab Results  Component Value Date   FERRITIN 72 05/11/2013    Urinalysis    Component Value Date/Time   BILIRUBINUR Small 11/09/2018 0914   PROTEINUR Negative 11/09/2018 0914   UROBILINOGEN 0.2 11/09/2018 0914   NITRITE Negative 11/09/2018 0914   LEUKOCYTESUR Negative 11/09/2018 0914     STUDIES:  Ct Chest W Contrast  Result Date: 11/09/2018 CLINICAL DATA:  Breast cancer EXAM: CT CHEST WITH CONTRAST TECHNIQUE: Multidetector CT imaging of the chest was performed during intravenous contrast administration. CONTRAST:  61m OMNIPAQUE IOHEXOL 300 MG/ML  SOLN COMPARISON:  None. FINDINGS: Cardiovascular: The heart is normal in size. No pericardial effusion. No evidence of thoracic aortic aneurysm. Mild coronary atherosclerosis of the LAD. Mediastinum/Nodes: No suspicious mediastinal or hilar lymphadenopathy. 13 mm short axis right axillary node (series 2/image 35), suspicious for nodal metastasis. Additional 7 mm short axis right subpectoral node (series 2/image 27), suspicious given the additional findings. Visualized thyroid is notable for subcentimeter nodules bilaterally. Lungs/Pleura: No suspicious pulmonary nodules. No focal consolidation. No pleural effusions or pneumothorax. Upper Abdomen: Visualized upper abdomen is grossly unremarkable. Musculoskeletal: 2.9 x 2.9 cm soft tissue mass in the medial right breast (series 2/image 66), corresponding to the patient's known primary breast neoplasm. Mild degenerative changes  of the visualized thoracic spine. IMPRESSION: 2.9 cm soft tissue mass in the medial right breast, corresponding to the patient's known primary breast neoplasm. Suspected right axillary and subpectoral nodal metastases, as above. Electronically Signed   By: SJulian HyM.D.   On: 11/09/2018 17:44   Ct Abdomen Pelvis W Contrast  Result Date: 11/10/2018 CLINICAL DATA:  Right lower quadrant abdominal pain for 5 days. History of right breast cancer. EXAM: CT ABDOMEN AND PELVIS WITH CONTRAST TECHNIQUE: Multidetector CT imaging of the abdomen and pelvis was performed using the standard protocol following bolus administration of intravenous contrast. CONTRAST:  1027mOMNIPAQUE IOHEXOL 300 MG/ML SOLN, 3017mMNIPAQUE IOHEXOL 300 MG/ML SOLN COMPARISON:  None. FINDINGS: Lower chest: Small pericardial effusion with maximum diameter of 12 mm at the base of the heart. Hepatobiliary: 5 mm hypoattenuated nodule in the medial segment of the right lobe of the liver. 2-3 mm hypoattenuated nodule in the dome of the liver. Post cholecystectomy. No evidence of biliary ductal dilation. Pancreas: Unremarkable. No pancreatic ductal dilatation or surrounding inflammatory changes. Spleen: Normal in size without focal abnormality. Adrenals/Urinary Tract: Adrenal glands are unremarkable. Kidneys are normal, without renal calculi, focal lesion, or hydronephrosis. There is a masslike thickening of the anterior base of the urinary bladder measuring 2.9 x 2.6 cm, image 77/87, sequence 2. Stomach/Bowel: Stomach is within normal limits. No evidence of bowel wall small bowel thickening, distention, or inflammatory changes. No evidence of appendicitis. Mild nonspecific circumferential mucosal thickening of the sigmoid colon and rectum, nonspecific, possibly inflammatory. Vascular/Lymphatic: No significant vascular findings are present. No enlarged abdominal or pelvic lymph nodes. Reproductive: Status post hysterectomy. Mild prominence of the  bilateral adnexa. Other: Small amount of free fluid in the pelvis. Musculoskeletal: No suspicious osseous lesions. Posterior facet arthropathy of the lower lumbosacral spine. IMPRESSION: 1. Small pericardial effusion. 2. Two less than 5 mm hypoattenuated liver nodules, too small to be accurately characterized by CT. 3. Masslike thickening of the anterior base of the urinary bladder. This may represent an area of of prominence of the proximal urethra, however bladder mass cannot be excluded. This finding may be further evaluated with MRI of the pelvis, or direct visualization. 4. Mild prominence of the bilateral adnexa. If MRI of the pelvis is not performed, evaluation with pelvic ultrasound may be considered. 5. Nonspecific, probably inflammatory or infectious thickening of sigmoid colon and rectum. 6. Small amount of free fluid in the pelvis. Electronically Signed   By: DobFidela SalisburyD.   On: 11/10/2018 18:28   Mr Breast Bilateral W WGentry  Cad  Result Date: 11/10/2018 CLINICAL DATA:  58 year old female with biopsy proven malignancy of the right breast identified initially on screening mammography performed 10/21/2018. She had ultrasound-guided biopsy performed of the right breast mass and a right axillary lymph node on 10/28/2018 revealing grade 2 invasive ductal carcinoma in the right breast at 1 o'clock and tumor cells within fragmented nodal tissue in the right axilla. She has history of prior benign bilateral excisional biopsies. LABS:  Creatinine of 0.79 mg/dL and GFR greater than 60 on 11/04/2018. EXAM: BILATERAL BREAST MRI WITH AND WITHOUT CONTRAST TECHNIQUE: Multiplanar, multisequence MR images of both breasts were obtained prior to and following the intravenous administration of 8 ml of Gadavist. Three-dimensional MR images were rendered by post-processing of the original MR data on an independent workstation. The three-dimensional MR images were interpreted, and findings are reported  in the following complete MRI report for this study. Three dimensional images were evaluated at the independent DynaCad workstation. COMPARISON:  No prior MRI available for comparison. Correlation made with prior mammograms and ultrasounds. FINDINGS: Breast composition: c. Heterogeneous fibroglandular tissue. Background parenchymal enhancement: Minimal. Right breast: In the central superior slightly medial right breast, there is a lobulated intensely enhancing mass measuring 2.8 x 2.5 x 2.1 cm. Susceptibility artifact is seen in the anteromedial aspect of the mass compatible with the biopsy marking clip. There are 2 small nonenhancing to minimally enhancing masses, one in the upper-outer middle depth of the right breast, and another in the lower outer posterior depth of the right breast. Both of these have been mammographically stable for several years and are benign. Left breast: No suspicious mass or abnormal enhancement. There are 3 nonenhancing to minimally enhancing masses in the central to central inferior left breast. These have been mammographically stable over several years and are benign. Lymph nodes: There is one enlarged right axillary lymph node. Susceptibility artifact from the biopsy marking clip is seen within the lymph node corresponding with the known lymph node with tumor cells. No abnormal appearing lymph nodes in the left axilla. Ancillary findings:  None. IMPRESSION: 1. There is a 2.8 cm biopsy proven malignant mass in the upper inner right breast. No evidence of additional sites of disease in the right breast. 2. There is a biopsy proven metastatic lymph node in the right axilla. No additional abnormal lymph nodes are identified. 3.  No evidence of left breast malignancy. RECOMMENDATION: Continue treatment plan for known right breast cancer. BI-RADS CATEGORY  6: Known biopsy-proven malignancy. Electronically Signed   By: Ammie Ferrier M.D.   On: 11/10/2018 11:57   [place] Fna Bx Thyroid 1st  Lesion Afirma  Result Date: 11/19/2018 INDICATION: 59 year old female with a history breast carcinoma and thyroid ultrasound performed 11/06/2018 demonstrating right thyroid nodule with suspicious characteristics EXAM: ULTRASOUND GUIDED FINE NEEDLE ASPIRATION OF INDETERMINATE THYROID NODULE ULTRASOUND-GUIDED BIOPSY OF LYMPH NODE COMPARISON:  11/06/2018 MEDICATIONS: None COMPLICATIONS: None TECHNIQUE: Informed written consent was obtained from the patient after a discussion of the risks, benefits and alternatives to treatment. Questions regarding the procedure were encouraged and answered. A timeout was performed prior to the initiation of the procedure. Pre-procedural ultrasound scanning demonstrated unchanged size and appearance of the indeterminate nodule within the right inferior thyroid The procedure was planned. The neck was prepped in the usual sterile fashion, and a sterile drape was applied covering the operative field. A timeout was performed prior to the initiation of the procedure. Local anesthesia was provided with 1% lidocaine. Under direct ultrasound guidance, 3  FNA biopsies were performed of the right inferior with a 25 gauge needle. Two additional biopsy performed for Afirma testing. Multiple ultrasound images were saved for procedural documentation purposes. The samples were prepared and submitted to pathology. Ultrasound survey was performed of the inferior extrathyroid nodule with images stored and sent to PACs. Ultrasound guidance was used to infiltrate this more inferior region with 1% lidocaine for local anesthesia. Three separate 25 gauge fine needle biopsy were then acquired of the extrathyroid nodule using ultrasound guidance. Images were stored. Slide preparation was performed for the thyroid nodule and the extrathyroid nodule. Final image was stored after biopsy. Patient tolerated the procedure well and remained hemodynamically stable throughout. No complications were encountered and no  significant blood loss was encounter FINDINGS: Nodule reference number based on prior diagnostic ultrasound: 2 Maximum size: 2.1 cm Location: Right; Inferior ACR TI-RADS risk category: TR4 (4-6 points) Reason for biopsy: meets ACR TI-RADS criteria Ultrasound imaging confirms appropriate placement of the needles within the thyroid nodule. IMPRESSION: Status post ultrasound-guided biopsy of right thyroid nodule. Status post ultrasound-guided biopsy of right extrathyroid nodule. Signed, Dulcy Fanny. Dellia Nims, RPVI [facility] Radiology Electronically Signed   By: Corrie Mckusick D.O.   On: 11/19/2018 15:23   Dg Chest Port 1 View  Result Date: 11/16/2018 CLINICAL DATA:  Status post Port-A-Cath placement today. EXAM: PORTABLE CHEST 1 VIEW COMPARISON:  CT chest 11/09/2018. FINDINGS: New left subclavian approach Port-A-Cath is in place with the tip projecting in the lower superior vena cava. No pneumothorax. Lungs are clear. Heart size is normal. No acute or focal bony abnormality. IMPRESSION: Left subclavian Port-A-Cath tip projects in the lower superior vena cava. Negative for pneumothorax. Electronically Signed   By: Inge Rise M.D.   On: 11/16/2018 09:24   Dg C-arm 1-60 Min-no Report  Result Date: 11/16/2018 Fluoroscopy was utilized by the requesting physician.  No radiographic interpretation.   [place] Fna Bx Thyroid Ea Add Lesion Afirma  Result Date: 11/19/2018 INDICATION: 58 year old female with a history breast carcinoma and thyroid ultrasound performed 11/06/2018 demonstrating right thyroid nodule with suspicious characteristics EXAM: ULTRASOUND GUIDED FINE NEEDLE ASPIRATION OF INDETERMINATE THYROID NODULE ULTRASOUND-GUIDED BIOPSY OF LYMPH NODE COMPARISON:  11/06/2018 MEDICATIONS: None COMPLICATIONS: None TECHNIQUE: Informed written consent was obtained from the patient after a discussion of the risks, benefits and alternatives to treatment. Questions  regarding the procedure were encouraged and answered. A timeout was performed prior to the initiation of the procedure. Pre-procedural ultrasound scanning demonstrated unchanged size and appearance of the indeterminate nodule within the right inferior thyroid The procedure was planned. The neck was prepped in the usual sterile fashion, and a sterile drape was applied covering the operative field. A timeout was performed prior to the initiation of the procedure. Local anesthesia was provided with 1% lidocaine. Under direct ultrasound guidance, 3 FNA biopsies were performed of the right inferior with a 25 gauge needle. Two additional biopsy performed for Afirma testing. Multiple ultrasound images were saved for procedural documentation purposes. The samples were prepared and submitted to pathology. Ultrasound survey was performed of the inferior extrathyroid nodule with images stored and sent to PACs. Ultrasound guidance was used to infiltrate this more inferior region with 1% lidocaine for local anesthesia. Three separate 25 gauge fine needle biopsy were then acquired of the extrathyroid nodule using ultrasound guidance. Images were stored. Slide preparation was performed for the thyroid nodule and the extrathyroid nodule. Final image was stored after biopsy. Patient tolerated the procedure  well and remained hemodynamically stable throughout. No complications were encountered and no significant blood loss was encounter FINDINGS: Nodule reference number based on prior diagnostic ultrasound: 2 Maximum size: 2.1 cm Location: Right; Inferior ACR TI-RADS risk category: TR4 (4-6 points) Reason for biopsy: meets ACR TI-RADS criteria Ultrasound imaging confirms appropriate placement of the needles within the thyroid nodule. IMPRESSION: Status post ultrasound-guided biopsy of right thyroid nodule. Status post ultrasound-guided biopsy of right extrathyroid nodule. Signed, Dulcy Fanny. Dellia Nims, RPVI [facility]  [facility] Radiology Electronically Signed   By: Corrie Mckusick D.O.   On: 11/19/2018 15:23     ELIGIBLE FOR AVAILABLE RESEARCH PROTOCOL: no   ASSESSMENT: 58 y.o. Debbie [name], Debbie [name] woman status post right breast upper inner quadrant biopsy 10/28/2018 for a clinical T2 N1, stage IIA invasive ductal carcinoma, grade 2, estrogen receptor positive, progesterone receptor negative, HER-2 amplified, with an MIB-1 of 20%  (a) staging work-up with CT scans of the chest abdomen and pelvis and thyroid ultrasonography shows multiple findings that will require follow-up:   (i) two 0.5 cm liver lesions   (ii) possible bladder mass, thickened sigmoid and rectum, and prominent adnexa   (iii) thyroid nodule biopsied 11/19/2018, read as [place] 3    (iiii)  thyroid lymph node biopsied 11/19/2018 showing no evidence of carcinoma  (1) neoadjuvant chemotherapy will consist of carboplatin, docetaxel, trastuzumab, and Pertuzumab every 21 days x 6 starting 11/17/2018  (2) trastuzumab will be continued to complete 1 year  (a) baseline echocardiogram 11/11/2018 showed an ejection fraction in the 55-60% range  (3) definitive surgery to follow  (4) adjuvant radiation as appropriate  (5) antiestrogens at the completion of local treatment  (6) referral thyroid surgeon at the completion of adjuvant radiation   PLAN: Vickii Chafe is proceeding to cycle 2 today.  We have made a few changes which I hope will make things better for her.  We have eliminated the Perjeta for now, we have added Cipro beginning day 7, which she will take for 5 days, we have added Diflucan which she will take daily from days 1 through 4, and of course she knows how to take pain medicine for the flulike symptoms from the immune booster shot  We are going to change her to OnPro today to avoid unnecessary visits given the pandemic.  From an emotional point of view she is doing much better, and has tolerated losing her hair  remarkably well  She will see me again with cycle #3, but she knows to call for any other issues that may develop before then  Allaya Abbasi, Virgie Dad, MD  12/08/18 8:39 AM Medical Oncology and Hematology [facility], [place] [address] Tel. [phone]    Fax. [phone]   I, Jacqualyn Posey am acting as a Education administrator for Chauncey Cruel, MD.   I, Lurline Del MD, have reviewed the above documentation for accuracy and completeness, and I agree with the above.

## 2018-12-08 ENCOUNTER — Other Ambulatory Visit: Payer: Self-pay | Admitting: *Deleted

## 2018-12-08 ENCOUNTER — Other Ambulatory Visit: Payer: Self-pay

## 2018-12-08 ENCOUNTER — Inpatient Hospital Stay: Payer: BLUE CROSS/BLUE SHIELD

## 2018-12-08 ENCOUNTER — Encounter: Payer: Self-pay | Admitting: Oncology

## 2018-12-08 ENCOUNTER — Other Ambulatory Visit: Payer: Self-pay | Admitting: Oncology

## 2018-12-08 ENCOUNTER — Telehealth: Payer: Self-pay | Admitting: Oncology

## 2018-12-08 ENCOUNTER — Encounter: Payer: Self-pay | Admitting: *Deleted

## 2018-12-08 ENCOUNTER — Inpatient Hospital Stay (HOSPITAL_BASED_OUTPATIENT_CLINIC_OR_DEPARTMENT_OTHER): Payer: BLUE CROSS/BLUE SHIELD | Admitting: Oncology

## 2018-12-08 VITALS — BP 125/73 | HR 74 | Temp 98.2°F | Resp 18 | Ht 66.0 in | Wt 179.3 lb

## 2018-12-08 DIAGNOSIS — C50211 Malignant neoplasm of upper-inner quadrant of right female breast: Secondary | ICD-10-CM

## 2018-12-08 DIAGNOSIS — Z79899 Other long term (current) drug therapy: Secondary | ICD-10-CM

## 2018-12-08 DIAGNOSIS — Z5111 Encounter for antineoplastic chemotherapy: Secondary | ICD-10-CM | POA: Diagnosis not present

## 2018-12-08 DIAGNOSIS — Z170 Estrogen receptor positive status [ER+]: Secondary | ICD-10-CM

## 2018-12-08 DIAGNOSIS — Z7689 Persons encountering health services in other specified circumstances: Secondary | ICD-10-CM | POA: Diagnosis not present

## 2018-12-08 DIAGNOSIS — Z95828 Presence of other vascular implants and grafts: Secondary | ICD-10-CM

## 2018-12-08 LAB — COMPREHENSIVE METABOLIC PANEL
ALT: 30 U/L (ref 0–44)
AST: 25 U/L (ref 15–41)
Albumin: 3.9 g/dL (ref 3.5–5.0)
Alkaline Phosphatase: 129 U/L — ABNORMAL HIGH (ref 38–126)
Anion gap: 11 (ref 5–15)
BUN: 16 mg/dL (ref 6–20)
CO2: 28 mmol/L (ref 22–32)
Calcium: 9.5 mg/dL (ref 8.9–10.3)
Chloride: 103 mmol/L (ref 98–111)
Creatinine, Ser: 0.74 mg/dL (ref 0.44–1.00)
GFR calc Af Amer: >60 mL/min (ref >60)
GFR calc non Af Amer: >60 mL/min (ref >60)
Glucose, Bld: 87 mg/dL (ref 70–99)
Potassium: 3.1 mmol/L — ABNORMAL LOW (ref 3.5–5.1)
Sodium: 142 mmol/L (ref 135–145)
Total Bilirubin: 0.5 mg/dL (ref 0.3–1.2)
Total Protein: 6.4 g/dL — ABNORMAL LOW (ref 6.5–8.1)

## 2018-12-08 LAB — CBC WITH DIFFERENTIAL/PLATELET
Abs Immature Granulocytes: 0.02 10*3/uL (ref 0.00–0.07)
Basophils Absolute: 0.1 10*3/uL (ref 0.0–0.1)
Basophils Relative: 1 %
Eosinophils Absolute: 0.1 10*3/uL (ref 0.0–0.5)
Eosinophils Relative: 1 %
HCT: 39.5 % (ref 36.0–46.0)
Hemoglobin: 13.2 g/dL (ref 12.0–15.0)
Immature Granulocytes: 0 %
Lymphocytes Relative: 21 %
Lymphs Abs: 1.5 10*3/uL (ref 0.7–4.0)
MCH: 28.3 pg (ref 26.0–34.0)
MCHC: 33.4 g/dL (ref 30.0–36.0)
MCV: 84.6 fL (ref 80.0–100.0)
Monocytes Absolute: 0.8 10*3/uL (ref 0.1–1.0)
Monocytes Relative: 11 %
Neutro Abs: 4.6 10*3/uL (ref 1.7–7.7)
Neutrophils Relative %: 66 %
Platelets: 323 10*3/uL (ref 150–400)
RBC: 4.67 MIL/uL (ref 3.87–5.11)
RDW: 13.2 % (ref 11.5–15.5)
WBC: 7 10*3/uL (ref 4.0–10.5)
nRBC: 0 % (ref 0.0–0.2)

## 2018-12-08 MED ORDER — ACETAMINOPHEN 325 MG PO TABS
ORAL_TABLET | ORAL | Status: AC
  Filled 2018-12-08: qty 2

## 2018-12-08 MED ORDER — PEGFILGRASTIM 6 MG/0.6ML ~~LOC~~ PSKT
6.0000 mg | PREFILLED_SYRINGE | Freq: Once | SUBCUTANEOUS | Status: AC
  Administered 2018-12-08: 12:00:00 6 mg via SUBCUTANEOUS

## 2018-12-08 MED ORDER — CIPROFLOXACIN HCL 500 MG PO TABS: ORAL_TABLET | ORAL | 0 refills | Status: DC

## 2018-12-08 MED ORDER — PALONOSETRON HCL INJECTION 0.25 MG/5ML
INTRAVENOUS | Status: AC
  Filled 2018-12-08: qty 5

## 2018-12-08 MED ORDER — FLUCONAZOLE 100 MG PO TABS: ORAL_TABLET | ORAL | 0 refills | Status: DC

## 2018-12-08 MED ORDER — SODIUM CHLORIDE 0.9 % IV SOLN
Freq: Once | INTRAVENOUS | Status: AC
  Administered 2018-12-08: 09:00:00 via INTRAVENOUS
  Filled 2018-12-08: qty 250

## 2018-12-08 MED ORDER — PEGFILGRASTIM 6 MG/0.6ML ~~LOC~~ PSKT
PREFILLED_SYRINGE | SUBCUTANEOUS | Status: AC
  Filled 2018-12-08: qty 0.6

## 2018-12-08 MED ORDER — SODIUM CHLORIDE 0.9% FLUSH
10.0000 mL | INTRAVENOUS | Status: DC | PRN
  Administered 2018-12-08: 08:00:00 10 mL via INTRAVENOUS
  Filled 2018-12-08: qty 10

## 2018-12-08 MED ORDER — SODIUM CHLORIDE 0.9 % IV SOLN
Freq: Once | INTRAVENOUS | Status: AC
  Administered 2018-12-08: 09:00:00 via INTRAVENOUS
  Filled 2018-12-08: qty 5

## 2018-12-08 MED ORDER — SODIUM CHLORIDE 0.9 % IV SOLN
634.0000 mg | Freq: Once | INTRAVENOUS | Status: AC
  Administered 2018-12-08: 630 mg via INTRAVENOUS
  Filled 2018-12-08: qty 63

## 2018-12-08 MED ORDER — POTASSIUM CHLORIDE ER 10 MEQ PO CPCR: 10.0000 meq | ORAL_CAPSULE | Freq: Every day | ORAL | 1 refills | Status: DC

## 2018-12-08 MED ORDER — TRASTUZUMAB CHEMO 150 MG IV SOLR
450.0000 mg | Freq: Once | INTRAVENOUS | Status: AC
  Administered 2018-12-08: 450 mg via INTRAVENOUS
  Filled 2018-12-08: qty 21.43

## 2018-12-08 MED ORDER — HEPARIN SOD (PORK) LOCK FLUSH 100 UNIT/ML IV SOLN
500.0000 [IU] | Freq: Once | INTRAVENOUS | Status: AC | PRN
  Administered 2018-12-08: 14:00:00 500 [IU]
  Filled 2018-12-08: qty 5

## 2018-12-08 MED ORDER — SODIUM CHLORIDE 0.9 % IV SOLN
75.0000 mg/m2 | Freq: Once | INTRAVENOUS | Status: AC
  Administered 2018-12-08: 10:00:00 150 mg via INTRAVENOUS
  Filled 2018-12-08: qty 15

## 2018-12-08 MED ORDER — PALONOSETRON HCL INJECTION 0.25 MG/5ML
0.2500 mg | Freq: Once | INTRAVENOUS | Status: AC
  Administered 2018-12-08: 09:00:00 0.25 mg via INTRAVENOUS

## 2018-12-08 MED ORDER — DIPHENHYDRAMINE HCL 25 MG PO CAPS
ORAL_CAPSULE | ORAL | Status: AC
  Filled 2018-12-08: qty 1

## 2018-12-08 MED ORDER — DIPHENHYDRAMINE HCL 25 MG PO CAPS
25.0000 mg | ORAL_CAPSULE | Freq: Once | ORAL | Status: AC
  Administered 2018-12-08: 09:00:00 25 mg via ORAL

## 2018-12-08 MED ORDER — POTASSIUM CHLORIDE 10 MEQ/100ML IV SOLN
10.0000 meq | Freq: Once | INTRAVENOUS | Status: AC
  Administered 2018-12-08: 10 meq via INTRAVENOUS
  Filled 2018-12-08: qty 100

## 2018-12-08 MED ORDER — SODIUM CHLORIDE 0.9% FLUSH
10.0000 mL | INTRAVENOUS | Status: DC | PRN
  Administered 2018-12-08: 14:00:00 10 mL
  Filled 2018-12-08: qty 10

## 2018-12-08 MED ORDER — ACETAMINOPHEN 325 MG PO TABS
650.0000 mg | ORAL_TABLET | Freq: Once | ORAL | Status: AC
  Administered 2018-12-08: 09:00:00 650 mg via ORAL

## 2018-12-08 NOTE — Progress Notes (Signed)
Per Dr Jana Hakim pt to receive IV K in tx area today and a Rx for home

## 2018-12-08 NOTE — Patient Instructions (Addendum)
[facility] Discharge Instructions for Patients Receiving Chemotherapy   ONPRO will start injecting at 3:30 Wednesday Take ONPRO off at 4:30 pm Wednesday    Today you received the following chemotherapy agents Herceptin, Taxotere, Carboplatin  To help prevent nausea and vomiting after your treatment, we encourage you to take your nausea medication as prescribed.  If you develop nausea and vomiting that is not controlled by your nausea medication, call the clinic.   BELOW ARE SYMPTOMS THAT SHOULD BE REPORTED IMMEDIATELY:  *FEVER GREATER THAN 100.5 F  *CHILLS WITH OR WITHOUT FEVER  NAUSEA AND VOMITING THAT IS NOT CONTROLLED WITH YOUR NAUSEA MEDICATION  *UNUSUAL SHORTNESS OF BREATH  *UNUSUAL BRUISING OR BLEEDING  TENDERNESS IN MOUTH AND THROAT WITH OR WITHOUT PRESENCE OF ULCERS  *URINARY PROBLEMS  *BOWEL PROBLEMS  UNUSUAL RASH Items with * indicate a potential emergency and should be followed up as soon as possible.  Feel free to call the clinic should you have any questions or concerns. The clinic phone number is [phone].  Please show the [facility] at check-in to the Emergency Department and triage nurse.    Hypokalemia Hypokalemia means that the amount of potassium in the blood is lower than normal.Potassium is a chemical that helps regulate the amount of fluid in the body (electrolyte). It also stimulates muscle tightening (contraction) and helps nerves work properly.Normally, most of the body's potassium is inside of cells, and only a very small amount is in the blood. Because the amount in the blood is so small, minor changes to potassium levels in the blood can be life-threatening. What are the causes? This condition may be caused by:  Antibiotic medicine.  Diarrhea or vomiting. Taking too much of a medicine that helps you have a bowel movement (laxative) can cause diarrhea and lead to hypokalemia.  Chronic kidney disease (CKD).   Medicines that help the body get rid of excess fluid (diuretics).  Eating disorders, such as bulimia.  Low magnesium levels in the body.  Sweating a lot. What are the signs or symptoms? Symptoms of this condition include:  Weakness.  Constipation.  Fatigue.  Muscle cramps.  Mental confusion.  Skipped heartbeats or irregular heartbeat (palpitations).  Tingling or numbness. How is this diagnosed? This condition is diagnosed with a blood test. How is this treated? Hypokalemia can be treated by taking potassium supplements by mouth or adjusting the medicines that you take. Treatment may also include eating more foods that contain a lot of potassium. If your potassium level is very low, you may need to get potassium through an IV tube in one of your veins and be monitored in the hospital. Follow these instructions at home:   Take over-the-counter and prescription medicines only as told by your health care provider. This includes vitamins and supplements.  Eat a healthy diet. A healthy diet includes fresh fruits and vegetables, whole grains, healthy fats, and lean proteins.  If instructed, eat more foods that contain a lot of potassium, such as: ? Nuts, such as peanuts and pistachios. ? Seeds, such as sunflower seeds and pumpkin seeds. ? Peas, lentils, and lima beans. ? Whole grain and bran cereals and breads. ? Fresh fruits and vegetables, such as apricots, avocado, bananas, cantaloupe, kiwi, oranges, tomatoes, asparagus, and potatoes. ? Orange juice. ? Tomato juice. ? Red meats. ? Yogurt.  Keep all follow-up visits as told by your health care provider. This is important. Contact a health care provider if:  You have weakness  that gets worse.  You feel your heart pounding or racing.  You vomit.  You have diarrhea.  You have diabetes (diabetes mellitus) and you have trouble keeping your blood sugar (glucose) in your target range. Get help right away if:  You have  chest pain.  You have shortness of breath.  You have vomiting or diarrhea that lasts for more than 2 days.  You faint. This information is not intended to replace advice given to you by your health care provider. Make sure you discuss any questions you have with your health care provider. Document Released: 08/05/2005 Document Revised: 03/23/2016 Document Reviewed: 03/23/2016 Elsevier Interactive Patient Education  2019 [place] (COVID-19) Are you at risk?  Are you at risk for the Coronavirus (COVID-19)?  To be considered HIGH RISK for Coronavirus (COVID-19), you have to meet the following criteria:  . Traveled to [place], [place], [place], [place] or [place]; or in the [place] to [place], [place], [place], or [place]; and have fever, cough, and shortness of breath within the last 2 weeks of travel OR . Been in close contact with a person diagnosed with COVID-19 within the last 2 weeks and have fever, cough, and shortness of breath . IF YOU DO NOT MEET THESE CRITERIA, YOU ARE CONSIDERED LOW RISK FOR COVID-19.  What to do if you are HIGH RISK for COVID-19?  Marland Kitchen If you are having a medical emergency, call 911. . Seek medical care right away. Before you go to a doctor's office, urgent care or emergency department, call ahead and tell them about your recent travel, contact with someone diagnosed with COVID-19, and your symptoms. You should receive instructions from your physician's office regarding next steps of care.  . When you arrive at healthcare provider, tell the healthcare staff immediately you have returned from visiting [place], [place], [place], [place] or [place]; or traveled in the [place] to [place], [place], [place], or [place]; in the last two weeks or you have been in close contact with a person diagnosed with COVID-19 in the last 2 weeks.   . Tell the health care staff about your symptoms: fever, cough and shortness of breath. . After  you have been seen by a medical provider, you will be either: o Tested for (COVID-19) and discharged home on quarantine except to seek medical care if symptoms worsen, and asked to  - Stay home and avoid contact with others until you get your results (4-5 days)  - Avoid travel on public transportation if possible (such as bus, train, or airplane) or o Sent to the Emergency Department by EMS for evaluation, COVID-19 testing, and possible admission depending on your condition and test results.  What to do if you are LOW RISK for COVID-19?  Reduce your risk of any infection by using the same precautions used for avoiding the common cold or flu:  Marland Kitchen Wash your hands often with soap and warm water for at least 20 seconds.  If soap and water are not readily available, use an alcohol-based hand sanitizer with at least 60% alcohol.  . If coughing or sneezing, cover your mouth and nose by coughing or sneezing into the elbow areas of your shirt or coat, into a tissue or into your sleeve (not your hands). . Avoid shaking hands with others and consider head nods or verbal greetings only. . Avoid touching your eyes, nose, or mouth with unwashed hands.  . Avoid close contact with people  who are sick. . Avoid places or events with large numbers of people in one location, like concerts or sporting events. . Carefully consider travel plans you have or are making. . If you are planning any travel outside or inside the [place], visit the CDC's Travelers' Health webpage for the latest health notices. . If you have some symptoms but not all symptoms, continue to monitor at home and seek medical attention if your symptoms worsen. . If you are having a medical emergency, call 911.   [place] / e-Visit: [email or web address]         [facility]: [facility]: [phone]                    [facility]: [phone]

## 2018-12-08 NOTE — Telephone Encounter (Signed)
Cancelled 4/23 injection appt per sch msg. Patient already notified

## 2018-12-08 NOTE — Patient Instructions (Signed)

## 2018-12-10 ENCOUNTER — Ambulatory Visit: Payer: BLUE CROSS/BLUE SHIELD

## 2018-12-15 DIAGNOSIS — G4733 Obstructive sleep apnea (adult) (pediatric): Secondary | ICD-10-CM | POA: Diagnosis not present

## 2018-12-24 ENCOUNTER — Other Ambulatory Visit: Payer: Self-pay | Admitting: Oncology

## 2018-12-24 ENCOUNTER — Other Ambulatory Visit: Payer: Self-pay

## 2018-12-24 ENCOUNTER — Ambulatory Visit (HOSPITAL_COMMUNITY)
Admission: RE | Admit: 2018-12-24 | Discharge: 2018-12-24 | Disposition: A | Discharge status: Home / Self Care | Payer: BLUE CROSS/BLUE SHIELD | Source: Ambulatory Visit | Attending: Oncology | Admitting: Oncology

## 2018-12-24 DIAGNOSIS — K769 Liver disease, unspecified: Secondary | ICD-10-CM

## 2018-12-24 DIAGNOSIS — K639 Disease of intestine, unspecified: Secondary | ICD-10-CM

## 2018-12-24 DIAGNOSIS — C50211 Malignant neoplasm of upper-inner quadrant of right female breast: Secondary | ICD-10-CM

## 2018-12-24 DIAGNOSIS — Z170 Estrogen receptor positive status [ER+]: Secondary | ICD-10-CM

## 2018-12-24 DIAGNOSIS — N3289 Other specified disorders of bladder: Secondary | ICD-10-CM | POA: Diagnosis not present

## 2018-12-24 DIAGNOSIS — K838 Other specified diseases of biliary tract: Secondary | ICD-10-CM | POA: Diagnosis not present

## 2018-12-24 DIAGNOSIS — D398 Neoplasm of uncertain behavior of other specified female genital organs: Secondary | ICD-10-CM | POA: Diagnosis not present

## 2018-12-24 DIAGNOSIS — K7689 Other specified diseases of liver: Secondary | ICD-10-CM | POA: Diagnosis not present

## 2018-12-24 MED ORDER — GADOBUTROL 1 MMOL/ML IV SOLN
10.0000 mL | Freq: Once | INTRAVENOUS | Status: AC | PRN
  Administered 2018-12-24: 13:00:00 10 mL via INTRAVENOUS

## 2018-12-24 NOTE — Progress Notes (Signed)
I called patient and left her message to I do not see anything "worrisome" and MRI.  We will discuss at more length when she returns to see me next week

## 2018-12-28 ENCOUNTER — Other Ambulatory Visit: Payer: BLUE CROSS/BLUE SHIELD

## 2018-12-28 NOTE — Progress Notes (Signed)
Debbie [name]  [phone] [phone]    ID: Debbie [name] DOB: 02/07/1961  MR#: [id number]  DEY#:[id number]  Patient Care Team: Lavada Mesi as PCP - General (Family Medicine) Mauro Kaufmann, RN as Oncology Nurse Navigator Rockwell Germany, RN as Oncology Nurse Navigator Alphonsa Overall, MD as Consulting Physician (General Surgery) Zimir Kittleson, Virgie Dad, MD as Consulting Physician (Oncology) Gery Pray, MD as Consulting Physician (Radiation Oncology) OTHER MD:   CHIEF COMPLAINT: HER-2 positive breast cancer  CURRENT TREATMENT: Neoadjuvant chemotherapy   INTERVAL HISTORY: Debbie [name] returns today for follow-up and treatment of her HER-2 positive breast cancer.   She continues on neoadjuvant chemotherapy consisting of carboplatin, docetaxel, trastuzumab, and Pertuzumab every 21 days x 6. We have eliminated the Pertuzumab from cycles 2 and 3 because of significant diarrhea problems she had previously.  Today is day 1 cycle 3. She notes feeling better without the Pertuzumab, with some diarrhea for the first 3 days following chemo. The most bowel movements she had in one day was 4, and she took imodium. She reports a rash to her lower abdomen and altered taste. She also reports fatigue that improves with each week following. She states she can not palpate anything in her breast at this point.  Debbie [name]'s last echocardiogram on 11/11/2018, showed an ejection fraction in the 55% - 60% range.   Since her last visit here, she underwent abdomen/pelvis and liver MRI on 12/24/2018. Results showed: very tiny T2 hyperintensities in the anterior right liver and medial segment left liver are not well evaluated given mild motion degradation and tiny size of the lesions, probably benign, were well seen on previous CT; normal ovaries bilaterally; some soft tissue fullness in the region of the vesicourethral junction, but a discrete bladder wall mass is not identified;  common bile duct measures 9 mm diameter in the head of pancreas, mildly distended.   REVIEW OF SYSTEMS: Debbie [name] reports doing well overall. She states she walks for exercise and has weights that she uses occasionally. The patient denies unusual headaches, visual changes, nausea, vomiting, stiff neck, dizziness, or gait imbalance. There has been no cough, phlegm production, or pleurisy, no chest pain or pressure, and no change in bowel or bladder habits. The patient denies fever, rash, bleeding, unexplained fatigue or unexplained weight loss. A detailed review of systems was otherwise entirely negative.   HISTORY OF CURRENT ILLNESS: From the original intake note:  [place] had routine screening mammography on 10/21/2018 showing a possible abnormality in the right breast. She felt a lump in her breast, possibly dating back to 01/2018, but she didn't think anything about it due to her history with fibrous breasts; she has had fibrous breasts all her life and began to have mammograms at the age of 32.   She underwent right breast ultrasonography at [facility] on 10/28/2018 showing: On physical exam, a firm, fixed mass is palpated in the superior right breast. Targeted ultrasound is performed, showing an irregular hypoechoic mass at the 1 o'clock position 4 cm from the nipple. It measures 2.8 x 2.4 x 1.8 cm. There is associated peripheral and internal vascularity. An oval, circumscribed hypoechoic mass is also identified within the vicinity at the 1 o'clock position 4 cm from the nipple. It measures 1.0 x 0.9 x 0.4 cm. This corresponds with an additional mammographically identified mass in the upper inner quadrant at middle depth. This is mammographically stable dating back to at least 2014. Additional stable, circumscribed  masses are scattered throughout the bilateral breasts mammographically. Evaluation of the right axilla demonstrates a single, markedly abnormal lymph node with cortical  thickening up to 1 cm. No additional suspicious lymphadenopathy is identified.  Accordingly on 10/28/2018 she proceeded to biopsy of the right breast area in question. The pathology from this procedure showed ([id number]): invasive ductal carcinoma, nottingham grade II of III, 2.8 cm, 1 o'clock, 4.0 cm from the nipple. Prognostic indicators significant for: estrogen receptor, 90% positive, with strong staining intensity and progesterone receptor, 0% negative. Proliferation marker Ki67 at 20%. HER2 Positive (3+) by immunohistochemistry.   An additional biopsy of the right axilla was performed on the same day ([id number]) showing:  2. Lymph node, needle/core biopsy, inferior right axilla - Tumor cells within fragmented nodal tissue  The patient's subsequent history is as detailed above.   PAST MEDICAL HISTORY: Past Medical History:  Diagnosis Date   Anxiety    Asthma    with severe URIs   Bronchitis    Essential hypertension, benign 12/01/2012   Generalized anxiety disorder 12/01/2012   Hypertension    Irritable bowel syndrome (IBS)    "had for years," per patient   PONV (postoperative nausea and vomiting)    Seasonal allergies    Sleep apnea      PAST SURGICAL HISTORY: Past Surgical History:  Procedure Laterality Date   ABDOMINAL HYSTERECTOMY  2011   with vaginal sling   ANKLE SURGERY     BREAST CYST EXCISION Bilateral    BREAST EXCISIONAL BIOPSY Bilateral    CHOLECYSTECTOMY  2012   KNEE ARTHROSCOPY W/ MEDIAL COLLATERAL LIGAMENT (MCL) REPAIR Left 10-07-2012   PORTACATH PLACEMENT N/A 11/16/2018   Procedure: INSERTION PORT-A-CATH WITH ULTRASOUND;  Surgeon: Alphonsa Overall, MD;  Location: [facility];  Service: General;  Laterality: N/A;   TONSILLECTOMY  1969     FAMILY HISTORY: Family History  Problem Relation Age of Onset   Hypertension Mother    Rheum arthritis Mother    Heart failure Mother    Hypertension Father    Alcoholism Father    Colon cancer  Father    Lung cancer Father    Depression Father    Diabetes Father    Skin cancer Father    Hypertension Brother    Stroke Brother    Atrial fibrillation Brother    Hyperlipidemia Maternal Grandfather    Stroke Paternal Grandmother    Skin cancer Paternal Grandmother    Healthy Daughter    Healthy Daughter    Breast cancer Neg Hx    Ovarian cancer Neg Hx    Prostate cancer Neg Hx    Pancreatic cancer Neg Hx    Debbie [name]'s father died from lung cancer at age 31. Patients' mother died from congestive heart failure at age 29. The patient has 1 brother. Patient denies anyone in her family having breast, ovarian, prostate, or pancreatic cancer. Debbie [name]'s father was diagnosed with skin cancer, colon cancer, and lung cancer. Debbie [name]'s paternal grandmother was diagnosed with skin cancer.    GYNECOLOGIC HISTORY:  No LMP recorded. Patient has had a hysterectomy. 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 Her husband, Debbie [name], is in [employer]	[place]. They have two daughters, Debbie [name] and Debbie [name]. [address] Debbie [name] is 57, lives in [place], [place], and  works in Building surveyor. Debbie [name] is 59, lives in [place], [place], and is currently in school; he currently works [name] time as a Publishing copy.   ADVANCED DIRECTIVES: In the absence of any documentation, Debbie [name]'s spouse, Debbie [name], is her healthcare power of attorney.      HEALTH MAINTENANCE: Social History   Tobacco Use   Smoking status: Never Smoker   Smokeless tobacco: Never Used  Substance Use Topics   Alcohol use: Yes    Comment: 2 q mth   Drug use: No    Colonoscopy: yes, 2015, [facility]   PAP: 2011  Bone density: no Mammography: 10/21/2018  Allergies   Allergen Reactions   Azithromycin    Erythromycin Itching   Levofloxacin     Tendon pain   Penicillins Swelling and Rash    Did it involve swelling of the face/tongue/throat, SOB, or low BP? Yes Did it involve sudden or severe rash/hives, skin peeling, or any reaction on the inside of your mouth or nose? No Did you need to seek medical attention at a hospital or doctor's office? Yes When did it last happen?40+ years ago If all above answers are NO, may proceed with cephalosporin use.     Current Outpatient Medications  Medication Sig Dispense Refill   AMBULATORY NON FORMULARY MEDICATION Continuous positive airway pressure (CPAP) machine set at autopap, with all supplemental supplies as needed. 1 each 0   calcium-vitamin D (OSCAL WITH D) 500-200 MG-UNIT tablet Take 1 tablet by mouth daily with breakfast.     ciprofloxacin (CIPRO) 500 MG tablet Take one tablet twice daily starting day 7 of each chemotherapy cycle; take for 5 days 50 tablet 0   dexamethasone (DECADRON) 4 MG tablet Take 2 tablets (8 mg total) by mouth 2 (two) times daily. Start the day before Taxotere. Take once the day after, then 2 times a day x 2d. 30 tablet 1   fexofenadine (ALLEGRA) 180 MG tablet Take 180 mg by mouth daily as needed for allergies or rhinitis.     fluconazole (DIFLUCAN) 100 MG tablet Take one tablet by mouth every morning starting the day of chemo and continue for 4 days 30 tablet 0   hydrochlorothiazide (HYDRODIURIL) 12.5 MG tablet Take 1 tablet (12.5 mg total) by mouth daily. 90 tablet 1   lidocaine-prilocaine (EMLA) cream Apply to affected area once 30 g 3   Liraglutide -Weight Management (SAXENDA) 18 MG/3ML SOPN Inject 3 mg into the skin daily. Please include ultra fine needles 30m 15 pen 1   LORazepam (ATIVAN) 0.5 MG tablet Take 1 tablet (0.5 mg total) by mouth every 6 (six) hours as needed (Nausea or vomiting). 30 tablet 0   metroNIDAZOLE (FLAGYL) 500 MG tablet Take 1 tablet  (500 mg total) by mouth 3 (three) times daily. For 7 days. 21 tablet 0   montelukast (SINGULAIR) 10 MG tablet Take 10 mg by mouth at bedtime.     Multiple Vitamin (MULTIVITAMIN WITH MINERALS) TABS tablet Take 1 tablet by mouth daily.     pantoprazole (PROTONIX) 40 MG tablet TAKE 1 TABLET DAILY (Patient taking differently: Take 40 mg by mouth daily. ) 90 tablet 4   potassium chloride (MICRO-K) 10 MEQ CR capsule Take 1 capsule (10 mEq total) by mouth daily. 30 capsule 1   PROAIR HFA 108 (90 Base) MCG/ACT inhaler USE 2 INHALATIONS EVERY 4 HOURS AS NEEDED FOR WHEEZING (Patient taking differently: Inhale 2 puffs into the lungs every 4 (four) hours as needed for  wheezing or shortness of breath. ) 25.5 g 1   Probiotic Product (VSL#3 PO) Take 1 tablet by mouth daily.     prochlorperazine (COMPAZINE) 10 MG tablet Take 1 tablet (10 mg total) by mouth every 6 (six) hours as needed (Nausea or vomiting). 30 tablet 1   rifaximin (XIFAXAN) 550 MG TABS tablet Take 1 tablet (550 mg total) by mouth 3 (three) times daily. For 14 days. 42 tablet 0   traMADol (ULTRAM) 50 MG tablet Take 1-2 tablets (50-100 mg total) by mouth every 6 (six) hours as needed. 14 tablet 1   valACYclovir (VALTREX) 1000 MG tablet TAKE 2 TABLETS EVERY 12 HOURS FOR ONE DAY, TAKE AT THE ONSET OF COLD SORE (Patient taking differently: Take 1,000 mg by mouth 2 (two) times daily as needed (cold sore). TAKE 2 TABLETS EVERY 12 HOURS FOR ONE DAY, TAKE AT THE ONSET OF COLD SORE) 21 tablet 1   venlafaxine XR (EFFEXOR-XR) 75 MG 24 hr capsule Take 1 capsule (75 mg total) by mouth daily with breakfast. 60 capsule 4   vitamin B-12 (CYANOCOBALAMIN) 1000 MCG tablet Take 1,000 mcg by mouth daily.     zolpidem (AMBIEN) 5 MG tablet Take 2.5 mg by mouth at bedtime as needed for sleep.     No current facility-administered medications for this visit.      OBJECTIVE: Middle-aged white woman who appears well  Vitals:   12/29/18 0814  BP: (!) 143/79    Pulse: 66  Resp: 18  Temp: 98.5 F (36.9 C)  SpO2: 99%     Body mass index is 29.94 kg/m.   Wt Readings from Last 3 Encounters:  12/29/18 185 lb 8 oz (84.1 kg)  12/08/18 179 lb 4.8 oz (81.3 kg)  11/25/18 181 lb (82.1 kg)      ECOG FS:1 - Symptomatic but completely ambulatory  Sclerae unicteric, pupils round and equal Wearing a mask No cervical or supraclavicular adenopathy Lungs no rales or rhonchi Heart regular rate and rhythm Abd soft, nontender, positive bowel sounds MSK no focal spinal tenderness, no upper extremity lymphedema Neuro: nonfocal, well oriented, appropriate affect Breasts: I no longer palpate any abnormality in the right breast   LAB RESULTS:  CMP     Component Value Date/Time   NA 142 12/08/2018 0750   K 3.1 (L) 12/08/2018 0750   CL 103 12/08/2018 0750   CO2 28 12/08/2018 0750   GLUCOSE 87 12/08/2018 0750   BUN 16 12/08/2018 0750   CREATININE 0.74 12/08/2018 0750   CREATININE 0.79 11/04/2018 0835   CREATININE 0.66 07/08/2017 1127   CALCIUM 9.5 12/08/2018 0750   PROT 6.4 (L) 12/08/2018 0750   ALBUMIN 3.9 12/08/2018 0750   AST 25 12/08/2018 0750   AST 19 11/04/2018 0835   ALT 30 12/08/2018 0750   ALT 19 11/04/2018 0835   ALKPHOS 129 (H) 12/08/2018 0750   BILITOT 0.5 12/08/2018 0750   BILITOT 0.8 11/04/2018 0835   GFRNONAA >60 12/08/2018 0750   GFRNONAA >60 11/04/2018 0835   GFRNONAA 98 07/08/2017 1127   GFRAA >60 12/08/2018 0750   GFRAA >60 11/04/2018 0835   GFRAA 114 07/08/2017 1127    No results found for: TOTALPROTELP, ALBUMINELP, A1GS, A2GS, BETS, BETA2SER, GAMS, MSPIKE, SPEI  No results found for: KPAFRELGTCHN, LAMBDASER, KAPLAMBRATIO  Lab Results  Component Value Date   WBC 4.4 12/29/2018   NEUTROABS 2.3 12/29/2018   HGB 11.3 (L) 12/29/2018   HCT 33.8 (L) 12/29/2018   MCV 87.1 12/29/2018  PLT 208 12/29/2018    _0 @  No results found for: LABCA2  No components found for: [id number]  No results for  input(s): INR in the last 168 hours.  No results found for: LABCA2  No results found for: SMO707  No results found for: EML544  No results found for: BEE100  No results found for: CA2729  No components found for: HGQUANT  No results found for: CEA1 / No results found for: CEA1   No results found for: AFPTUMOR  No results found for: CHROMOGRNA  No results found for: PSA1  Appointment on 12/29/2018  Component Date Value Ref Range Status   WBC 12/29/2018 4.4  4.0 - 10.5 K/uL Final   RBC 12/29/2018 3.88  3.87 - 5.11 MIL/uL Final   Hemoglobin 12/29/2018 11.3* 12.0 - 15.0 g/dL Final   HCT 12/29/2018 33.8* 36.0 - 46.0 % Final   MCV 12/29/2018 87.1  80.0 - 100.0 fL Final   MCH 12/29/2018 29.1  26.0 - 34.0 pg Final   MCHC 12/29/2018 33.4  30.0 - 36.0 g/dL Final   RDW 12/29/2018 15.9* 11.5 - 15.5 % Final   Platelets 12/29/2018 208  150 - 400 K/uL Final   nRBC 12/29/2018 0.0  0.0 - 0.2 % Final   Neutrophils Relative % 12/29/2018 54  % Final   Neutro Abs 12/29/2018 2.3  1.7 - 7.7 K/uL Final   Lymphocytes Relative 12/29/2018 30  % Final   Lymphs Abs 12/29/2018 1.3  0.7 - 4.0 K/uL Final   Monocytes Relative 12/29/2018 12  % Final   Monocytes Absolute 12/29/2018 0.5  0.1 - 1.0 K/uL Final   Eosinophils Relative 12/29/2018 3  % Final   Eosinophils Absolute 12/29/2018 0.2  0.0 - 0.5 K/uL Final   Basophils Relative 12/29/2018 1  % Final   Basophils Absolute 12/29/2018 0.0  0.0 - 0.1 K/uL Final   Immature Granulocytes 12/29/2018 0  % Final   Abs Immature Granulocytes 12/29/2018 0.01  0.00 - 0.07 K/uL Final   Performed at [facility], [place] [address], [place], [place] [address]    (this displays the last labs from the last 3 days)  No results found for: TOTALPROTELP, ALBUMINELP, A1GS, A2GS, BETS, BETA2SER, GAMS, MSPIKE, SPEI (this displays SPEP labs)  No results found for: KPAFRELGTCHN, LAMBDASER, KAPLAMBRATIO (kappa/lambda light  chains)  No results found for: HGBA, HGBA2QUANT, HGBFQUANT, HGBSQUAN (Hemoglobinopathy evaluation)   No results found for: LDH  Lab Results  Component Value Date   IRON 46 05/11/2013   TIBC 254 05/11/2013   IRONPCTSAT 18 (L) 05/11/2013   (Iron and TIBC)  Lab Results  Component Value Date   FERRITIN 72 05/11/2013    Urinalysis    Component Value Date/Time   BILIRUBINUR Small 11/09/2018 0914   PROTEINUR Negative 11/09/2018 0914   UROBILINOGEN 0.2 11/09/2018 0914   NITRITE Negative 11/09/2018 0914   LEUKOCYTESUR Negative 11/09/2018 0914     STUDIES:  Mr Pelvis W Wo Contrast  Result Date: 12/24/2018 CLINICAL DATA:  Breast cancer tiny liver lesions seen on recent CT with masslike thickening noted in the bladder wall. Adnexal prominence noted on that CT is well. EXAM: MRI ABDOMEN AND PELVIS WITHOUT AND WITH CONTRAST TECHNIQUE: Multiplanar multisequence MR imaging of the abdomen and pelvis was performed both before and after the administration of intravenous contrast. CONTRAST:  10 cc Gadavist COMPARISON:  CT scan 11/10/2018 FINDINGS: COMBINED FINDINGS FOR BOTH MR ABDOMEN AND PELVIS Lower chest: Unremarkable. Hepatobiliary: Very tiny 4-5  mm T2 hyperintensities are seen in the liver, corresponding to the abnormality on recent CT scan. These are demonstrated in the anterior right liver on 20/6 and medial segment left liver on 20/6. Lesions are not well seen on postcontrast imaging due to small size and mild motion degradation. No overtly suspicious enhancing liver lesion evident. Gallbladder is surgically absent. No intrahepatic biliary dilation. Common bile duct measures 9 mm diameter in the head of pancreas. Pancreas: No focal mass lesion. No dilatation of the main duct. No intraparenchymal cyst. No peripancreatic edema. Spleen:  No splenomegaly. No focal mass lesion. Adrenals/Urinary Tract: No adrenal nodule or mass. Kidneys unremarkable. No evidence for hydroureter. As noted on the recent  CT scan, there is some fullness in the region of the vesicourethral junction. A discrete bladder wall mass cannot be identified but underdistention hinders assessment. Stomach/Bowel: Stomach is unremarkable. No gastric wall thickening. No evidence of outlet obstruction. Duodenum is normally positioned as is the ligament of Treitz. No small bowel or colonic dilatation. Vascular/Lymphatic: No abdominal aortic aneurysm. No abdominal lymphadenopathy no pelvic lymphadenopathy. Reproductive: Uterus surgically absent. Left ovary measures 2.3 x 2.7 x 1.8 cm and has normal MR imaging features. Left ovary measures 1.7 x 3.4 x 1.8 cm and is also normal. Other:  No intraperitoneal free fluid. Musculoskeletal: No abnormal marrow enhancement within the visualized bony anatomy. IMPRESSION: 1. Very tiny T2 hyperintensities in the anterior right liver and medial segment left liver are not well evaluated on postcontrast imaging given the mild motion degradation and the tiny size of the lesions. No definite enhancement can be discerned in any of the lesions. These are probably benign and were well seen on the previous CT. Attention on follow-up imaging recommended to ensure stability. 2. Normal ovaries bilaterally. 3. There is some soft tissue fullness in the region of the vesicourethral junction, but a discrete bladder wall mass is not identified on this MRI. Under distention of the bladder could limit assessment. 4. Common bile duct measures 9 mm diameter in the head of pancreas, mildly distended. Correlation with liver function test may be warranted. Electronically Signed   By: Misty Stanley M.D.   On: 12/24/2018 14:32   Mr Liver W Wo Contrast  Result Date: 12/24/2018 CLINICAL DATA:  Breast cancer tiny liver lesions seen on recent CT with masslike thickening noted in the bladder wall. Adnexal prominence noted on that CT is well. EXAM: MRI ABDOMEN AND PELVIS WITHOUT AND WITH CONTRAST TECHNIQUE: Multiplanar multisequence MR  imaging of the abdomen and pelvis was performed both before and after the administration of intravenous contrast. CONTRAST:  10 cc Gadavist COMPARISON:  CT scan 11/10/2018 FINDINGS: COMBINED FINDINGS FOR BOTH MR ABDOMEN AND PELVIS Lower chest: Unremarkable. Hepatobiliary: Very tiny 4-5 mm T2 hyperintensities are seen in the liver, corresponding to the abnormality on recent CT scan. These are demonstrated in the anterior right liver on 20/6 and medial segment left liver on 20/6. Lesions are not well seen on postcontrast imaging due to small size and mild motion degradation. No overtly suspicious enhancing liver lesion evident. Gallbladder is surgically absent. No intrahepatic biliary dilation. Common bile duct measures 9 mm diameter in the head of pancreas. Pancreas: No focal mass lesion. No dilatation of the main duct. No intraparenchymal cyst. No peripancreatic edema. Spleen:  No splenomegaly. No focal mass lesion. Adrenals/Urinary Tract: No adrenal nodule or mass. Kidneys unremarkable. No evidence for hydroureter. As noted on the recent CT scan, there is some fullness in the region of the  vesicourethral junction. A discrete bladder wall mass cannot be identified but underdistention hinders assessment. Stomach/Bowel: Stomach is unremarkable. No gastric wall thickening. No evidence of outlet obstruction. Duodenum is normally positioned as is the ligament of Treitz. No small bowel or colonic dilatation. Vascular/Lymphatic: No abdominal aortic aneurysm. No abdominal lymphadenopathy no pelvic lymphadenopathy. Reproductive: Uterus surgically absent. Left ovary measures 2.3 x 2.7 x 1.8 cm and has normal MR imaging features. Left ovary measures 1.7 x 3.4 x 1.8 cm and is also normal. Other:  No intraperitoneal free fluid. Musculoskeletal: No abnormal marrow enhancement within the visualized bony anatomy. IMPRESSION: 1. Very tiny T2 hyperintensities in the anterior right liver and medial segment left liver are not well  evaluated on postcontrast imaging given the mild motion degradation and the tiny size of the lesions. No definite enhancement can be discerned in any of the lesions. These are probably benign and were well seen on the previous CT. Attention on follow-up imaging recommended to ensure stability. 2. Normal ovaries bilaterally. 3. There is some soft tissue fullness in the region of the vesicourethral junction, but a discrete bladder wall mass is not identified on this MRI. Under distention of the bladder could limit assessment. 4. Common bile duct measures 9 mm diameter in the head of pancreas, mildly distended. Correlation with liver function test may be warranted. Electronically Signed   By: Misty Stanley M.D.   On: 12/24/2018 14:32     ELIGIBLE FOR AVAILABLE RESEARCH PROTOCOL: no   ASSESSMENT: 58 y.o. Debbie [name], Debbie [name] woman status post right breast upper inner quadrant biopsy 10/28/2018 for a clinical T2 N1, stage IIA invasive ductal carcinoma, grade 2, estrogen receptor positive, progesterone receptor negative, HER-2 amplified, with an MIB-1 of 20%  (a) staging work-up with CT scans of the chest abdomen and pelvis and thyroid ultrasonography shows multiple findings that will require follow-up:   (i) two 0.5 cm liver lesions   (ii) possible bladder mass, thickened sigmoid and rectum, and prominent adnexa   (iii) thyroid nodule biopsied 11/19/2018, read as [facility] 3    (iiii)  thyroid lymph node biopsied 11/19/2018 showing no evidence of carcinoma  (1) neoadjuvant chemotherapy will consist of carboplatin, docetaxel, trastuzumab, and Pertuzumab every 21 days x 6 starting 11/17/2018  (a) pertuzumab discontinued after cycle 1 due to severe diarrhea  (2) trastuzumab will be continued to complete 1 year  (a) baseline echocardiogram 11/11/2018 showed an ejection fraction in the 55-60% range  (3) definitive surgery to follow  (4) adjuvant radiation as appropriate  (5) antiestrogens at the completion  of local treatment  (6) referral thyroid surgeon at the completion of adjuvant radiation  (7) very small liver abnormalities will require follow-up but are almost certainly benign   PLAN: Vickii Chafe will proceed to cycle 3 today.  I am not planning to put back the Pertuzumab since she continues to have diarrhea although significantly improved since we stopped that medication.  We worked out her schedule and her last treatment will be 03/02/2019.  She likely will have her surgery in the first half of August.  That means that she will not be able to get back to work most likely until 05/03/2019.  She will get [place] the papers so we can document that as needed for her insurance  She has some rosacea and she has a yeast infection in the pelvic fold.  I have written for [employer]Gel and ketoconazole cream respectively for those 2.  We reviewed pandemic precautions today.  She will see me  again in 3 weeks for treatment #4.  She knows to call for any other issue that may develop before the next visit.  Karlita Lichtman, Virgie Dad, MD  12/29/18 8:32 AM Medical Oncology and Hematology [facility] [address] [place], [place] [address] Tel. [phone]    Fax. [phone]    I, Wilburn Mylar, am acting as scribe for Dr. Virgie Dad. Aprill Banko.  I, Lurline Del MD, have reviewed the above documentation for accuracy and completeness, and I agree with the above.

## 2018-12-29 ENCOUNTER — Inpatient Hospital Stay: Payer: BC Managed Care – PPO

## 2018-12-29 ENCOUNTER — Telehealth: Payer: Self-pay | Admitting: *Deleted

## 2018-12-29 ENCOUNTER — Inpatient Hospital Stay (HOSPITAL_BASED_OUTPATIENT_CLINIC_OR_DEPARTMENT_OTHER): Payer: BC Managed Care – PPO | Admitting: Oncology

## 2018-12-29 ENCOUNTER — Other Ambulatory Visit: Payer: Self-pay

## 2018-12-29 ENCOUNTER — Inpatient Hospital Stay: Payer: BC Managed Care – PPO | Attending: Oncology

## 2018-12-29 VITALS — BP 143/79 | HR 66 | Temp 98.5°F | Resp 18 | Ht 66.0 in | Wt 185.5 lb

## 2018-12-29 DIAGNOSIS — Z95828 Presence of other vascular implants and grafts: Secondary | ICD-10-CM | POA: Insufficient documentation

## 2018-12-29 DIAGNOSIS — Z5111 Encounter for antineoplastic chemotherapy: Secondary | ICD-10-CM | POA: Diagnosis not present

## 2018-12-29 DIAGNOSIS — C50211 Malignant neoplasm of upper-inner quadrant of right female breast: Secondary | ICD-10-CM | POA: Diagnosis not present

## 2018-12-29 DIAGNOSIS — Z170 Estrogen receptor positive status [ER+]: Secondary | ICD-10-CM | POA: Insufficient documentation

## 2018-12-29 DIAGNOSIS — Z79899 Other long term (current) drug therapy: Secondary | ICD-10-CM | POA: Diagnosis not present

## 2018-12-29 DIAGNOSIS — Z7689 Persons encountering health services in other specified circumstances: Secondary | ICD-10-CM | POA: Diagnosis not present

## 2018-12-29 HISTORY — DX: Presence of other vascular implants and grafts: Z95.828

## 2018-12-29 LAB — CBC WITH DIFFERENTIAL/PLATELET
Abs Immature Granulocytes: 0.01 10*3/uL (ref 0.00–0.07)
Basophils Absolute: 0 10*3/uL (ref 0.0–0.1)
Basophils Relative: 1 %
Eosinophils Absolute: 0.2 10*3/uL (ref 0.0–0.5)
Eosinophils Relative: 3 %
HCT: 33.8 % — ABNORMAL LOW (ref 36.0–46.0)
Hemoglobin: 11.3 g/dL — ABNORMAL LOW (ref 12.0–15.0)
Immature Granulocytes: 0 %
Lymphocytes Relative: 30 %
Lymphs Abs: 1.3 10*3/uL (ref 0.7–4.0)
MCH: 29.1 pg (ref 26.0–34.0)
MCHC: 33.4 g/dL (ref 30.0–36.0)
MCV: 87.1 fL (ref 80.0–100.0)
Monocytes Absolute: 0.5 10*3/uL (ref 0.1–1.0)
Monocytes Relative: 12 %
Neutro Abs: 2.3 10*3/uL (ref 1.7–7.7)
Neutrophils Relative %: 54 %
Platelets: 208 10*3/uL (ref 150–400)
RBC: 3.88 MIL/uL (ref 3.87–5.11)
RDW: 15.9 % — ABNORMAL HIGH (ref 11.5–15.5)
WBC: 4.4 10*3/uL (ref 4.0–10.5)
nRBC: 0 % (ref 0.0–0.2)

## 2018-12-29 LAB — CMP (CANCER CENTER ONLY)
ALT: 29 U/L (ref 0–44)
AST: 25 U/L (ref 15–41)
Albumin: 3.7 g/dL (ref 3.5–5.0)
Alkaline Phosphatase: 121 U/L (ref 38–126)
Anion gap: 10 (ref 5–15)
BUN: 13 mg/dL (ref 6–20)
CO2: 29 mmol/L (ref 22–32)
Calcium: 9 mg/dL (ref 8.9–10.3)
Chloride: 105 mmol/L (ref 98–111)
Creatinine: 0.68 mg/dL (ref 0.44–1.00)
GFR, Est AFR Am: >60 mL/min (ref >60)
GFR, Estimated: >60 mL/min (ref >60)
Glucose, Bld: 85 mg/dL (ref 70–99)
Potassium: 3.1 mmol/L — ABNORMAL LOW (ref 3.5–5.1)
Sodium: 144 mmol/L (ref 135–145)
Total Bilirubin: 0.5 mg/dL (ref 0.3–1.2)
Total Protein: 6 g/dL — ABNORMAL LOW (ref 6.5–8.1)

## 2018-12-29 MED ORDER — TRASTUZUMAB CHEMO 150 MG IV SOLR
6.0000 mg/kg | Freq: Once | INTRAVENOUS | Status: AC
  Administered 2018-12-29: 504 mg via INTRAVENOUS
  Filled 2018-12-29: qty 24

## 2018-12-29 MED ORDER — ACETAMINOPHEN 325 MG PO TABS
ORAL_TABLET | ORAL | Status: AC
  Filled 2018-12-29: qty 2

## 2018-12-29 MED ORDER — SODIUM CHLORIDE 0.9% FLUSH
10.0000 mL | Freq: Once | INTRAVENOUS | Status: AC
  Administered 2018-12-29: 10 mL
  Filled 2018-12-29: qty 10

## 2018-12-29 MED ORDER — SODIUM CHLORIDE 0.9 % IV SOLN
75.0000 mg/m2 | Freq: Once | INTRAVENOUS | Status: AC
  Administered 2018-12-29: 150 mg via INTRAVENOUS
  Filled 2018-12-29: qty 15

## 2018-12-29 MED ORDER — KETOCONAZOLE 2 % EX CREA: TOPICAL_CREAM | CUTANEOUS | 0 refills | Status: DC

## 2018-12-29 MED ORDER — METRONIDAZOLE 1 % EX GEL: CUTANEOUS | 0 refills | Status: DC

## 2018-12-29 MED ORDER — DIPHENHYDRAMINE HCL 25 MG PO CAPS
25.0000 mg | ORAL_CAPSULE | Freq: Once | ORAL | Status: AC
  Administered 2018-12-29: 25 mg via ORAL

## 2018-12-29 MED ORDER — PEGFILGRASTIM 6 MG/0.6ML ~~LOC~~ PSKT
PREFILLED_SYRINGE | SUBCUTANEOUS | Status: AC
  Filled 2018-12-29: qty 0.6

## 2018-12-29 MED ORDER — ACETAMINOPHEN 325 MG PO TABS
650.0000 mg | ORAL_TABLET | Freq: Once | ORAL | Status: AC
  Administered 2018-12-29: 650 mg via ORAL

## 2018-12-29 MED ORDER — SODIUM CHLORIDE 0.9 % IV SOLN
Freq: Once | INTRAVENOUS | Status: AC
  Administered 2018-12-29: 09:00:00 via INTRAVENOUS
  Filled 2018-12-29: qty 5

## 2018-12-29 MED ORDER — PALONOSETRON HCL INJECTION 0.25 MG/5ML
INTRAVENOUS | Status: AC
  Filled 2018-12-29: qty 5

## 2018-12-29 MED ORDER — PALONOSETRON HCL INJECTION 0.25 MG/5ML
0.2500 mg | Freq: Once | INTRAVENOUS | Status: AC
  Administered 2018-12-29: 09:00:00 0.25 mg via INTRAVENOUS

## 2018-12-29 MED ORDER — SODIUM CHLORIDE 0.9 % IV SOLN
Freq: Once | INTRAVENOUS | Status: AC
  Administered 2018-12-29: 09:00:00 via INTRAVENOUS
  Filled 2018-12-29: qty 250

## 2018-12-29 MED ORDER — SODIUM CHLORIDE 0.9 % IV SOLN
634.0000 mg | Freq: Once | INTRAVENOUS | Status: AC
  Administered 2018-12-29: 630 mg via INTRAVENOUS
  Filled 2018-12-29: qty 63

## 2018-12-29 MED ORDER — HEPARIN SOD (PORK) LOCK FLUSH 100 UNIT/ML IV SOLN
500.0000 [IU] | Freq: Once | INTRAVENOUS | Status: AC | PRN
  Administered 2018-12-29: 500 [IU]
  Filled 2018-12-29: qty 5

## 2018-12-29 MED ORDER — PEGFILGRASTIM 6 MG/0.6ML ~~LOC~~ PSKT
6.0000 mg | PREFILLED_SYRINGE | Freq: Once | SUBCUTANEOUS | Status: AC
  Administered 2018-12-29: 6 mg via SUBCUTANEOUS

## 2018-12-29 MED ORDER — SODIUM CHLORIDE 0.9% FLUSH
10.0000 mL | INTRAVENOUS | Status: DC | PRN
  Administered 2018-12-29: 12:00:00 10 mL
  Filled 2018-12-29: qty 10

## 2018-12-29 MED ORDER — DIPHENHYDRAMINE HCL 25 MG PO CAPS
ORAL_CAPSULE | ORAL | Status: AC
  Filled 2018-12-29: qty 1

## 2018-12-29 NOTE — Telephone Encounter (Signed)
Left vm to check in on pt during chemo tx and to assess needs. Contact information provided for questions or concerns.

## 2018-12-29 NOTE — Progress Notes (Signed)
Dr. Jana Hakim made aware of potassium of 3.1. No new orders received.

## 2018-12-29 NOTE — Patient Instructions (Signed)
[facility] Discharge Instructions for Patients Receiving Chemotherapy  Today you received the following chemotherapy agents: Herceptin, Taxotere, Carboplatin  To help prevent nausea and vomiting after your treatment, we encourage you to take your nausea medication as directed.   If you develop nausea and vomiting that is not controlled by your nausea medication, call the clinic.   BELOW ARE SYMPTOMS THAT SHOULD BE REPORTED IMMEDIATELY:  *FEVER GREATER THAN 100.5 F  *CHILLS WITH OR WITHOUT FEVER  NAUSEA AND VOMITING THAT IS NOT CONTROLLED WITH YOUR NAUSEA MEDICATION  *UNUSUAL SHORTNESS OF BREATH  *UNUSUAL BRUISING OR BLEEDING  TENDERNESS IN MOUTH AND THROAT WITH OR WITHOUT PRESENCE OF ULCERS  *URINARY PROBLEMS  *BOWEL PROBLEMS  UNUSUAL RASH Items with * indicate a potential emergency and should be followed up as soon as possible.  Feel free to call the clinic should you have any questions or concerns. The clinic phone number is [phone].  Please show the CHEMO ALERT CARD at check-in to the Emergency Department and triage nurse.   

## 2018-12-31 ENCOUNTER — Telehealth: Payer: Self-pay | Admitting: Oncology

## 2018-12-31 ENCOUNTER — Ambulatory Visit: Payer: BLUE CROSS/BLUE SHIELD

## 2018-12-31 NOTE — Telephone Encounter (Signed)
Called regarding schedule °

## 2019-01-01 ENCOUNTER — Telehealth: Payer: Self-pay | Admitting: *Deleted

## 2019-01-01 NOTE — Telephone Encounter (Signed)
MR faxed to [place] RN Case Manager; [place] [phone]

## 2019-01-02 ENCOUNTER — Other Ambulatory Visit: Payer: Self-pay | Admitting: Physician Assistant

## 2019-01-06 ENCOUNTER — Other Ambulatory Visit: Payer: Self-pay | Admitting: *Deleted

## 2019-01-06 MED ORDER — POTASSIUM CHLORIDE ER 10 MEQ PO CPCR: 10.0000 meq | ORAL_CAPSULE | Freq: Every day | ORAL | 1 refills | Status: DC

## 2019-01-09 DIAGNOSIS — E079 Disorder of thyroid, unspecified: Secondary | ICD-10-CM | POA: Diagnosis not present

## 2019-01-14 DIAGNOSIS — B309 Viral conjunctivitis, unspecified: Secondary | ICD-10-CM | POA: Diagnosis not present

## 2019-01-14 DIAGNOSIS — H2513 Age-related nuclear cataract, bilateral: Secondary | ICD-10-CM | POA: Diagnosis not present

## 2019-01-14 DIAGNOSIS — Z831 Family history of other infectious and parasitic diseases: Secondary | ICD-10-CM | POA: Diagnosis not present

## 2019-01-17 ENCOUNTER — Other Ambulatory Visit: Payer: Self-pay | Admitting: Physician Assistant

## 2019-01-19 ENCOUNTER — Other Ambulatory Visit: Payer: BLUE CROSS/BLUE SHIELD

## 2019-01-19 ENCOUNTER — Ambulatory Visit: Payer: BLUE CROSS/BLUE SHIELD

## 2019-01-19 ENCOUNTER — Ambulatory Visit: Payer: BLUE CROSS/BLUE SHIELD | Admitting: Oncology

## 2019-01-19 NOTE — Progress Notes (Signed)
[facility]  [phone] [phone]    ID: Debbie [name] DOB: 11/19/1960  MR#: [id number]  MAU#:[id number]  Patient Care Team: Debbie [name] as PCP - General (Family Medicine) Debbie Kaufmann, RN as Oncology Nurse Navigator Debbie Germany, RN as Oncology Nurse Navigator Debbie Overall, MD as Consulting Physician (General Surgery) Debbie [name], Debbie Dad, MD as Consulting Physician (Oncology) Debbie Pray, MD as Consulting Physician (Radiation Oncology) OTHER MD: Debbie Lagos, MD (Ophthalmology)   CHIEF COMPLAINT: HER-2 positive breast cancer  CURRENT TREATMENT: Neoadjuvant chemotherapy   INTERVAL HISTORY: Debbie [name] returns today for follow-up and treatment of her HER-2 positive breast cancer.   She continues on neoadjuvant chemotherapy consisting of carboplatin, docetaxel, trastuzumab, and Pertuzumab every 21 days x 6. We  eliminated the Pertuzumab after cycle 1 because of significant diarrhea complications.  Today is day 1 cycle 4.   She notes continued fatigue. She reports her sense of taste is intact, but everything tastes salty and that she craves certain foods. She notes feeling better without the Pertuzumab, with some diarrhea for the first 3 days following chemo. She denies mouth sores, noting the only time she experienced one was after cycle 2.   She developed a headache and left eye symptoms and was evaluated by Dr. Ferdie Ping [name]/ Debbie [name] at [facility] ophthalmology 01/14/2019.  Dr. Kalman [name] for that this was most likely an adenovirus conjunctivitis.  Given the prior history including cold sores he thought herpes was in the differential.  She was started on Valtrex 1 g 3 times daily and she is scheduled for a repeat visit later this week.  Debbie [name]'s most recent echocardiogram on 11/11/2018, showed an ejection fraction in the 55% - 60% range.    REVIEW OF SYSTEMS: Debbie [name] reports doing okay [name].  She  continues walking-- about half a mile twice a day. She usually does the cooking except when she's not feeling well.  She has a salty taste.The patient denies nausea, vomiting, stiff neck, dizziness, or gait imbalance. There has been no cough, phlegm production, or pleurisy, no chest pain or pressure, and no change in bowel or bladder habits. The patient denies fever, rash, bleeding, unexplained fatigue or unexplained weight loss. A detailed review of systems was otherwise entirely negative.   HISTORY OF CURRENT ILLNESS: From the original intake note:  [place] had routine screening mammography on 10/21/2018 showing a possible abnormality in the right breast. She felt a lump in her breast, possibly dating back to 01/2018, but she didn't think anything about it due to her history with fibrous breasts; she has had fibrous breasts all her life and began to have mammograms at the age of 33.   She underwent right breast ultrasonography at [facility] on 10/28/2018 showing: On physical exam, a firm, fixed mass is palpated in the superior right breast. Targeted ultrasound is performed, showing an irregular hypoechoic mass at the 1 o'clock position 4 cm from the nipple. It measures 2.8 x 2.4 x 1.8 cm. There is associated peripheral and internal vascularity. An oval, circumscribed hypoechoic mass is also identified within the vicinity at the 1 o'clock position 4 cm from the nipple. It measures 1.0 x 0.9 x 0.4 cm. This corresponds with an additional mammographically identified mass in the upper inner quadrant at middle depth. This is mammographically stable dating back to at least 2014. Additional stable, circumscribed masses are scattered throughout the bilateral breasts mammographically. Evaluation of the right axilla demonstrates  a single, markedly abnormal lymph node with cortical thickening up to 1 cm. No additional suspicious lymphadenopathy is identified.  Accordingly on 10/28/2018 she  proceeded to biopsy of the right breast area in question. The pathology from this procedure showed ([id number]): invasive ductal carcinoma, nottingham grade II of III, 2.8 cm, 1 o'clock, 4.0 cm from the nipple. Prognostic indicators significant for: estrogen receptor, 90% positive, with strong staining intensity and progesterone receptor, 0% negative. Proliferation marker Ki67 at 20%. HER2 Positive (3+) by immunohistochemistry.   An additional biopsy of the right axilla was performed on the same day ([id number]) showing:  2. Lymph node, needle/core biopsy, inferior right axilla - Tumor cells within fragmented nodal tissue  The patient's subsequent history is as detailed above.   PAST MEDICAL HISTORY: Past Medical History:  Diagnosis Date   Anxiety    Asthma    with severe URIs   Bronchitis    Essential hypertension, benign 12/01/2012   Generalized anxiety disorder 12/01/2012   Hypertension    Irritable bowel syndrome (IBS)    "had for years," per patient   PONV (postoperative nausea and vomiting)    Seasonal allergies    Sleep apnea      PAST SURGICAL HISTORY: Past Surgical History:  Procedure Laterality Date   ABDOMINAL HYSTERECTOMY  2011   with vaginal sling   ANKLE SURGERY     BREAST CYST EXCISION Bilateral    BREAST EXCISIONAL BIOPSY Bilateral    CHOLECYSTECTOMY  2012   KNEE ARTHROSCOPY W/ MEDIAL COLLATERAL LIGAMENT (MCL) REPAIR Left 10-07-2012   PORTACATH PLACEMENT N/A 11/16/2018   Procedure: INSERTION PORT-A-CATH WITH ULTRASOUND;  Surgeon: Debbie Overall, MD;  Location: [facility];  Service: General;  Laterality: N/A;   TONSILLECTOMY  1969     FAMILY HISTORY: Family History  Problem Relation Age of Onset   Hypertension Mother    Rheum arthritis Mother    Heart failure Mother    Hypertension Father    Alcoholism Father    Colon cancer Father    Lung cancer Father    Depression Father    Diabetes Father    Skin cancer Father     Hypertension Brother    Stroke Brother    Atrial fibrillation Brother    Hyperlipidemia Maternal Grandfather    Stroke Paternal Grandmother    Skin cancer Paternal Grandmother    Healthy Daughter    Healthy Daughter    Breast cancer Neg Hx    Ovarian cancer Neg Hx    Prostate cancer Neg Hx    Pancreatic cancer Neg Hx    Zephaniah's father died from lung cancer at age 65. Patients' mother died from congestive heart failure at age 78. The patient has 1 brother. Patient denies anyone in her family having breast, ovarian, prostate, or pancreatic cancer. Tyrese's father was diagnosed with skin cancer, colon cancer, and lung cancer. [name]'s paternal grandmother was diagnosed with skin cancer.    GYNECOLOGIC HISTORY:  No LMP recorded. Patient has had a hysterectomy. 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 Her husband, Dalyla Chui, is in [employer]	[place]. They have two daughters, Kerri Perches and [place]. [address] Dawna Part is 82, lives in [place], [place], and works in Building surveyor. Adrian Blackwater Bronte is 40, lives in [place], [place], and  is currently in school; he currently works part time as a Publishing copy.   ADVANCED DIRECTIVES: In the absence of any documentation, Paraskevi's spouse, [name], is her healthcare power of attorney.      HEALTH MAINTENANCE: Social History   Tobacco Use   Smoking status: Never Smoker   Smokeless tobacco: Never Used  Substance Use Topics   Alcohol use: Yes    Comment: 2 q mth   Drug use: No    Colonoscopy: yes, 2015, [facility]   PAP: 2011  Bone density: no Mammography: 10/21/2018  Allergies  Allergen Reactions   Azithromycin    Erythromycin Itching   Levofloxacin     Tendon pain    Penicillins Swelling and Rash    Did it involve swelling of the face/tongue/throat, SOB, or low BP? Yes Did it involve sudden or severe rash/hives, skin peeling, or any reaction on the inside of your mouth or nose? No Did you need to seek medical attention at a hospital or doctor's office? Yes When did it last happen?40+ years ago If all above answers are NO, may proceed with cephalosporin use.     Current Outpatient Medications  Medication Sig Dispense Refill   AMBULATORY NON FORMULARY MEDICATION Continuous positive airway pressure (CPAP) machine set at autopap, with all supplemental supplies as needed. 1 each 0   calcium-vitamin D (OSCAL WITH D) 500-200 MG-UNIT tablet Take 1 tablet by mouth daily with breakfast.     ciprofloxacin (CIPRO) 500 MG tablet Take one tablet twice daily starting day 7 of each chemotherapy cycle; take for 5 days 50 tablet 0   dexamethasone (DECADRON) 4 MG tablet Take 2 tablets (8 mg total) by mouth 2 (two) times daily. Start the day before Taxotere. Take once the day after, then 2 times a day x 2d. 30 tablet 1   fexofenadine (ALLEGRA) 180 MG tablet Take 180 mg by mouth daily as needed for allergies or rhinitis.     fluconazole (DIFLUCAN) 100 MG tablet Take one tablet by mouth every morning starting the day of chemo and continue for 4 days 30 tablet 0   hydrochlorothiazide (HYDRODIURIL) 12.5 MG tablet Take 1 tablet (12.5 mg total) by mouth daily. 90 tablet 1   ketoconazole (NIZORAL) 2 % cream Apply to rash in pelvic area daily until cleared 15 g 0   lidocaine-prilocaine (EMLA) cream Apply to affected area once 30 g 3   Liraglutide -Weight Management (SAXENDA) 18 MG/3ML SOPN Inject 3 mg into the skin daily. Please include ultra fine needles 5m 15 pen 1   LORazepam (ATIVAN) 0.5 MG tablet Take 1 tablet (0.5 mg total) by mouth every 6 (six) hours as needed (Nausea or vomiting). 30 tablet 0   metroNIDAZOLE (FLAGYL) 500 MG tablet Take 1 tablet (500 mg  total) by mouth 3 (three) times daily. For 7 days. 21 tablet 0   metroNIDAZOLE (METROGEL) 1 % gel Apply to facial rash daily until cleared 45 g 0   montelukast (SINGULAIR) 10 MG tablet TAKE 1 TABLET AT BEDTIME 90 tablet 3   Multiple Vitamin (MULTIVITAMIN WITH MINERALS) TABS tablet Take 1 tablet by mouth daily.     pantoprazole (PROTONIX) 40 MG tablet TAKE 1 TABLET DAILY (Patient taking differently: Take 40 mg by mouth daily. ) 90 tablet 4   potassium chloride (MICRO-K) 10 MEQ CR capsule Take 1 capsule (10 mEq total) by mouth daily. 90 capsule 1   PROAIR HFA 108 (90 Base) MCG/ACT inhaler USE 2 INHALATIONS EVERY 4  HOURS AS NEEDED FOR WHEEZING (Patient taking differently: Inhale 2 puffs into the lungs every 4 (four) hours as needed for wheezing or shortness of breath. ) 25.5 g 1   Probiotic Product (VSL#3 PO) Take 1 tablet by mouth daily.     prochlorperazine (COMPAZINE) 10 MG tablet Take 1 tablet (10 mg total) by mouth every 6 (six) hours as needed (Nausea or vomiting). 30 tablet 1   rifaximin (XIFAXAN) 550 MG TABS tablet Take 1 tablet (550 mg total) by mouth 3 (three) times daily. For 14 days. 42 tablet 0   traMADol (ULTRAM) 50 MG tablet Take 1-2 tablets (50-100 mg total) by mouth every 6 (six) hours as needed. 14 tablet 1   valACYclovir (VALTREX) 1000 MG tablet Take 1 tablet (1,000 mg total) by mouth 2 (two) times daily as needed (cold sore). TAKE 2 TABLETS EVERY 12 HOURS FOR ONE DAY, TAKE AT THE ONSET OF COLD SORE 21 tablet 1   venlafaxine XR (EFFEXOR-XR) 75 MG 24 hr capsule Take 1 capsule (75 mg total) by mouth daily with breakfast. 60 capsule 4   vitamin B-12 (CYANOCOBALAMIN) 1000 MCG tablet Take 1,000 mcg by mouth daily.     zolpidem (AMBIEN) 5 MG tablet Take 2.5 mg by mouth at bedtime as needed for sleep.     No current facility-administered medications for this visit.      OBJECTIVE: Middle-aged white woman who appears stated age  33:   01/20/19 0823  BP: (!) 164/78    Pulse: 78  Resp: 18  Temp: 98.7 F (37.1 C)  SpO2: 99%     Body mass index is 30.02 kg/m.   Wt Readings from Last 3 Encounters:  01/20/19 186 lb (84.4 kg)  12/29/18 185 lb 8 oz (84.1 kg)  12/08/18 179 lb 4.8 oz (81.3 kg)      ECOG FS:1 - Symptomatic but completely ambulatory  Sclerae unicteric, no erythema, left eye in particular unremarkable, with the EOMs intact, pupil round and reactive; describes left eye vision is slightly blurred Wearing a mask No cervical or supraclavicular adenopathy Lungs no rales or rhonchi Heart regular rate and rhythm Abd soft, nontender, positive bowel sounds MSK no focal spinal tenderness, no upper extremity lymphedema Neuro: nonfocal, well oriented, appropriate affect Breasts: No mass palpated in the right breast, no skin or nipple change of concern.  Both axillae are benign   LAB RESULTS:  CMP     Component Value Date/Time   NA 144 12/29/2018 0801   K 3.1 (L) 12/29/2018 0801   CL 105 12/29/2018 0801   CO2 29 12/29/2018 0801   GLUCOSE 85 12/29/2018 0801   BUN 13 12/29/2018 0801   CREATININE 0.68 12/29/2018 0801   CREATININE 0.66 07/08/2017 1127   CALCIUM 9.0 12/29/2018 0801   PROT 6.0 (L) 12/29/2018 0801   ALBUMIN 3.7 12/29/2018 0801   AST 25 12/29/2018 0801   ALT 29 12/29/2018 0801   ALKPHOS 121 12/29/2018 0801   BILITOT 0.5 12/29/2018 0801   GFRNONAA >60 12/29/2018 0801   GFRNONAA 98 07/08/2017 1127   GFRAA >60 12/29/2018 0801   GFRAA 114 07/08/2017 1127    No results found for: TOTALPROTELP, ALBUMINELP, A1GS, A2GS, BETS, BETA2SER, GAMS, MSPIKE, SPEI  No results found for: KPAFRELGTCHN, LAMBDASER, KAPLAMBRATIO  Lab Results  Component Value Date   WBC 4.5 01/20/2019   NEUTROABS 2.4 01/20/2019   HGB 11.3 (L) 01/20/2019   HCT 33.0 (L) 01/20/2019   MCV 88.0 01/20/2019   PLT 184 01/20/2019    @  LASTCHEMISTRY@  No results found for: LABCA2  No components found for: ATFTDD220  No results for input(s): INR in the last  168 hours.  No results found for: LABCA2  No results found for: URK270  No results found for: WCB762  No results found for: GBT517  No results found for: CA2729  No components found for: HGQUANT  No results found for: CEA1 / No results found for: CEA1   No results found for: AFPTUMOR  No results found for: CHROMOGRNA  No results found for: PSA1  Appointment on 01/20/2019  Component Date Value Ref Range Status   WBC 01/20/2019 4.5  4.0 - 10.5 K/uL Final   RBC 01/20/2019 3.75* 3.87 - 5.11 MIL/uL Final   Hemoglobin 01/20/2019 11.3* 12.0 - 15.0 g/dL Final   HCT 01/20/2019 33.0* 36.0 - 46.0 % Final   MCV 01/20/2019 88.0  80.0 - 100.0 fL Final   MCH 01/20/2019 30.1  26.0 - 34.0 pg Final   MCHC 01/20/2019 34.2  30.0 - 36.0 g/dL Final   RDW 01/20/2019 17.5* 11.5 - 15.5 % Final   Platelets 01/20/2019 184  150 - 400 K/uL Final   nRBC 01/20/2019 0.0  0.0 - 0.2 % Final   Neutrophils Relative % 01/20/2019 51  % Final   Neutro Abs 01/20/2019 2.4  1.7 - 7.7 K/uL Final   Lymphocytes Relative 01/20/2019 30  % Final   Lymphs Abs 01/20/2019 1.3  0.7 - 4.0 K/uL Final   Monocytes Relative 01/20/2019 14  % Final   Monocytes Absolute 01/20/2019 0.6  0.1 - 1.0 K/uL Final   Eosinophils Relative 01/20/2019 4  % Final   Eosinophils Absolute 01/20/2019 0.2  0.0 - 0.5 K/uL Final   Basophils Relative 01/20/2019 1  % Final   Basophils Absolute 01/20/2019 0.0  0.0 - 0.1 K/uL Final   Immature Granulocytes 01/20/2019 0  % Final   Abs Immature Granulocytes 01/20/2019 0.02  0.00 - 0.07 K/uL Final   Performed at [facility], [place] [address] [place], [place] [address]    (this displays the last labs from the last 3 days)  No results found for: TOTALPROTELP, ALBUMINELP, A1GS, A2GS, BETS, BETA2SER, GAMS, MSPIKE, SPEI (this displays SPEP labs)  No results found for: KPAFRELGTCHN, LAMBDASER, KAPLAMBRATIO (kappa/lambda light chains)  No results found for:  HGBA, HGBA2QUANT, HGBFQUANT, HGBSQUAN (Hemoglobinopathy evaluation)   No results found for: LDH  Lab Results  Component Value Date   IRON 46 05/11/2013   TIBC 254 05/11/2013   IRONPCTSAT 18 (L) 05/11/2013   (Iron and TIBC)  Lab Results  Component Value Date   FERRITIN 72 05/11/2013    Urinalysis    Component Value Date/Time   BILIRUBINUR Small 11/09/2018 0914   PROTEINUR Negative 11/09/2018 0914   UROBILINOGEN 0.2 11/09/2018 0914   NITRITE Negative 11/09/2018 0914   LEUKOCYTESUR Negative 11/09/2018 0914     STUDIES:  Mr Pelvis W Wo Contrast  Result Date: 12/24/2018 CLINICAL DATA:  Breast cancer tiny liver lesions seen on recent CT with masslike thickening noted in the bladder wall. Adnexal prominence noted on that CT is well. EXAM: MRI ABDOMEN AND PELVIS WITHOUT AND WITH CONTRAST TECHNIQUE: Multiplanar multisequence MR imaging of the abdomen and pelvis was performed both before and after the administration of intravenous contrast. CONTRAST:  10 cc Gadavist COMPARISON:  CT scan 11/10/2018 FINDINGS: COMBINED FINDINGS FOR BOTH MR ABDOMEN AND PELVIS Lower chest: Unremarkable. Hepatobiliary: Very tiny 4-5 mm T2 hyperintensities are seen in the  liver, corresponding to the abnormality on recent CT scan. These are demonstrated in the anterior right liver on 20/6 and medial segment left liver on 20/6. Lesions are not well seen on postcontrast imaging due to small size and mild motion degradation. No overtly suspicious enhancing liver lesion evident. Gallbladder is surgically absent. No intrahepatic biliary dilation. Common bile duct measures 9 mm diameter in the head of pancreas. Pancreas: No focal mass lesion. No dilatation of the main duct. No intraparenchymal cyst. No peripancreatic edema. Spleen:  No splenomegaly. No focal mass lesion. Adrenals/Urinary Tract: No adrenal nodule or mass. Kidneys unremarkable. No evidence for hydroureter. As noted on the recent CT scan, there is some fullness  in the region of the vesicourethral junction. A discrete bladder wall mass cannot be identified but underdistention hinders assessment. Stomach/Bowel: Stomach is unremarkable. No gastric wall thickening. No evidence of outlet obstruction. Duodenum is normally positioned as is the ligament of Treitz. No small bowel or colonic dilatation. Vascular/Lymphatic: No abdominal aortic aneurysm. No abdominal lymphadenopathy no pelvic lymphadenopathy. Reproductive: Uterus surgically absent. Left ovary measures 2.3 x 2.7 x 1.8 cm and has normal MR imaging features. Left ovary measures 1.7 x 3.4 x 1.8 cm and is also normal. Other:  No intraperitoneal free fluid. Musculoskeletal: No abnormal marrow enhancement within the visualized bony anatomy. IMPRESSION: 1. Very tiny T2 hyperintensities in the anterior right liver and medial segment left liver are not well evaluated on postcontrast imaging given the mild motion degradation and the tiny size of the lesions. No definite enhancement can be discerned in any of the lesions. These are probably benign and were well seen on the previous CT. Attention on follow-up imaging recommended to ensure stability. 2. Normal ovaries bilaterally. 3. There is some soft tissue fullness in the region of the vesicourethral junction, but a discrete bladder wall mass is not identified on this MRI. Under distention of the bladder could limit assessment. 4. Common bile duct measures 9 mm diameter in the head of pancreas, mildly distended. Correlation with liver function test may be warranted. Electronically Signed   By: Misty Stanley M.D.   On: 12/24/2018 14:32   Mr Liver W Wo Contrast  Result Date: 12/24/2018 CLINICAL DATA:  Breast cancer tiny liver lesions seen on recent CT with masslike thickening noted in the bladder wall. Adnexal prominence noted on that CT is well. EXAM: MRI ABDOMEN AND PELVIS WITHOUT AND WITH CONTRAST TECHNIQUE: Multiplanar multisequence MR imaging of the abdomen and pelvis was  performed both before and after the administration of intravenous contrast. CONTRAST:  10 cc Gadavist COMPARISON:  CT scan 11/10/2018 FINDINGS: COMBINED FINDINGS FOR BOTH MR ABDOMEN AND PELVIS Lower chest: Unremarkable. Hepatobiliary: Very tiny 4-5 mm T2 hyperintensities are seen in the liver, corresponding to the abnormality on recent CT scan. These are demonstrated in the anterior right liver on 20/6 and medial segment left liver on 20/6. Lesions are not well seen on postcontrast imaging due to small size and mild motion degradation. No overtly suspicious enhancing liver lesion evident. Gallbladder is surgically absent. No intrahepatic biliary dilation. Common bile duct measures 9 mm diameter in the head of pancreas. Pancreas: No focal mass lesion. No dilatation of the main duct. No intraparenchymal cyst. No peripancreatic edema. Spleen:  No splenomegaly. No focal mass lesion. Adrenals/Urinary Tract: No adrenal nodule or mass. Kidneys unremarkable. No evidence for hydroureter. As noted on the recent CT scan, there is some fullness in the region of the vesicourethral junction. A discrete bladder wall mass  cannot be identified but underdistention hinders assessment. Stomach/Bowel: Stomach is unremarkable. No gastric wall thickening. No evidence of outlet obstruction. Duodenum is normally positioned as is the ligament of Treitz. No small bowel or colonic dilatation. Vascular/Lymphatic: No abdominal aortic aneurysm. No abdominal lymphadenopathy no pelvic lymphadenopathy. Reproductive: Uterus surgically absent. Left ovary measures 2.3 x 2.7 x 1.8 cm and has normal MR imaging features. Left ovary measures 1.7 x 3.4 x 1.8 cm and is also normal. Other:  No intraperitoneal free fluid. Musculoskeletal: No abnormal marrow enhancement within the visualized bony anatomy. IMPRESSION: 1. Very tiny T2 hyperintensities in the anterior right liver and medial segment left liver are not well evaluated on postcontrast imaging given  the mild motion degradation and the tiny size of the lesions. No definite enhancement can be discerned in any of the lesions. These are probably benign and were well seen on the previous CT. Attention on follow-up imaging recommended to ensure stability. 2. Normal ovaries bilaterally. 3. There is some soft tissue fullness in the region of the vesicourethral junction, but a discrete bladder wall mass is not identified on this MRI. Under distention of the bladder could limit assessment. 4. Common bile duct measures 9 mm diameter in the head of pancreas, mildly distended. Correlation with liver function test may be warranted. Electronically Signed   By: Misty Stanley M.D.   On: 12/24/2018 14:32     ELIGIBLE FOR AVAILABLE RESEARCH PROTOCOL: no   ASSESSMENT: 58 y.o. Jule Ser, [place] woman status post right breast upper inner quadrant biopsy 10/28/2018 for a clinical T2 N1, stage IIA invasive ductal carcinoma, grade 2, estrogen receptor positive, progesterone receptor negative, HER-2 amplified, with an MIB-1 of 20%  (a) staging work-up with CT scans of the chest abdomen and pelvis and thyroid ultrasonography shows multiple findings that will require follow-up:   (i) two 0.5 cm liver lesions   (ii) possible bladder mass, thickened sigmoid and rectum, and prominent adnexa   (iii) thyroid nodule biopsied 11/19/2018, read as [facility] 3    (iiii)  thyroid lymph node biopsied 11/19/2018 showing no evidence of carcinoma  (1) neoadjuvant chemotherapy will consist of carboplatin, docetaxel, trastuzumab, and Pertuzumab every 21 days x 6 starting 11/17/2018  (a) pertuzumab discontinued after cycle 1 due to severe diarrhea  (2) trastuzumab will be continued to complete 1 year  (a) baseline echocardiogram 11/11/2018 showed an ejection fraction in the 55-60% range  (3) definitive surgery to follow  (4) adjuvant radiation as appropriate  (5) antiestrogens at the completion of local treatment  (6) referral  thyroid surgeon at the completion of adjuvant radiation  (7) very small liver abnormalities will require follow-up but are almost certainly benign  (8) viral conjunctivitis diagnosed 01/14/2019   PLAN: Vickii Chafe had mouth sores with her second chemotherapy cycle, and a remote history of viral conjunctivitis.  She now has a new episode and has been prescribed Valtrex 1 g 3 times daily.  For the past few days she has cut the dose down to twice a day.  Today I urged her to go ahead and take it 3 times daily as prescribed by Dr. Yisroel Ramming.  When she completes her course of therapy I am going to start her on 1 g a day for suppression until she is done with chemotherapy.  I do think it is okay to proceed to cycle 4 of chemo today.  I am going to leave the Pertuzumab off given the fact that she is still having some occasional diarrhea and tolerated  the first cycle so poorly.  She is taking appropriate pandemic precautions  She will return to see me in 3 weeks for cycle 5 but knows to call for any other issue that may develop before the next scheduled visit.  Erum Cercone, Debbie Dad, MD  01/20/19 8:40 AM Medical Oncology and Hematology [facility] [address] [place], [place] [address] Tel. [phone]    Fax. [phone]    I, Wilburn Mylar, am acting as scribe for Dr. Virgie [name]. Candice Tobey.  I, Lurline Del MD, have reviewed the above documentation for accuracy and completeness, and I agree with the above.

## 2019-01-20 ENCOUNTER — Inpatient Hospital Stay: Payer: BC Managed Care – PPO | Admitting: Oncology

## 2019-01-20 ENCOUNTER — Inpatient Hospital Stay: Payer: BC Managed Care – PPO | Attending: Oncology

## 2019-01-20 ENCOUNTER — Inpatient Hospital Stay: Payer: BC Managed Care – PPO

## 2019-01-20 ENCOUNTER — Other Ambulatory Visit: Payer: Self-pay

## 2019-01-20 ENCOUNTER — Encounter: Payer: Self-pay | Admitting: *Deleted

## 2019-01-20 VITALS — BP 164/78 | HR 78 | Temp 98.7°F | Resp 18 | Ht 66.0 in | Wt 186.0 lb

## 2019-01-20 DIAGNOSIS — C50211 Malignant neoplasm of upper-inner quadrant of right female breast: Secondary | ICD-10-CM

## 2019-01-20 DIAGNOSIS — T451X5A Adverse effect of antineoplastic and immunosuppressive drugs, initial encounter: Secondary | ICD-10-CM | POA: Diagnosis not present

## 2019-01-20 DIAGNOSIS — R9389 Abnormal findings on diagnostic imaging of other specified body structures: Secondary | ICD-10-CM | POA: Diagnosis not present

## 2019-01-20 DIAGNOSIS — R935 Abnormal findings on diagnostic imaging of other abdominal regions, including retroperitoneum: Secondary | ICD-10-CM

## 2019-01-20 DIAGNOSIS — K769 Liver disease, unspecified: Secondary | ICD-10-CM | POA: Insufficient documentation

## 2019-01-20 DIAGNOSIS — Z170 Estrogen receptor positive status [ER+]: Secondary | ICD-10-CM | POA: Insufficient documentation

## 2019-01-20 DIAGNOSIS — Z5112 Encounter for antineoplastic immunotherapy: Secondary | ICD-10-CM | POA: Insufficient documentation

## 2019-01-20 DIAGNOSIS — Z95828 Presence of other vascular implants and grafts: Secondary | ICD-10-CM

## 2019-01-20 DIAGNOSIS — K521 Toxic gastroenteritis and colitis: Secondary | ICD-10-CM | POA: Insufficient documentation

## 2019-01-20 DIAGNOSIS — B0053 Herpesviral conjunctivitis: Secondary | ICD-10-CM | POA: Insufficient documentation

## 2019-01-20 DIAGNOSIS — E041 Nontoxic single thyroid nodule: Secondary | ICD-10-CM | POA: Insufficient documentation

## 2019-01-20 DIAGNOSIS — Z5111 Encounter for antineoplastic chemotherapy: Secondary | ICD-10-CM | POA: Diagnosis not present

## 2019-01-20 DIAGNOSIS — R040 Epistaxis: Secondary | ICD-10-CM | POA: Diagnosis not present

## 2019-01-20 DIAGNOSIS — Z5189 Encounter for other specified aftercare: Secondary | ICD-10-CM | POA: Insufficient documentation

## 2019-01-20 LAB — CMP (CANCER CENTER ONLY)
ALT: 42 U/L (ref 0–44)
AST: 42 U/L — ABNORMAL HIGH (ref 15–41)
Albumin: 3.8 g/dL (ref 3.5–5.0)
Alkaline Phosphatase: 120 U/L (ref 38–126)
Anion gap: 11 (ref 5–15)
BUN: 12 mg/dL (ref 6–20)
CO2: 27 mmol/L (ref 22–32)
Calcium: 9.1 mg/dL (ref 8.9–10.3)
Chloride: 105 mmol/L (ref 98–111)
Creatinine: 0.71 mg/dL (ref 0.44–1.00)
GFR, Est AFR Am: >60 mL/min (ref >60)
GFR, Estimated: >60 mL/min (ref >60)
Glucose, Bld: 86 mg/dL (ref 70–99)
Potassium: 3.1 mmol/L — ABNORMAL LOW (ref 3.5–5.1)
Sodium: 143 mmol/L (ref 135–145)
Total Bilirubin: 0.5 mg/dL (ref 0.3–1.2)
Total Protein: 6 g/dL — ABNORMAL LOW (ref 6.5–8.1)

## 2019-01-20 LAB — CBC WITH DIFFERENTIAL/PLATELET
Abs Immature Granulocytes: 0.02 10*3/uL (ref 0.00–0.07)
Basophils Absolute: 0 10*3/uL (ref 0.0–0.1)
Basophils Relative: 1 %
Eosinophils Absolute: 0.2 10*3/uL (ref 0.0–0.5)
Eosinophils Relative: 4 %
HCT: 33 % — ABNORMAL LOW (ref 36.0–46.0)
Hemoglobin: 11.3 g/dL — ABNORMAL LOW (ref 12.0–15.0)
Immature Granulocytes: 0 %
Lymphocytes Relative: 30 %
Lymphs Abs: 1.3 10*3/uL (ref 0.7–4.0)
MCH: 30.1 pg (ref 26.0–34.0)
MCHC: 34.2 g/dL (ref 30.0–36.0)
MCV: 88 fL (ref 80.0–100.0)
Monocytes Absolute: 0.6 10*3/uL (ref 0.1–1.0)
Monocytes Relative: 14 %
Neutro Abs: 2.4 10*3/uL (ref 1.7–7.7)
Neutrophils Relative %: 51 %
Platelets: 184 10*3/uL (ref 150–400)
RBC: 3.75 MIL/uL — ABNORMAL LOW (ref 3.87–5.11)
RDW: 17.5 % — ABNORMAL HIGH (ref 11.5–15.5)
WBC: 4.5 10*3/uL (ref 4.0–10.5)
nRBC: 0 % (ref 0.0–0.2)

## 2019-01-20 MED ORDER — PEGFILGRASTIM 6 MG/0.6ML ~~LOC~~ PSKT
6.0000 mg | PREFILLED_SYRINGE | Freq: Once | SUBCUTANEOUS | Status: AC
  Administered 2019-01-20: 6 mg via SUBCUTANEOUS

## 2019-01-20 MED ORDER — DIPHENHYDRAMINE HCL 25 MG PO CAPS
ORAL_CAPSULE | ORAL | Status: AC
  Filled 2019-01-20: qty 1

## 2019-01-20 MED ORDER — PALONOSETRON HCL INJECTION 0.25 MG/5ML
0.2500 mg | Freq: Once | INTRAVENOUS | Status: AC
  Administered 2019-01-20: 10:00:00 0.25 mg via INTRAVENOUS

## 2019-01-20 MED ORDER — SODIUM CHLORIDE 0.9 % IV SOLN
634.0000 mg | Freq: Once | INTRAVENOUS | Status: AC
  Administered 2019-01-20: 630 mg via INTRAVENOUS
  Filled 2019-01-20: qty 63

## 2019-01-20 MED ORDER — DIPHENHYDRAMINE HCL 25 MG PO CAPS
25.0000 mg | ORAL_CAPSULE | Freq: Once | ORAL | Status: AC
  Administered 2019-01-20: 25 mg via ORAL

## 2019-01-20 MED ORDER — TRASTUZUMAB CHEMO 150 MG IV SOLR
6.0000 mg/kg | Freq: Once | INTRAVENOUS | Status: AC
  Administered 2019-01-20: 504 mg via INTRAVENOUS
  Filled 2019-01-20: qty 24

## 2019-01-20 MED ORDER — SODIUM CHLORIDE 0.9% FLUSH
10.0000 mL | INTRAVENOUS | Status: DC | PRN
  Administered 2019-01-20: 10 mL
  Filled 2019-01-20: qty 10

## 2019-01-20 MED ORDER — SODIUM CHLORIDE 0.9 % IV SOLN
Freq: Once | INTRAVENOUS | Status: AC
  Administered 2019-01-20: 10:00:00 via INTRAVENOUS
  Filled 2019-01-20: qty 5

## 2019-01-20 MED ORDER — SODIUM CHLORIDE 0.9 % IV SOLN
Freq: Once | INTRAVENOUS | Status: AC
  Administered 2019-01-20: 09:00:00 via INTRAVENOUS
  Filled 2019-01-20: qty 250

## 2019-01-20 MED ORDER — PALONOSETRON HCL INJECTION 0.25 MG/5ML
INTRAVENOUS | Status: AC
  Filled 2019-01-20: qty 5

## 2019-01-20 MED ORDER — SODIUM CHLORIDE 0.9 % IV SOLN
75.0000 mg/m2 | Freq: Once | INTRAVENOUS | Status: AC
  Administered 2019-01-20: 11:00:00 150 mg via INTRAVENOUS
  Filled 2019-01-20: qty 15

## 2019-01-20 MED ORDER — SODIUM CHLORIDE 0.9% FLUSH
10.0000 mL | Freq: Once | INTRAVENOUS | Status: AC
  Administered 2019-01-20: 10 mL
  Filled 2019-01-20: qty 10

## 2019-01-20 MED ORDER — HEPARIN SOD (PORK) LOCK FLUSH 100 UNIT/ML IV SOLN
500.0000 [IU] | Freq: Once | INTRAVENOUS | Status: AC | PRN
  Administered 2019-01-20: 500 [IU]
  Filled 2019-01-20: qty 5

## 2019-01-20 MED ORDER — ACETAMINOPHEN 325 MG PO TABS
ORAL_TABLET | ORAL | Status: AC
  Filled 2019-01-20: qty 2

## 2019-01-20 MED ORDER — ACETAMINOPHEN 325 MG PO TABS
650.0000 mg | ORAL_TABLET | Freq: Once | ORAL | Status: AC
  Administered 2019-01-20: 650 mg via ORAL

## 2019-01-20 MED ORDER — PEGFILGRASTIM 6 MG/0.6ML ~~LOC~~ PSKT
PREFILLED_SYRINGE | SUBCUTANEOUS | Status: AC
  Filled 2019-01-20: qty 0.6

## 2019-01-20 NOTE — Patient Instructions (Signed)
[facility] Discharge Instructions for Patients Receiving Chemotherapy  Today you received the following chemotherapy agents: Herceptin, Taxotere, Carboplatin  To help prevent nausea and vomiting after your treatment, we encourage you to take your nausea medication as directed.   If you develop nausea and vomiting that is not controlled by your nausea medication, call the clinic.   BELOW ARE SYMPTOMS THAT SHOULD BE REPORTED IMMEDIATELY:  *FEVER GREATER THAN 100.5 F  *CHILLS WITH OR WITHOUT FEVER  NAUSEA AND VOMITING THAT IS NOT CONTROLLED WITH YOUR NAUSEA MEDICATION  *UNUSUAL SHORTNESS OF BREATH  *UNUSUAL BRUISING OR BLEEDING  TENDERNESS IN MOUTH AND THROAT WITH OR WITHOUT PRESENCE OF ULCERS  *URINARY PROBLEMS  *BOWEL PROBLEMS  UNUSUAL RASH Items with * indicate a potential emergency and should be followed up as soon as possible.  Feel free to call the clinic should you have any questions or concerns. The clinic phone number is [phone].  Please show the CHEMO ALERT CARD at check-in to the Emergency Department and triage nurse.   

## 2019-01-21 ENCOUNTER — Encounter (HOSPITAL_COMMUNITY): Payer: Self-pay

## 2019-01-22 DIAGNOSIS — B309 Viral conjunctivitis, unspecified: Secondary | ICD-10-CM | POA: Diagnosis not present

## 2019-01-22 DIAGNOSIS — H25813 Combined forms of age-related cataract, bilateral: Secondary | ICD-10-CM | POA: Diagnosis not present

## 2019-01-28 DIAGNOSIS — G4733 Obstructive sleep apnea (adult) (pediatric): Secondary | ICD-10-CM | POA: Diagnosis not present

## 2019-02-08 NOTE — Progress Notes (Signed)
[place]  [phone] [phone]    ID: Debbie [name] DOB: 04/06/1961  MR#: [id number]  KCL#:[id number]  Patient Care Team: Lavada Mesi as PCP - General (Family Medicine) Mauro Kaufmann, RN as Oncology Nurse Navigator Rockwell Germany, RN as Oncology Nurse Navigator Alphonsa Overall, MD as Consulting Physician (General Surgery) Taijon Vink, Virgie Dad, MD as Consulting Physician (Oncology) Gery Pray, MD as Consulting Physician (Radiation Oncology) Burden, Lincoln Brigham, MD as Referring Physician (Ophthalmology) OTHER MD: Earl Lagos, MD (Ophthalmology)   CHIEF COMPLAINT: HER-2 positive breast cancer  CURRENT TREATMENT: Neoadjuvant chemotherapy   INTERVAL HISTORY: Debbie [name] returns today for follow-up and treatment of her HER-2 positive breast cancer.   She continues on neoadjuvant chemotherapy, consisting of carboplatin, docetaxel, and trastuzumab every 21 days x 6. We eliminated the Pertuzumab after cycle 1 because of significant diarrhea complications.  Today is day 1 cycle 5. She notes that her nail beds have begun to hurt. She is not having any neuropathy. She is having some sores in her nose, usually in week two; her nose is bleeding some too. She had a mouth sore, but it usually goes away after a day or so. She notes some minor blurriness in her vision. She has altered taste.   She receives Onpro for WBC support.  Lab Results  Component Value Date   WBC 4.6 02/09/2019   Since her last visit here, she has not undergone any additional studies.     REVIEW OF SYSTEMS: Debbie [name] has been walking for exercise, but she notes that when she gets hot, she dry heaves. She has started doing squats and lunges. She notes that she has been taking her blood pressure at home; yesterday her blood pressure was 132/73 with a pulse of 85. The patient denies unusual headaches or dizziness. There has been no unusual cough, phlegm production, or  pleurisy. This been no change in bowel or bladder habits. The patient denies unexplained weight loss, rash, or fever. A detailed review of systems was otherwise noncontributory.    HISTORY OF CURRENT ILLNESS: From the original intake note:  Pulcifer had routine screening mammography on 10/21/2018 showing a possible abnormality in the right breast. She felt a lump in her breast, possibly dating back to 01/2018, but she didn't think anything about it due to her history with fibrous breasts; she has had fibrous breasts all her life and began to have mammograms at the age of 83.   She underwent right breast ultrasonography at [facility] on 10/28/2018 showing: On physical exam, a firm, fixed mass is palpated in the superior right breast. Targeted ultrasound is performed, showing an irregular hypoechoic mass at the 1 o'clock position 4 cm from the nipple. It measures 2.8 x 2.4 x 1.8 cm. There is associated peripheral and internal vascularity. An oval, circumscribed hypoechoic mass is also identified within the vicinity at the 1 o'clock position 4 cm from the nipple. It measures 1.0 x 0.9 x 0.4 cm. This corresponds with an additional mammographically identified mass in the upper inner quadrant at middle depth. This is mammographically stable dating back to at least 2014. Additional stable, circumscribed masses are scattered throughout the bilateral breasts mammographically. Evaluation of the right axilla demonstrates a single, markedly abnormal lymph node with cortical thickening up to 1 cm. No additional suspicious lymphadenopathy is identified.  Accordingly on 10/28/2018 she proceeded to biopsy of the right breast area in question. The pathology from this procedure showed (  [id number]): invasive ductal carcinoma, nottingham grade II of III, 2.8 cm, 1 o'clock, 4.0 cm from the nipple. Prognostic indicators significant for: estrogen receptor, 90% positive, with strong staining intensity and  progesterone receptor, 0% negative. Proliferation marker Ki67 at 20%. HER2 Positive (3+) by immunohistochemistry.   An additional biopsy of the right axilla was performed on the same day ([id number]) showing:  2. Lymph node, needle/core biopsy, inferior right axilla - Tumor cells within fragmented nodal tissue  The patient's subsequent history is as detailed above.   PAST MEDICAL HISTORY: Past Medical History:  Diagnosis Date  . Anxiety   . Asthma    with severe URIs  . Bronchitis   . Essential hypertension, benign 12/01/2012  . Generalized anxiety disorder 12/01/2012  . Hypertension   . Irritable bowel syndrome (IBS)    "had for years," per patient  . PONV (postoperative nausea and vomiting)   . Seasonal allergies   . Sleep apnea      PAST SURGICAL HISTORY: Past Surgical History:  Procedure Laterality Date  . ABDOMINAL HYSTERECTOMY  2011   with vaginal sling  . ANKLE SURGERY    . BREAST CYST EXCISION Bilateral   . BREAST EXCISIONAL BIOPSY Bilateral   . CHOLECYSTECTOMY  2012  . KNEE ARTHROSCOPY W/ MEDIAL COLLATERAL LIGAMENT (MCL) REPAIR Left 10-07-2012  . PORTACATH PLACEMENT N/A 11/16/2018   Procedure: INSERTION PORT-A-CATH WITH ULTRASOUND;  Surgeon: Alphonsa Overall, MD;  Location: [facility];  Service: General;  Laterality: N/A;  . TONSILLECTOMY  1969     FAMILY HISTORY: Family History  Problem Relation Age of Onset  . Hypertension Mother   . Rheum arthritis Mother   . Heart failure Mother   . Hypertension Father   . Alcoholism Father   . Colon cancer Father   . Lung cancer Father   . Depression Father   . Diabetes Father   . Skin cancer Father   . Hypertension Brother   . Stroke Brother   . Atrial fibrillation Brother   . Hyperlipidemia Maternal Grandfather   . Stroke Paternal Grandmother   . Skin cancer Paternal Grandmother   . Healthy Daughter   . Healthy Daughter   . Breast cancer Neg Hx   . Ovarian cancer Neg Hx   . Prostate cancer Neg Hx   . Pancreatic  cancer Neg Hx    Debbie [name] father died from lung cancer at age 68. Patients' mother died from congestive heart failure at age 92. The patient has 1 brother. Patient denies anyone in her family having breast, ovarian, prostate, or pancreatic cancer. Aubrielle's father was diagnosed with skin cancer, colon cancer, and lung cancer. Debbie [name]'s paternal grandmother was diagnosed with skin cancer.    GYNECOLOGIC HISTORY:  No LMP recorded. Patient has had a hysterectomy. 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 Her husband, Kyliee Ortego, is in [employer]	[place]. They have two daughters, Kerri Perches and [place]. [address] Dawna Part is 24, lives in [place], [place], and works in Building surveyor. Adrian Blackwater [place] is 75, lives in [place], [place], and is currently in school; he currently works part time as a Publishing copy.   ADVANCED DIRECTIVES: In the absence of any documentation, Jolyssa's spouse, [name], is her healthcare power of attorney.      HEALTH MAINTENANCE:  Social History   Tobacco Use  . Smoking status: Never Smoker  . Smokeless tobacco: Never Used  Substance Use Topics  . Alcohol use: Yes    Comment: 2 q mth  . Drug use: No    Colonoscopy: yes, 2015, [facility]   PAP: 2011  Bone density: no Mammography: 10/21/2018  Allergies  Allergen Reactions  . Azithromycin   . Erythromycin Itching  . Levofloxacin     Tendon pain  . Penicillins Swelling and Rash    Did it involve swelling of the face/tongue/throat, SOB, or low BP? Yes Did it involve sudden or severe rash/hives, skin peeling, or any reaction on the inside of your mouth or nose? No Did you need to seek medical attention at a hospital or doctor's office? Yes When did it last  happen?40+ years ago If all above answers are "NO", may proceed with cephalosporin use.     Current Outpatient Medications  Medication Sig Dispense Refill  . AMBULATORY NON FORMULARY MEDICATION Continuous positive airway pressure (CPAP) machine set at autopap, with all supplemental supplies as needed. 1 each 0  . calcium-vitamin D (OSCAL WITH D) 500-200 MG-UNIT tablet Take 1 tablet by mouth daily with breakfast.    . ciprofloxacin (CIPRO) 500 MG tablet Take one tablet twice daily starting day 7 of each chemotherapy cycle; take for 5 days 50 tablet 0  . dexamethasone (DECADRON) 4 MG tablet Take 2 tablets (8 mg total) by mouth 2 (two) times daily. Start the day before Taxotere. Take once the day after, then 2 times a day x 2d. 30 tablet 1  . fexofenadine (ALLEGRA) 180 MG tablet Take 180 mg by mouth daily as needed for allergies or rhinitis.    . fluconazole (DIFLUCAN) 100 MG tablet Take one tablet by mouth every morning starting the day of chemo and continue for 4 days 30 tablet 0  . hydrochlorothiazide (HYDRODIURIL) 12.5 MG tablet Take 1 tablet (12.5 mg total) by mouth daily. 90 tablet 1  . ketoconazole (NIZORAL) 2 % cream Apply to rash in pelvic area daily until cleared 15 g 0  . lidocaine-prilocaine (EMLA) cream Apply to affected area once 30 g 3  . Liraglutide -Weight Management (SAXENDA) 18 MG/3ML SOPN Inject 3 mg into the skin daily. Please include ultra fine needles 24m 15 pen 1  . LORazepam (ATIVAN) 0.5 MG tablet Take 1 tablet (0.5 mg total) by mouth every 6 (six) hours as needed (Nausea or vomiting). 30 tablet 0  . metroNIDAZOLE (FLAGYL) 500 MG tablet Take 1 tablet (500 mg total) by mouth 3 (three) times daily. For 7 days. 21 tablet 0  . metroNIDAZOLE (METROGEL) 1 % gel Apply to facial rash daily until cleared 45 g 0  . montelukast (SINGULAIR) 10 MG tablet TAKE 1 TABLET AT BEDTIME 90 tablet 3  . Multiple Vitamin (MULTIVITAMIN WITH MINERALS) TABS tablet Take 1 tablet by mouth daily.     . pantoprazole (PROTONIX) 40 MG tablet TAKE 1 TABLET DAILY (Patient taking differently: Take 40 mg by mouth daily. ) 90 tablet 4  . potassium chloride (MICRO-K) 10 MEQ CR capsule Take 1 capsule (10 mEq total) by mouth daily. 90 capsule 1  . PROAIR HFA 108 (90 Base) MCG/ACT inhaler USE 2 INHALATIONS EVERY 4 HOURS AS NEEDED FOR WHEEZING (Patient taking differently: Inhale 2 puffs into the lungs every 4 (four) hours as needed for wheezing or shortness of breath. ) 25.5 g 1  . Probiotic Product (VSL#3 PO) Take 1 tablet by  mouth daily.    . prochlorperazine (COMPAZINE) 10 MG tablet Take 1 tablet (10 mg total) by mouth every 6 (six) hours as needed (Nausea or vomiting). 30 tablet 1  . rifaximin (XIFAXAN) 550 MG TABS tablet Take 1 tablet (550 mg total) by mouth 3 (three) times daily. For 14 days. 42 tablet 0  . traMADol (ULTRAM) 50 MG tablet Take 1-2 tablets (50-100 mg total) by mouth every 6 (six) hours as needed. 14 tablet 1  . valACYclovir (VALTREX) 1000 MG tablet Take 1 tablet (1,000 mg total) by mouth 2 (two) times daily as needed (cold sore). TAKE 2 TABLETS EVERY 12 HOURS FOR ONE DAY, TAKE AT THE ONSET OF COLD SORE 21 tablet 1  . venlafaxine XR (EFFEXOR-XR) 75 MG 24 hr capsule Take 1 capsule (75 mg total) by mouth daily with breakfast. 60 capsule 4  . vitamin B-12 (CYANOCOBALAMIN) 1000 MCG tablet Take 1,000 mcg by mouth daily.    Marland Kitchen zolpidem (AMBIEN) 5 MG tablet Take 2.5 mg by mouth at bedtime as needed for sleep.     No current facility-administered medications for this visit.      OBJECTIVE: Middle-aged white woman in no acute distress  Vitals:   02/09/19 0921  BP: (!) 148/82  Pulse: 86  Resp: 20  Temp: 98 F (36.7 C)  SpO2: 98%     Body mass index is 30.62 kg/m.   Wt Readings from Last 3 Encounters:  02/09/19 189 lb 11.2 oz (86 kg)  01/20/19 186 lb (84.4 kg)  12/29/18 185 lb 8 oz (84.1 kg)      ECOG FS:1 - Symptomatic but completely ambulatory  Sclerae unicteric, EOMs intact  No cervical or supraclavicular adenopathy Lungs no rales or rhonchi Heart regular rate and rhythm Abd soft, nontender, positive bowel sounds MSK no focal spinal tenderness, no upper extremity lymphedema Neuro: nonfocal, well oriented, appropriate affect Breasts: No palpable mass in the right breast.  No skin or nipple changes of concern.  Both axillae are benign    LAB RESULTS:  CMP     Component Value Date/Time   NA 142 02/09/2019 0850   K 3.4 (L) 02/09/2019 0850   CL 107 02/09/2019 0850   CO2 27 02/09/2019 0850   GLUCOSE 89 02/09/2019 0850   BUN 11 02/09/2019 0850   CREATININE 0.69 02/09/2019 0850   CREATININE 0.71 01/20/2019 0752   CREATININE 0.66 07/08/2017 1127   CALCIUM 9.0 02/09/2019 0850   PROT 6.0 (L) 02/09/2019 0850   ALBUMIN 3.8 02/09/2019 0850   AST 27 02/09/2019 0850   AST 42 (H) 01/20/2019 0752   ALT 28 02/09/2019 0850   ALT 42 01/20/2019 0752   ALKPHOS 99 02/09/2019 0850   BILITOT 0.6 02/09/2019 0850   BILITOT 0.5 01/20/2019 0752   GFRNONAA >60 02/09/2019 0850   GFRNONAA >60 01/20/2019 0752   GFRNONAA 98 07/08/2017 1127   GFRAA >60 02/09/2019 0850   GFRAA >60 01/20/2019 0752   GFRAA 114 07/08/2017 1127    No results found for: TOTALPROTELP, ALBUMINELP, A1GS, A2GS, BETS, BETA2SER, GAMS, MSPIKE, SPEI  No results found for: KPAFRELGTCHN, LAMBDASER, KAPLAMBRATIO  Lab Results  Component Value Date   WBC 4.6 02/09/2019   NEUTROABS 2.6 02/09/2019   HGB 11.0 (L) 02/09/2019   HCT 32.0 (L) 02/09/2019   MCV 93.0 02/09/2019   PLT 234 02/09/2019    '@LASTCHEMISTRY' @  No results found for: LABCA2  No components found for: LEXNTZ001  No results for input(s): INR in the last  168 hours.  No results found for: LABCA2  No results found for: VZD638  No results found for: VFI433  No results found for: IRJ188  No results found for: CA2729  No components found for: HGQUANT  No results found for: CEA1 / No results found for: CEA1   No results found  for: AFPTUMOR  No results found for: CHROMOGRNA  No results found for: PSA1  Appointment on 02/09/2019  Component Date Value Ref Range Status  . Sodium 02/09/2019 142  135 - 145 mmol/L Final  . Potassium 02/09/2019 3.4* 3.5 - 5.1 mmol/L Final  . Chloride 02/09/2019 107  98 - 111 mmol/L Final  . CO2 02/09/2019 27  22 - 32 mmol/L Final  . Glucose, Bld 02/09/2019 89  70 - 99 mg/dL Final  . BUN 02/09/2019 11  6 - 20 mg/dL Final  . Creatinine, [name] 02/09/2019 0.69  0.44 - 1.00 mg/dL Final  . Calcium 02/09/2019 9.0  8.9 - 10.3 mg/dL Final  . Total Protein 02/09/2019 6.0* 6.5 - 8.1 g/dL Final  . Albumin 02/09/2019 3.8  3.5 - 5.0 g/dL Final  . AST 02/09/2019 27  15 - 41 U/L Final  . ALT 02/09/2019 28  0 - 44 U/L Final  . Alkaline Phosphatase 02/09/2019 99  38 - 126 U/L Final  . Total Bilirubin 02/09/2019 0.6  0.3 - 1.2 mg/dL Final  . GFR calc non Af Amer 02/09/2019 >60  >60 mL/min Final  . GFR calc Af Amer 02/09/2019 >60  >60 mL/min Final  . Anion gap 02/09/2019 8  5 - 15 Final   Performed at [facility], [place] [address] [place], [place] [address]  . WBC 02/09/2019 4.6  4.0 - 10.5 K/uL Final  . RBC 02/09/2019 3.44* 3.87 - 5.11 MIL/uL Final  . Hemoglobin 02/09/2019 11.0* 12.0 - 15.0 g/dL Final  . HCT 02/09/2019 32.0* 36.0 - 46.0 % Final  . MCV 02/09/2019 93.0  80.0 - 100.0 fL Final  . MCH 02/09/2019 32.0  26.0 - 34.0 pg Final  . MCHC 02/09/2019 34.4  30.0 - 36.0 g/dL Final  . RDW 02/09/2019 19.6* 11.5 - 15.5 % Final  . Platelets 02/09/2019 234  150 - 400 K/uL Final  . nRBC 02/09/2019 0.0  0.0 - 0.2 % Final  . Neutrophils Relative % 02/09/2019 56  % Final  . Neutro Abs 02/09/2019 2.6  1.7 - 7.7 K/uL Final  . Lymphocytes Relative 02/09/2019 29  % Final  . Lymphs Abs 02/09/2019 1.3  0.7 - 4.0 K/uL Final  . Monocytes Relative 02/09/2019 14  % Final  . Monocytes Absolute 02/09/2019 0.6  0.1 - 1.0 K/uL Final  . Eosinophils Relative 02/09/2019 0  % Final  .  Eosinophils Absolute 02/09/2019 0.0  0.0 - 0.5 K/uL Final  . Basophils Relative 02/09/2019 1  % Final  . Basophils Absolute 02/09/2019 0.0  0.0 - 0.1 K/uL Final  . Immature Granulocytes 02/09/2019 0  % Final  . Abs Immature Granulocytes 02/09/2019 0.01  0.00 - 0.07 K/uL Final   Performed at [facility], [place] [address] [place], [place] [address]    (this displays the last labs from the last 3 days)  No results found for: TOTALPROTELP, ALBUMINELP, A1GS, A2GS, BETS, BETA2SER, GAMS, MSPIKE, SPEI (this displays SPEP labs)  No results found for: KPAFRELGTCHN, LAMBDASER, KAPLAMBRATIO (kappa/lambda light chains)  No results found for: HGBA, HGBA2QUANT, HGBFQUANT, HGBSQUAN (Hemoglobinopathy evaluation)   No results found for:  LDH  Lab Results  Component Value Date   IRON 46 05/11/2013   TIBC 254 05/11/2013   IRONPCTSAT 18 (L) 05/11/2013   (Iron and TIBC)  Lab Results  Component Value Date   FERRITIN 72 05/11/2013    Urinalysis    Component Value Date/Time   BILIRUBINUR Small 11/09/2018 0914   PROTEINUR Negative 11/09/2018 0914   UROBILINOGEN 0.2 11/09/2018 0914   NITRITE Negative 11/09/2018 0914   LEUKOCYTESUR Negative 11/09/2018 0914     STUDIES:  No results found.   ELIGIBLE FOR AVAILABLE RESEARCH PROTOCOL: no   ASSESSMENT: 58 y.o. Debbie [name], [place] woman status post right breast upper inner quadrant biopsy 10/28/2018 for a clinical T2 N1, stage IIA invasive ductal carcinoma, grade 2, estrogen receptor positive, progesterone receptor negative, HER-2 amplified, with an MIB-1 of 20%  (a) staging work-up with CT scans of the chest abdomen and pelvis and thyroid ultrasonography shows multiple findings that will require follow-up:   (i) two 0.5 cm liver lesions   (ii) possible bladder mass, thickened sigmoid and rectum, and prominent adnexa   (iii) thyroid nodule biopsied 11/19/2018, read as [facility] 3    (iiii)  thyroid lymph node biopsied  11/19/2018 showing no evidence of carcinoma  (1) neoadjuvant chemotherapy will consist of carboplatin, docetaxel, trastuzumab, and Pertuzumab every 21 days x 6 starting 11/17/2018  (a) pertuzumab discontinued after cycle 1 due to severe diarrhea  (2) trastuzumab will be continued to complete 1 year  (a) baseline echocardiogram 11/11/2018 showed an ejection fraction in the 55-60% range  (3) definitive surgery to follow  (4) adjuvant radiation as appropriate  (5) antiestrogens at the completion of local treatment  (6) referral thyroid surgeon at the completion of adjuvant radiation  (7) very small liver abnormalities will require follow-up but are almost certainly benign  (8) viral conjunctivitis diagnosed 01/14/2019   PLAN: Debbie [name] will proceed to her fifth of 6 planned cycles of chemotherapy today.  She is tolerating treatment remarkably well and the most important finding is that she has had no peripheral neuropathy.  She is having some nail dyscrasia.  This will slowly resolve as she gets new nails once she finishes chemo.  She is also having some epistaxis.  I have asked her not to blow her nose, and to keep her nose moist with saline solution.  She is already on Valtrex prophylaxis  She will proceed to treatment today, she will see [place] again in 3 weeks for her final chemo (of course her trastuzumab will continue for a year) and I am setting her up for a breast MRI March 09, 2019.  I will alert her surgeon that we are close to the end of her pre-surgical treatment  She knows to call for any other issue that may develop before the next visit.  Abryana Lykens, Virgie Dad, MD  02/09/19 9:55 AM Medical Oncology and Hematology [facility] [address] [place], [place] [address] Tel. [phone]    Fax. [phone]   I, Jacqualyn Posey am acting as a Education administrator for Chauncey Cruel, MD.   I, Lurline Del MD, have reviewed the above documentation for accuracy and  completeness, and I agree with the above.

## 2019-02-09 ENCOUNTER — Encounter: Payer: Self-pay | Admitting: *Deleted

## 2019-02-09 ENCOUNTER — Other Ambulatory Visit: Payer: Self-pay

## 2019-02-09 ENCOUNTER — Inpatient Hospital Stay: Payer: BC Managed Care – PPO

## 2019-02-09 ENCOUNTER — Inpatient Hospital Stay (HOSPITAL_BASED_OUTPATIENT_CLINIC_OR_DEPARTMENT_OTHER): Payer: BC Managed Care – PPO | Admitting: Oncology

## 2019-02-09 VITALS — BP 148/82 | HR 86 | Temp 98.0°F | Resp 20 | Ht 66.0 in | Wt 189.7 lb

## 2019-02-09 DIAGNOSIS — Z5189 Encounter for other specified aftercare: Secondary | ICD-10-CM | POA: Diagnosis not present

## 2019-02-09 DIAGNOSIS — E041 Nontoxic single thyroid nodule: Secondary | ICD-10-CM | POA: Diagnosis not present

## 2019-02-09 DIAGNOSIS — R040 Epistaxis: Secondary | ICD-10-CM | POA: Diagnosis not present

## 2019-02-09 DIAGNOSIS — Z5111 Encounter for antineoplastic chemotherapy: Secondary | ICD-10-CM | POA: Diagnosis not present

## 2019-02-09 DIAGNOSIS — C50211 Malignant neoplasm of upper-inner quadrant of right female breast: Secondary | ICD-10-CM

## 2019-02-09 DIAGNOSIS — Z170 Estrogen receptor positive status [ER+]: Secondary | ICD-10-CM

## 2019-02-09 DIAGNOSIS — B0053 Herpesviral conjunctivitis: Secondary | ICD-10-CM | POA: Diagnosis not present

## 2019-02-09 DIAGNOSIS — R9389 Abnormal findings on diagnostic imaging of other specified body structures: Secondary | ICD-10-CM | POA: Diagnosis not present

## 2019-02-09 DIAGNOSIS — K769 Liver disease, unspecified: Secondary | ICD-10-CM | POA: Diagnosis not present

## 2019-02-09 DIAGNOSIS — T451X5A Adverse effect of antineoplastic and immunosuppressive drugs, initial encounter: Secondary | ICD-10-CM | POA: Diagnosis not present

## 2019-02-09 DIAGNOSIS — Z5112 Encounter for antineoplastic immunotherapy: Secondary | ICD-10-CM | POA: Diagnosis not present

## 2019-02-09 DIAGNOSIS — Z95828 Presence of other vascular implants and grafts: Secondary | ICD-10-CM

## 2019-02-09 DIAGNOSIS — K521 Toxic gastroenteritis and colitis: Secondary | ICD-10-CM | POA: Diagnosis not present

## 2019-02-09 DIAGNOSIS — R935 Abnormal findings on diagnostic imaging of other abdominal regions, including retroperitoneum: Secondary | ICD-10-CM | POA: Diagnosis not present

## 2019-02-09 LAB — CBC WITH DIFFERENTIAL/PLATELET
Abs Immature Granulocytes: 0.01 10*3/uL (ref 0.00–0.07)
Basophils Absolute: 0 10*3/uL (ref 0.0–0.1)
Basophils Relative: 1 %
Eosinophils Absolute: 0 10*3/uL (ref 0.0–0.5)
Eosinophils Relative: 0 %
HCT: 32 % — ABNORMAL LOW (ref 36.0–46.0)
Hemoglobin: 11 g/dL — ABNORMAL LOW (ref 12.0–15.0)
Immature Granulocytes: 0 %
Lymphocytes Relative: 29 %
Lymphs Abs: 1.3 10*3/uL (ref 0.7–4.0)
MCH: 32 pg (ref 26.0–34.0)
MCHC: 34.4 g/dL (ref 30.0–36.0)
MCV: 93 fL (ref 80.0–100.0)
Monocytes Absolute: 0.6 10*3/uL (ref 0.1–1.0)
Monocytes Relative: 14 %
Neutro Abs: 2.6 10*3/uL (ref 1.7–7.7)
Neutrophils Relative %: 56 %
Platelets: 234 10*3/uL (ref 150–400)
RBC: 3.44 MIL/uL — ABNORMAL LOW (ref 3.87–5.11)
RDW: 19.6 % — ABNORMAL HIGH (ref 11.5–15.5)
WBC: 4.6 10*3/uL (ref 4.0–10.5)
nRBC: 0 % (ref 0.0–0.2)

## 2019-02-09 LAB — COMPREHENSIVE METABOLIC PANEL
ALT: 28 U/L (ref 0–44)
AST: 27 U/L (ref 15–41)
Albumin: 3.8 g/dL (ref 3.5–5.0)
Alkaline Phosphatase: 99 U/L (ref 38–126)
Anion gap: 8 (ref 5–15)
BUN: 11 mg/dL (ref 6–20)
CO2: 27 mmol/L (ref 22–32)
Calcium: 9 mg/dL (ref 8.9–10.3)
Chloride: 107 mmol/L (ref 98–111)
Creatinine, Ser: 0.69 mg/dL (ref 0.44–1.00)
GFR calc Af Amer: >60 mL/min (ref >60)
GFR calc non Af Amer: >60 mL/min (ref >60)
Glucose, Bld: 89 mg/dL (ref 70–99)
Potassium: 3.4 mmol/L — ABNORMAL LOW (ref 3.5–5.1)
Sodium: 142 mmol/L (ref 135–145)
Total Bilirubin: 0.6 mg/dL (ref 0.3–1.2)
Total Protein: 6 g/dL — ABNORMAL LOW (ref 6.5–8.1)

## 2019-02-09 MED ORDER — ACETAMINOPHEN 325 MG PO TABS
650.0000 mg | ORAL_TABLET | Freq: Once | ORAL | Status: AC
  Administered 2019-02-09: 650 mg via ORAL

## 2019-02-09 MED ORDER — SODIUM CHLORIDE 0.9 % IV SOLN
634.0000 mg | Freq: Once | INTRAVENOUS | Status: AC
  Administered 2019-02-09: 630 mg via INTRAVENOUS
  Filled 2019-02-09: qty 63

## 2019-02-09 MED ORDER — DIPHENHYDRAMINE HCL 25 MG PO CAPS
ORAL_CAPSULE | ORAL | Status: AC
  Filled 2019-02-09: qty 1

## 2019-02-09 MED ORDER — SODIUM CHLORIDE 0.9% FLUSH
10.0000 mL | Freq: Once | INTRAVENOUS | Status: AC
  Administered 2019-02-09: 10 mL
  Filled 2019-02-09: qty 10

## 2019-02-09 MED ORDER — PEGFILGRASTIM 6 MG/0.6ML ~~LOC~~ PSKT
PREFILLED_SYRINGE | SUBCUTANEOUS | Status: AC
  Filled 2019-02-09: qty 0.6

## 2019-02-09 MED ORDER — PALONOSETRON HCL INJECTION 0.25 MG/5ML
INTRAVENOUS | Status: AC
  Filled 2019-02-09: qty 5

## 2019-02-09 MED ORDER — TRASTUZUMAB CHEMO 150 MG IV SOLR
6.0000 mg/kg | Freq: Once | INTRAVENOUS | Status: AC
  Administered 2019-02-09: 504 mg via INTRAVENOUS
  Filled 2019-02-09: qty 24

## 2019-02-09 MED ORDER — ACETAMINOPHEN 325 MG PO TABS
ORAL_TABLET | ORAL | Status: AC
  Filled 2019-02-09: qty 2

## 2019-02-09 MED ORDER — HEPARIN SOD (PORK) LOCK FLUSH 100 UNIT/ML IV SOLN
500.0000 [IU] | Freq: Once | INTRAVENOUS | Status: AC | PRN
  Administered 2019-02-09: 500 [IU]
  Filled 2019-02-09: qty 5

## 2019-02-09 MED ORDER — DIPHENHYDRAMINE HCL 25 MG PO CAPS
25.0000 mg | ORAL_CAPSULE | Freq: Once | ORAL | Status: AC
  Administered 2019-02-09: 25 mg via ORAL

## 2019-02-09 MED ORDER — SODIUM CHLORIDE 0.9% FLUSH
10.0000 mL | INTRAVENOUS | Status: DC | PRN
  Administered 2019-02-09: 10 mL
  Filled 2019-02-09: qty 10

## 2019-02-09 MED ORDER — SODIUM CHLORIDE 0.9 % IV SOLN
Freq: Once | INTRAVENOUS | Status: AC
  Administered 2019-02-09: 11:00:00 via INTRAVENOUS
  Filled 2019-02-09: qty 5

## 2019-02-09 MED ORDER — PALONOSETRON HCL INJECTION 0.25 MG/5ML
0.2500 mg | Freq: Once | INTRAVENOUS | Status: AC
  Administered 2019-02-09: 0.25 mg via INTRAVENOUS

## 2019-02-09 MED ORDER — PEGFILGRASTIM 6 MG/0.6ML ~~LOC~~ PSKT
6.0000 mg | PREFILLED_SYRINGE | Freq: Once | SUBCUTANEOUS | Status: AC
  Administered 2019-02-09: 6 mg via SUBCUTANEOUS

## 2019-02-09 MED ORDER — SODIUM CHLORIDE 0.9 % IV SOLN
75.0000 mg/m2 | Freq: Once | INTRAVENOUS | Status: AC
  Administered 2019-02-09: 150 mg via INTRAVENOUS
  Filled 2019-02-09: qty 15

## 2019-02-09 MED ORDER — SODIUM CHLORIDE 0.9 % IV SOLN
Freq: Once | INTRAVENOUS | Status: AC
  Administered 2019-02-09: 10:00:00 via INTRAVENOUS
  Filled 2019-02-09: qty 250

## 2019-02-09 NOTE — Patient Instructions (Signed)
[facility] Discharge Instructions for Patients Receiving Chemotherapy  Today you received the following chemotherapy agents: Herceptin, Taxotere, Carboplatin  To help prevent nausea and vomiting after your treatment, we encourage you to take your nausea medication as directed.   If you develop nausea and vomiting that is not controlled by your nausea medication, call the clinic.   BELOW ARE SYMPTOMS THAT SHOULD BE REPORTED IMMEDIATELY:  *FEVER GREATER THAN 100.5 F  *CHILLS WITH OR WITHOUT FEVER  NAUSEA AND VOMITING THAT IS NOT CONTROLLED WITH YOUR NAUSEA MEDICATION  *UNUSUAL SHORTNESS OF BREATH  *UNUSUAL BRUISING OR BLEEDING  TENDERNESS IN MOUTH AND THROAT WITH OR WITHOUT PRESENCE OF ULCERS  *URINARY PROBLEMS  *BOWEL PROBLEMS  UNUSUAL RASH Items with * indicate a potential emergency and should be followed up as soon as possible.  Feel free to call the clinic should you have any questions or concerns. The clinic phone number is [phone].  Please show the CHEMO ALERT CARD at check-in to the Emergency Department and triage nurse.   

## 2019-02-15 ENCOUNTER — Other Ambulatory Visit: Payer: Self-pay | Admitting: Oncology

## 2019-02-15 DIAGNOSIS — C50211 Malignant neoplasm of upper-inner quadrant of right female breast: Secondary | ICD-10-CM

## 2019-02-15 DIAGNOSIS — Z170 Estrogen receptor positive status [ER+]: Secondary | ICD-10-CM

## 2019-02-16 ENCOUNTER — Encounter: Payer: Self-pay | Admitting: *Deleted

## 2019-02-16 NOTE — Progress Notes (Signed)
Left vm for pt regarding new appt for breast MRI on 7/15 arrival at 9:30 at [facility]. Also sent email with this information and request return call if unable to make appt.

## 2019-03-01 NOTE — Progress Notes (Signed)
[place]  [phone] [phone]    ID: JAEMARIE HOCHBERG DOB: 1960/12/03  MR#: [id number]  VQX#:[id number]  Patient Care Team: Lavada Mesi as PCP - General (Family Medicine) Mauro Kaufmann, RN as Oncology Nurse Navigator Rockwell Germany, RN as Oncology Nurse Navigator Alphonsa Overall, MD as Consulting Physician (General Surgery) , Virgie Dad, MD as Consulting Physician (Oncology) Gery Pray, MD as Consulting Physician (Radiation Oncology) Burden, Lincoln Brigham, MD as Referring Physician (Ophthalmology) Larey Dresser, MD as Consulting Physician (Cardiology) OTHER MD: Earl Lagos, MD (Ophthalmology)   CHIEF COMPLAINT: Debbie [name]  CURRENT TREATMENT: Neoadjuvant chemotherapy   INTERVAL HISTORY: Horace returns today for follow-up and treatment of her Debbie [name].   Debbie [name] continues on neoadjuvant chemotherapy, consisting of carboplatin, docetaxel, and trastuzumab every 21 days x 6. We eliminated the Pertuzumab after cycle 1 because of significant diarrhea complications.  Today is day 1 cycle 6. Debbie [name] reports dizziness, so Debbie [name] began keeping track of her blood pressure. Debbie [name] states Debbie [name] sat down until the dizziness passed. Debbie [name] notes Debbie [name] is probably not drinking enough water. Debbie [name] also reports itching to her face that worsened during week 3.  Debbie [name] receives Onpro for WBC support.  Lab Results  Component Value Date   WBC 3.4 (L) 03/02/2019   WBC 4.6 02/09/2019   WBC 4.5 01/20/2019   WBC 4.4 12/29/2018   WBC 7.0 12/08/2018   Her most recent echocardiogram was on 11/11/2018, which showed an ejection fraction of 55-60%.  Since her last visit here, Debbie [name] has not undergone any additional studies. Debbie [name] is scheduled for bilateral breast MRI tomorrow, 03/03/2019.   REVIEW OF SYSTEMS: Lossie reports Debbie [name] is having trouble sleeping. Debbie [name] also reports a bothersome spot in her "private area." Debbie [name] notes this  improves when Debbie [name] takes the Cipro. A detailed review of systems was otherwise noncontributory.     HISTORY OF CURRENT ILLNESS: From the original intake note:  Deuel had routine screening mammography on 10/21/2018 showing a possible abnormality in the right breast. Debbie [name] felt a lump in her breast, possibly dating back to 01/2018, but Debbie [name] didn't think anything about it due to her history with fibrous breasts; Debbie [name] has had fibrous breasts all her life and began to have mammograms at the age of 58.   Debbie [name] underwent right breast ultrasonography at [facility] on 10/28/2018 showing: On physical exam, a firm, fixed mass is palpated in the superior right breast. Targeted ultrasound is performed, showing an irregular hypoechoic mass at the 1 o'clock position 4 cm from the nipple. It measures 2.8 x 2.4 x 1.8 cm. There is associated peripheral and internal vascularity. An oval, circumscribed hypoechoic mass is also identified within the vicinity at the 1 o'clock position 4 cm from the nipple. It measures 1.0 x 0.9 x 0.4 cm. This corresponds with an additional mammographically identified mass in the upper inner quadrant at middle depth. This is mammographically stable dating back to at least 2014. Additional stable, circumscribed masses are scattered throughout the bilateral breasts mammographically. Evaluation of the right axilla demonstrates a single, markedly abnormal lymph node with cortical thickening up to 1 cm. No additional suspicious lymphadenopathy is identified.  Accordingly on 10/28/2018 Debbie [name] proceeded to biopsy of the right breast area in question. The pathology from this procedure showed ([id number]): invasive ductal carcinoma, nottingham grade II of III, 2.8 cm, 1 o'clock, 4.0 cm from the nipple. Prognostic indicators significant for: estrogen receptor,  90% positive, with strong staining intensity and progesterone receptor, 0% negative. Proliferation marker Ki67 at 20%. HER2 Positive (3+)  by immunohistochemistry.   An additional biopsy of the right axilla was performed on the same day ([id number]) showing:  2. Lymph node, needle/core biopsy, inferior right axilla - Tumor cells within fragmented nodal tissue  The patient's subsequent history is as detailed above.   PAST MEDICAL HISTORY: Past Medical History:  Diagnosis Date  . Anxiety   . Asthma    with severe URIs  . Bronchitis   . Essential hypertension, benign 12/01/2012  . Generalized anxiety disorder 12/01/2012  . Hypertension   . Irritable bowel syndrome (IBS)    "had for years," per patient  . PONV (postoperative nausea and vomiting)   . Seasonal allergies   . Sleep apnea      PAST SURGICAL HISTORY: Past Surgical History:  Procedure Laterality Date  . ABDOMINAL HYSTERECTOMY  2011   with vaginal sling  . ANKLE SURGERY    . BREAST CYST EXCISION Bilateral   . BREAST EXCISIONAL BIOPSY Bilateral   . CHOLECYSTECTOMY  2012  . KNEE ARTHROSCOPY W/ MEDIAL COLLATERAL LIGAMENT (MCL) REPAIR Left 10-07-2012  . PORTACATH PLACEMENT N/A 11/16/2018   Procedure: INSERTION PORT-A-CATH WITH ULTRASOUND;  Surgeon: Alphonsa Overall, MD;  Location: [facility];  Service: General;  Laterality: N/A;  . TONSILLECTOMY  1969     FAMILY HISTORY: Family History  Problem Relation Age of Onset  . Hypertension Mother   . Rheum arthritis Mother   . Heart failure Mother   . Hypertension Father   . Alcoholism Father   . Colon [name] Father   . Lung [name] Father   . Depression Father   . Diabetes Father   . Skin [name] Father   . Hypertension Brother   . Stroke Brother   . Atrial fibrillation Brother   . Hyperlipidemia Maternal Grandfather   . Stroke Paternal Grandmother   . Skin [name] Paternal Grandmother   . Healthy Daughter   . Healthy Daughter   . Breast [name] Neg Hx   . Ovarian [name] Neg Hx   . Prostate [name] Neg Hx   . Pancreatic [name] Neg Hx    Aarti's father died from lung [name] at age 65. Patients' mother died  from congestive heart failure at age 36. The patient has 1 brother. Patient denies anyone in her family having breast, ovarian, prostate, or pancreatic [name]. Fedora's father was diagnosed with skin [name], colon [name], and lung [name]. [name]'s paternal grandmother was diagnosed with skin [name].    GYNECOLOGIC HISTORY:  No LMP recorded. Patient has had a hysterectomy. 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 Her husband, Kaoir Loree, is in [employer]	[place]. They have two daughters, Kerri Perches and Marion. [address] Dawna Part is 21, lives in [place], [place], and works in Building surveyor. Adrian Blackwater Madison is 37, lives in [place], [place], and is currently in school; he currently works part time as a Publishing copy.   ADVANCED DIRECTIVES: In the absence of any documentation, Pressley's spouse, [name], is her healthcare power of attorney.      HEALTH MAINTENANCE: Social History   Tobacco Use  . Smoking status: Never Smoker  . Smokeless tobacco: Never Used  Substance Use Topics  .  Alcohol use: Yes    Comment: 2 q mth  . Drug use: No    Colonoscopy: yes, 2015, [facility]   PAP: 2011  Bone density: no Mammography: 10/21/2018  Allergies  Allergen Reactions  . Azithromycin   . Erythromycin Itching  . Levofloxacin     Tendon pain  . Penicillins Swelling and Rash    Did it involve swelling of the face/tongue/throat, SOB, or low BP? Yes Did it involve sudden or severe rash/hives, skin peeling, or any reaction on the inside of your mouth or nose? No Did you need to seek medical attention at a hospital or doctor's office? Yes When did it last happen?40+ years ago If all above answers are "NO", may proceed with cephalosporin use.      Current Outpatient Medications  Medication Sig Dispense Refill  . AMBULATORY NON FORMULARY MEDICATION Continuous positive airway pressure (CPAP) machine set at autopap, with all supplemental supplies as needed. 1 each 0  . calcium-vitamin D (OSCAL WITH D) 500-200 MG-UNIT tablet Take 1 tablet by mouth daily with breakfast.    . ciprofloxacin (CIPRO) 500 MG tablet Take one tablet twice daily starting day 7 of each chemotherapy cycle; take for 5 days 50 tablet 0  . dexamethasone (DECADRON) 4 MG tablet TAKE 2 TABLETS TWICE A DAY. START THE DAY BEFORE TAXOTERE. TAKE ONCE THE DAY AFTER, THEN TWICE A DAY FOR 2 DAYS 30 tablet 44  . fexofenadine (ALLEGRA) 180 MG tablet Take 180 mg by mouth daily as needed for allergies or rhinitis.    . fluconazole (DIFLUCAN) 100 MG tablet Take one tablet by mouth every morning starting the day of chemo and continue for 4 days 30 tablet 0  . hydrochlorothiazide (HYDRODIURIL) 12.5 MG tablet Take 1 tablet (12.5 mg total) by mouth daily. 90 tablet 1  . ketoconazole (NIZORAL) 2 % cream Apply to rash in pelvic area daily until cleared 15 g 0  . lidocaine-prilocaine (EMLA) cream Apply to affected area once 30 g 3  . Liraglutide -Weight Management (SAXENDA) 18 MG/3ML SOPN Inject 3 mg into the skin daily. Please include ultra fine needles 46m 15 pen 1  . LORazepam (ATIVAN) 0.5 MG tablet Take 1 tablet (0.5 mg total) by mouth every 6 (six) hours as needed (Nausea or vomiting). 30 tablet 0  . metroNIDAZOLE (FLAGYL) 500 MG tablet Take 1 tablet (500 mg total) by mouth 3 (three) times daily. For 7 days. 21 tablet 0  . metroNIDAZOLE (METROGEL) 1 % gel Apply to facial rash daily until cleared 45 g 0  . montelukast (SINGULAIR) 10 MG tablet TAKE 1 TABLET AT BEDTIME 90 tablet 3  . Multiple Vitamin (MULTIVITAMIN WITH MINERALS) TABS tablet Take 1 tablet by mouth daily.    . pantoprazole (PROTONIX) 40 MG tablet TAKE 1 TABLET DAILY (Patient taking differently: Take 40 mg by mouth daily. ) 90  tablet 4  . potassium chloride (MICRO-K) 10 MEQ CR capsule Take 1 capsule (10 mEq total) by mouth daily. 90 capsule 1  . PROAIR HFA 108 (90 Base) MCG/ACT inhaler USE 2 INHALATIONS EVERY 4 HOURS AS NEEDED FOR WHEEZING (Patient taking differently: Inhale 2 puffs into the lungs every 4 (four) hours as needed for wheezing or shortness of breath. ) 25.5 g 1  . Probiotic Product (VSL#3 PO) Take 1 tablet by mouth daily.    . prochlorperazine (COMPAZINE) 10 MG tablet Take 1 tablet (10 mg total) by mouth every 6 (six) hours as needed (Nausea or vomiting). 30 tablet  1  . rifaximin (XIFAXAN) 550 MG TABS tablet Take 1 tablet (550 mg total) by mouth 3 (three) times daily. For 14 days. 42 tablet 0  . traMADol (ULTRAM) 50 MG tablet Take 1-2 tablets (50-100 mg total) by mouth every 6 (six) hours as needed. 14 tablet 1  . valACYclovir (VALTREX) 1000 MG tablet Take 1 tablet (1,000 mg total) by mouth 2 (two) times daily as needed (cold sore). TAKE 2 TABLETS EVERY 12 HOURS FOR ONE DAY, TAKE AT THE ONSET OF COLD SORE 21 tablet 1  . venlafaxine XR (EFFEXOR-XR) 75 MG 24 hr capsule Take 1 capsule (75 mg total) by mouth daily with breakfast. 60 capsule 4  . vitamin B-12 (CYANOCOBALAMIN) 1000 MCG tablet Take 1,000 mcg by mouth daily.    Marland Kitchen zolpidem (AMBIEN) 5 MG tablet Take 2.5 mg by mouth at bedtime as needed for sleep.    Marland Kitchen zolpidem (AMBIEN) 5 MG tablet Take 1 tablet (5 mg total) by mouth at bedtime as needed for sleep. 20 tablet 0   No current facility-administered medications for this visit.      OBJECTIVE: Middle-aged white woman who appears stated age  58:   03/02/19 0919  BP: 134/65  Pulse: 78  Resp: 19  Temp: 98.5 F (36.9 C)  SpO2: 98%     Body mass index is 30.73 kg/m.   Wt Readings from Last 3 Encounters:  03/02/19 190 lb 6.4 oz (86.4 kg)  02/09/19 189 lb 11.2 oz (86 kg)  01/20/19 186 lb (84.4 kg)      ECOG FS:1 - Symptomatic but completely ambulatory  Sclerae unicteric, pupils round and  equal Wearing a mask No cervical or supraclavicular adenopathy Lungs no rales or rhonchi Heart regular rate and rhythm Abd soft, nontender, positive bowel sounds MSK no focal spinal tenderness, no upper extremity lymphedema Neuro: nonfocal, well oriented, appropriate affect Breasts: I do not palpate a mass in the right breast and there are no skin or nipple changes of concern.  The left breast is benign.  Both axillae are benign.    LAB RESULTS:  CMP     Component Value Date/Time   NA 145 03/02/2019 0828   K 3.0 (LL) 03/02/2019 0828   CL 107 03/02/2019 0828   CO2 29 03/02/2019 0828   GLUCOSE 91 03/02/2019 0828   BUN 14 03/02/2019 0828   CREATININE 0.67 03/02/2019 0828   CREATININE 0.71 01/20/2019 0752   CREATININE 0.66 07/08/2017 1127   CALCIUM 8.9 03/02/2019 0828   PROT 5.8 (L) 03/02/2019 0828   ALBUMIN 3.7 03/02/2019 0828   AST 27 03/02/2019 0828   AST 42 (H) 01/20/2019 0752   ALT 22 03/02/2019 0828   ALT 42 01/20/2019 0752   ALKPHOS 97 03/02/2019 0828   BILITOT 0.6 03/02/2019 0828   BILITOT 0.5 01/20/2019 0752   GFRNONAA >60 03/02/2019 0828   GFRNONAA >60 01/20/2019 0752   GFRNONAA 98 07/08/2017 1127   GFRAA >60 03/02/2019 0828   GFRAA >60 01/20/2019 0752   GFRAA 114 07/08/2017 1127    No results found for: TOTALPROTELP, ALBUMINELP, A1GS, A2GS, BETS, BETA2SER, GAMS, MSPIKE, SPEI  No results found for: KPAFRELGTCHN, LAMBDASER, KAPLAMBRATIO  Lab Results  Component Value Date   WBC 3.4 (L) 03/02/2019   NEUTROABS 1.6 (L) 03/02/2019   HGB 10.5 (L) 03/02/2019   HCT 31.2 (L) 03/02/2019   MCV 97.8 03/02/2019   PLT 180 03/02/2019    _0 @  No results found for: LABCA2  No components found  for: [id number]  No results for input(s): INR in the last 168 hours.  No results found for: LABCA2  No results found for: RSW546  No results found for: EVO350  No results found for: KXF818  No results found for: CA2729  No components found for: HGQUANT   No results found for: CEA1 / No results found for: CEA1   No results found for: AFPTUMOR  No results found for: CHROMOGRNA  No results found for: PSA1  Appointment on 03/02/2019  Component Date Value Ref Range Status  . Sodium 03/02/2019 145  135 - 145 mmol/L Final  . Potassium 03/02/2019 3.0* 3.5 - 5.1 mmol/L Final   CRITICAL RESULT CALLED TO, READ BACK BY AND VERIFIED WITH: ROZ WINSTON,RN   . Chloride 03/02/2019 107  98 - 111 mmol/L Final  . CO2 03/02/2019 29  22 - 32 mmol/L Final  . Glucose, Bld 03/02/2019 91  70 - 99 mg/dL Final  . BUN 03/02/2019 14  6 - 20 mg/dL Final  . Creatinine, Ser 03/02/2019 0.67  0.44 - 1.00 mg/dL Final  . Calcium 03/02/2019 8.9  8.9 - 10.3 mg/dL Final  . Total Protein 03/02/2019 5.8* 6.5 - 8.1 g/dL Final  . Albumin 03/02/2019 3.7  3.5 - 5.0 g/dL Final  . AST 03/02/2019 27  15 - 41 U/L Final  . ALT 03/02/2019 22  0 - 44 U/L Final  . Alkaline Phosphatase 03/02/2019 97  38 - 126 U/L Final  . Total Bilirubin 03/02/2019 0.6  0.3 - 1.2 mg/dL Final  . GFR calc non Af Amer 03/02/2019 >60  >60 mL/min Final  . GFR calc Af Amer 03/02/2019 >60  >60 mL/min Final  . Anion gap 03/02/2019 9  5 - 15 Final   Performed at [facility], [place] [address] [place], [place] [address]  . WBC 03/02/2019 3.4* 4.0 - 10.5 K/uL Final  . RBC 03/02/2019 3.19* 3.87 - 5.11 MIL/uL Final  . Hemoglobin 03/02/2019 10.5* 12.0 - 15.0 g/dL Final  . HCT 03/02/2019 31.2* 36.0 - 46.0 % Final  . MCV 03/02/2019 97.8  80.0 - 100.0 fL Final  . MCH 03/02/2019 32.9  26.0 - 34.0 pg Final  . MCHC 03/02/2019 33.7  30.0 - 36.0 g/dL Final  . RDW 03/02/2019 17.3* 11.5 - 15.5 % Final  . Platelets 03/02/2019 180  150 - 400 K/uL Final  . nRBC 03/02/2019 0.0  0.0 - 0.2 % Final  . Neutrophils Relative % 03/02/2019 47  % Final  . Neutro Abs 03/02/2019 1.6* 1.7 - 7.7 K/uL Final  . Lymphocytes Relative 03/02/2019 34  % Final  . Lymphs Abs 03/02/2019 1.2  0.7 - 4.0 K/uL Final  .  Monocytes Relative 03/02/2019 16  % Final  . Monocytes Absolute 03/02/2019 0.5  0.1 - 1.0 K/uL Final  . Eosinophils Relative 03/02/2019 2  % Final  . Eosinophils Absolute 03/02/2019 0.1  0.0 - 0.5 K/uL Final  . Basophils Relative 03/02/2019 1  % Final  . Basophils Absolute 03/02/2019 0.0  0.0 - 0.1 K/uL Final  . Immature Granulocytes 03/02/2019 0  % Final  . Abs Immature Granulocytes 03/02/2019 0.01  0.00 - 0.07 K/uL Final   Performed at [facility], [place] [address], [place], [place] [address]    (this displays the last labs from the last 3 days)  No results found for: TOTALPROTELP, ALBUMINELP, A1GS, A2GS, BETS, BETA2SER, GAMS, MSPIKE, SPEI (this displays SPEP labs)  No results found  for: KPAFRELGTCHN, LAMBDASER, KAPLAMBRATIO (kappa/lambda light chains)  No results found for: HGBA, HGBA2QUANT, HGBFQUANT, HGBSQUAN (Hemoglobinopathy evaluation)   No results found for: LDH  Lab Results  Component Value Date   IRON 46 05/11/2013   TIBC 254 05/11/2013   IRONPCTSAT 18 (L) 05/11/2013   (Iron and TIBC)  Lab Results  Component Value Date   FERRITIN 72 05/11/2013    Urinalysis    Component Value Date/Time   BILIRUBINUR Small 11/09/2018 0914   PROTEINUR Negative 11/09/2018 0914   UROBILINOGEN 0.2 11/09/2018 0914   NITRITE Negative 11/09/2018 0914   LEUKOCYTESUR Negative 11/09/2018 0914     STUDIES:  No results found.   ELIGIBLE FOR AVAILABLE RESEARCH PROTOCOL: no   ASSESSMENT: 58 y.o. Jule Ser, [place] woman status post right breast upper inner quadrant biopsy 10/28/2018 for a clinical T2 N1, stage IIA invasive ductal carcinoma, grade 2, estrogen receptor positive, progesterone receptor negative, Debbie amplified, with an MIB-1 of 20%  (a) staging work-up with CT scans of the chest abdomen and pelvis and thyroid ultrasonography shows multiple findings that will require follow-up:   (i) two 0.5 cm liver lesions   (ii) possible bladder mass,  thickened sigmoid and rectum, and prominent adnexa   (iii) thyroid nodule biopsied 11/19/2018, read as [facility] 3    (iiii)  thyroid lymph node biopsied 11/19/2018 showing no evidence of carcinoma  (1) neoadjuvant chemotherapy will consist of carboplatin, docetaxel, trastuzumab, and Pertuzumab every 21 days x 6 starting 11/17/2018  (a) pertuzumab discontinued after cycle 1 due to severe diarrhea  (2) trastuzumab will be continued to complete 1 year  (a) baseline echocardiogram 11/11/2018 showed an ejection fraction in the 55-60% range  (3) definitive surgery to follow  (4) adjuvant radiation as appropriate  (5) antiestrogens at the completion of local treatment  (6) referral thyroid surgeon at the completion of adjuvant radiation  (7) very small liver abnormalities will require follow-up but are almost certainly benign  (8) viral conjunctivitis diagnosed 01/14/2019   PLAN: Vickii Chafe completes her chemotherapy today.  Debbie [name] generally has done well with treatment.  Debbie [name] is behind on echoes and I have gone ahead and entered the order.  I am also referring her to our cardio oncology specialists so they can help [place] continue to follow while Debbie [name] completes her year of Herceptin.  Debbie [name] is scheduled for breast MRI tomorrow.  I am looking for good news  I reviewed her blood pressures at home.  They are slightly low but within the normal range, without a significant rise in her heart rate.  Debbie [name] understands that chemotherapy does affect the autonomic nervous system.  I do expect her blood pressure to rise slightly over the next 2 months back to her baseline.  In the meantime Debbie [name] does need to hydrate herself a little bit better  Debbie [name] requested a refill on the Valtrex and Ambien.  I was glad to provide that for her.  I think Debbie [name] has some a bony and cyst.  It should improve with Cipro.  I will ask our 24 assistant to take a quick look when the patient returns in 3 weeks  [name] knows to call for any  other issue that may develop before the next visit.   , Virgie Dad, MD  03/02/19 10:03 AM Medical Oncology and Hematology [facility] [address] [place], [place] [address] Tel. [phone]    Fax. [phone]    I, Wilburn Mylar, am acting as scribe for Dr. Virgie Dad. .  I, Lurline Del MD, have reviewed the above documentation for accuracy and completeness, and I agree with the above.

## 2019-03-02 ENCOUNTER — Encounter: Payer: Self-pay | Admitting: *Deleted

## 2019-03-02 ENCOUNTER — Telehealth: Payer: Self-pay | Admitting: *Deleted

## 2019-03-02 ENCOUNTER — Inpatient Hospital Stay: Payer: BC Managed Care – PPO

## 2019-03-02 ENCOUNTER — Inpatient Hospital Stay: Payer: BC Managed Care – PPO | Attending: Oncology

## 2019-03-02 ENCOUNTER — Inpatient Hospital Stay (HOSPITAL_BASED_OUTPATIENT_CLINIC_OR_DEPARTMENT_OTHER): Payer: BC Managed Care – PPO | Admitting: Oncology

## 2019-03-02 ENCOUNTER — Other Ambulatory Visit: Payer: Self-pay

## 2019-03-02 VITALS — BP 134/65 | HR 78 | Temp 98.5°F | Resp 19 | Ht 66.0 in | Wt 190.4 lb

## 2019-03-02 DIAGNOSIS — K589 Irritable bowel syndrome without diarrhea: Secondary | ICD-10-CM

## 2019-03-02 DIAGNOSIS — Z5111 Encounter for antineoplastic chemotherapy: Secondary | ICD-10-CM | POA: Diagnosis not present

## 2019-03-02 DIAGNOSIS — G4730 Sleep apnea, unspecified: Secondary | ICD-10-CM

## 2019-03-02 DIAGNOSIS — Z170 Estrogen receptor positive status [ER+]: Secondary | ICD-10-CM

## 2019-03-02 DIAGNOSIS — Z803 Family history of malignant neoplasm of breast: Secondary | ICD-10-CM

## 2019-03-02 DIAGNOSIS — I10 Essential (primary) hypertension: Secondary | ICD-10-CM | POA: Diagnosis not present

## 2019-03-02 DIAGNOSIS — C50211 Malignant neoplasm of upper-inner quadrant of right female breast: Secondary | ICD-10-CM | POA: Diagnosis not present

## 2019-03-02 DIAGNOSIS — J45909 Unspecified asthma, uncomplicated: Secondary | ICD-10-CM

## 2019-03-02 DIAGNOSIS — Z5112 Encounter for antineoplastic immunotherapy: Secondary | ICD-10-CM | POA: Insufficient documentation

## 2019-03-02 DIAGNOSIS — Z79899 Other long term (current) drug therapy: Secondary | ICD-10-CM | POA: Diagnosis not present

## 2019-03-02 DIAGNOSIS — Z95828 Presence of other vascular implants and grafts: Secondary | ICD-10-CM

## 2019-03-02 LAB — COMPREHENSIVE METABOLIC PANEL
ALT: 22 U/L (ref 0–44)
AST: 27 U/L (ref 15–41)
Albumin: 3.7 g/dL (ref 3.5–5.0)
Alkaline Phosphatase: 97 U/L (ref 38–126)
Anion gap: 9 (ref 5–15)
BUN: 14 mg/dL (ref 6–20)
CO2: 29 mmol/L (ref 22–32)
Calcium: 8.9 mg/dL (ref 8.9–10.3)
Chloride: 107 mmol/L (ref 98–111)
Creatinine, Ser: 0.67 mg/dL (ref 0.44–1.00)
GFR calc Af Amer: >60 mL/min (ref >60)
GFR calc non Af Amer: >60 mL/min (ref >60)
Glucose, Bld: 91 mg/dL (ref 70–99)
Potassium: 3 mmol/L — CL (ref 3.5–5.1)
Sodium: 145 mmol/L (ref 135–145)
Total Bilirubin: 0.6 mg/dL (ref 0.3–1.2)
Total Protein: 5.8 g/dL — ABNORMAL LOW (ref 6.5–8.1)

## 2019-03-02 LAB — CBC WITH DIFFERENTIAL/PLATELET
Abs Immature Granulocytes: 0.01 10*3/uL (ref 0.00–0.07)
Basophils Absolute: 0 10*3/uL (ref 0.0–0.1)
Basophils Relative: 1 %
Eosinophils Absolute: 0.1 10*3/uL (ref 0.0–0.5)
Eosinophils Relative: 2 %
HCT: 31.2 % — ABNORMAL LOW (ref 36.0–46.0)
Hemoglobin: 10.5 g/dL — ABNORMAL LOW (ref 12.0–15.0)
Immature Granulocytes: 0 %
Lymphocytes Relative: 34 %
Lymphs Abs: 1.2 10*3/uL (ref 0.7–4.0)
MCH: 32.9 pg (ref 26.0–34.0)
MCHC: 33.7 g/dL (ref 30.0–36.0)
MCV: 97.8 fL (ref 80.0–100.0)
Monocytes Absolute: 0.5 10*3/uL (ref 0.1–1.0)
Monocytes Relative: 16 %
Neutro Abs: 1.6 10*3/uL — ABNORMAL LOW (ref 1.7–7.7)
Neutrophils Relative %: 47 %
Platelets: 180 10*3/uL (ref 150–400)
RBC: 3.19 MIL/uL — ABNORMAL LOW (ref 3.87–5.11)
RDW: 17.3 % — ABNORMAL HIGH (ref 11.5–15.5)
WBC: 3.4 10*3/uL — ABNORMAL LOW (ref 4.0–10.5)
nRBC: 0 % (ref 0.0–0.2)

## 2019-03-02 MED ORDER — DIPHENHYDRAMINE HCL 25 MG PO CAPS
ORAL_CAPSULE | ORAL | Status: AC
  Filled 2019-03-02: qty 1

## 2019-03-02 MED ORDER — ACETAMINOPHEN 325 MG PO TABS
650.0000 mg | ORAL_TABLET | Freq: Once | ORAL | Status: AC
  Administered 2019-03-02: 650 mg via ORAL

## 2019-03-02 MED ORDER — SODIUM CHLORIDE 0.9% FLUSH
10.0000 mL | INTRAVENOUS | Status: DC | PRN
  Administered 2019-03-02: 14:00:00 10 mL
  Filled 2019-03-02: qty 10

## 2019-03-02 MED ORDER — PALONOSETRON HCL INJECTION 0.25 MG/5ML
INTRAVENOUS | Status: AC
  Filled 2019-03-02: qty 5

## 2019-03-02 MED ORDER — VALACYCLOVIR HCL 1 G PO TABS: 1000.0000 mg | ORAL_TABLET | Freq: Two times a day (BID) | ORAL | 1 refills | Status: DC | PRN

## 2019-03-02 MED ORDER — PEGFILGRASTIM 6 MG/0.6ML ~~LOC~~ PSKT
PREFILLED_SYRINGE | SUBCUTANEOUS | Status: AC
  Filled 2019-03-02: qty 0.6

## 2019-03-02 MED ORDER — TRASTUZUMAB CHEMO 150 MG IV SOLR
6.0000 mg/kg | Freq: Once | INTRAVENOUS | Status: AC
  Administered 2019-03-02: 504 mg via INTRAVENOUS
  Filled 2019-03-02: qty 24

## 2019-03-02 MED ORDER — SODIUM CHLORIDE 0.9 % IV SOLN
Freq: Once | INTRAVENOUS | Status: AC
  Administered 2019-03-02: 11:00:00 via INTRAVENOUS
  Filled 2019-03-02: qty 5

## 2019-03-02 MED ORDER — SODIUM CHLORIDE 0.9 % IV SOLN
Freq: Once | INTRAVENOUS | Status: AC
  Administered 2019-03-02: 10:00:00 via INTRAVENOUS
  Filled 2019-03-02: qty 250

## 2019-03-02 MED ORDER — ZOLPIDEM TARTRATE 5 MG PO TABS: 5.0000 mg | ORAL_TABLET | Freq: Every evening | ORAL | 0 refills | Status: DC | PRN

## 2019-03-02 MED ORDER — PALONOSETRON HCL INJECTION 0.25 MG/5ML
0.2500 mg | Freq: Once | INTRAVENOUS | Status: AC
  Administered 2019-03-02: 10:00:00 0.25 mg via INTRAVENOUS

## 2019-03-02 MED ORDER — SODIUM CHLORIDE 0.9 % IV SOLN
75.0000 mg/m2 | Freq: Once | INTRAVENOUS | Status: AC
  Administered 2019-03-02: 150 mg via INTRAVENOUS
  Filled 2019-03-02: qty 15

## 2019-03-02 MED ORDER — ACETAMINOPHEN 325 MG PO TABS
ORAL_TABLET | ORAL | Status: AC
  Filled 2019-03-02: qty 2

## 2019-03-02 MED ORDER — PEGFILGRASTIM 6 MG/0.6ML ~~LOC~~ PSKT
6.0000 mg | PREFILLED_SYRINGE | Freq: Once | SUBCUTANEOUS | Status: AC
  Administered 2019-03-02: 14:00:00 6 mg via SUBCUTANEOUS

## 2019-03-02 MED ORDER — SODIUM CHLORIDE 0.9% FLUSH
10.0000 mL | Freq: Once | INTRAVENOUS | Status: AC
  Administered 2019-03-02: 10 mL
  Filled 2019-03-02: qty 10

## 2019-03-02 MED ORDER — DIPHENHYDRAMINE HCL 25 MG PO CAPS
25.0000 mg | ORAL_CAPSULE | Freq: Once | ORAL | Status: AC
  Administered 2019-03-02: 25 mg via ORAL

## 2019-03-02 MED ORDER — HEPARIN SOD (PORK) LOCK FLUSH 100 UNIT/ML IV SOLN
500.0000 [IU] | Freq: Once | INTRAVENOUS | Status: AC | PRN
  Administered 2019-03-02: 500 [IU]
  Filled 2019-03-02: qty 5

## 2019-03-02 MED ORDER — SODIUM CHLORIDE 0.9 % IV SOLN
634.0000 mg | Freq: Once | INTRAVENOUS | Status: AC
  Administered 2019-03-02: 13:00:00 630 mg via INTRAVENOUS
  Filled 2019-03-02: qty 63

## 2019-03-02 NOTE — Patient Instructions (Addendum)
**Per Dr. Jana Hakim: Please increase oral potassium supplement to 8mEq daily for 3 days, then resume back to 78mEq afterwards**  [facility] Discharge Instructions for Patients Receiving Chemotherapy  Today you received the following chemotherapy agents: Trastuzumab (Herceptin), Docetaxel (Taxotere), and Carboplatin (Paraplatin)  To help prevent nausea and vomiting after your treatment, we encourage you to take your nausea medication as directed.   If you develop nausea and vomiting that is not controlled by your nausea medication, call the clinic.   BELOW ARE SYMPTOMS THAT SHOULD BE REPORTED IMMEDIATELY:  *FEVER GREATER THAN 100.5 F  *CHILLS WITH OR WITHOUT FEVER  NAUSEA AND VOMITING THAT IS NOT CONTROLLED WITH YOUR NAUSEA MEDICATION  *UNUSUAL SHORTNESS OF BREATH  *UNUSUAL BRUISING OR BLEEDING  TENDERNESS IN MOUTH AND THROAT WITH OR WITHOUT PRESENCE OF ULCERS  *URINARY PROBLEMS  *BOWEL PROBLEMS  UNUSUAL RASH Items with * indicate a potential emergency and should be followed up as soon as possible.  Feel free to call the clinic should you have any questions or concerns. The clinic phone number is [phone].  Please show the [place] at check-in to the Emergency Department and triage nurse.  Coronavirus (COVID-19) Are you at risk?  Are you at risk for the Coronavirus (COVID-19)?  To be considered HIGH RISK for Coronavirus (COVID-19), you have to meet the following criteria:  . Traveled to [place], [place], [place], [place] or [place]; or in the [place] to [place], [place], [place], or [place]; and have fever, cough, and shortness of breath within the last 2 weeks of travel OR . Been in close contact with a person diagnosed with COVID-19 within the last 2 weeks and have fever, cough, and shortness of breath . IF YOU DO NOT MEET THESE CRITERIA, YOU ARE CONSIDERED LOW RISK FOR COVID-19.  What to do if you are HIGH RISK for  COVID-19?  Marland Kitchen If you are having a medical emergency, call 911. . Seek medical care right away. Before you go to a doctor's office, urgent care or emergency department, call ahead and tell them about your recent travel, contact with someone diagnosed with COVID-19, and your symptoms. You should receive instructions from your physician's office regarding next steps of care.  . When you arrive at healthcare provider, tell the healthcare staff immediately you have returned from visiting [place], [place], [place], [place] or [place]; or traveled in the [place] to [place], [place], [place], or [place]; in the last two weeks or you have been in close contact with a person diagnosed with COVID-19 in the last 2 weeks.   . Tell the health care staff about your symptoms: fever, cough and shortness of breath. . After you have been seen by a medical provider, you will be either: o Tested for (COVID-19) and discharged home on quarantine except to seek medical care if symptoms worsen, and asked to  - Stay home and avoid contact with others until you get your results (4-5 days)  - Avoid travel on public transportation if possible (such as bus, train, or airplane) or o Sent to the Emergency Department by EMS for evaluation, COVID-19 testing, and possible admission depending on your condition and test results.  What to do if you are LOW RISK for COVID-19?  Reduce your risk of any infection by using the same precautions used for avoiding the common cold or flu:  Marland Kitchen Wash your hands often with soap and warm water for at least  20 seconds.  If soap and water are not readily available, use an alcohol-based hand sanitizer with at least 60% alcohol.  . If coughing or sneezing, cover your mouth and nose by coughing or sneezing into the elbow areas of your shirt or coat, into a tissue or into your sleeve (not your hands). . Avoid shaking hands with others and consider head nods or verbal greetings only. . Avoid  touching your eyes, nose, or mouth with unwashed hands.  . Avoid close contact with people who are sick. . Avoid places or events with large numbers of people in one location, like concerts or sporting events. . Carefully consider travel plans you have or are making. . If you are planning any travel outside or inside the [place], visit the CDC's Travelers' Health webpage for the latest health notices. . If you have some symptoms but not all symptoms, continue to monitor at home and seek medical attention if your symptoms worsen. . If you are having a medical emergency, call 911.   [place] / e-Visit: [email or web address]         [facility]: [facility]: [phone]                   [facility]: [phone]

## 2019-03-02 NOTE — Addendum Note (Signed)
Addended by: Chauncey Cruel on: 03/02/2019 04:14 PM   Modules accepted: Orders

## 2019-03-02 NOTE — Patient Instructions (Signed)

## 2019-03-02 NOTE — Telephone Encounter (Addendum)
Received call report from [facility].  "Today's K+ = 3.0."  Phone encounter note walked over to deliver results.  Scheduled provider F/U today.   Verified with pt she is currently on 10 mEq daily at home- per MD review recommendation given to take 20 mEq daily x 3 days then resume 10 mEq daily.  Informed treatment room nurse of above.

## 2019-03-03 ENCOUNTER — Ambulatory Visit (HOSPITAL_COMMUNITY)
Admission: RE | Admit: 2019-03-03 | Discharge: 2019-03-03 | Disposition: A | Discharge status: Home / Self Care | Payer: BC Managed Care – PPO | Source: Ambulatory Visit | Attending: Oncology | Admitting: Oncology

## 2019-03-03 DIAGNOSIS — C50211 Malignant neoplasm of upper-inner quadrant of right female breast: Secondary | ICD-10-CM

## 2019-03-03 DIAGNOSIS — Z170 Estrogen receptor positive status [ER+]: Secondary | ICD-10-CM

## 2019-03-04 ENCOUNTER — Ambulatory Visit (HOSPITAL_COMMUNITY)
Admission: RE | Admit: 2019-03-04 | Discharge: 2019-03-04 | Disposition: A | Discharge status: Home / Self Care | Payer: BC Managed Care – PPO | Source: Ambulatory Visit | Attending: Oncology | Admitting: Oncology

## 2019-03-04 ENCOUNTER — Telehealth: Payer: Self-pay | Admitting: Oncology

## 2019-03-04 ENCOUNTER — Other Ambulatory Visit: Payer: Self-pay | Admitting: Oncology

## 2019-03-04 ENCOUNTER — Other Ambulatory Visit: Payer: Self-pay

## 2019-03-04 DIAGNOSIS — C50211 Malignant neoplasm of upper-inner quadrant of right female breast: Secondary | ICD-10-CM | POA: Diagnosis not present

## 2019-03-04 DIAGNOSIS — Z170 Estrogen receptor positive status [ER+]: Secondary | ICD-10-CM | POA: Insufficient documentation

## 2019-03-04 DIAGNOSIS — C50919 Malignant neoplasm of unspecified site of unspecified female breast: Secondary | ICD-10-CM | POA: Diagnosis not present

## 2019-03-04 DIAGNOSIS — Z01818 Encounter for other preprocedural examination: Secondary | ICD-10-CM | POA: Diagnosis not present

## 2019-03-04 MED ORDER — GADOBUTROL 1 MMOL/ML IV SOLN
8.0000 mL | Freq: Once | INTRAVENOUS | Status: AC | PRN
  Administered 2019-03-04: 8 mL via INTRAVENOUS

## 2019-03-04 NOTE — Progress Notes (Signed)
I called Debbie [name] and gave her the good news on the breast MRI.

## 2019-03-04 NOTE — Telephone Encounter (Signed)
I talk with patient regarding schedule and she was questioning treatment

## 2019-03-05 ENCOUNTER — Encounter: Payer: Self-pay | Admitting: *Deleted

## 2019-03-05 DIAGNOSIS — H04123 Dry eye syndrome of bilateral lacrimal glands: Secondary | ICD-10-CM | POA: Diagnosis not present

## 2019-03-05 DIAGNOSIS — B001 Herpesviral vesicular dermatitis: Secondary | ICD-10-CM | POA: Diagnosis not present

## 2019-03-05 DIAGNOSIS — H01003 Unspecified blepharitis right eye, unspecified eyelid: Secondary | ICD-10-CM | POA: Diagnosis not present

## 2019-03-05 DIAGNOSIS — H2513 Age-related nuclear cataract, bilateral: Secondary | ICD-10-CM | POA: Diagnosis not present

## 2019-03-05 DIAGNOSIS — Z8669 Personal history of other diseases of the nervous system and sense organs: Secondary | ICD-10-CM | POA: Diagnosis not present

## 2019-03-05 DIAGNOSIS — Z09 Encounter for follow-up examination after completed treatment for conditions other than malignant neoplasm: Secondary | ICD-10-CM | POA: Diagnosis not present

## 2019-03-05 DIAGNOSIS — H25813 Combined forms of age-related cataract, bilateral: Secondary | ICD-10-CM | POA: Diagnosis not present

## 2019-03-05 DIAGNOSIS — H01006 Unspecified blepharitis left eye, unspecified eyelid: Secondary | ICD-10-CM | POA: Diagnosis not present

## 2019-03-05 DIAGNOSIS — H1789 Other corneal scars and opacities: Secondary | ICD-10-CM | POA: Diagnosis not present

## 2019-03-05 DIAGNOSIS — H179 Unspecified corneal scar and opacity: Secondary | ICD-10-CM | POA: Diagnosis not present

## 2019-03-05 DIAGNOSIS — B308 Other viral conjunctivitis: Secondary | ICD-10-CM | POA: Diagnosis not present

## 2019-03-05 DIAGNOSIS — H02883 Meibomian gland dysfunction of right eye, unspecified eyelid: Secondary | ICD-10-CM | POA: Diagnosis not present

## 2019-03-05 DIAGNOSIS — H02886 Meibomian gland dysfunction of left eye, unspecified eyelid: Secondary | ICD-10-CM | POA: Diagnosis not present

## 2019-03-05 DIAGNOSIS — R6889 Other general symptoms and signs: Secondary | ICD-10-CM | POA: Diagnosis not present

## 2019-03-05 DIAGNOSIS — Z8619 Personal history of other infectious and parasitic diseases: Secondary | ICD-10-CM | POA: Diagnosis not present

## 2019-03-08 ENCOUNTER — Other Ambulatory Visit: Payer: Self-pay | Admitting: Physician Assistant

## 2019-03-09 ENCOUNTER — Ambulatory Visit (HOSPITAL_COMMUNITY): Payer: BC Managed Care – PPO

## 2019-03-10 ENCOUNTER — Ambulatory Visit (HOSPITAL_COMMUNITY): Payer: BC Managed Care – PPO | Attending: Cardiovascular Disease

## 2019-03-10 ENCOUNTER — Other Ambulatory Visit: Payer: Self-pay

## 2019-03-10 ENCOUNTER — Other Ambulatory Visit (HOSPITAL_COMMUNITY): Payer: Self-pay | Admitting: Surgery

## 2019-03-10 DIAGNOSIS — Z170 Estrogen receptor positive status [ER+]: Secondary | ICD-10-CM | POA: Diagnosis not present

## 2019-03-10 DIAGNOSIS — C50211 Malignant neoplasm of upper-inner quadrant of right female breast: Secondary | ICD-10-CM | POA: Diagnosis not present

## 2019-03-10 DIAGNOSIS — C50911 Malignant neoplasm of unspecified site of right female breast: Secondary | ICD-10-CM | POA: Diagnosis not present

## 2019-03-10 DIAGNOSIS — E041 Nontoxic single thyroid nodule: Secondary | ICD-10-CM | POA: Diagnosis not present

## 2019-03-11 ENCOUNTER — Encounter: Payer: Self-pay | Admitting: *Deleted

## 2019-03-11 ENCOUNTER — Other Ambulatory Visit (HOSPITAL_COMMUNITY): Payer: Self-pay | Admitting: Surgery

## 2019-03-11 DIAGNOSIS — C50911 Malignant neoplasm of unspecified site of right female breast: Secondary | ICD-10-CM

## 2019-03-20 HISTORY — PX: BREAST LUMPECTOMY: SHX2

## 2019-03-22 NOTE — Progress Notes (Signed)
The following biosimilar Ogivri (trastuzumab-dkst) has been selected for use in this patient.  Kennith Center, Pharm.D., CPP 03/22/2019@11 :37 AM

## 2019-03-23 ENCOUNTER — Encounter: Payer: Self-pay | Admitting: Adult Health

## 2019-03-23 ENCOUNTER — Inpatient Hospital Stay: Payer: BC Managed Care – PPO

## 2019-03-23 ENCOUNTER — Encounter: Payer: Self-pay | Admitting: *Deleted

## 2019-03-23 ENCOUNTER — Inpatient Hospital Stay (HOSPITAL_BASED_OUTPATIENT_CLINIC_OR_DEPARTMENT_OTHER): Payer: BC Managed Care – PPO | Admitting: Adult Health

## 2019-03-23 ENCOUNTER — Inpatient Hospital Stay: Payer: BC Managed Care – PPO | Attending: Oncology

## 2019-03-23 ENCOUNTER — Telehealth: Payer: Self-pay

## 2019-03-23 ENCOUNTER — Other Ambulatory Visit: Payer: Self-pay

## 2019-03-23 VITALS — BP 132/70 | HR 74 | Temp 99.1°F | Resp 18 | Ht 66.0 in | Wt 190.0 lb

## 2019-03-23 DIAGNOSIS — Z79899 Other long term (current) drug therapy: Secondary | ICD-10-CM | POA: Diagnosis not present

## 2019-03-23 DIAGNOSIS — Z95828 Presence of other vascular implants and grafts: Secondary | ICD-10-CM

## 2019-03-23 DIAGNOSIS — C50211 Malignant neoplasm of upper-inner quadrant of right female breast: Secondary | ICD-10-CM

## 2019-03-23 DIAGNOSIS — Z5112 Encounter for antineoplastic immunotherapy: Secondary | ICD-10-CM | POA: Diagnosis not present

## 2019-03-23 DIAGNOSIS — Z170 Estrogen receptor positive status [ER+]: Secondary | ICD-10-CM

## 2019-03-23 LAB — COMPREHENSIVE METABOLIC PANEL
ALT: 22 U/L (ref 0–44)
AST: 27 U/L (ref 15–41)
Albumin: 3.9 g/dL (ref 3.5–5.0)
Alkaline Phosphatase: 105 U/L (ref 38–126)
Anion gap: 8 (ref 5–15)
BUN: 10 mg/dL (ref 6–20)
CO2: 27 mmol/L (ref 22–32)
Calcium: 9.4 mg/dL (ref 8.9–10.3)
Chloride: 108 mmol/L (ref 98–111)
Creatinine, Ser: 0.65 mg/dL (ref 0.44–1.00)
GFR calc Af Amer: >60 mL/min (ref >60)
GFR calc non Af Amer: >60 mL/min (ref >60)
Glucose, Bld: 90 mg/dL (ref 70–99)
Potassium: 3.3 mmol/L — ABNORMAL LOW (ref 3.5–5.1)
Sodium: 143 mmol/L (ref 135–145)
Total Bilirubin: 0.7 mg/dL (ref 0.3–1.2)
Total Protein: 5.9 g/dL — ABNORMAL LOW (ref 6.5–8.1)

## 2019-03-23 LAB — CBC WITH DIFFERENTIAL/PLATELET
Abs Immature Granulocytes: 0.02 10*3/uL (ref 0.00–0.07)
Basophils Absolute: 0 10*3/uL (ref 0.0–0.1)
Basophils Relative: 1 %
Eosinophils Absolute: 0 10*3/uL (ref 0.0–0.5)
Eosinophils Relative: 1 %
HCT: 30.9 % — ABNORMAL LOW (ref 36.0–46.0)
Hemoglobin: 10.4 g/dL — ABNORMAL LOW (ref 12.0–15.0)
Immature Granulocytes: 1 %
Lymphocytes Relative: 35 %
Lymphs Abs: 1 10*3/uL (ref 0.7–4.0)
MCH: 32.9 pg (ref 26.0–34.0)
MCHC: 33.7 g/dL (ref 30.0–36.0)
MCV: 97.8 fL (ref 80.0–100.0)
Monocytes Absolute: 0.4 10*3/uL (ref 0.1–1.0)
Monocytes Relative: 15 %
Neutro Abs: 1.3 10*3/uL — ABNORMAL LOW (ref 1.7–7.7)
Neutrophils Relative %: 47 %
Platelets: 153 10*3/uL (ref 150–400)
RBC: 3.16 MIL/uL — ABNORMAL LOW (ref 3.87–5.11)
RDW: 15.7 % — ABNORMAL HIGH (ref 11.5–15.5)
WBC: 2.8 10*3/uL — ABNORMAL LOW (ref 4.0–10.5)
nRBC: 0 % (ref 0.0–0.2)

## 2019-03-23 MED ORDER — SODIUM CHLORIDE 0.9% FLUSH
10.0000 mL | Freq: Once | INTRAVENOUS | Status: DC
  Filled 2019-03-23: qty 10

## 2019-03-23 MED ORDER — SODIUM CHLORIDE 0.9% FLUSH
10.0000 mL | Freq: Once | INTRAVENOUS | Status: AC
  Administered 2019-03-23: 10 mL
  Filled 2019-03-23: qty 10

## 2019-03-23 MED ORDER — TRASTUZUMAB CHEMO 150 MG IV SOLR
6.0000 mg/kg | Freq: Once | INTRAVENOUS | Status: AC
  Administered 2019-03-23: 525 mg via INTRAVENOUS
  Filled 2019-03-23: qty 25

## 2019-03-23 MED ORDER — DIPHENHYDRAMINE HCL 25 MG PO CAPS
ORAL_CAPSULE | ORAL | Status: AC
  Filled 2019-03-23: qty 1

## 2019-03-23 MED ORDER — DIPHENHYDRAMINE HCL 25 MG PO CAPS
25.0000 mg | ORAL_CAPSULE | Freq: Once | ORAL | Status: AC
  Administered 2019-03-23: 14:00:00 25 mg via ORAL

## 2019-03-23 MED ORDER — HEPARIN SOD (PORK) LOCK FLUSH 100 UNIT/ML IV SOLN
500.0000 [IU] | Freq: Once | INTRAVENOUS | Status: AC
  Administered 2019-03-23: 500 [IU]
  Filled 2019-03-23: qty 5

## 2019-03-23 MED ORDER — SODIUM CHLORIDE 0.9 % IV SOLN
Freq: Once | INTRAVENOUS | Status: AC
  Administered 2019-03-23: 14:00:00 via INTRAVENOUS
  Filled 2019-03-23: qty 250

## 2019-03-23 MED ORDER — ACETAMINOPHEN 325 MG PO TABS
ORAL_TABLET | ORAL | Status: AC
  Filled 2019-03-23: qty 2

## 2019-03-23 MED ORDER — HEPARIN SOD (PORK) LOCK FLUSH 10 UNIT/ML IV SOLN: 10.0000 [IU] | Freq: Once | INTRAVENOUS | Status: DC

## 2019-03-23 MED ORDER — SODIUM CHLORIDE 0.9% FLUSH
10.0000 mL | INTRAVENOUS | Status: DC | PRN
  Administered 2019-03-23: 10 mL
  Filled 2019-03-23: qty 10

## 2019-03-23 MED ORDER — ACETAMINOPHEN 325 MG PO TABS
650.0000 mg | ORAL_TABLET | Freq: Once | ORAL | Status: AC
  Administered 2019-03-23: 650 mg via ORAL

## 2019-03-23 NOTE — Progress Notes (Signed)
[place]  [phone] [phone]    ID: Debbie [name] DOB: [birth year]  MR#: [id number]  YEM#:[id number]  Patient Care Team: Lavada Mesi as PCP - General (Family Medicine) Mauro Kaufmann, RN as Oncology Nurse Navigator Rockwell Germany, RN as Oncology Nurse Navigator Alphonsa Overall, MD as Consulting Physician (General Surgery) Magrinat, Virgie Dad, MD as Consulting Physician (Oncology) Gery Pray, MD as Consulting Physician (Radiation Oncology) Burden, Lincoln Brigham, MD as Referring Physician (Ophthalmology) Larey Dresser, MD as Consulting Physician (Cardiology) OTHER MD: Earl Lagos, MD (Ophthalmology)   CHIEF COMPLAINT: HER-2 positive breast cancer  CURRENT TREATMENT: Neoadjuvant chemotherapy   INTERVAL HISTORY: Debbie [name] returns today for follow-up and treatment of her HER-2 positive breast cancer.   She completed neo-adjuvant chemotherapy 3 weeks ago.  She is continuing on maintenance trastuzumab.  Since her last visit with [place], she underwent bilateral breast MRI on 03/04/2019 to evaluate her response to neoadjuvant chemotherapy.  She had a comple imaging resolution of her breast cancer, and there was no malignancy in either breast, and her right enlarged axillary lymph node is now normal in size.  Debbie [name] has lumpectomy and axillary node biopsy on 04/15/2019 with Dr. Lucia Gaskins.    Her most recent echocardiogram was on 03/10/2019 and shows an EF of 60-65%.     REVIEW OF SYSTEMS: Debbie [name] is doing well today.  At her last appointment, there was concern for a vaginal area cyst.  She says that this has improved and it is now smaller, non tender.  She notes she has had this for several years, and hasn't had a gynecologist since 2011 when she underwent hysterectomy.  Debbie [name] notes she is feeling well.  She is walking as she is able, and she denies any new issues today such as fever, chills, chest pain, palpitations, cough, shortness of  breath, unusual headaches, vision changes, nausea, vomiting, bowel/bladder changes or any other concerns.  A detailed ROS was otherwise non contributory.     HISTORY OF CURRENT ILLNESS: From the original intake note:  [place] had routine screening mammography on 10/21/2018 showing a possible abnormality in the right breast. She felt a lump in her breast, possibly dating back to 01/2018, but she didn't think anything about it due to her history with fibrous breasts; she has had fibrous breasts all her life and began to have mammograms at the age of 27.   She underwent right breast ultrasonography at [facility] on 10/28/2018 showing: On physical exam, a firm, fixed mass is palpated in the superior right breast. Targeted ultrasound is performed, showing an irregular hypoechoic mass at the 1 o'clock position 4 cm from the nipple. It measures 2.8 x 2.4 x 1.8 cm. There is associated peripheral and internal vascularity. An oval, circumscribed hypoechoic mass is also identified within the vicinity at the 1 o'clock position 4 cm from the nipple. It measures 1.0 x 0.9 x 0.4 cm. This corresponds with an additional mammographically identified mass in the upper inner quadrant at middle depth. This is mammographically stable dating back to at least 2014. Additional stable, circumscribed masses are scattered throughout the bilateral breasts mammographically. Evaluation of the right axilla demonstrates a single, markedly abnormal lymph node with cortical thickening up to 1 cm. No additional suspicious lymphadenopathy is identified.  Accordingly on 10/28/2018 she proceeded to biopsy of the right breast area in question. The pathology from this procedure showed ([id number]): invasive ductal carcinoma, nottingham grade II of III, 2.8  cm, 1 o'clock, 4.0 cm from the nipple. Prognostic indicators significant for: estrogen receptor, 90% positive, with strong staining intensity and progesterone receptor, 0%  negative. Proliferation marker Ki67 at 20%. HER2 Positive (3+) by immunohistochemistry.   An additional biopsy of the right axilla was performed on the same day ([id number]) showing:  2. Lymph node, needle/core biopsy, inferior right axilla - Tumor cells within fragmented nodal tissue  The patient's subsequent history is as detailed above.   PAST MEDICAL HISTORY: Past Medical History:  Diagnosis Date   Anxiety    Asthma    with severe URIs   Bronchitis    Essential hypertension, benign 12/01/2012   Generalized anxiety disorder 12/01/2012   Hypertension    Irritable bowel syndrome (IBS)    "had for years," per patient   PONV (postoperative nausea and vomiting)    Seasonal allergies    Sleep apnea      PAST SURGICAL HISTORY: Past Surgical History:  Procedure Laterality Date   ABDOMINAL HYSTERECTOMY  2011   with vaginal sling   ANKLE SURGERY     BREAST CYST EXCISION Bilateral    BREAST EXCISIONAL BIOPSY Bilateral    CHOLECYSTECTOMY  2012   KNEE ARTHROSCOPY W/ MEDIAL COLLATERAL LIGAMENT (MCL) REPAIR Left 10-07-2012   PORTACATH PLACEMENT N/A 11/16/2018   Procedure: INSERTION PORT-A-CATH WITH ULTRASOUND;  Surgeon: Alphonsa Overall, MD;  Location: [facility];  Service: General;  Laterality: N/A;   TONSILLECTOMY  1969     FAMILY HISTORY: Family History  Problem Relation Age of Onset   Hypertension Mother    Rheum arthritis Mother    Heart failure Mother    Hypertension Father    Alcoholism Father    Colon cancer Father    Lung cancer Father    Depression Father    Diabetes Father    Skin cancer Father    Hypertension Brother    Stroke Brother    Atrial fibrillation Brother    Hyperlipidemia Maternal Grandfather    Stroke Paternal Grandmother    Skin cancer Paternal Grandmother    Healthy Daughter    Healthy Daughter    Breast cancer Neg Hx    Ovarian cancer Neg Hx    Prostate cancer Neg Hx    Pancreatic cancer Neg Hx     Kyanna's father died from lung cancer at age 62. Patients' mother died from congestive heart failure at age 45. The patient has 1 brother. Patient denies anyone in her family having breast, ovarian, prostate, or pancreatic cancer. Debbie [name]'s father was diagnosed with skin cancer, colon cancer, and lung cancer. Debbie [name]'s paternal grandmother was diagnosed with skin cancer.    GYNECOLOGIC HISTORY:  No LMP recorded. Patient has had a hysterectomy. 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 Her husband, Debbie [name], is in [employer]	[place]. They have two daughters, Debbie [name] and Debbie [name]. [id number] Debbie [name] is 52, lives in [place], [place], and works in Building surveyor. Debbie [name] [place] is 80, lives in [place], [place], and is currently in school; he currently works [name] time as a Publishing copy.   ADVANCED DIRECTIVES: In the absence of any documentation, Nasira's spouse, Debbie [name], is her healthcare power of attorney.      HEALTH MAINTENANCE: Social History   Tobacco Use   Smoking status:  Never Smoker   Smokeless tobacco: Never Used  Substance Use Topics   Alcohol use: Yes    Comment: 2 q mth   Drug use: No    Colonoscopy: yes, 2015, [facility]   PAP: 2011  Bone density: no Mammography: 10/21/2018  Allergies  Allergen Reactions   Azithromycin Diarrhea   Erythromycin Itching   Erythromycin Base Itching   Levofloxacin     Tendon pain   Penicillin G Rash, Swelling and Hives   Penicillins Swelling and Rash    Did it involve swelling of the face/tongue/throat, SOB, or low BP? Yes Did it involve sudden or severe rash/hives, skin peeling, or any reaction on the inside of your mouth or nose? No Did you need to seek medical attention at  a hospital or doctor's office? Yes When did it last happen?40+ years ago If all above answers are NO, may proceed with cephalosporin use.     Current Outpatient Medications  Medication Sig Dispense Refill   AMBULATORY NON FORMULARY MEDICATION Continuous positive airway pressure (CPAP) machine set at autopap, with all supplemental supplies as needed. 1 each 0   calcium-vitamin D (OSCAL WITH D) 500-200 MG-UNIT tablet Take 1 tablet by mouth daily with breakfast.     ciprofloxacin (CIPRO) 500 MG tablet Take one tablet twice daily starting day 7 of each chemotherapy cycle; take for 5 days 50 tablet 0   fexofenadine (ALLEGRA) 180 MG tablet Take 180 mg by mouth daily as needed for allergies or rhinitis.     fluconazole (DIFLUCAN) 100 MG tablet Take one tablet by mouth every morning starting the day of chemo and continue for 4 days 30 tablet 0   hydrochlorothiazide (HYDRODIURIL) 12.5 MG tablet TAKE 1 TABLET DAILY 90 tablet 0   ketoconazole (NIZORAL) 2 % cream Apply to rash in pelvic area daily until cleared 15 g 0   metroNIDAZOLE (METROGEL) 1 % gel Apply to facial rash daily until cleared 45 g 0   montelukast (SINGULAIR) 10 MG tablet TAKE 1 TABLET AT BEDTIME 90 tablet 3   Multiple Vitamin (MULTIVITAMIN WITH MINERALS) TABS tablet Take 1 tablet by mouth daily.     pantoprazole (PROTONIX) 40 MG tablet TAKE 1 TABLET DAILY (Patient taking differently: Take 40 mg by mouth daily. ) 90 tablet 4   potassium chloride (MICRO-K) 10 MEQ CR capsule Take 1 capsule (10 mEq total) by mouth daily. 90 capsule 1   PROAIR HFA 108 (90 Base) MCG/ACT inhaler USE 2 INHALATIONS EVERY 4 HOURS AS NEEDED FOR WHEEZING (Patient taking differently: Inhale 2 puffs into the lungs every 4 (four) hours as needed for wheezing or shortness of breath. ) 25.5 g 1   traMADol (ULTRAM) 50 MG tablet Take 1-2 tablets (50-100 mg total) by mouth every 6 (six) hours as needed. 14 tablet 1   valACYclovir (VALTREX) 1000 MG  tablet Take 1 tablet (1,000 mg total) by mouth 2 (two) times daily as needed (cold sore). TAKE 2 TABLETS EVERY 12 HOURS FOR ONE DAY, TAKE AT THE ONSET OF COLD SORE 21 tablet 1   venlafaxine XR (EFFEXOR-XR) 75 MG 24 hr capsule Take 1 capsule (75 mg total) by mouth daily with breakfast. 60 capsule 4   vitamin B-12 (CYANOCOBALAMIN) 1000 MCG tablet Take 1,000 mcg by mouth daily.     zolpidem (AMBIEN) 5 MG tablet Take 2.5 mg by mouth at bedtime as needed for sleep.     zolpidem (AMBIEN) 5 MG tablet Take 1 tablet (5 mg total) by mouth  at bedtime as needed for sleep. 20 tablet 0   Liraglutide -Weight Management (SAXENDA) 18 MG/3ML SOPN Inject 3 mg into the skin daily. Please include ultra fine needles 29m 15 pen 1   metroNIDAZOLE (FLAGYL) 500 MG tablet Take 1 tablet (500 mg total) by mouth 3 (three) times daily. For 7 days. 21 tablet 0   Probiotic Product (VSL#3 PO) Take 1 tablet by mouth daily.     rifaximin (XIFAXAN) 550 MG TABS tablet Take 1 tablet (550 mg total) by mouth 3 (three) times daily. For 14 days. 42 tablet 0   No current facility-administered medications for this visit.      OBJECTIVE:  Vitals:   03/23/19 1300  BP: 132/70  Pulse: 74  Resp: 18  Temp: 99.1 F (37.3 C)  SpO2: 99%     Body mass index is 30.67 kg/m.   Wt Readings from Last 3 Encounters:  03/23/19 190 lb (86.2 kg)  03/02/19 190 lb 6.4 oz (86.4 kg)  02/09/19 189 lb 11.2 oz (86 kg)      ECOG FS:1 - Symptomatic but completely ambulatory GENERAL: Patient is a well appearing female in no acute distress HEENT:  Sclerae anicteric.  Oropharynx clear and moist. No ulcerations or evidence of oropharyngeal candidiasis. Neck is supple.  NODES:  No cervical, supraclavicular, or axillary lymphadenopathy palpated.  BREAST EXAM:  Deferred. LUNGS:  Clear to auscultation bilaterally.  No wheezes or rhonchi. HEART:  Regular rate and rhythm. No murmur appreciated. ABDOMEN:  Soft, nontender.  Positive, normoactive bowel  sounds. No organomegaly palpated. MSK:  No focal spinal tenderness to palpation. Full range of motion bilaterally in the upper extremities. EXTREMITIES:  No peripheral edema.   SKIN:  Clear with no obvious rashes or skin changes. No nail dyscrasia. NEURO:  Nonfocal. Well oriented.  Appropriate affect. GYN: declines exam as area isn't bothering her    LAB RESULTS:  CMP     Component Value Date/Time   NA 143 03/23/2019 1152   K 3.3 (L) 03/23/2019 1152   CL 108 03/23/2019 1152   CO2 27 03/23/2019 1152   GLUCOSE 90 03/23/2019 1152   BUN 10 03/23/2019 1152   CREATININE 0.65 03/23/2019 1152   CREATININE 0.71 01/20/2019 0752   CREATININE 0.66 07/08/2017 1127   CALCIUM 9.4 03/23/2019 1152   PROT 5.9 (L) 03/23/2019 1152   ALBUMIN 3.9 03/23/2019 1152   AST 27 03/23/2019 1152   AST 42 (H) 01/20/2019 0752   ALT 22 03/23/2019 1152   ALT 42 01/20/2019 0752   ALKPHOS 105 03/23/2019 1152   BILITOT 0.7 03/23/2019 1152   BILITOT 0.5 01/20/2019 0752   GFRNONAA >60 03/23/2019 1152   GFRNONAA >60 01/20/2019 0752   GFRNONAA 98 07/08/2017 1127   GFRAA >60 03/23/2019 1152   GFRAA >60 01/20/2019 0752   GFRAA 114 07/08/2017 1127    No results found for: TOTALPROTELP, ALBUMINELP, A1GS, A2GS, BETS, BETA2SER, GAMS, MSPIKE, SPEI  No results found for: KPAFRELGTCHN, LAMBDASER, KAPLAMBRATIO  Lab Results  Component Value Date   WBC 2.8 (L) 03/23/2019   NEUTROABS 1.3 (L) 03/23/2019   HGB 10.4 (L) 03/23/2019   HCT 30.9 (L) 03/23/2019   MCV 97.8 03/23/2019   PLT 153 03/23/2019    '@LASTCHEMISTRY' @  No results found for: LABCA2  No components found for: [id number] No results for input(s): INR in the last 168 hours.  No results found for: LABCA2  No results found for: CVUY233 No results found for: CIDH686 No  results found for: HLK562  No results found for: CA2729  No components found for: HGQUANT  No results found for: CEA1 / No results found for: CEA1   No results found for:  AFPTUMOR  No results found for: [place]  No results found for: PSA1  Appointment on 03/23/2019  Component Date Value Ref Range Status   Sodium 03/23/2019 143  135 - 145 mmol/L Final   Potassium 03/23/2019 3.3* 3.5 - 5.1 mmol/L Final   Chloride 03/23/2019 108  98 - 111 mmol/L Final   CO2 03/23/2019 27  22 - 32 mmol/L Final   Glucose, Bld 03/23/2019 90  70 - 99 mg/dL Final   BUN 03/23/2019 10  6 - 20 mg/dL Final   Creatinine, [name] 03/23/2019 0.65  0.44 - 1.00 mg/dL Final   Calcium 03/23/2019 9.4  8.9 - 10.3 mg/dL Final   Total Protein 03/23/2019 5.9* 6.5 - 8.1 g/dL Final   Albumin 03/23/2019 3.9  3.5 - 5.0 g/dL Final   AST 03/23/2019 27  15 - 41 U/L Final   ALT 03/23/2019 22  0 - 44 U/L Final   Alkaline Phosphatase 03/23/2019 105  38 - 126 U/L Final   Total Bilirubin 03/23/2019 0.7  0.3 - 1.2 mg/dL Final   GFR calc non Af Amer 03/23/2019 >60  >60 mL/min Final   GFR calc Af Amer 03/23/2019 >60  >60 mL/min Final   Anion gap 03/23/2019 8  5 - 15 Final   Performed at [facility], [place] [address], [place], [place] [address]   WBC 03/23/2019 2.8* 4.0 - 10.5 K/uL Final   RBC 03/23/2019 3.16* 3.87 - 5.11 MIL/uL Final   Hemoglobin 03/23/2019 10.4* 12.0 - 15.0 g/dL Final   HCT 03/23/2019 30.9* 36.0 - 46.0 % Final   MCV 03/23/2019 97.8  80.0 - 100.0 fL Final   MCH 03/23/2019 32.9  26.0 - 34.0 pg Final   MCHC 03/23/2019 33.7  30.0 - 36.0 g/dL Final   RDW 03/23/2019 15.7* 11.5 - 15.5 % Final   Platelets 03/23/2019 153  150 - 400 K/uL Final   nRBC 03/23/2019 0.0  0.0 - 0.2 % Final   Neutrophils Relative % 03/23/2019 47  % Final   Neutro Abs 03/23/2019 1.3* 1.7 - 7.7 K/uL Final   Lymphocytes Relative 03/23/2019 35  % Final   Lymphs Abs 03/23/2019 1.0  0.7 - 4.0 K/uL Final   Monocytes Relative 03/23/2019 15  % Final   Monocytes Absolute 03/23/2019 0.4  0.1 - 1.0 K/uL Final   Eosinophils Relative 03/23/2019 1  % Final   Eosinophils  Absolute 03/23/2019 0.0  0.0 - 0.5 K/uL Final   Basophils Relative 03/23/2019 1  % Final   Basophils Absolute 03/23/2019 0.0  0.0 - 0.1 K/uL Final   Immature Granulocytes 03/23/2019 1  % Final   Abs Immature Granulocytes 03/23/2019 0.02  0.00 - 0.07 K/uL Final   Performed at [facility], [place] [address] [place], [place] [address]    (this displays the last labs from the last 3 days)  No results found for: TOTALPROTELP, ALBUMINELP, A1GS, A2GS, BETS, BETA2SER, GAMS, MSPIKE, SPEI (this displays SPEP labs)  No results found for: KPAFRELGTCHN, LAMBDASER, KAPLAMBRATIO (kappa/lambda light chains)  No results found for: HGBA, HGBA2QUANT, HGBFQUANT, HGBSQUAN (Hemoglobinopathy evaluation)   No results found for: LDH  Lab Results  Component Value Date   IRON 46 05/11/2013   TIBC 254 05/11/2013   IRONPCTSAT 18 (L) 05/11/2013   (  Iron and TIBC)  Lab Results  Component Value Date   FERRITIN 72 05/11/2013    Urinalysis    Component Value Date/Time   BILIRUBINUR Small 11/09/2018 0914   PROTEINUR Negative 11/09/2018 0914   UROBILINOGEN 0.2 11/09/2018 0914   NITRITE Negative 11/09/2018 0914   LEUKOCYTESUR Negative 11/09/2018 0914     STUDIES:  Mr Breast Bilateral W Wo Contrast Inc Cad  Result Date: 03/04/2019 CLINICAL DATA:  Breast cancer, restaging, on neoadjuvant chemotherapy, preoperative exam. LABS:  GFR greater than 60 EXAM: BILATERAL BREAST MRI WITH AND WITHOUT CONTRAST TECHNIQUE: Multiplanar, multisequence MR images of both breasts were obtained prior to and following the intravenous administration of 8 ml of Gadavist Three-dimensional MR images were rendered by post-processing of the original MR data on an independent workstation. The three-dimensional MR images were interpreted, and findings are reported in the following complete MRI report for this study. Three dimensional images were evaluated at the independent DynaCad workstation COMPARISON:   Previous exams including breast MRI dated 11/09/2018. FINDINGS: Breast composition: c. Heterogeneous fibroglandular tissue. Background parenchymal enhancement: Minimal Right breast: Biopsy clip artifact within the upper inner quadrant of the RIGHT breast, corresponding to the site of patient's biopsy-proven carcinoma. The 2.8 cm enhancing mass associated with this biopsy clip on the previous MRI of 11/09/2018 is no longer seen, indicating complete imaging resolution. There is now no suspicious enhancing mass, non-mass enhancement or secondary signs of malignancy in the RIGHT breast. Left breast: No suspicious enhancing mass, non-mass enhancement or secondary signs of malignancy within the LEFT breast. Lymph nodes: There are no enlarged or morphologically abnormal lymph nodes identified within either axillary region or within the internal mammary chain regions bilaterally. The single enlarged lymph node within the RIGHT axilla on the previous MRI is now normal in size. Ancillary findings:  None. IMPRESSION: 1. Complete imaging resolution status post neoadjuvant chemotherapy 2. There is now no MRI evidence of malignancy within either breast. 3. Biopsy clip within the upper inner quadrant of the RIGHT breast, corresponding to patient's known biopsy-proven carcinoma. 4. There are no enlarged lymph nodes seen on today's exam. Single enlarged lymph node within the RIGHT axilla on the previous study (biopsy-proven axillary node metastasis) is now normal in size. RECOMMENDATION: Per current treatment plan for patient's known RIGHT breast malignancy. BI-RADS CATEGORY  6: Known biopsy-proven malignancy. However, there has been complete imaging resolution since previous MRI of 11/09/2018. Electronically Signed   By: Debbie [name] M.D.   On: 03/04/2019 12:49     ELIGIBLE FOR AVAILABLE RESEARCH PROTOCOL: no   ASSESSMENT: 58 y.o. Debbie [name], Debbie [name] status post right breast upper inner quadrant biopsy 10/28/2018 for a  clinical T2 N1, stage IIA invasive ductal carcinoma, grade 2, estrogen receptor positive, progesterone receptor negative, HER-2 amplified, with an MIB-1 of 20%  (a) staging work-up with CT scans of the chest abdomen and pelvis and thyroid ultrasonography shows multiple findings that will require follow-up:   (i) two 0.5 cm liver lesions   (ii) possible bladder mass, thickened sigmoid and rectum, and prominent adnexa   (iii) thyroid nodule biopsied 11/19/2018, read as [facility] 3    (iiii)  thyroid lymph node biopsied 11/19/2018 showing no evidence of carcinoma  (1) neoadjuvant chemotherapy will consist of carboplatin, docetaxel, trastuzumab, and Pertuzumab every 21 days x 6 starting 11/17/2018  (a) pertuzumab discontinued after cycle 1 due to severe diarrhea  (2) trastuzumab will be continued to complete 1 year  (a) baseline echocardiogram 11/11/2018 showed an ejection fraction  in the 55-60% range  (b) echocardiogram 03/10/2019 shows EF of 60-65%  (3) definitive surgery to follow  (4) adjuvant radiation as appropriate  (5) antiestrogens at the completion of local treatment  (6) referral thyroid surgeon at the completion of adjuvant radiation  (7) very small liver abnormalities will require follow-up but are almost certainly benign  (8) viral conjunctivitis diagnosed 01/14/2019   PLAN: Debbie [name] is doing well today.  She is continuing on maintenance Trastuzumab for one year of therapy.  Her labs are stable and I reviewed these with her in detail.  She will undergo surgery on 8/27.   Debbie [name] and I reviewed her MRI results and the images on the computer that are compatible with a complete imaging response.  I reviewed with her that this is great news.  I also reviewed with her that she will complete Trastuzumab in 10/2018.    I ordered her next echocardiogram due in October, 2020 and also requested referral with Dr. Haroldine [name] around that time.  I reviewed with her the role of Dr. Haroldine [name] in her  heart care and prevention and monitoring of Trastuzumab related cardiotoxicity.    I recommended she f/u with GYN about her vaginal area lesion.  I encouraged her to go ahead and call and get an appointment as may gynecologists it takes a couple of months to get in with.    Debbie [name] will return in 3 weeks for labs and Trastuzumab, and in 6 weeks for labs, f/u and Trastuzumab.  She was recommended to continue with the appropriate pandemic precautions. Debbie [name] knows to call for any other issue that may develop before the next visit.  A total of (20) minutes of face-to-face time was spent with this patient with greater than 50% of that time in counseling and care-coordination.    Wilber Bihari, NP  03/23/19 1:29 PM Medical Oncology and Hematology [facility] [address] [place], [place] [address] Tel. [phone]    Fax. [phone]

## 2019-03-23 NOTE — Telephone Encounter (Signed)
TC to Pt. To inform her of low potassium level Pt stated she takes potassium but takes it maybe 5 out of 7 days told Pt. She can also take potassium enriched foods as well. Pt. Verbalized understanding.

## 2019-03-23 NOTE — Telephone Encounter (Signed)
-----   Message from Gardenia Phlegm, NP sent at 03/23/2019  3:42 PM EDT ----- Please call and see if patient is taking potassium.  Needs to increase potassium content in diet.   ----- Message ----- From: Interface, Lab In [place] Sent: 03/23/2019  12:20 PM EDT To: Chauncey Cruel, MD

## 2019-03-23 NOTE — Patient Instructions (Signed)
Please take your potassium!!!  [facility] Discharge Instructions for Patients Receiving Chemotherapy  Today you received the following chemotherapy agents Herceptin.  To help prevent nausea and vomiting after your treatment, we encourage you to take your nausea medication as directed.  If you develop nausea and vomiting that is not controlled by your nausea medication, call the clinic.   BELOW ARE SYMPTOMS THAT SHOULD BE REPORTED IMMEDIATELY:  *FEVER GREATER THAN 100.5 F  *CHILLS WITH OR WITHOUT FEVER  NAUSEA AND VOMITING THAT IS NOT CONTROLLED WITH YOUR NAUSEA MEDICATION  *UNUSUAL SHORTNESS OF BREATH  *UNUSUAL BRUISING OR BLEEDING  TENDERNESS IN MOUTH AND THROAT WITH OR WITHOUT PRESENCE OF ULCERS  *URINARY PROBLEMS  *BOWEL PROBLEMS  UNUSUAL RASH Items with * indicate a potential emergency and should be followed up as soon as possible.  Feel free to call the clinic should you have any questions or concerns. The clinic phone number is [phone].  Please show the [facility] at check-in to the Emergency Department and triage nurse.

## 2019-03-24 ENCOUNTER — Encounter: Payer: Self-pay | Admitting: *Deleted

## 2019-04-09 ENCOUNTER — Encounter (HOSPITAL_BASED_OUTPATIENT_CLINIC_OR_DEPARTMENT_OTHER): Payer: Self-pay | Admitting: *Deleted

## 2019-04-09 ENCOUNTER — Other Ambulatory Visit: Payer: Self-pay

## 2019-04-12 ENCOUNTER — Other Ambulatory Visit (HOSPITAL_COMMUNITY)
Admission: RE | Admit: 2019-04-12 | Discharge: 2019-04-12 | Disposition: A | Discharge status: Home / Self Care | Payer: BC Managed Care – PPO | Source: Ambulatory Visit | Attending: Surgery | Admitting: Surgery

## 2019-04-12 ENCOUNTER — Telehealth: Payer: Self-pay | Admitting: *Deleted

## 2019-04-12 DIAGNOSIS — C50911 Malignant neoplasm of unspecified site of right female breast: Secondary | ICD-10-CM | POA: Diagnosis not present

## 2019-04-12 DIAGNOSIS — Z01812 Encounter for preprocedural laboratory examination: Secondary | ICD-10-CM | POA: Insufficient documentation

## 2019-04-12 DIAGNOSIS — Z20828 Contact with and (suspected) exposure to other viral communicable diseases: Secondary | ICD-10-CM | POA: Diagnosis not present

## 2019-04-12 LAB — SARS CORONAVIRUS 2 (TAT 6-24 HRS): SARS Coronavirus 2: NEGATIVE

## 2019-04-12 NOTE — Telephone Encounter (Signed)
This RN was informed pt was sent for Covid testing today per pending surgery on 04/14/2019 - results are pending and not expected until late 8/25.  Pt is scheduled to come into this office on 8/25 - per protocol per pending Covid result - appointment tomorrow will be canceled.  Pt is scheduled for lab visit and treatment on 9/15.  Above discussed and plan verified with pt.

## 2019-04-13 ENCOUNTER — Ambulatory Visit: Payer: BC Managed Care – PPO

## 2019-04-13 ENCOUNTER — Other Ambulatory Visit: Payer: BC Managed Care – PPO

## 2019-04-14 ENCOUNTER — Other Ambulatory Visit: Payer: Self-pay

## 2019-04-14 ENCOUNTER — Ambulatory Visit
Admission: RE | Admit: 2019-04-14 | Discharge: 2019-04-14 | Disposition: A | Discharge status: Home / Self Care | Payer: BC Managed Care – PPO | Source: Ambulatory Visit | Attending: Surgery | Admitting: Surgery

## 2019-04-14 DIAGNOSIS — C50911 Malignant neoplasm of unspecified site of right female breast: Secondary | ICD-10-CM

## 2019-04-14 DIAGNOSIS — C773 Secondary and unspecified malignant neoplasm of axilla and upper limb lymph nodes: Secondary | ICD-10-CM | POA: Diagnosis not present

## 2019-04-14 NOTE — Progress Notes (Signed)
   Drink at 0900 DOS. All questions and concerns addressed.         Enhanced Recovery after Surgery for Orthopedics Enhanced Recovery after Surgery is a protocol used to improve the stress on your body and your recovery after surgery.  Patient Instructions  . The night before surgery:  o No food after midnight. ONLY clear liquids after midnight  . The day of surgery (if you do NOT have diabetes):  o Drink ONE (1) Pre-Surgery Clear Ensure as directed.   o This drink was given to you during your hospital  pre-op appointment visit. o The pre-op nurse will instruct you on the time to drink the  Pre-Surgery Ensure depending on your surgery time. o Finish the drink at the designated time by the pre-op nurse.  o Nothing else to drink after completing the  Pre-Surgery Clear Ensure.  . The day of surgery (if you have diabetes): o Drink ONE (1) Gatorade 2 (G2) as directed. o This drink was given to you during your hospital  pre-op appointment visit.  o The pre-op nurse will instruct you on the time to drink the   Gatorade 2 (G2) depending on your surgery time. o Color of the Gatorade may vary. Red is not allowed. o Nothing else to drink after completing the  Gatorade 2 (G2).       Drink at 0900 DOS. All questions and concerns addressed.     If you have questions, please contact your surgeon's office. 2% CHG Cloths CHG  Bathing Process . If using a cloth warmer, warm cloths before use. Marland Kitchen Cloths may be used without being warmed. . Use a clean CHG cloth for each area of the body to reduce the chance of spreading germs from one area to another. . Do not use above jawline (should not come in contact to eyes or ears). Wendee Copp face and head first with warm cloth before starting with CHG. o Use shampoo cap or directly use shampoo sparingly, avoid contact with rest of the body, as it may deactivate CHG. . Use all six cloths in the following order:  1. Cloth 1: Neck, shoulders, and chest.  2. Cloth 2: Both arms, both hands, web spaces, and axilla. 3. Cloth 3: Abdomen and then groin/perineum. 4. Cloth 4: Right leg, right foot, and web spaces. 5. Cloth 5: Left leg, left foot, and web spaces. 6. Cloth 6: Back of neck, back, and then buttocks.  . After application to each body site, be sure to clean tubing from Foleys, drains, G-tube/J-tubes, rectal tubes, chest tubes within 6 inches of the patient.    CHG Bathing Process - Key Points . Firmly massage skin with CHG cloth.  o Skin may feel sticky for a few minutes. o Let air dry . CHG can replace bathing:  o Do NOT bathe with soap and water after using the CHG wipes.  If you chose to do a soap and water bath, it must be done first, allowed to dry, then use CHG wipes.  . If additional moisturizer is needed, use only CHG-compatible products. . Certain lotions will inactivate CHG, ensure to check with manufacturer for compatibility. . Dispose of leftover cloths - Do NOT flush wipes.

## 2019-04-14 NOTE — H&P (Signed)
[place]  Location: [facility] Patient #: [id number] DOB: Oct 17, 1960 Married / Language: English / Race: White Female  History of Present Illness   The patient is a 58 year old female who presents with a complaint of breast cancer.  The PCP is Iran Planas, [place]  The pateint was seen at the [facility] - Oncology is Drs. Magrinat and Kinard  She comes by herself. [The Covid-19 virus has disrupted normal medical care in [place] and across the nation. We have sometimes had to alter normal surgical/medical care to limit this epidemic and we have explained these changes to the patient.]  She had a breast MRI on 03/04/2019 which shows no residual disease. She was presented at the breast cancer conference this morning, and felt to be a candidate for right breast lumpectomy with targeted right axillary node dissection. She has done well with the chemotherapy. Her husband is at work and she does not want to get him on the phone. I gave her a booklet on breast cancer surgery and outlined our planned surgery. She just completed her chemotherapy last week, so we'll wait a couple weeks before scheduling her surgery.  History of breast cancer She has had fibrocystic problems with her breast since age 81. She has undergone biopsies of both breast in the past. She had 2 lumpectomies of the left breast and one lumpectomy of the right breast. These were in [place]. Most recently, she had a core biopsy in 2010 in [place]. She is unsure which breast this was. All past breast biopsies have been benign. She felt a mass in her right breast about 2 or 3 months ago. She felt this was something similar to her prior experience. Her last mammogram was about a year and a half ago.  Mammograms: [facility] - 10/28/2018 - 2.8 cm x 2.4 cm mass at 1 o'clock, single abnormal right axillary node Biopsy: Right breast biopsy, 1 o'clock on  10/28/2018 ([id number]) - IDC, grade 2-3, ER - 90%, PR - 0%, Ki67 - 20%, Her2Neu - POSITIVE, right axillary node - POSITIVE Family history of breast or ovarian cancer: On hormone therapy:  I discussed the options for breast cancer treatment with the patient. The patient is at the [facility], which includes medical oncology and radiation oncology. I discussed the surgical options of lumpectomy vs. mastectomy. If mastectomy, there is the possibility of reconstruction. I discussed the options of lymph node biopsy. The treatment plan depends on the pathologic staging of the tumor and the patient's personal wishes. The risks of surgery include, but are not limited to, bleeding, infection, the need for further surgery, and nerve injury. The patient has been given literature on the treatment of breast cancer.  Plan: 1) She is a candidate for right breast lumpectomy (seed localization) and targeted right axillary node dissection (seed localization)  Past Medical History: 1. Right breast [facility] - 10/28/2018 - 2.8 cm x 2.4 cm mass at 1 o'clock, single abnormal right axillary node Right breast biopsy, 1 o'clock on 10/28/2018 ([id number]) - IDC, grade 2-3, ER - 90%, PR - 0%, Ki67 - 20%, Her2Neu - POSITIVE, right axillary node - POSITIVE She had neoadjuvant with a great response by MRI 2. Thyroid nodule 3. HTN 4. Asthma -seasonal 5. She went to an ER in [place] in Dec 2019 for "chest pain". But this all proved to be negative. 6. cholecystectomy - 2012 7. Hysterectomy - 2011 - but did not  remove ovaries.  Social History: Married her husband - [name], and daughter [name].  Works as Engineer, building services for [employer] (April Staton, [place]; 03/10/2019 12:01 PM) Azithromycin *CHEMICALS*  Erythromycin *MACROLIDES*  Itching. levoFLOXacin *FLUOROQUINOLONES*  Tendon Pain Penicillins  Rash,  Swelling.  Medication History (April Staton, CMA; 03/10/2019 12:01 PM) hydroCHLOROthiazide (12.5MG Tablet, Oral) Active. Pantoprazole Sodium (40MG Tablet DR, Oral) Active. ProAir HFA (108 (90 Base)MCG/ACT Aerosol Soln, Inhalation) Active. Xifaxan (550MG Tablet, Oral) Active. Saxenda (18MG/3ML Soln Pen-inj, Subcutaneous) Active. Medications Reconciled  Vitals (April Staton CMA; 03/10/2019 12:02 PM) 03/10/2019 12:01 PM Weight: 184.38 lb Height: 66in Body Surface Area: 1.93 m Body Mass Index: 29.76 kg/m  Temp.: 98.74F (Oral)  Pulse: 96 (Regular)    Physical Exam  General: WN WF who is alert and generally healthy appearing. She has no hair. She is wearing a mask. Skin: Inspection and palpation of the skin unremarkable.  Eyes: Conjunctivae white, pupils equal. Face, ears, nose, mouth, and throat: Face - normal. Normal ears and nose. Lips and teeth normal.  Neck: Supple. No mass. Trachea midline. No thyroid mass.  Lymph Nodes: No supraclavicular or cervical adenopathy. I do not feel a right axillary mass.  Lungs: Normal respiratory effort. Clear to auscultation and symmetric breath sounds. Cardiovascular: Regular rate and rythm. Normal auscultation of the heart. No murmur or rub.  Breasts: Right - Large, Scar at 9 o'clock I do not feel any tumor in the UIQ of right breast Left - Large, Scar at 9 o'clock and periareolar Port in upper left chest.  Abdomen: Soft. No mass. Liver and spleen not palpable. No tenderness. No hernia. Normal bowel sounds. No abdominal scars. Rectal: Not done.  Musculoskeletal/extremities: Normal gait. Good strength and ROM in upper and lower extremities.    Assessment & Plan  1.  BREAST CANCER, STAGE 2, RIGHT (C50.911)  Story: Biopsy: Right breast biopsy, 1 o'clock on 10/28/2018 ([id number]) - IDC, grade 2-3, ER - 90%, PR - 0%, Ki67 - 20%, Her2Neu - POSITIVE, right axillary node - POSITIVE  Oncology -  Magrinat and Kinard  Plan:   1. Right breast lumpectomy (seed localization) and targeted right axillary node dissection (seed localization)  2.  THYROID NODULE (E04.1) 3. HTN 4. Asthma -seasonal  .Alphonsa Overall, MD, [facility] Pager: [phone] Office phone:  [phone]

## 2019-04-15 ENCOUNTER — Ambulatory Visit
Admission: RE | Admit: 2019-04-15 | Discharge: 2019-04-15 | Disposition: A | Discharge status: Home / Self Care | Payer: BC Managed Care – PPO | Source: Ambulatory Visit | Attending: Surgery | Admitting: Surgery

## 2019-04-15 ENCOUNTER — Ambulatory Visit (HOSPITAL_BASED_OUTPATIENT_CLINIC_OR_DEPARTMENT_OTHER)
Admission: RE | Admit: 2019-04-15 | Discharge: 2019-04-15 | Disposition: A | Discharge status: Home / Self Care | Payer: BC Managed Care – PPO | Attending: Surgery | Admitting: Surgery

## 2019-04-15 ENCOUNTER — Encounter (HOSPITAL_BASED_OUTPATIENT_CLINIC_OR_DEPARTMENT_OTHER)
Admission: RE | Disposition: A | Discharge status: Home / Self Care | Payer: Self-pay | Source: Home / Self Care | Attending: Surgery

## 2019-04-15 ENCOUNTER — Ambulatory Visit (HOSPITAL_BASED_OUTPATIENT_CLINIC_OR_DEPARTMENT_OTHER): Payer: BC Managed Care – PPO | Admitting: Certified Registered"

## 2019-04-15 ENCOUNTER — Other Ambulatory Visit: Payer: Self-pay

## 2019-04-15 ENCOUNTER — Ambulatory Visit (HOSPITAL_COMMUNITY)
Admission: RE | Admit: 2019-04-15 | Discharge: 2019-04-15 | Disposition: A | Discharge status: Home / Self Care | Payer: BC Managed Care – PPO | Source: Home / Self Care | Attending: Surgery | Admitting: Surgery

## 2019-04-15 ENCOUNTER — Encounter (HOSPITAL_BASED_OUTPATIENT_CLINIC_OR_DEPARTMENT_OTHER): Payer: Self-pay | Admitting: Anesthesiology

## 2019-04-15 DIAGNOSIS — J45998 Other asthma: Secondary | ICD-10-CM | POA: Diagnosis not present

## 2019-04-15 DIAGNOSIS — N6011 Diffuse cystic mastopathy of right breast: Secondary | ICD-10-CM | POA: Insufficient documentation

## 2019-04-15 DIAGNOSIS — G4730 Sleep apnea, unspecified: Secondary | ICD-10-CM | POA: Insufficient documentation

## 2019-04-15 DIAGNOSIS — Z170 Estrogen receptor positive status [ER+]: Secondary | ICD-10-CM | POA: Diagnosis not present

## 2019-04-15 DIAGNOSIS — C50911 Malignant neoplasm of unspecified site of right female breast: Secondary | ICD-10-CM

## 2019-04-15 DIAGNOSIS — Z881 Allergy status to other antibiotic agents status: Secondary | ICD-10-CM | POA: Diagnosis not present

## 2019-04-15 DIAGNOSIS — M1990 Unspecified osteoarthritis, unspecified site: Secondary | ICD-10-CM | POA: Insufficient documentation

## 2019-04-15 DIAGNOSIS — I10 Essential (primary) hypertension: Secondary | ICD-10-CM | POA: Diagnosis not present

## 2019-04-15 DIAGNOSIS — K219 Gastro-esophageal reflux disease without esophagitis: Secondary | ICD-10-CM | POA: Diagnosis not present

## 2019-04-15 DIAGNOSIS — Z880 Allergy status to penicillin: Secondary | ICD-10-CM | POA: Diagnosis not present

## 2019-04-15 DIAGNOSIS — G8918 Other acute postprocedural pain: Secondary | ICD-10-CM | POA: Diagnosis not present

## 2019-04-15 DIAGNOSIS — C773 Secondary and unspecified malignant neoplasm of axilla and upper limb lymph nodes: Secondary | ICD-10-CM | POA: Diagnosis not present

## 2019-04-15 DIAGNOSIS — N6031 Fibrosclerosis of right breast: Secondary | ICD-10-CM | POA: Diagnosis not present

## 2019-04-15 DIAGNOSIS — Z79899 Other long term (current) drug therapy: Secondary | ICD-10-CM | POA: Diagnosis not present

## 2019-04-15 DIAGNOSIS — C50211 Malignant neoplasm of upper-inner quadrant of right female breast: Secondary | ICD-10-CM | POA: Diagnosis not present

## 2019-04-15 HISTORY — DX: Gastro-esophageal reflux disease without esophagitis: K21.9

## 2019-04-15 HISTORY — PX: BREAST LUMPECTOMY WITH RADIOACTIVE SEED AND AXILLARY LYMPH NODE DISSECTION: SHX6656

## 2019-04-15 SURGERY — BREAST LUMPECTOMY WITH RADIOACTIVE SEED AND AXILLARY LYMPH NODE DISSECTION
Anesthesia: General | Site: Breast | Laterality: Right

## 2019-04-15 MED ORDER — PROPOFOL 10 MG/ML IV BOLUS
INTRAVENOUS | Status: AC
  Filled 2019-04-15: qty 40

## 2019-04-15 MED ORDER — MIDAZOLAM HCL 2 MG/2ML IJ SOLN
INTRAMUSCULAR | Status: AC
  Filled 2019-04-15: qty 2

## 2019-04-15 MED ORDER — PROPOFOL 10 MG/ML IV BOLUS
INTRAVENOUS | Status: DC | PRN
  Administered 2019-04-15: 150 mg via INTRAVENOUS

## 2019-04-15 MED ORDER — BUPIVACAINE-EPINEPHRINE (PF) 0.25% -1:200000 IJ SOLN
INTRAMUSCULAR | Status: AC
  Filled 2019-04-15: qty 30

## 2019-04-15 MED ORDER — FENTANYL CITRATE (PF) 100 MCG/2ML IJ SOLN
INTRAMUSCULAR | Status: AC
  Filled 2019-04-15: qty 2

## 2019-04-15 MED ORDER — OXYCODONE HCL 5 MG PO TABS
ORAL_TABLET | ORAL | Status: AC
  Filled 2019-04-15: qty 2

## 2019-04-15 MED ORDER — HYDROMORPHONE HCL 1 MG/ML IJ SOLN
0.5000 mg | INTRAMUSCULAR | Status: AC | PRN
  Administered 2019-04-15 (×4): 0.5 mg via INTRAVENOUS

## 2019-04-15 MED ORDER — OXYCODONE HCL 5 MG PO TABS
10.0000 mg | ORAL_TABLET | Freq: Once | ORAL | Status: AC
  Administered 2019-04-15: 10 mg via ORAL

## 2019-04-15 MED ORDER — SCOPOLAMINE 1 MG/3DAYS TD PT72
1.0000 | MEDICATED_PATCH | Freq: Once | TRANSDERMAL | Status: DC
  Administered 2019-04-15: 12:00:00 1.5 mg via TRANSDERMAL

## 2019-04-15 MED ORDER — HYDROMORPHONE HCL 1 MG/ML IJ SOLN
INTRAMUSCULAR | Status: AC
  Filled 2019-04-15: qty 1

## 2019-04-15 MED ORDER — PROPOFOL 500 MG/50ML IV EMUL
INTRAVENOUS | Status: DC | PRN
  Administered 2019-04-15: 25 ug/kg/min via INTRAVENOUS

## 2019-04-15 MED ORDER — BUPIVACAINE HCL (PF) 0.5 % IJ SOLN
INTRAMUSCULAR | Status: DC | PRN
  Administered 2019-04-15: 20 mL

## 2019-04-15 MED ORDER — BUPIVACAINE-EPINEPHRINE (PF) 0.5% -1:200000 IJ SOLN
INTRAMUSCULAR | Status: AC
  Filled 2019-04-15: qty 30

## 2019-04-15 MED ORDER — SODIUM CHLORIDE 0.9 % IV SOLN: INTRAVENOUS | Status: DC | PRN

## 2019-04-15 MED ORDER — HYDROMORPHONE HCL 1 MG/ML IJ SOLN
0.5000 mg | Freq: Once | INTRAMUSCULAR | Status: AC
  Administered 2019-04-15: 16:00:00 0.5 mg via INTRAVENOUS

## 2019-04-15 MED ORDER — EPHEDRINE SULFATE 50 MG/ML IJ SOLN
INTRAMUSCULAR | Status: DC | PRN
  Administered 2019-04-15 (×2): 5 mg via INTRAVENOUS

## 2019-04-15 MED ORDER — HYDROMORPHONE HCL 1 MG/ML IJ SOLN
INTRAMUSCULAR | Status: AC
  Filled 2019-04-15: qty 0.5

## 2019-04-15 MED ORDER — CELECOXIB 200 MG PO CAPS
200.0000 mg | ORAL_CAPSULE | ORAL | Status: AC
  Administered 2019-04-15: 12:00:00 200 mg via ORAL

## 2019-04-15 MED ORDER — LIDOCAINE 2% (20 MG/ML) 5 ML SYRINGE
INTRAMUSCULAR | Status: AC
  Filled 2019-04-15: qty 5

## 2019-04-15 MED ORDER — DEXAMETHASONE SODIUM PHOSPHATE 10 MG/ML IJ SOLN
INTRAMUSCULAR | Status: AC
  Filled 2019-04-15: qty 1

## 2019-04-15 MED ORDER — CHLORHEXIDINE GLUCONATE CLOTH 2 % EX PADS: 6.0000 | MEDICATED_PAD | Freq: Once | CUTANEOUS | Status: DC

## 2019-04-15 MED ORDER — LACTATED RINGERS IV SOLN
INTRAVENOUS | Status: DC
  Administered 2019-04-15 (×2): via INTRAVENOUS

## 2019-04-15 MED ORDER — SCOPOLAMINE 1 MG/3DAYS TD PT72
MEDICATED_PATCH | TRANSDERMAL | Status: AC
  Filled 2019-04-15: qty 1

## 2019-04-15 MED ORDER — CELECOXIB 200 MG PO CAPS
ORAL_CAPSULE | ORAL | Status: AC
  Filled 2019-04-15: qty 1

## 2019-04-15 MED ORDER — CEFAZOLIN SODIUM-DEXTROSE 2-4 GM/100ML-% IV SOLN
2.0000 g | INTRAVENOUS | Status: AC
  Administered 2019-04-15: 13:00:00 2 g via INTRAVENOUS

## 2019-04-15 MED ORDER — HYDROCODONE-ACETAMINOPHEN 5-325 MG PO TABS: 1.0000 | ORAL_TABLET | Freq: Four times a day (QID) | ORAL | 0 refills | Status: DC | PRN

## 2019-04-15 MED ORDER — ONDANSETRON HCL 4 MG/2ML IJ SOLN
INTRAMUSCULAR | Status: AC
  Filled 2019-04-15: qty 2

## 2019-04-15 MED ORDER — SODIUM CHLORIDE 0.9 % IV SOLN
INTRAVENOUS | Status: DC | PRN
  Administered 2019-04-15: 14:00:00 50 ug/min via INTRAVENOUS

## 2019-04-15 MED ORDER — SODIUM CHLORIDE (PF) 0.9 % IJ SOLN
INTRAMUSCULAR | Status: AC
  Filled 2019-04-15: qty 10

## 2019-04-15 MED ORDER — SODIUM CHLORIDE (PF) 0.9 % IJ SOLN
INTRAVENOUS | Status: DC | PRN
  Administered 2019-04-15: 13:00:00 1 mL

## 2019-04-15 MED ORDER — MIDAZOLAM HCL 2 MG/2ML IJ SOLN
1.0000 mg | INTRAMUSCULAR | Status: DC | PRN
  Administered 2019-04-15: 12:00:00 2 mg via INTRAVENOUS

## 2019-04-15 MED ORDER — FENTANYL CITRATE (PF) 100 MCG/2ML IJ SOLN
25.0000 ug | INTRAMUSCULAR | Status: DC | PRN
  Administered 2019-04-15 (×3): 50 ug via INTRAVENOUS

## 2019-04-15 MED ORDER — BUPIVACAINE LIPOSOME 1.3 % IJ SUSP
INTRAMUSCULAR | Status: DC | PRN
  Administered 2019-04-15: 10 mL

## 2019-04-15 MED ORDER — ACETAMINOPHEN 500 MG PO TABS
ORAL_TABLET | ORAL | Status: AC
  Filled 2019-04-15: qty 1

## 2019-04-15 MED ORDER — METHYLENE BLUE 0.5 % INJ SOLN
INTRAVENOUS | Status: AC
  Filled 2019-04-15: qty 10

## 2019-04-15 MED ORDER — CEFAZOLIN SODIUM-DEXTROSE 2-4 GM/100ML-% IV SOLN
INTRAVENOUS | Status: AC
  Filled 2019-04-15: qty 100

## 2019-04-15 MED ORDER — ONDANSETRON HCL 4 MG/2ML IJ SOLN
INTRAMUSCULAR | Status: DC | PRN
  Administered 2019-04-15: 4 mg via INTRAVENOUS

## 2019-04-15 MED ORDER — FENTANYL CITRATE (PF) 100 MCG/2ML IJ SOLN
50.0000 ug | INTRAMUSCULAR | Status: AC | PRN
  Administered 2019-04-15: 15:00:00 50 ug via INTRAVENOUS
  Administered 2019-04-15 (×2): 25 ug via INTRAVENOUS
  Administered 2019-04-15: 100 ug via INTRAVENOUS

## 2019-04-15 MED ORDER — TECHNETIUM TC 99M SULFUR COLLOID FILTERED
1.0000 | Freq: Once | INTRAVENOUS | Status: AC | PRN
  Administered 2019-04-15: 1 via INTRADERMAL

## 2019-04-15 MED ORDER — ACETAMINOPHEN 500 MG PO TABS
1000.0000 mg | ORAL_TABLET | ORAL | Status: AC
  Administered 2019-04-15: 1000 mg via ORAL

## 2019-04-15 SURGICAL SUPPLY — 60 items
ADH SKN CLS APL DERMABOND .7 (GAUZE/BANDAGES/DRESSINGS) ×1
APL PRP STRL LF DISP 70% ISPRP (MISCELLANEOUS) ×1
APL SKNCLS STERI-STRIP NONHPOA (GAUZE/BANDAGES/DRESSINGS) ×1
BENZOIN TINCTURE PRP APPL 2/3 (GAUZE/BANDAGES/DRESSINGS) ×3 IMPLANT
BINDER BREAST LRG (GAUZE/BANDAGES/DRESSINGS) ×1 IMPLANT
BINDER BREAST MEDIUM (GAUZE/BANDAGES/DRESSINGS) ×1 IMPLANT
BINDER BREAST XLRG (GAUZE/BANDAGES/DRESSINGS) ×3 IMPLANT
BINDER BREAST XXLRG (GAUZE/BANDAGES/DRESSINGS) ×1 IMPLANT
BLADE SURG 15 STRL LF DISP TIS (BLADE) ×1 IMPLANT
BLADE SURG 15 STRL SS (BLADE) ×3
CANISTER SUC SOCK COL 7IN (MISCELLANEOUS) ×1 IMPLANT
CANISTER SUCT 1200ML W/VALVE (MISCELLANEOUS) ×3 IMPLANT
CHLORAPREP W/TINT 26 (MISCELLANEOUS) ×3 IMPLANT
CLIP VESOCCLUDE SM WIDE 6/CT (CLIP) ×3 IMPLANT
CLOSURE WOUND 1/2 X4 (GAUZE/BANDAGES/DRESSINGS) ×1
COVER BACK TABLE REUSABLE LG (DRAPES) ×3 IMPLANT
COVER MAYO STAND REUSABLE (DRAPES) ×3 IMPLANT
COVER PROBE W GEL 5X96 (DRAPES) ×3 IMPLANT
COVER WAND RF STERILE (DRAPES) ×1 IMPLANT
DECANTER SPIKE VIAL GLASS SM (MISCELLANEOUS) ×1 IMPLANT
DERMABOND ADVANCED (GAUZE/BANDAGES/DRESSINGS) ×2
DERMABOND ADVANCED .7 DNX12 (GAUZE/BANDAGES/DRESSINGS) ×1 IMPLANT
DRAPE HALF SHEET 70X43 (DRAPES) ×3 IMPLANT
DRAPE LAPAROSCOPIC ABDOMINAL (DRAPES) ×3 IMPLANT
DRAPE UTILITY XL STRL (DRAPES) ×3 IMPLANT
DRSG PAD ABDOMINAL 8X10 ST (GAUZE/BANDAGES/DRESSINGS) ×3 IMPLANT
ELECT COATED BLADE 2.86 ST (ELECTRODE) ×3 IMPLANT
ELECT REM PT RETURN 9FT ADLT (ELECTROSURGICAL) ×3
ELECTRODE REM PT RTRN 9FT ADLT (ELECTROSURGICAL) ×1 IMPLANT
GAUZE SPONGE 4X4 12PLY STRL (GAUZE/BANDAGES/DRESSINGS) ×5 IMPLANT
GLOVE BIOGEL PI IND STRL 6.5 (GLOVE) IMPLANT
GLOVE BIOGEL PI INDICATOR 6.5 (GLOVE) ×2
GLOVE ECLIPSE 6.5 STRL STRAW (GLOVE) ×2 IMPLANT
GLOVE EXAM NITRILE MD LF STRL (GLOVE) ×2 IMPLANT
GLOVE SURG SYN 7.5  E (GLOVE) ×2
GLOVE SURG SYN 7.5 E (GLOVE) ×1 IMPLANT
GLOVE SURG SYN 7.5 PF PI (GLOVE) ×1 IMPLANT
GOWN STRL REUS W/ TWL LRG LVL3 (GOWN DISPOSABLE) ×1 IMPLANT
GOWN STRL REUS W/ TWL XL LVL3 (GOWN DISPOSABLE) ×1 IMPLANT
GOWN STRL REUS W/TWL LRG LVL3 (GOWN DISPOSABLE) ×3
GOWN STRL REUS W/TWL XL LVL3 (GOWN DISPOSABLE) ×3
KIT MARKER MARGIN INK (KITS) ×3 IMPLANT
NDL HYPO 25X1 1.5 SAFETY (NEEDLE) ×1 IMPLANT
NDL SAFETY ECLIPSE 18X1.5 (NEEDLE) ×1 IMPLANT
NEEDLE HYPO 18GX1.5 SHARP (NEEDLE)
NEEDLE HYPO 25X1 1.5 SAFETY (NEEDLE) ×6 IMPLANT
NS IRRIG 1000ML POUR BTL (IV SOLUTION) ×3 IMPLANT
PACK BASIN DAY SURGERY FS (CUSTOM PROCEDURE TRAY) ×3 IMPLANT
PENCIL BUTTON HOLSTER BLD 10FT (ELECTRODE) ×3 IMPLANT
SLEEVE SCD COMPRESS KNEE MED (MISCELLANEOUS) ×3 IMPLANT
SPONGE LAP 18X18 RF (DISPOSABLE) ×5 IMPLANT
STRIP CLOSURE SKIN 1/2X4 (GAUZE/BANDAGES/DRESSINGS) ×2 IMPLANT
SUT MNCRL AB 4-0 PS2 18 (SUTURE) ×5 IMPLANT
SUT VICRYL 3-0 CR8 SH (SUTURE) ×7 IMPLANT
SYR CONTROL 10ML LL (SYRINGE) ×5 IMPLANT
TOWEL GREEN STERILE FF (TOWEL DISPOSABLE) ×3 IMPLANT
TRAY FAXITRON CT DISP (TRAY / TRAY PROCEDURE) ×3 IMPLANT
TUBE CONNECTING 20'X1/4 (TUBING) ×1
TUBE CONNECTING 20X1/4 (TUBING) ×2 IMPLANT
YANKAUER SUCT BULB TIP NO VENT (SUCTIONS) ×3 IMPLANT

## 2019-04-15 NOTE — Discharge Instructions (Signed)
[facility] - DISCHARGE INSTRUCTIONS TO PATIENT  Activity:  Driving - may drive in 2 to 4 days.   Lifting - No lifting more than 15 pounds for 5 days, then no limit                       Practice you Covid-19 protection:  Wear a mask, social distance, and wash your hands frequently  Wound Care:   Leave the incision dry for 2 days, then you may shower  Diet:  As tolerated  Follow up appointment:  Call Dr. Pollie Friar office [facility]) at [phone] for an appointment in 2 weeks..  Medications and dosages:  Resume your home medications.  You have a prescription for:  Vicodin  Call Dr. Lucia Gaskins or his office  [phone]) if you have:  Temperature greater than 100.4,  Persistent nausea and vomiting,  Severe uncontrolled pain,  Redness, tenderness, or signs of infection (pain, swelling, redness, odor or green/yellow discharge around the site),  Difficulty breathing, headache or visual disturbances,  Any other questions or concerns you may have after discharge.  In an emergency, call 911 or go to an Emergency Department at a nearby hospital.    Oxycodone 10mg  given at 5:15pm   [place] Instructions  Activity: Get plenty of rest for the remainder of the day. A responsible individual must stay with you for 24 hours following the procedure.  For the next 24 hours, DO NOT: -Drive a car -Paediatric nurse -Drink alcoholic beverages -Take any medication unless instructed by your physician -Make any legal decisions or sign important papers.  Meals: Start with liquid foods such as gelatin or soup. Progress to regular foods as tolerated. Avoid greasy, spicy, heavy foods. If nausea and/or vomiting occur, drink only clear liquids until the nausea and/or vomiting subsides. Call your physician if vomiting continues.  Special Instructions/Symptoms: Your throat may feel dry or sore from the anesthesia or the breathing tube placed in your  throat during surgery. If this causes discomfort, gargle with warm salt water. The discomfort should disappear within 24 hours.  If you had a scopolamine patch placed behind your ear for the management of post- operative nausea and/or vomiting:  1. The medication in the patch is effective for 72 hours, after which it should be removed.  Wrap patch in a tissue and discard in the trash. Wash hands thoroughly with soap and water. 2. You may remove the patch earlier than 72 hours if you experience unpleasant side effects which may include dry mouth, dizziness or visual disturbances. 3. Avoid touching the patch. Wash your hands with soap and water after contact with the patch.      Information for Discharge Teaching: EXPAREL (bupivacaine liposome injectable suspension)   Your surgeon or anesthesiologist gave you EXPAREL(bupivacaine) to help control your pain after surgery.   EXPAREL is a local anesthetic that provides pain relief by numbing the tissue around the surgical site.  EXPAREL is designed to release pain medication over time and can control pain for up to 72 hours.  Depending on how you respond to EXPAREL, you may require less pain medication during your recovery.  Possible side effects:  Temporary loss of sensation or ability to move in the area where bupivacaine was injected.  Nausea, vomiting, constipation  Rarely, numbness and tingling in your mouth or lips, lightheadedness, or anxiety may occur.  Call your doctor right away if you think you may be experiencing any of  these sensations, or if you have other questions regarding possible side effects.  Follow all other discharge instructions given to you by your surgeon or nurse. Eat a healthy diet and drink plenty of water or other fluids.  If you return to the hospital for any reason within 96 hours following the administration of EXPAREL, it is important for health care providers to know that you have received this  anesthetic. A teal colored band has been placed on your arm with the date, time and amount of EXPAREL you have received in order to alert and inform your health care providers. Please leave this armband in place for the full 96 hours following administration, and then you may remove the band.

## 2019-04-15 NOTE — Anesthesia Procedure Notes (Signed)
Procedure Name: LMA Insertion Date/Time: 04/15/2019 1:20 PM Performed by: Lavonia Dana, CRNA Pre-anesthesia Checklist: Patient identified, Emergency Drugs available, Suction available and Patient being monitored Patient Re-evaluated:Patient Re-evaluated prior to induction Oxygen Delivery Method: Circle system utilized Preoxygenation: Pre-oxygenation with 100% oxygen Induction Type: IV induction Ventilation: Mask ventilation without difficulty LMA: LMA inserted LMA Size: 4.0 Number of attempts: 1 Airway Equipment and Method: Bite block Placement Confirmation: positive ETCO2 Tube secured with: Tape Dental Injury: Teeth and Oropharynx as per pre-operative assessment

## 2019-04-15 NOTE — Transfer of Care (Signed)
Immediate Anesthesia Transfer of Care Note  Patient: [place]  Procedure(s) Performed: RIGHT BREAST LUMPECTOMY WITH RADIOACTIVE SEED AND  TARGETED RIGHT AXILLARY LYMPH NODE DISSECTION WITH RADIOACTIVE SEED (Right Breast)  Patient Location: PACU  Anesthesia Type:GA combined with regional for post-op pain  Level of Consciousness: awake, alert , oriented and patient cooperative  Airway & Oxygen Therapy: Patient Spontanous Breathing and Patient connected to face mask oxygen  Post-op Assessment: Report given to RN and Post -op Vital signs reviewed and stable  Post vital signs: Reviewed and stable  Last Vitals:  Vitals Value Taken Time  BP 161/102 04/15/19 1513  Temp    Pulse 107 04/15/19 1514  Resp 19 04/15/19 1514  SpO2 99 % 04/15/19 1514  Vitals shown include unvalidated device data.  Last Pain:  Vitals:   04/15/19 1144  TempSrc: Oral      Patients Stated Pain Goal: 4 ([id number])  Complications: No apparent anesthesia complications

## 2019-04-15 NOTE — Progress Notes (Signed)
nuc medicine injections completed, emotional support provided.

## 2019-04-15 NOTE — Interval H&P Note (Signed)
History and Physical Interval Note:  04/15/2019 12:37 PM  [place]  has presented today for surgery, with the diagnosis of RIGHT BREAST CANCER.  The various methods of treatment have been discussed with the patient and family.  She is ready for surgery.  After consideration of risks, benefits and other options for treatment, the patient has consented to  Procedure(s): RIGHT BREAST LUMPECTOMY WITH RADIOACTIVE SEED AND  TARGETED RIGHT AXILLARY LYMPH NODE DISSECTION WITH RADIOACTIVE SEED (Right) as a surgical intervention.  The patient's history has been reviewed, patient examined, no change in status, stable for surgery.  I have reviewed the patient's chart and labs.  Questions were answered to the patient's satisfaction.     Shann Medal

## 2019-04-15 NOTE — Anesthesia Postprocedure Evaluation (Signed)
Anesthesia Post Note  Patient: [place]  Procedure(s) Performed: RIGHT BREAST LUMPECTOMY WITH RADIOACTIVE SEED AND  TARGETED RIGHT AXILLARY LYMPH NODE DISSECTION WITH RADIOACTIVE SEED (Right Breast)     Patient location during evaluation: PACU Anesthesia Type: General Level of consciousness: awake and alert Pain management: pain level controlled Vital Signs Assessment: post-procedure vital signs reviewed and stable Respiratory status: spontaneous breathing, nonlabored ventilation, respiratory function stable and patient connected to nasal cannula oxygen Cardiovascular status: blood pressure returned to baseline and stable Postop Assessment: no apparent nausea or vomiting Anesthetic complications: no    Last Vitals:  Vitals:   04/15/19 1645 04/15/19 1745  BP: 128/64 138/69  Pulse: 86 76  Resp: 17 16  Temp:  36.7 C  SpO2: 95% 95%                  Effie Berkshire

## 2019-04-15 NOTE — Op Note (Addendum)
04/15/2019  3:08 PM  PATIENT:  Debbie [name] DOB: 28-Jan-1961 MRN: [id number]  PREOP DIAGNOSIS:   RIGHT BREAST CANCER  POSTOP DIAGNOSIS:    Right breast cancer, 1 o'clock position (T2, N1)  PROCEDURE:   Procedure(s): RIGHT BREAST LUMPECTOMY WITH RADIOACTIVE SEED AND  TARGETED RIGHT AXILLARY LYMPH NODE DISSECTION WITH RADIOACTIVE SEED, Injection of peri areolar area of breast with methylene blue (1.0 cc), deep sentinel lymph node biopsy  SURGEON:   Alphonsa Overall, M.D.  ANESTHESIA:   General  Anesthesiologist: Effie Berkshire, MD; Freddrick March, MD CRNA: Blocker, Ernesta Amble, CRNA; Lavonia Dana, CRNA  General  EBL:  75  ml  DRAINS:  none   LOCAL MEDICATIONS USED:   Right pectoral block  SPECIMEN:   Right breast lumpectomy (6 color paint set), Medial margin, Targeted right axillary node dissection (seed, counts 40, background 5, not blue)  COUNTS CORRECT:  YES  INDICATIONS FOR PROCEDURE:  Debbie [name] is a 58 y.o. (DOB: 09-14-1960) white female whose primary care physician is Debbie [name], Debbie [name], [place] and comes for right breast lumpectomy and targeted right axillary lymph node dissection.   She was originally seen at the [facility] with Drs. Magrinat and Kinard.  She has undergone neoadjuvant chemotherapy and has had a good response.  She now comes for surgery.   The options for breast cancer treatment have been discussed with the patient. She elected to proceed with lumpectomy and axillary sentinel lymph node.     The indications and potential complications of surgery were explained to the patient. Potential complications include, but are not limited to, bleeding, infection, the need for further surgery, and nerve injury.     She had a I131 seed placed on 04/14/2019 in her right breast at [facility].  The seed is in the 1 o'clock position of the right breast.  She also had a seed placed in her right axilla  In the holding area, her right areola  was injected with 1 millicurie of Technitium Sulfur Colloid.  OPERATIVE NOTE:   The patient was taken to operating room # 3 at [facility] where she underwent a general anesthesia  supervised by Anesthesiologist: Effie Berkshire, MD; Freddrick March, MD CRNA: Blocker, Ernesta Amble, CRNA; Lavonia Dana, CRNA. Her right breast and axilla were prepped with  ChloraPrep and sterilely draped.    A time-out and the surgical check list was reviewed.    I injected about 1.0 mL of methylene blue around her right areola.   The cancer was about at the 1 o'clock position of the right breast.   It was 3 cm from the areola.  I used the Neoprobe to identify the I131 seed. I made a medial circumareolar incision to get to the cancer.  I tried to excise an area around the tumor of at least 1 cm.    I excised this block of breast tissue approximately 4 cm by 4 cm  in diameter.   I painted the lumpectomy specimen with the 6 color paint kit and did a specimen mammogram which confirmed the mass, clip, and the seed were all in the right position in the specimen.  The specimen was sent to pathology who called back to confirm that they have the seed and the specimen.   I did excise the medial margin and painted this specimen to orient it.   I then started the right deep axillary sentinel lymph node biopsy. I made  an incision in the right axilla.  I found a hot area at the junction of the breast and the pectoralis major muscle, deep in the axilla. I cut down and  identified a node with the seed in it.  I also found a hot node that had counts of 40 and the background has 5 counts. This was taken out as one block of nodes.  The lymph node was not blue. I checked her internal mammary nodes and supraclavicular nodes with the neoprobe and found no other hot area. The axillary node was then sent to pathology.    I then irrigated the wound with saline. The patient had a right pectoral block, so I placed no more local.  I placed  4 clips to mark biopsy cavity, at 12, 3, 6, and 9 o'clock.  I then closed all the wounds in layers using 3-0 Vicryl sutures for the deep layer. At the skin, I closed the incisions with a 4-0 Monocryl suture. The incisions were then painted with Dermabond.  She had gauze place over the wounds and placed in a breast binder.   The patient tolerated the procedure well, was transported to the recovery room in good condition. Sponge and needle count were correct at the end of the case.   Final pathology is pending.    Right breast lumpectomy   Alphonsa Overall, MD, [facility] Pager: [phone] Office phone:  [phone]

## 2019-04-15 NOTE — Anesthesia Preprocedure Evaluation (Addendum)
Anesthesia Evaluation  Patient identified by MRN, date of birth, ID band Patient awake    Reviewed: Allergy & Precautions, NPO status , Patient's Chart, lab work & pertinent test results  History of Anesthesia Complications (+) PONV and history of anesthetic complications  Airway Mallampati: III  TM Distance: >3 FB Neck ROM: Full    Dental  (+) Teeth Intact, Dental Advisory Given   Pulmonary asthma , sleep apnea ,    breath sounds clear to auscultation       Cardiovascular hypertension, Pt. on medications  Rhythm:Regular Rate:Normal     Neuro/Psych Anxiety    GI/Hepatic Neg liver ROS, GERD  Medicated,  Endo/Other  negative endocrine ROS  Renal/GU negative Renal ROS     Musculoskeletal  (+) Arthritis ,   Abdominal Normal abdominal exam  (+)   Peds  Hematology negative hematology ROS (+)   Anesthesia Other Findings   Reproductive/Obstetrics                            Anesthesia Physical Anesthesia Plan  ASA: II  Anesthesia Plan: General   Post-op Pain Management: GA combined w/ Regional for post-op pain   Induction: Intravenous  PONV Risk Score and Plan: 4 or greater and Ondansetron, Dexamethasone, Scopolamine patch - Pre-op and Midazolam  Airway Management Planned: LMA  Additional Equipment: None  Intra-op Plan:   Post-operative Plan: Extubation in OR  Informed Consent: I have reviewed the patients History and Physical, chart, labs and discussed the procedure including the risks, benefits and alternatives for the proposed anesthesia with the patient or authorized representative who has indicated his/her understanding and acceptance.     Dental advisory given  Plan Discussed with: CRNA  Anesthesia Plan Comments:        Anesthesia Quick Evaluation

## 2019-04-15 NOTE — Progress Notes (Signed)
Assisted Dr. Smith Robert with right, ultrasound guided, pectoralis block. Side rails up, monitors on throughout procedure. See vital signs in flow sheet. Tolerated Procedure well.

## 2019-04-15 NOTE — Anesthesia Procedure Notes (Signed)
Anesthesia Regional Block: Pectoralis block   Pre-Anesthetic Checklist: ,, timeout performed, Correct Patient, Correct Site, Correct Laterality, Correct Procedure, Correct Position, site marked, Risks and benefits discussed,  Surgical consent,  Pre-op evaluation,  At surgeon's request and post-op pain management  Laterality: Right  Prep: chloraprep       Needles:  Injection technique: Single-shot  Needle Type: Echogenic Stimulator Needle     Needle Length: 9cm  Needle Gauge: 21     Additional Needles:   Procedures:,,,, ultrasound used (permanent image in chart),,,,  Narrative:  Start time: 04/15/2019 12:15 PM End time: 04/15/2019 12:25 PM Injection made incrementally with aspirations every 5 mL.  Performed by: Personally  Anesthesiologist: Effie Berkshire, MD  Additional Notes: Patient tolerated the procedure well. Local anesthetic introduced in an incremental fashion under minimal resistance after negative aspirations. No paresthesias were elicited. After completion of the procedure, no acute issues were identified and patient continued to be monitored by RN.

## 2019-04-16 ENCOUNTER — Encounter (HOSPITAL_BASED_OUTPATIENT_CLINIC_OR_DEPARTMENT_OTHER): Payer: Self-pay | Admitting: Surgery

## 2019-04-19 ENCOUNTER — Other Ambulatory Visit: Payer: Self-pay | Admitting: Oncology

## 2019-04-19 NOTE — Progress Notes (Signed)
Called and left her a message regarding her excellent surgical results

## 2019-04-30 ENCOUNTER — Telehealth: Payer: Self-pay | Admitting: *Deleted

## 2019-04-30 DIAGNOSIS — H0102A Squamous blepharitis right eye, upper and lower eyelids: Secondary | ICD-10-CM | POA: Diagnosis not present

## 2019-04-30 DIAGNOSIS — H179 Unspecified corneal scar and opacity: Secondary | ICD-10-CM | POA: Diagnosis not present

## 2019-04-30 DIAGNOSIS — H02889 Meibomian gland dysfunction of unspecified eye, unspecified eyelid: Secondary | ICD-10-CM | POA: Diagnosis not present

## 2019-04-30 DIAGNOSIS — H25813 Combined forms of age-related cataract, bilateral: Secondary | ICD-10-CM | POA: Diagnosis not present

## 2019-04-30 NOTE — Telephone Encounter (Signed)
Records faxed to [facility] - attn Barbaraann Boys - Release [id number]

## 2019-05-03 DIAGNOSIS — G4733 Obstructive sleep apnea (adult) (pediatric): Secondary | ICD-10-CM | POA: Diagnosis not present

## 2019-05-04 ENCOUNTER — Inpatient Hospital Stay: Payer: BC Managed Care – PPO

## 2019-05-04 ENCOUNTER — Inpatient Hospital Stay: Payer: BC Managed Care – PPO | Admitting: Adult Health

## 2019-05-04 ENCOUNTER — Inpatient Hospital Stay: Payer: BC Managed Care – PPO | Attending: Oncology

## 2019-05-04 ENCOUNTER — Encounter: Payer: Self-pay | Admitting: Adult Health

## 2019-05-04 ENCOUNTER — Other Ambulatory Visit: Payer: Self-pay

## 2019-05-04 ENCOUNTER — Encounter: Payer: Self-pay | Admitting: Oncology

## 2019-05-04 VITALS — BP 140/79 | HR 68 | Temp 97.8°F | Resp 18 | Ht 66.0 in | Wt 184.2 lb

## 2019-05-04 DIAGNOSIS — Z170 Estrogen receptor positive status [ER+]: Secondary | ICD-10-CM

## 2019-05-04 DIAGNOSIS — C50211 Malignant neoplasm of upper-inner quadrant of right female breast: Secondary | ICD-10-CM | POA: Insufficient documentation

## 2019-05-04 DIAGNOSIS — Z5112 Encounter for antineoplastic immunotherapy: Secondary | ICD-10-CM | POA: Diagnosis not present

## 2019-05-04 DIAGNOSIS — Z79899 Other long term (current) drug therapy: Secondary | ICD-10-CM | POA: Diagnosis not present

## 2019-05-04 LAB — COMPREHENSIVE METABOLIC PANEL
ALT: 13 U/L (ref 0–44)
AST: 19 U/L (ref 15–41)
Albumin: 4 g/dL (ref 3.5–5.0)
Alkaline Phosphatase: 123 U/L (ref 38–126)
Anion gap: 9 (ref 5–15)
BUN: 10 mg/dL (ref 6–20)
CO2: 28 mmol/L (ref 22–32)
Calcium: 9.8 mg/dL (ref 8.9–10.3)
Chloride: 106 mmol/L (ref 98–111)
Creatinine, Ser: 0.81 mg/dL (ref 0.44–1.00)
GFR calc Af Amer: >60 mL/min (ref >60)
GFR calc non Af Amer: >60 mL/min (ref >60)
Glucose, Bld: 90 mg/dL (ref 70–99)
Potassium: 3.6 mmol/L (ref 3.5–5.1)
Sodium: 143 mmol/L (ref 135–145)
Total Bilirubin: 0.4 mg/dL (ref 0.3–1.2)
Total Protein: 6.6 g/dL (ref 6.5–8.1)

## 2019-05-04 LAB — CBC WITH DIFFERENTIAL/PLATELET
Abs Immature Granulocytes: 0.01 10*3/uL (ref 0.00–0.07)
Basophils Absolute: 0 10*3/uL (ref 0.0–0.1)
Basophils Relative: 0 %
Eosinophils Absolute: 0.4 10*3/uL (ref 0.0–0.5)
Eosinophils Relative: 6 %
HCT: 37.3 % (ref 36.0–46.0)
Hemoglobin: 12.6 g/dL (ref 12.0–15.0)
Immature Granulocytes: 0 %
Lymphocytes Relative: 33 %
Lymphs Abs: 1.8 10*3/uL (ref 0.7–4.0)
MCH: 31.5 pg (ref 26.0–34.0)
MCHC: 33.8 g/dL (ref 30.0–36.0)
MCV: 93.3 fL (ref 80.0–100.0)
Monocytes Absolute: 0.6 10*3/uL (ref 0.1–1.0)
Monocytes Relative: 10 %
Neutro Abs: 2.8 10*3/uL (ref 1.7–7.7)
Neutrophils Relative %: 51 %
Platelets: 241 10*3/uL (ref 150–400)
RBC: 4 MIL/uL (ref 3.87–5.11)
RDW: 11.4 % — ABNORMAL LOW (ref 11.5–15.5)
WBC: 5.5 10*3/uL (ref 4.0–10.5)
nRBC: 0 % (ref 0.0–0.2)

## 2019-05-04 MED ORDER — TRASTUZUMAB-DKST CHEMO 150 MG IV SOLR
6.0000 mg/kg | Freq: Once | INTRAVENOUS | Status: AC
  Administered 2019-05-04: 12:00:00 504 mg via INTRAVENOUS
  Filled 2019-05-04: qty 24

## 2019-05-04 MED ORDER — HEPARIN SOD (PORK) LOCK FLUSH 100 UNIT/ML IV SOLN
500.0000 [IU] | Freq: Once | INTRAVENOUS | Status: AC | PRN
  Administered 2019-05-04: 12:00:00 500 [IU]
  Filled 2019-05-04: qty 5

## 2019-05-04 MED ORDER — ACETAMINOPHEN 325 MG PO TABS
650.0000 mg | ORAL_TABLET | Freq: Once | ORAL | Status: AC
  Administered 2019-05-04: 11:00:00 650 mg via ORAL

## 2019-05-04 MED ORDER — SODIUM CHLORIDE 0.9% FLUSH
10.0000 mL | INTRAVENOUS | Status: DC | PRN
  Administered 2019-05-04: 12:00:00 10 mL
  Filled 2019-05-04: qty 10

## 2019-05-04 MED ORDER — ACETAMINOPHEN 325 MG PO TABS
ORAL_TABLET | ORAL | Status: AC
  Filled 2019-05-04: qty 2

## 2019-05-04 MED ORDER — DIPHENHYDRAMINE HCL 25 MG PO CAPS
25.0000 mg | ORAL_CAPSULE | Freq: Once | ORAL | Status: AC
  Administered 2019-05-04: 11:00:00 25 mg via ORAL

## 2019-05-04 MED ORDER — SODIUM CHLORIDE 0.9 % IV SOLN
Freq: Once | INTRAVENOUS | Status: AC
  Administered 2019-05-04: 11:00:00 via INTRAVENOUS
  Filled 2019-05-04: qty 250

## 2019-05-04 MED ORDER — DIPHENHYDRAMINE HCL 25 MG PO CAPS
ORAL_CAPSULE | ORAL | Status: AC
  Filled 2019-05-04: qty 2

## 2019-05-04 NOTE — Progress Notes (Addendum)
[place]  [phone]) [phone] [phone]    ID: NAYELY DINGUS DOB: 1961/02/04  MR#: [id number]  QQI#:[id number]  Patient Care Team: Lavada Mesi as PCP - General (Family Medicine) Mauro Kaufmann, RN as Oncology Nurse Navigator Rockwell Germany, RN as Oncology Nurse Navigator Alphonsa Overall, MD as Consulting Physician (General Surgery) Magrinat, Virgie Dad, MD as Consulting Physician (Oncology) Gery Pray, MD as Consulting Physician (Radiation Oncology) Burden, Lincoln Brigham, MD as Referring Physician (Ophthalmology) Larey Dresser, MD as Consulting Physician (Cardiology) OTHER MD: Earl Lagos, MD (Ophthalmology)   CHIEF COMPLAINT: HER-2 positive breast cancer  CURRENT TREATMENT: maintenance Trastuzumab, adjuvant radiation pending   INTERVAL HISTORY: Danilyn returns today for follow-up and treatment of her HER-2 positive breast cancer. She is due for Trastuzumab today and continues to tolerate it well.    She has completed neoadjuvant chemotherapy and underwent right breast lumpectomy on 04/15/2019.  This showed no residual carcinoma, negative margins, and 4 lymph nodes were not involved with carcinoma.  She is recovering well from surgery, however she has been seeing Dr. Lucia Gaskins weekly due to seroma in her axilla.  She had 80cc drained last week and thinks she may need more drained tomorrow.  She had a slight infection following the surgery and is on an antibiotic.  She thinks it is septra.    Her most recent echocardiogram was on 03/10/2019 and shows an EF of 60-65%.  She has an upcoming appointment with Dr. Haroldine Laws on 06/09/2019.     REVIEW OF SYSTEMS: Mariko is doing well today.  She denies any new issues such as fever, chills, chest pain, palpitations, or cough.  She has no bowel/bladder changes, nausea, or vomiting.  She is getting back into exercise with walking.  A detailed ROS was otherwise non contributory.     HISTORY OF  CURRENT ILLNESS: From the original intake note:  Kerrick had routine screening mammography on 10/21/2018 showing a possible abnormality in the right breast. She felt a lump in her breast, possibly dating back to 01/2018, but she didn't think anything about it due to her history with fibrous breasts; she has had fibrous breasts all her life and began to have mammograms at the age of 58.   She underwent right breast ultrasonography at [facility] on 10/28/2018 showing: On physical exam, a firm, fixed mass is palpated in the superior right breast. Targeted ultrasound is performed, showing an irregular hypoechoic mass at the 1 o'clock position 4 cm from the nipple. It measures 2.8 x 2.4 x 1.8 cm. There is associated peripheral and internal vascularity. An oval, circumscribed hypoechoic mass is also identified within the vicinity at the 1 o'clock position 4 cm from the nipple. It measures 1.0 x 0.9 x 0.4 cm. This corresponds with an additional mammographically identified mass in the upper inner quadrant at middle depth. This is mammographically stable dating back to at least 2014. Additional stable, circumscribed masses are scattered throughout the bilateral breasts mammographically. Evaluation of the right axilla demonstrates a single, markedly abnormal lymph node with cortical thickening up to 1 cm. No additional suspicious lymphadenopathy is identified.  Accordingly on 10/28/2018 she proceeded to biopsy of the right breast area in question. The pathology from this procedure showed ([id number]): invasive ductal carcinoma, nottingham grade II of III, 2.8 cm, 1 o'clock, 4.0 cm from the nipple. Prognostic indicators significant for: estrogen receptor, 90% positive, with strong staining intensity and progesterone receptor, 0% negative. Proliferation marker  Ki67 at 20%. HER2 Positive (3+) by immunohistochemistry.   An additional biopsy of the right axilla was performed on the same day ([id number])  showing:  2. Lymph node, needle/core biopsy, inferior right axilla - Tumor cells within fragmented nodal tissue  The patient's subsequent history is as detailed above.   PAST MEDICAL HISTORY: Past Medical History:  Diagnosis Date   Anxiety    Asthma    with severe URIs   Bronchitis    Essential hypertension, benign 12/01/2012   Generalized anxiety disorder 12/01/2012   GERD (gastroesophageal reflux disease)    Hypertension    lost over 100lbs, no BP meds now, HCTZ is for edema   Irritable bowel syndrome (IBS)    "had for years," per patient   PONV (postoperative nausea and vomiting)    Seasonal allergies    Sleep apnea    uses CPAP nightly     PAST SURGICAL HISTORY: Past Surgical History:  Procedure Laterality Date   ABDOMINAL HYSTERECTOMY  2011   with vaginal sling   ANKLE SURGERY     BREAST CYST EXCISION Bilateral    BREAST EXCISIONAL BIOPSY Bilateral    BREAST LUMPECTOMY WITH RADIOACTIVE SEED AND AXILLARY LYMPH NODE DISSECTION Right 04/15/2019   Procedure: RIGHT BREAST LUMPECTOMY WITH RADIOACTIVE SEED AND  TARGETED RIGHT AXILLARY LYMPH NODE DISSECTION WITH RADIOACTIVE SEED;  Surgeon: Alphonsa Overall, MD;  Location: [facility];  Service: General;  Laterality: Right;   CHOLECYSTECTOMY  2012   KNEE ARTHROSCOPY W/ MEDIAL COLLATERAL LIGAMENT (MCL) REPAIR Left 10-07-2012   PORTACATH PLACEMENT N/A 11/16/2018   Procedure: INSERTION PORT-A-CATH WITH ULTRASOUND;  Surgeon: Alphonsa Overall, MD;  Location: [facility];  Service: General;  Laterality: N/A;   TONSILLECTOMY  1969     FAMILY HISTORY: Family History  Problem Relation Age of Onset   Hypertension Mother    Rheum arthritis Mother    Heart failure Mother    Hypertension Father    Alcoholism Father    Colon cancer Father    Lung cancer Father    Depression Father    Diabetes Father    Skin cancer Father    Hypertension Brother    Stroke Brother    Atrial fibrillation Brother     Hyperlipidemia Maternal Grandfather    Stroke Paternal Grandmother    Skin cancer Paternal 51    Healthy Daughter    Healthy Daughter    Breast cancer Neg Hx    Ovarian cancer Neg Hx    Prostate cancer Neg Hx    Pancreatic cancer Neg Hx    Genelda's father died from lung cancer at age 53. Patients' mother died from congestive heart failure at age 76. The patient has 1 brother. Patient denies anyone in her family having breast, ovarian, prostate, or pancreatic cancer. Adela's father was diagnosed with skin cancer, colon cancer, and lung cancer. [name]'s paternal grandmother was diagnosed with skin cancer.    GYNECOLOGIC HISTORY:  No LMP recorded. Patient has had a hysterectomy. 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 Her husband, Thayer Inabinet, is in [employer]	[place]. They have two daughters, Kerri Perches and [place]. [address] Dawna Part is 12, lives in [place], [place], and works in Building surveyor. Adrian [place] is 53, lives in [place], [place], and is  currently in school; he currently works part time as a Publishing copy.   ADVANCED DIRECTIVES: In the absence of any documentation, Cleta's spouse, [name], is her healthcare power of attorney.      HEALTH MAINTENANCE: Social History   Tobacco Use   Smoking status: Never Smoker   Smokeless tobacco: Never Used  Substance Use Topics   Alcohol use: Yes    Comment: rare   Drug use: No    Colonoscopy: yes, 2015, [facility]   PAP: 2011  Bone density: no Mammography: 10/21/2018  Allergies  Allergen Reactions   Azithromycin Diarrhea   Erythromycin Itching   Erythromycin Base Itching   Levofloxacin     Tendon pain   Penicillin G Rash, Swelling and Hives    Penicillins Swelling and Rash    Did it involve swelling of the face/tongue/throat, SOB, or low BP? Yes Did it involve sudden or severe rash/hives, skin peeling, or any reaction on the inside of your mouth or nose? No Did you need to seek medical attention at a hospital or doctor's office? Yes When did it last happen?40+ years ago If all above answers are NO, may proceed with cephalosporin use.     Current Outpatient Medications  Medication Sig Dispense Refill   AMBULATORY NON FORMULARY MEDICATION Continuous positive airway pressure (CPAP) machine set at autopap, with all supplemental supplies as needed. 1 each 0   calcium-vitamin D (OSCAL WITH D) 500-200 MG-UNIT tablet Take 1 tablet by mouth daily with breakfast.     hydrochlorothiazide (HYDRODIURIL) 12.5 MG tablet TAKE 1 TABLET DAILY (Patient taking differently: No sig reported) 90 tablet 0   HYDROcodone-acetaminophen (NORCO/VICODIN) 5-325 MG tablet Take 1 tablet by mouth every 6 (six) hours as needed for moderate pain. 15 tablet 0   loratadine (CLARITIN) 10 MG tablet Take 10 mg by mouth daily.     montelukast (SINGULAIR) 10 MG tablet TAKE 1 TABLET AT BEDTIME 90 tablet 3   Multiple Vitamin (MULTIVITAMIN WITH MINERALS) TABS tablet Take 1 tablet by mouth daily.     pantoprazole (PROTONIX) 40 MG tablet TAKE 1 TABLET DAILY (Patient taking differently: Take 40 mg by mouth daily. ) 90 tablet 4   potassium chloride (MICRO-K) 10 MEQ CR capsule Take 1 capsule (10 mEq total) by mouth daily. 90 capsule 1   PROAIR HFA 108 (90 Base) MCG/ACT inhaler USE 2 INHALATIONS EVERY 4 HOURS AS NEEDED FOR WHEEZING (Patient taking differently: Inhale 2 puffs into the lungs every 4 (four) hours as needed for wheezing or shortness of breath. ) 25.5 g 1   traMADol (ULTRAM) 50 MG tablet Take 1-2 tablets (50-100 mg total) by mouth every 6 (six) hours as needed. 14 tablet 1   valACYclovir (VALTREX) 1000 MG tablet Take 1 tablet (1,000 mg total) by mouth 2  (two) times daily as needed (cold sore). TAKE 2 TABLETS EVERY 12 HOURS FOR ONE DAY, TAKE AT THE ONSET OF COLD SORE 21 tablet 1   zolpidem (AMBIEN) 5 MG tablet Take 1 tablet (5 mg total) by mouth at bedtime as needed for sleep. 20 tablet 0   Liraglutide -Weight Management (SAXENDA) 18 MG/3ML SOPN Inject 3 mg into the skin daily. Please include ultra fine needles 22m 15 pen 1   No current facility-administered medications for this visit.    Facility-Administered Medications Ordered in Other Visits  Medication Dose Route Frequency Provider Last Rate Last Dose   sodium chloride flush (NS) 0.9 % injection 10 mL  10 mL Intracatheter PRN  Gardenia Phlegm, NP   10 mL at 05/04/19 1217     OBJECTIVE:  Vitals:   05/04/19 0835  BP: 140/79  Pulse: 68  Resp: 18  Temp: 97.8 F (36.6 C)  SpO2: 100%     Body mass index is 29.73 kg/m.   Wt Readings from Last 3 Encounters:  05/04/19 184 lb 3.2 oz (83.6 kg)  04/15/19 187 lb 13.3 oz (85.2 kg)  03/23/19 190 lb (86.2 kg)  ECOG FS:1 - Symptomatic but completely ambulatory GENERAL: Patient is a well appearing female in no acute distress HEENT:  Sclerae anicteric.  Oropharynx clear and moist. No ulcerations or evidence of oropharyngeal candidiasis. Neck is supple.  NODES:  No cervical, supraclavicular, or axillary lymphadenopathy palpated.  BREAST EXAM:  Breasts inspected only, right breast s/p lumpectomy, mild swelling present at lumpectomy site LUNGS:  Clear to auscultation bilaterally.  No wheezes or rhonchi. HEART:  Regular rate and rhythm. No murmur appreciated. ABDOMEN:  Soft, nontender.  Positive, normoactive bowel sounds. No organomegaly palpated. MSK:  No focal spinal tenderness to palpation. Full range of motion bilaterally in the upper extremities. EXTREMITIES:  No peripheral edema.   SKIN:  Clear with no obvious rashes or skin changes. No nail dyscrasia. NEURO:  Nonfocal. Well oriented.  Appropriate affect. GYN: declines exam as  area isn't bothering her    LAB RESULTS:  CMP     Component Value Date/Time   NA 143 05/04/2019 0810   K 3.6 05/04/2019 0810   CL 106 05/04/2019 0810   CO2 28 05/04/2019 0810   GLUCOSE 90 05/04/2019 0810   BUN 10 05/04/2019 0810   CREATININE 0.81 05/04/2019 0810   CREATININE 0.71 01/20/2019 0752   CREATININE 0.66 07/08/2017 1127   CALCIUM 9.8 05/04/2019 0810   PROT 6.6 05/04/2019 0810   ALBUMIN 4.0 05/04/2019 0810   AST 19 05/04/2019 0810   AST 42 (H) 01/20/2019 0752   ALT 13 05/04/2019 0810   ALT 42 01/20/2019 0752   ALKPHOS 123 05/04/2019 0810   BILITOT 0.4 05/04/2019 0810   BILITOT 0.5 01/20/2019 0752   GFRNONAA >60 05/04/2019 0810   GFRNONAA >60 01/20/2019 0752   GFRNONAA 98 07/08/2017 1127   GFRAA >60 05/04/2019 0810   GFRAA >60 01/20/2019 0752   GFRAA 114 07/08/2017 1127    No results found for: TOTALPROTELP, ALBUMINELP, A1GS, A2GS, BETS, BETA2SER, GAMS, MSPIKE, SPEI  No results found for: KPAFRELGTCHN, LAMBDASER, KAPLAMBRATIO  Lab Results  Component Value Date   WBC 5.5 05/04/2019   NEUTROABS 2.8 05/04/2019   HGB 12.6 05/04/2019   HCT 37.3 05/04/2019   MCV 93.3 05/04/2019   PLT 241 05/04/2019    _0 @  No results found for: LABCA2  No components found for: [id number]  No results for input(s): INR in the last 168 hours.  No results found for: LABCA2  No results found for: OYD741  No results found for: OIN867  No results found for: EHM094  No results found for: CA2729  No components found for: HGQUANT  No results found for: CEA1 / No results found for: CEA1   No results found for: AFPTUMOR  No results found for: CHROMOGRNA  No results found for: PSA1  Appointment on 05/04/2019  Component Date Value Ref Range Status   WBC 05/04/2019 5.5  4.0 - 10.5 K/uL Final   RBC 05/04/2019 4.00  3.87 - 5.11 MIL/uL Final   Hemoglobin 05/04/2019 12.6  12.0 - 15.0 g/dL Final   HCT 05/04/2019 37.3  36.0 - 46.0 % Final   MCV  05/04/2019 93.3  80.0 - 100.0 fL Final   MCH 05/04/2019 31.5  26.0 - 34.0 pg Final   MCHC 05/04/2019 33.8  30.0 - 36.0 g/dL Final   RDW 05/04/2019 11.4* 11.5 - 15.5 % Final   Platelets 05/04/2019 241  150 - 400 K/uL Final   nRBC 05/04/2019 0.0  0.0 - 0.2 % Final   Neutrophils Relative % 05/04/2019 51  % Final   Neutro Abs 05/04/2019 2.8  1.7 - 7.7 K/uL Final   Lymphocytes Relative 05/04/2019 33  % Final   Lymphs Abs 05/04/2019 1.8  0.7 - 4.0 K/uL Final   Monocytes Relative 05/04/2019 10  % Final   Monocytes Absolute 05/04/2019 0.6  0.1 - 1.0 K/uL Final   Eosinophils Relative 05/04/2019 6  % Final   Eosinophils Absolute 05/04/2019 0.4  0.0 - 0.5 K/uL Final   Basophils Relative 05/04/2019 0  % Final   Basophils Absolute 05/04/2019 0.0  0.0 - 0.1 K/uL Final   Immature Granulocytes 05/04/2019 0  % Final   Abs Immature Granulocytes 05/04/2019 0.01  0.00 - 0.07 K/uL Final   Performed at [facility], [place] [address], [place], [place] [address]   Sodium 05/04/2019 143  135 - 145 mmol/L Final   Potassium 05/04/2019 3.6  3.5 - 5.1 mmol/L Final   Chloride 05/04/2019 106  98 - 111 mmol/L Final   CO2 05/04/2019 28  22 - 32 mmol/L Final   Glucose, Bld 05/04/2019 90  70 - 99 mg/dL Final   BUN 05/04/2019 10  6 - 20 mg/dL Final   Creatinine, Ser 05/04/2019 0.81  0.44 - 1.00 mg/dL Final   Calcium 05/04/2019 9.8  8.9 - 10.3 mg/dL Final   Total Protein 05/04/2019 6.6  6.5 - 8.1 g/dL Final   Albumin 05/04/2019 4.0  3.5 - 5.0 g/dL Final   AST 05/04/2019 19  15 - 41 U/L Final   ALT 05/04/2019 13  0 - 44 U/L Final   Alkaline Phosphatase 05/04/2019 123  38 - 126 U/L Final   Total Bilirubin 05/04/2019 0.4  0.3 - 1.2 mg/dL Final   GFR calc non Af Amer 05/04/2019 >60  >60 mL/min Final   GFR calc Af Amer 05/04/2019 >60  >60 mL/min Final   Anion gap 05/04/2019 9  5 - 15 Final   Performed at [facility], [place] [address],  [place], [place] [address]    (this displays the last labs from the last 3 days)  No results found for: TOTALPROTELP, ALBUMINELP, A1GS, A2GS, BETS, BETA2SER, GAMS, MSPIKE, SPEI (this displays SPEP labs)  No results found for: KPAFRELGTCHN, LAMBDASER, KAPLAMBRATIO (kappa/lambda light chains)  No results found for: HGBA, HGBA2QUANT, HGBFQUANT, HGBSQUAN (Hemoglobinopathy evaluation)   No results found for: LDH  Lab Results  Component Value Date   IRON 46 05/11/2013   TIBC 254 05/11/2013   IRONPCTSAT 18 (L) 05/11/2013   (Iron and TIBC)  Lab Results  Component Value Date   FERRITIN 72 05/11/2013    Urinalysis    Component Value Date/Time   BILIRUBINUR Small 11/09/2018 0914   PROTEINUR Negative 11/09/2018 0914   UROBILINOGEN 0.2 11/09/2018 0914   NITRITE Negative 11/09/2018 0914   LEUKOCYTESUR Negative 11/09/2018 0914     STUDIES:  Nm Sentinel Node Inj-no Rpt (breast)  Result Date: 04/15/2019 Sulfur colloid was injected by the nuclear medicine technologist for melanoma sentinel node.   Mm Breast  Surgical Specimen  Result Date: 04/15/2019 CLINICAL DATA:  Patient status post right axillary lymph node removal. EXAM: SPECIMEN RADIOGRAPH OF THE RIGHT BREAST COMPARISON:  Previous exam(s). FINDINGS: Status post excision of the right breast. The radioactive seed and biopsy marker clip are present, completely intact, and were marked for pathology. IMPRESSION: Specimen radiograph of the right breast. Electronically Signed   By: Lovey Newcomer M.D.   On: 04/15/2019 14:25   Mm Breast Surgical Specimen  Result Date: 04/15/2019 CLINICAL DATA:  Status post seed localized RIGHT lumpectomy. EXAM: SPECIMEN RADIOGRAPH OF THE RIGHT BREAST COMPARISON:  04/13/2028 and earlier FINDINGS: Status post excision of the right breast. The radioactive seed and biopsy marker clip are present, completely intact, and were marked for pathology. IMPRESSION: Specimen radiograph of the right breast. Electronically  Signed   By: Nolon Nations M.D.   On: 04/15/2019 14:20   Mm Rt Radioactive Seed Loc Mammo Guide  Result Date: 04/14/2019 CLINICAL DATA:  Patient presents for radioactive seed localization of a right breast carcinoma prior to surgical excision. Patient also had radioactive seed localization a metastatic right axillary lymph node, dictated separately. EXAM: MAMMOGRAPHIC GUIDED RADIOACTIVE SEED LOCALIZATION OF THE RIGHT BREAST COMPARISON:  Previous exam(s). FINDINGS: Patient presents for radioactive seed localization prior to surgical excision. I met with the patient and we discussed the procedure of seed localization including benefits and alternatives. We discussed the high likelihood of a successful procedure. We discussed the risks of the procedure including infection, bleeding, tissue injury and further surgery. We discussed the low dose of radioactivity involved in the procedure. Informed, written consent was given. The usual time-out protocol was performed immediately prior to the procedure. Using mammographic guidance, sterile technique, 1% lidocaine and an I-125 radioactive seed, the ribbon shaped biopsy clip was localized using a medial approach. The follow-up mammogram images confirm the seed in the expected location and were marked for Dr. Lucia Gaskins. Follow-up survey of the patient confirms presence of the radioactive seed. Order number of I-125 seed:  [id number]. Total activity:  8.250 millicuries.  Reference Date: 03/26/2019 The patient tolerated the procedure well and was released from the [facility]. She was given instructions regarding seed removal. IMPRESSION: Radioactive seed localization of the right breast. No apparent complications. Electronically Signed   By: Lajean Manes M.D.   On: 04/14/2019 11:49   [place] Rt Radioactive Seed Ea Add Lesion  Result Date: 04/14/2019 CLINICAL DATA:  Patient presents for radioactive seed localization of a metastatic right axillary lymph node prior to  surgical excision. Patient also underwent mammographically guided radioactive seed localization of a right breast carcinoma, dictated separately. EXAM: ULTRASOUND GUIDED RADIOACTIVE SEED LOCALIZATION OF THE RIGHT AXILLA COMPARISON:  Previous exam(s). FINDINGS: Patient presents for radioactive seed localization prior to . I met with the patient and we discussed the procedure of seed localization including benefits and alternatives. We discussed the high likelihood of a successful procedure. We discussed the risks of the procedure including infection, bleeding, tissue injury and further surgery. We discussed the low dose of radioactivity involved in the procedure. Informed, written consent was given. The usual time-out protocol was performed immediately prior to the procedure. Using ultrasound guidance, sterile technique, 1% lidocaine and an I-125 radioactive seed, the HydroMARK biopsy clip was localized using an inferior approach. Clip was sonographically visualized deploying within the residual metastatic right axillary lymph node adjacent to the [facility] clip. Follow-up survey of the patient confirms presence of the radioactive seed. Order number of I-125 seed:  [id number]. Total  activity:  1.275 millicuries.  Reference Date: 03/26/2019 The patient tolerated the procedure well and was released from the [place]. She was given instructions regarding seed removal. IMPRESSION: Radioactive seed localization of a previously biopsied metastatic right axillary lymph node. No apparent complications. Electronically Signed   By: Lajean Manes M.D.   On: 04/14/2019 11:52     ELIGIBLE FOR AVAILABLE RESEARCH PROTOCOL: no   ASSESSMENT: 58 y.o. Jule Ser, [place] woman status post right breast upper inner quadrant biopsy 10/28/2018 for a clinical T2 N1, stage IIA invasive ductal carcinoma, grade 2, estrogen receptor positive, progesterone receptor negative, HER-2 amplified, with an MIB-1 of 20%  (a) staging work-up  with CT scans of the chest abdomen and pelvis and thyroid ultrasonography shows multiple findings that will require follow-up:   (i) two 0.5 cm liver lesions   (ii) possible bladder mass, thickened sigmoid and rectum, and prominent adnexa   (iii) thyroid nodule biopsied 11/19/2018, read as [facility] 3    (iiii)  thyroid lymph node biopsied 11/19/2018 showing no evidence of carcinoma  (1) neoadjuvant chemotherapy will consist of carboplatin, docetaxel, trastuzumab, and Pertuzumab every 21 days x 6 starting 11/17/2018  (a) pertuzumab discontinued after cycle 1 due to severe diarrhea  (2) trastuzumab will be continued to complete 1 year (through March 2021).  (a) baseline echocardiogram 11/11/2018 showed an ejection fraction in the 55-60% range  (b) echocardiogram 03/10/2019 shows EF of 60-65%  (3) Right breast lumpectomy on 04/15/2019: no residual carcinoma (ypT0 ypN0)  (a) a total of 4 lymph nodes were removed  (4) adjuvant radiation to follow  (5) antiestrogens at the completion of local treatment  (6) referral to thyroid surgeon at the completion of adjuvant radiation  (7) very small liver abnormalities will require follow-up but are almost certainly benign    PLAN: Vickii Chafe is doing well today.  Sh met with Dr. Jana Hakim today and we reviewed her pathology results.  She has no evidence of residual cancer, which is great news, especially for her prognosis.  She has completed her neoadjuvant chemotherapy.  She will continue with Trastuzumab every 3 weeks to complete one year of therapy.    She will need f/u with liver MRI in 12/2019.    I placed a referral to Dr. Sondra Come for radiation oncology evaluation and consultation today.  I also let our navigators know that I requested this.  Vickii Chafe will return in 3 weeks for labs and Trastuzumab, and in 6 weeks for labs, f/u and trastuzumab.  She was recommended to continue with the appropriate pandemic precautions. She knows to call for any questions  that may arise between now and her next appointment.  We are happy to see her sooner if needed.    Wilber Bihari, NP  05/04/19 2:35 PM Medical Oncology and Hematology [facility] [address] [place], [place] [address] Tel. [phone]    Fax. [phone]    ADDENDUM: Cathie did remarkably well with her chemotherapy, especially considering that patients who have estrogen receptor positive tumors seldom achieve complete remission's.  The fact that there was no residual tumor in the breast is a very good prognostic marker for her and she has an excellent long-term prognosis.  She will continue the trastuzumab until she completes a year.  We will continue to follow echocardiography every 3 months until that is completed.  Meanwhile she will have her radiation treatments and when those are done we will discuss antiestrogens.  I personally saw this patient and performed  a substantive portion of this encounter with the listed APP documented above.   Chauncey Cruel, MD Medical Oncology and Hematology [place] [facility] [address] [place], [place] [address] Tel. [phone]    Fax. [phone]

## 2019-05-04 NOTE — Patient Instructions (Signed)
[facility] Discharge Instructions for Patients Receiving Chemotherapy  Today you received the following chemotherapy agents trastuzumab.  To help prevent nausea and vomiting after your treatment, we encourage you to take your nausea medication as directed.    If you develop nausea and vomiting that is not controlled by your nausea medication, call the clinic.   BELOW ARE SYMPTOMS THAT SHOULD BE REPORTED IMMEDIATELY:  *FEVER GREATER THAN 100.5 F  *CHILLS WITH OR WITHOUT FEVER  NAUSEA AND VOMITING THAT IS NOT CONTROLLED WITH YOUR NAUSEA MEDICATION  *UNUSUAL SHORTNESS OF BREATH  *UNUSUAL BRUISING OR BLEEDING  TENDERNESS IN MOUTH AND THROAT WITH OR WITHOUT PRESENCE OF ULCERS  *URINARY PROBLEMS  *BOWEL PROBLEMS  UNUSUAL RASH Items with * indicate a potential emergency and should be followed up as soon as possible.  Feel free to call the clinic should you have any questions or concerns. The clinic phone number is [phone].  Please show the CHEMO ALERT CARD at check-in to the Emergency Department and triage nurse.   

## 2019-05-05 ENCOUNTER — Telehealth: Payer: Self-pay | Admitting: Adult Health

## 2019-05-05 NOTE — Telephone Encounter (Signed)
I could not reach patient regarding schedule  °

## 2019-05-06 ENCOUNTER — Encounter: Payer: Self-pay | Admitting: *Deleted

## 2019-05-07 NOTE — Progress Notes (Signed)
Location of Breast Cancer: Malignant neoplasm of upper-inner quadrant of right breast in female  Histology per Pathology Report: 10/28/18:  Diagnosis 1. Breast, right, needle core biopsy, 1 o'clock, 4 cm fn - INVASIVE DUCTAL CARCINOMA 2. Lymph node, needle/core biopsy, inferior right axilla - TUMOR CELLS WITHIN FRAGMENTED NODAL TISSUE  04/15/19:  Diagnosis 1. Breast, lumpectomy, Right w/seed - INFLAMMATION AND FIBROSIS. - BIOPSY SITE AND BIOPSY CLIP. - NO RESIDUAL CARCINOMA. - MARGINS NOT INVOLVED. 2. Lymph nodes, regional resection, Right Axillary w/seed - FOUR LYMPH NODES WITH NO METASTATIC CARCINOMA, ONE WITH BIOPSY SITE AND CLIP (0/4). 3. Breast, excision, Right Medial Margin - FIBROCYSTIC CHANGES WITH CALCIFICATIONS. - NO EVIDENCE OF MALIGNANCY. - MEDIAL MARGIN CLEAR.  Receptor Status: ER(90%), PR (0%), Her2-neu (Positive, 3+), Ki-(20%)  Did patient present with symptoms (if so, please note symptoms) or was this found on screening mammography?:Debbie [name] had routine screening mammography on 10/21/2018 showing a possible abnormality in the right breast. She felt a lump in her breast, possibly dating back to 01/2018, but she didn't think anything about it due to her history with fibrous breasts; she has had fibrous breasts all her life and began to have mammograms at the age of 77.   She underwent right breast ultrasonography at [facility] on 10/28/2018 showing: On physical exam, a firm, fixed mass is palpated in the superior right breast. Targeted ultrasound is performed, showing an irregular hypoechoic mass at the 1 o'clock position 4 cm from the nipple. It measures 2.8 x 2.4 x 1.8 cm. There is associated peripheral and internal vascularity. An oval, circumscribed hypoechoic mass is also identified within the vicinity at the 1 o'clock position 4 cm from the nipple. It measures 1.0 x 0.9 x 0.4 cm. This corresponds with an additional mammographically identified mass in the  upper inner quadrant at middle depth. This is mammographically stable dating back to at least 2014. Additional stable, circumscribed masses are scattered throughout the bilateral breasts mammographically. Evaluation of the right axilla demonstrates a single, markedly abnormal lymph node with cortical thickening up to 1 cm. No additional suspicious lymphadenopathy is identified.  Accordingly on 10/28/2018 she proceeded to biopsy of the right breast area in question. The pathology from this procedure showed ([id number]): invasive ductal carcinoma, nottingham grade II of III, 2.8 cm, 1 o'clock, 4.0 cm from the nipple. Prognostic indicators significant for: estrogen receptor, 90% positive, with strong staining intensity and progesterone receptor, 0% negative. Proliferation marker Ki67 at 20%. HER2 Positive (3+) by immunohistochemistry.   An additional biopsy of the right axilla was performed on the same day ([id number]) showing:  2. Lymph node, needle/core biopsy, inferior right axilla - Tumor cells within fragmented nodal tissue   Past/Anticipated interventions by surgeon, if any: 04/15/19:  PROCEDURE:   Procedure(s): RIGHT BREAST LUMPECTOMY WITH RADIOACTIVE SEED AND  TARGETED RIGHT AXILLARY LYMPH NODE DISSECTION WITH RADIOACTIVE SEED, Injection of peri areolar area of breast with methylene blue (1.0 cc), deep sentinel lymph node biopsy  SURGEON:   Alphonsa Overall, M.D.  Past/Anticipated interventions by medical oncology, if any: Chemotherapy Per Dr. Jana Hakim 05/04/19:  PLAN: Debbie [name] is doing well today.  Sh met with Dr. Jana Hakim today and we reviewed her pathology results.  She has no evidence of residual cancer, which is great news, especially for her prognosis.  She has completed her neoadjuvant chemotherapy.  She will continue with Trastuzumab every 3 weeks to complete one year of therapy.    She will need f/u with liver MRI in  12/2019.    I placed a referral to Dr. Sondra Come for radiation oncology  evaluation and consultation today.  I also let our navigators know that I requested this.  Debbie [name] will return in 3 weeks for labs and Trastuzumab, and in 6 weeks for labs, f/u and trastuzumab.  She was recommended to continue with the appropriate pandemic precautions. She knows to call for any questions that may arise between now and her next appointment.  We are happy to see her sooner if needed.  Lymphedema issues, if any: Pt reports that she does "still have some" and sees Dr. Lucia Gaskins tomorrow.  Pain issues, if any: Pt reports continuing post-surgical pain in axilla, rated 2/10 and relieved by Tylenol.   SAFETY ISSUES:  Prior radiation? No  Pacemaker/ICD? No  Possible current pregnancy? No  Is the patient on methotrexate? No  Current Complaints / other details:  Pt presents today for consult with Dr. Sondra Come for Radiation Oncology.  BP 131/74 (BP Location: Left Arm, Patient Position: Sitting)   Pulse 74   Temp 98.2 F (36.8 C) (Temporal)   Resp 18   Ht '5\' 6"'  (1.676 m)   Wt 184 lb 2 oz (83.5 kg)   SpO2 100%   BMI 29.72 kg/m  Wt Readings from Last 3 Encounters:  05/12/19 184 lb 2 oz (83.5 kg)  05/04/19 184 lb 3.2 oz (83.6 kg)  04/15/19 187 lb 13.3 oz (85.2 kg)       Loma Sousa, RN 05/12/2019,12:56 PM

## 2019-05-12 ENCOUNTER — Encounter: Payer: Self-pay | Admitting: Radiation Oncology

## 2019-05-12 ENCOUNTER — Ambulatory Visit
Admission: RE | Admit: 2019-05-12 | Discharge: 2019-05-12 | Disposition: A | Discharge status: Home / Self Care | Payer: BC Managed Care – PPO | Source: Ambulatory Visit | Attending: Radiation Oncology | Admitting: Radiation Oncology

## 2019-05-12 ENCOUNTER — Other Ambulatory Visit: Payer: Self-pay

## 2019-05-12 VITALS — BP 131/74 | HR 74 | Temp 98.2°F | Resp 18 | Ht 66.0 in | Wt 184.1 lb

## 2019-05-12 DIAGNOSIS — C50211 Malignant neoplasm of upper-inner quadrant of right female breast: Secondary | ICD-10-CM | POA: Diagnosis not present

## 2019-05-12 DIAGNOSIS — Z170 Estrogen receptor positive status [ER+]: Secondary | ICD-10-CM | POA: Insufficient documentation

## 2019-05-12 DIAGNOSIS — Z79899 Other long term (current) drug therapy: Secondary | ICD-10-CM | POA: Diagnosis not present

## 2019-05-12 DIAGNOSIS — Z9889 Other specified postprocedural states: Secondary | ICD-10-CM | POA: Diagnosis not present

## 2019-05-12 DIAGNOSIS — C773 Secondary and unspecified malignant neoplasm of axilla and upper limb lymph nodes: Secondary | ICD-10-CM | POA: Insufficient documentation

## 2019-05-12 DIAGNOSIS — I890 Lymphedema, not elsewhere classified: Secondary | ICD-10-CM | POA: Diagnosis not present

## 2019-05-12 NOTE — Progress Notes (Signed)
Radiation Oncology         [phone] ________________________________  Name: Debbie [name] MRN: [id number]  Date: 05/12/2019  DOB: 08/21/1960  Re-Evaluation Note  CC: Donella Stade, PA-C  Wilber Bihari Cornett*    ICD-10-CM   1. Malignant neoplasm of upper-inner quadrant of right breast in female, estrogen receptor positive ([place])  C50.211    Z17.0     Diagnosis:   Stage IIA (ypT0, ypN0) Right Breast UIQ, Invasive Ductal Carcinoma, ER+ / PR- / Her2+, Grade 2  Narrative:  The patient returns today to discuss radiation treatment options. She was seen in the multidisciplinary breast clinic on 11/04/2018.   Since consultation, she underwent thyroid ultrasound on 11/06/2018, which showed suspicious characteristics to one of the nodules and and a lymph node inferior to the right thyroid lobe. Biopsies of the thyroid nodule and near by lymph node were negative for metastatic disease and considered benign.  She has been treated with carboplatin, docetaxel, and trastuzumab x6 from 11/17/2018 through 03/02/2019 under Dr. Jana Hakim.  She opted to proceed with right breast lumpectomy with sentinel lymph node biopsy (targeted axillary node dissection) on 04/15/2019. Pathology from the procedure revealed: no residual carcinoma; no metastatic lymph nodes (0/4).  On review of systems, the patient reports some residual lymphedema. She notes that she is scheduled to see Dr. Lucia Gaskins tomorrow. She also reports continued post-surgical pain to the axilla, which she rates 2/10 and is relieved with Tylenol. She denies any other symptoms.  She reports having fluid drained from the axillary scar on 2 occasions.   Allergies:  is allergic to azithromycin; erythromycin; erythromycin base; levofloxacin; penicillin g; and penicillins.  Meds: Current Outpatient Medications  Medication Sig Dispense Refill   AMBULATORY NON FORMULARY MEDICATION Continuous positive airway pressure (CPAP) machine set at autopap,  with all supplemental supplies as needed. 1 each 0   calcium-vitamin D (OSCAL WITH D) 500-200 MG-UNIT tablet Take 1 tablet by mouth daily with breakfast.     hydrochlorothiazide (HYDRODIURIL) 12.5 MG tablet TAKE 1 TABLET DAILY (Patient taking differently: No sig reported) 90 tablet 0   loratadine (CLARITIN) 10 MG tablet Take 10 mg by mouth daily.     montelukast (SINGULAIR) 10 MG tablet TAKE 1 TABLET AT BEDTIME 90 tablet 3   Multiple Vitamin (MULTIVITAMIN WITH MINERALS) TABS tablet Take 1 tablet by mouth daily.     pantoprazole (PROTONIX) 40 MG tablet TAKE 1 TABLET DAILY (Patient taking differently: Take 40 mg by mouth daily. ) 90 tablet 4   potassium chloride (MICRO-K) 10 MEQ CR capsule Take 1 capsule (10 mEq total) by mouth daily. 90 capsule 1   PROAIR HFA 108 (90 Base) MCG/ACT inhaler USE 2 INHALATIONS EVERY 4 HOURS AS NEEDED FOR WHEEZING (Patient taking differently: Inhale 2 puffs into the lungs every 4 (four) hours as needed for wheezing or shortness of breath. ) 25.5 g 1   traMADol (ULTRAM) 50 MG tablet Take 1-2 tablets (50-100 mg total) by mouth every 6 (six) hours as needed. 14 tablet 1   valACYclovir (VALTREX) 1000 MG tablet Take 1 tablet (1,000 mg total) by mouth 2 (two) times daily as needed (cold sore). TAKE 2 TABLETS EVERY 12 HOURS FOR ONE DAY, TAKE AT THE ONSET OF COLD SORE 21 tablet 1   zolpidem (AMBIEN) 5 MG tablet Take 1 tablet (5 mg total) by mouth at bedtime as needed for sleep. 20 tablet 0   HYDROcodone-acetaminophen (NORCO/VICODIN) 5-325 MG tablet Take 1 tablet by mouth  every 6 (six) hours as needed for moderate pain. (Patient not taking: Reported on 05/12/2019) 15 tablet 0   Liraglutide -Weight Management (SAXENDA) 18 MG/3ML SOPN Inject 3 mg into the skin daily. Please include ultra fine needles 76m (Patient not taking: Reported on 05/12/2019) 15 pen 1   No current facility-administered medications for this encounter.     Physical Findings: The patient is in no  acute distress. Patient is alert and oriented.  height is 5' 6" (1.676 m) and weight is 184 lb 2 oz (83.5 kg). Her temporal temperature is 98.2 F (36.8 C). Her blood pressure is 131/74 and her pulse is 74. Her respiration is 18 and oxygen saturation is 100%.  No significant changes. Lungs are clear to auscultation bilaterally. Heart has regular rate and rhythm. No palpable cervical, supraclavicular, or axillary adenopathy. Abdomen soft, non-tender, normal bowel sounds. Left Breast: large and pendulous, no palpable mass, nipple discharge or bleeding. Right Breast: well-healing scar in the periareolar area, upper-inner aspect, without signs of drainage or infection; separate scar in the low axillary region, which is also healing well without signs of drainage or infection; possible seroma underneath the axillary scar.  Lab Findings: Lab Results  Component Value Date   WBC 5.5 05/04/2019   HGB 12.6 05/04/2019   HCT 37.3 05/04/2019   MCV 93.3 05/04/2019   PLT 241 05/04/2019    Radiographic Findings: Nm Sentinel Node Inj-no Rpt (breast)  Result Date: 04/15/2019 Sulfur colloid was injected by the nuclear medicine technologist for melanoma sentinel node.   Mm Breast Surgical Specimen  Result Date: 04/15/2019 CLINICAL DATA:  Patient status post right axillary lymph node removal. EXAM: SPECIMEN RADIOGRAPH OF THE RIGHT BREAST COMPARISON:  Previous exam(s). FINDINGS: Status post excision of the right breast. The radioactive seed and biopsy marker clip are present, completely intact, and were marked for pathology. IMPRESSION: Specimen radiograph of the right breast. Electronically Signed   By: DLovey NewcomerM.D.   On: 04/15/2019 14:25   Mm Breast Surgical Specimen  Result Date: 04/15/2019 CLINICAL DATA:  Status post seed localized RIGHT lumpectomy. EXAM: SPECIMEN RADIOGRAPH OF THE RIGHT BREAST COMPARISON:  04/13/2028 and earlier FINDINGS: Status post excision of the right breast. The radioactive seed  and biopsy marker clip are present, completely intact, and were marked for pathology. IMPRESSION: Specimen radiograph of the right breast. Electronically Signed   By: ENolon NationsM.D.   On: 04/15/2019 14:20   Mm Rt Radioactive Seed Loc Mammo Guide  Result Date: 04/14/2019 CLINICAL DATA:  Patient presents for radioactive seed localization of a right breast carcinoma prior to surgical excision. Patient also had radioactive seed localization a metastatic right axillary lymph node, dictated separately. EXAM: MAMMOGRAPHIC GUIDED RADIOACTIVE SEED LOCALIZATION OF THE RIGHT BREAST COMPARISON:  Previous exam(s). FINDINGS: Patient presents for radioactive seed localization prior to surgical excision. I met with the patient and we discussed the procedure of seed localization including benefits and alternatives. We discussed the high likelihood of a successful procedure. We discussed the risks of the procedure including infection, bleeding, tissue injury and further surgery. We discussed the low dose of radioactivity involved in the procedure. Informed, written consent was given. The usual time-out protocol was performed immediately prior to the procedure. Using mammographic guidance, sterile technique, 1% lidocaine and an I-125 radioactive seed, the ribbon shaped biopsy clip was localized using a medial approach. The follow-up mammogram images confirm the seed in the expected location and were marked for Dr. NLucia Gaskins Follow-up survey of the patient  confirms presence of the radioactive seed. Order number of I-125 seed:  [id number]. Total activity:  5.038 millicuries.  Reference Date: 03/26/2019 The patient tolerated the procedure well and was released from the [place]. She was given instructions regarding seed removal. IMPRESSION: Radioactive seed localization of the right breast. No apparent complications. Electronically Signed   By: Lajean Manes M.D.   On: 04/14/2019 11:49   [place] Rt Radioactive Seed Ea Add  Lesion  Result Date: 04/14/2019 CLINICAL DATA:  Patient presents for radioactive seed localization of a metastatic right axillary lymph node prior to surgical excision. Patient also underwent mammographically guided radioactive seed localization of a right breast carcinoma, dictated separately. EXAM: ULTRASOUND GUIDED RADIOACTIVE SEED LOCALIZATION OF THE RIGHT AXILLA COMPARISON:  Previous exam(s). FINDINGS: Patient presents for radioactive seed localization prior to . I met with the patient and we discussed the procedure of seed localization including benefits and alternatives. We discussed the high likelihood of a successful procedure. We discussed the risks of the procedure including infection, bleeding, tissue injury and further surgery. We discussed the low dose of radioactivity involved in the procedure. Informed, written consent was given. The usual time-out protocol was performed immediately prior to the procedure. Using ultrasound guidance, sterile technique, 1% lidocaine and an I-125 radioactive seed, the HydroMARK biopsy clip was localized using an inferior approach. Clip was sonographically visualized deploying within the residual metastatic right axillary lymph node adjacent to the [facility] clip. Follow-up survey of the patient confirms presence of the radioactive seed. Order number of I-125 seed:  [id number]. Total activity:  1.791 millicuries.  Reference Date: 03/26/2019 The patient tolerated the procedure well and was released from the [place]. She was given instructions regarding seed removal. IMPRESSION: Radioactive seed localization of a previously biopsied metastatic right axillary lymph node. No apparent complications. Electronically Signed   By: Lajean Manes M.D.   On: 04/14/2019 11:52    Impression:  Stage IIA (ypT0, ypN0) Right Breast UIQ, Invasive Ductal Carcinoma, ER+ / PR- / Her2+, Grade 2  Patient experienced an excellent response to her neoadjuvant chemotherapy with complete  response as above.  The patient would be a good candidate for breast conservation with radiation therapy directed at the right breast. The patient was found to have a biopsy-proven axillary nodal metastasis prior to her neoadjuvant chemotherapy and would therefore recommend coverage of the axillary region at the same time as her breast radiation treatment.  Today, I reviewed the findings and work-up thus far.  We discussed the natural history of breast cancer and general treatment, highlighting the role of radiotherapy in the management.  We discussed the available radiation techniques, and focused on the details of logistics and delivery.  We reviewed the anticipated acute and late sequelae associated with radiation in this setting.  The patient was encouraged to ask questions that I answered to the best of my ability.  A patient consent form was discussed and signed.  We retained a copy for our records.  The patient would like to proceed with radiation and will be scheduled for CT simulation.  Plan:  Patient is scheduled for CT simulation on 05/20/2019 at 1 pm. Anticipate 6.5 weeks of radiation therapy.  ____________________________________ Gery Pray, MD   This document serves as a record of services personally performed by Gery Pray, MD. It was created on his behalf by Wilburn Mylar, a trained medical scribe. The creation of this record is based on the scribe's personal observations and the provider's statements to them. This  document has been checked and approved by the attending provider.

## 2019-05-12 NOTE — Patient Instructions (Signed)
Coronavirus (COVID-19) Are you at risk?  Are you at risk for the Coronavirus (COVID-19)?  To be considered HIGH RISK for Coronavirus (COVID-19), you have to meet the following criteria:  . Traveled to [place], [place], [place], [place] or [place]; or in the [place] to [place], [place], [place], or [place]; and have fever, cough, and shortness of breath within the last 2 weeks of travel OR . Been in close contact with a person diagnosed with COVID-19 within the last 2 weeks and have fever, cough, and shortness of breath . IF YOU DO NOT MEET THESE CRITERIA, YOU ARE CONSIDERED LOW RISK FOR COVID-19.  What to do if you are HIGH RISK for COVID-19?  . If you are having a medical emergency, call 911. . Seek medical care right away. Before you go to a doctor's office, urgent care or emergency department, call ahead and tell them about your recent travel, contact with someone diagnosed with COVID-19, and your symptoms. You should receive instructions from your physician's office regarding next steps of care.  . When you arrive at healthcare provider, tell the healthcare staff immediately you have returned from visiting [place], [place], [place], [place] or [place]; or traveled in the [place] to [place], [place], [place], or [place]; in the last two weeks or you have been in close contact with a person diagnosed with COVID-19 in the last 2 weeks.   . Tell the health care staff about your symptoms: fever, cough and shortness of breath. . After you have been seen by a medical provider, you will be either: o Tested for (COVID-19) and discharged home on quarantine except to seek medical care if symptoms worsen, and asked to  - Stay home and avoid contact with others until you get your results (4-5 days)  - Avoid travel on public transportation if possible (such as bus, train, or airplane) or o Sent to the Emergency Department by EMS for evaluation, COVID-19 testing, and possible  admission depending on your condition and test results.  What to do if you are LOW RISK for COVID-19?  Reduce your risk of any infection by using the same precautions used for avoiding the common cold or flu:  . Wash your hands often with soap and warm water for at least 20 seconds.  If soap and water are not readily available, use an alcohol-based hand sanitizer with at least 60% alcohol.  . If coughing or sneezing, cover your mouth and nose by coughing or sneezing into the elbow areas of your shirt or coat, into a tissue or into your sleeve (not your hands). . Avoid shaking hands with others and consider head nods or verbal greetings only. . Avoid touching your eyes, nose, or mouth with unwashed hands.  . Avoid close contact with people who are sick. . Avoid places or events with large numbers of people in one location, like concerts or sporting events. . Carefully consider travel plans you have or are making. . If you are planning any travel outside or inside the [place], visit the CDC's Travelers' Health webpage for the latest health notices. . If you have some symptoms but not all symptoms, continue to monitor at home and seek medical attention if your symptoms worsen. . If you are having a medical emergency, call 911.   ADDITIONAL HEALTHCARE OPTIONS FOR PATIENTS  [place] [facility] / e-Visit: [email or web address]         [facility]: [phone]  [facility]: [phone]                   [facility]: [phone]   

## 2019-05-20 ENCOUNTER — Ambulatory Visit
Admission: RE | Admit: 2019-05-20 | Discharge: 2019-05-20 | Disposition: A | Discharge status: Home / Self Care | Payer: BC Managed Care – PPO | Source: Ambulatory Visit | Attending: Radiation Oncology | Admitting: Radiation Oncology

## 2019-05-20 ENCOUNTER — Other Ambulatory Visit: Payer: Self-pay

## 2019-05-20 DIAGNOSIS — Z170 Estrogen receptor positive status [ER+]: Secondary | ICD-10-CM | POA: Diagnosis not present

## 2019-05-20 DIAGNOSIS — C50211 Malignant neoplasm of upper-inner quadrant of right female breast: Secondary | ICD-10-CM | POA: Insufficient documentation

## 2019-05-20 DIAGNOSIS — Z510 Encounter for antineoplastic radiation therapy: Secondary | ICD-10-CM | POA: Insufficient documentation

## 2019-05-24 ENCOUNTER — Encounter: Payer: Self-pay | Admitting: *Deleted

## 2019-05-25 ENCOUNTER — Other Ambulatory Visit: Payer: Self-pay

## 2019-05-25 ENCOUNTER — Inpatient Hospital Stay: Payer: BC Managed Care – PPO

## 2019-05-25 ENCOUNTER — Other Ambulatory Visit: Payer: Self-pay | Admitting: *Deleted

## 2019-05-25 ENCOUNTER — Inpatient Hospital Stay: Payer: BC Managed Care – PPO | Attending: Oncology

## 2019-05-25 VITALS — BP 148/80 | HR 62 | Temp 98.7°F | Resp 18

## 2019-05-25 DIAGNOSIS — C50211 Malignant neoplasm of upper-inner quadrant of right female breast: Secondary | ICD-10-CM | POA: Diagnosis not present

## 2019-05-25 DIAGNOSIS — Z23 Encounter for immunization: Secondary | ICD-10-CM | POA: Insufficient documentation

## 2019-05-25 DIAGNOSIS — Z170 Estrogen receptor positive status [ER+]: Secondary | ICD-10-CM

## 2019-05-25 DIAGNOSIS — Z79899 Other long term (current) drug therapy: Secondary | ICD-10-CM | POA: Diagnosis not present

## 2019-05-25 DIAGNOSIS — Z5112 Encounter for antineoplastic immunotherapy: Secondary | ICD-10-CM | POA: Diagnosis not present

## 2019-05-25 DIAGNOSIS — Z95828 Presence of other vascular implants and grafts: Secondary | ICD-10-CM

## 2019-05-25 LAB — CBC WITH DIFFERENTIAL/PLATELET
Abs Immature Granulocytes: 0.01 10*3/uL (ref 0.00–0.07)
Basophils Absolute: 0 10*3/uL (ref 0.0–0.1)
Basophils Relative: 0 %
Eosinophils Absolute: 0.1 10*3/uL (ref 0.0–0.5)
Eosinophils Relative: 2 %
HCT: 37.3 % (ref 36.0–46.0)
Hemoglobin: 12.8 g/dL (ref 12.0–15.0)
Immature Granulocytes: 0 %
Lymphocytes Relative: 32 %
Lymphs Abs: 1.7 10*3/uL (ref 0.7–4.0)
MCH: 30.5 pg (ref 26.0–34.0)
MCHC: 34.3 g/dL (ref 30.0–36.0)
MCV: 89 fL (ref 80.0–100.0)
Monocytes Absolute: 0.5 10*3/uL (ref 0.1–1.0)
Monocytes Relative: 9 %
Neutro Abs: 2.9 10*3/uL (ref 1.7–7.7)
Neutrophils Relative %: 57 %
Platelets: 213 10*3/uL (ref 150–400)
RBC: 4.19 MIL/uL (ref 3.87–5.11)
RDW: 11.8 % (ref 11.5–15.5)
WBC: 5.2 10*3/uL (ref 4.0–10.5)
nRBC: 0 % (ref 0.0–0.2)

## 2019-05-25 LAB — COMPREHENSIVE METABOLIC PANEL
ALT: 23 U/L (ref 0–44)
AST: 28 U/L (ref 15–41)
Albumin: 4.1 g/dL (ref 3.5–5.0)
Alkaline Phosphatase: 134 U/L — ABNORMAL HIGH (ref 38–126)
Anion gap: 10 (ref 5–15)
BUN: 11 mg/dL (ref 6–20)
CO2: 29 mmol/L (ref 22–32)
Calcium: 9.6 mg/dL (ref 8.9–10.3)
Chloride: 105 mmol/L (ref 98–111)
Creatinine, Ser: 0.71 mg/dL (ref 0.44–1.00)
GFR calc Af Amer: >60 mL/min (ref >60)
GFR calc non Af Amer: >60 mL/min (ref >60)
Glucose, Bld: 87 mg/dL (ref 70–99)
Potassium: 3.3 mmol/L — ABNORMAL LOW (ref 3.5–5.1)
Sodium: 144 mmol/L (ref 135–145)
Total Bilirubin: 0.5 mg/dL (ref 0.3–1.2)
Total Protein: 6.7 g/dL (ref 6.5–8.1)

## 2019-05-25 MED ORDER — DIPHENHYDRAMINE HCL 25 MG PO CAPS
25.0000 mg | ORAL_CAPSULE | Freq: Once | ORAL | Status: AC
  Administered 2019-05-25: 25 mg via ORAL

## 2019-05-25 MED ORDER — VALACYCLOVIR HCL 1 G PO TABS: 1000.0000 mg | ORAL_TABLET | Freq: Two times a day (BID) | ORAL | 1 refills | Status: DC | PRN

## 2019-05-25 MED ORDER — SODIUM CHLORIDE 0.9% FLUSH
10.0000 mL | Freq: Once | INTRAVENOUS | Status: AC
  Administered 2019-05-25: 13:00:00 10 mL
  Filled 2019-05-25: qty 10

## 2019-05-25 MED ORDER — SODIUM CHLORIDE 0.9 % IV SOLN
Freq: Once | INTRAVENOUS | Status: AC
  Administered 2019-05-25: 14:00:00 via INTRAVENOUS
  Filled 2019-05-25: qty 250

## 2019-05-25 MED ORDER — DIPHENHYDRAMINE HCL 25 MG PO CAPS
ORAL_CAPSULE | ORAL | Status: AC
  Filled 2019-05-25: qty 1

## 2019-05-25 MED ORDER — TRASTUZUMAB-DKST CHEMO 150 MG IV SOLR
6.0000 mg/kg | Freq: Once | INTRAVENOUS | Status: AC
  Administered 2019-05-25: 504 mg via INTRAVENOUS
  Filled 2019-05-25: qty 24

## 2019-05-25 MED ORDER — ACETAMINOPHEN 325 MG PO TABS
ORAL_TABLET | ORAL | Status: AC
  Filled 2019-05-25: qty 2

## 2019-05-25 MED ORDER — ACETAMINOPHEN 325 MG PO TABS
650.0000 mg | ORAL_TABLET | Freq: Once | ORAL | Status: AC
  Administered 2019-05-25: 650 mg via ORAL

## 2019-05-25 MED ORDER — HEPARIN SOD (PORK) LOCK FLUSH 100 UNIT/ML IV SOLN
500.0000 [IU] | Freq: Once | INTRAVENOUS | Status: AC | PRN
  Administered 2019-05-25: 16:00:00 500 [IU]
  Filled 2019-05-25: qty 5

## 2019-05-25 MED ORDER — SODIUM CHLORIDE 0.9% FLUSH
10.0000 mL | INTRAVENOUS | Status: DC | PRN
  Administered 2019-05-25: 10 mL
  Filled 2019-05-25: qty 10

## 2019-05-25 NOTE — Patient Instructions (Signed)
[facility] Discharge Instructions for Patients Receiving Chemotherapy  Today you received the following chemotherapy agents Trastuzumab (OGIVRI).  To help prevent nausea and vomiting after your treatment, we encourage you to take your nausea medication as prescribed.   If you develop nausea and vomiting that is not controlled by your nausea medication, call the clinic.   BELOW ARE SYMPTOMS THAT SHOULD BE REPORTED IMMEDIATELY:  *FEVER GREATER THAN 100.5 F  *CHILLS WITH OR WITHOUT FEVER  NAUSEA AND VOMITING THAT IS NOT CONTROLLED WITH YOUR NAUSEA MEDICATION  *UNUSUAL SHORTNESS OF BREATH  *UNUSUAL BRUISING OR BLEEDING  TENDERNESS IN MOUTH AND THROAT WITH OR WITHOUT PRESENCE OF ULCERS  *URINARY PROBLEMS  *BOWEL PROBLEMS  UNUSUAL RASH Items with * indicate a potential emergency and should be followed up as soon as possible.  Feel free to call the clinic should you have any questions or concerns. The clinic phone number is [phone].  Please show the CHEMO ALERT CARD at check-in to the Emergency Department and triage nurse.  Coronavirus (COVID-19) Are you at risk?  Are you at risk for the Coronavirus (COVID-19)?  To be considered HIGH RISK for Coronavirus (COVID-19), you have to meet the following criteria:  . Traveled to [place], [place], [place], [place] or [place]; or in the [place] to [place], [place], [place], or [place]; and have fever, cough, and shortness of breath within the last 2 weeks of travel OR . Been in close contact with a person diagnosed with COVID-19 within the last 2 weeks and have fever, cough, and shortness of breath . IF YOU DO NOT MEET THESE CRITERIA, YOU ARE CONSIDERED LOW RISK FOR COVID-19.  What to do if you are HIGH RISK for COVID-19?  . If you are having a medical emergency, call 911. . Seek medical care right away. Before you go to a doctor's office, urgent care or emergency department, call ahead and tell them  about your recent travel, contact with someone diagnosed with COVID-19, and your symptoms. You should receive instructions from your physician's office regarding next steps of care.  . When you arrive at healthcare provider, tell the healthcare staff immediately you have returned from visiting [place], [place], [place], [place] or [place]; or traveled in the [place] to [place], [place], [place], or [place]; in the last two weeks or you have been in close contact with a person diagnosed with COVID-19 in the last 2 weeks.   . Tell the health care staff about your symptoms: fever, cough and shortness of breath. . After you have been seen by a medical provider, you will be either: o Tested for (COVID-19) and discharged home on quarantine except to seek medical care if symptoms worsen, and asked to  - Stay home and avoid contact with others until you get your results (4-5 days)  - Avoid travel on public transportation if possible (such as bus, train, or airplane) or o Sent to the Emergency Department by EMS for evaluation, COVID-19 testing, and possible admission depending on your condition and test results.  What to do if you are LOW RISK for COVID-19?  Reduce your risk of any infection by using the same precautions used for avoiding the common cold or flu:  . Wash your hands often with soap and warm water for at least 20 seconds.  If soap and water are not readily available, use an alcohol-based hand sanitizer with at least 60% alcohol.  . If coughing or   sneezing, cover your mouth and nose by coughing or sneezing into the elbow areas of your shirt or coat, into a tissue or into your sleeve (not your hands). . Avoid shaking hands with others and consider head nods or verbal greetings only. . Avoid touching your eyes, nose, or mouth with unwashed hands.  . Avoid close contact with people who are sick. . Avoid places or events with large numbers of people in one location, like concerts or  sporting events. . Carefully consider travel plans you have or are making. . If you are planning any travel outside or inside the [place], visit the CDC's Travelers' Health webpage for the latest health notices. . If you have some symptoms but not all symptoms, continue to monitor at home and seek medical attention if your symptoms worsen. . If you are having a medical emergency, call 911.   [place] / e-Visit: [email or web address]         [facility]: [facility]: [phone]                   [facility]: [phone]

## 2019-05-27 ENCOUNTER — Ambulatory Visit
Admission: RE | Admit: 2019-05-27 | Discharge: 2019-05-27 | Disposition: A | Discharge status: Home / Self Care | Payer: BC Managed Care – PPO | Source: Ambulatory Visit | Attending: Radiation Oncology | Admitting: Radiation Oncology

## 2019-05-27 ENCOUNTER — Other Ambulatory Visit: Payer: Self-pay

## 2019-05-27 DIAGNOSIS — Z170 Estrogen receptor positive status [ER+]: Secondary | ICD-10-CM | POA: Diagnosis not present

## 2019-05-27 DIAGNOSIS — Z510 Encounter for antineoplastic radiation therapy: Secondary | ICD-10-CM | POA: Diagnosis not present

## 2019-05-27 DIAGNOSIS — C50211 Malignant neoplasm of upper-inner quadrant of right female breast: Secondary | ICD-10-CM

## 2019-05-27 NOTE — Progress Notes (Signed)
  Radiation Oncology         [phone] ________________________________  Name: Debbie [name] MRN: [id number]  Date: 05/27/2019  DOB: [birth year]  Simulation Verification Note    ICD-10-CM   1. Malignant neoplasm of upper-inner quadrant of right breast in female, estrogen receptor positive ([place])  C50.211    Z17.0     Status: outpatient  NARRATIVE: The patient was brought to the treatment unit and placed in the planned treatment position. The clinical setup was verified. Then port films were obtained and uploaded to the radiation oncology medical record software.  The treatment beams were carefully compared against the planned radiation fields. The position location and shape of the radiation fields was reviewed. They targeted volume of tissue appears to be appropriately covered by the radiation beams. Organs at risk appear to be excluded as planned.  Based on my personal review, I approved the simulation verification. The patient's treatment will proceed as planned.  -----------------------------------  Blair Promise, PhD, MD

## 2019-05-29 ENCOUNTER — Encounter: Payer: Self-pay | Admitting: Physician Assistant

## 2019-05-29 DIAGNOSIS — K219 Gastro-esophageal reflux disease without esophagitis: Secondary | ICD-10-CM

## 2019-05-31 ENCOUNTER — Other Ambulatory Visit: Payer: Self-pay

## 2019-05-31 ENCOUNTER — Ambulatory Visit
Admission: RE | Admit: 2019-05-31 | Discharge: 2019-05-31 | Disposition: A | Discharge status: Home / Self Care | Payer: BC Managed Care – PPO | Source: Ambulatory Visit | Attending: Radiation Oncology | Admitting: Radiation Oncology

## 2019-05-31 DIAGNOSIS — C50211 Malignant neoplasm of upper-inner quadrant of right female breast: Secondary | ICD-10-CM | POA: Diagnosis not present

## 2019-05-31 DIAGNOSIS — Z510 Encounter for antineoplastic radiation therapy: Secondary | ICD-10-CM | POA: Diagnosis not present

## 2019-05-31 DIAGNOSIS — Z170 Estrogen receptor positive status [ER+]: Secondary | ICD-10-CM | POA: Diagnosis not present

## 2019-05-31 MED ORDER — PANTOPRAZOLE SODIUM 40 MG PO TBEC: 40.0000 mg | DELAYED_RELEASE_TABLET | Freq: Every day | ORAL | 4 refills | Status: DC

## 2019-06-01 ENCOUNTER — Other Ambulatory Visit: Payer: Self-pay

## 2019-06-01 ENCOUNTER — Ambulatory Visit
Admission: RE | Admit: 2019-06-01 | Discharge: 2019-06-01 | Disposition: A | Discharge status: Home / Self Care | Payer: BC Managed Care – PPO | Source: Ambulatory Visit | Attending: Radiation Oncology | Admitting: Radiation Oncology

## 2019-06-01 DIAGNOSIS — Z510 Encounter for antineoplastic radiation therapy: Secondary | ICD-10-CM | POA: Diagnosis not present

## 2019-06-01 DIAGNOSIS — Z170 Estrogen receptor positive status [ER+]: Secondary | ICD-10-CM | POA: Diagnosis not present

## 2019-06-01 DIAGNOSIS — C50211 Malignant neoplasm of upper-inner quadrant of right female breast: Secondary | ICD-10-CM | POA: Diagnosis not present

## 2019-06-01 MED ORDER — ALRA NON-METALLIC DEODORANT (RAD-ONC)
1.0000 "application " | Freq: Once | TOPICAL | Status: AC
  Administered 2019-06-01: 1 via TOPICAL

## 2019-06-01 MED ORDER — RADIAPLEXRX EX GEL
Freq: Once | CUTANEOUS | Status: AC
  Administered 2019-06-01: 17:00:00 via TOPICAL

## 2019-06-02 ENCOUNTER — Other Ambulatory Visit: Payer: Self-pay

## 2019-06-02 ENCOUNTER — Ambulatory Visit
Admission: RE | Admit: 2019-06-02 | Discharge: 2019-06-02 | Disposition: A | Discharge status: Home / Self Care | Payer: BC Managed Care – PPO | Source: Ambulatory Visit | Attending: Radiation Oncology | Admitting: Radiation Oncology

## 2019-06-02 DIAGNOSIS — Z510 Encounter for antineoplastic radiation therapy: Secondary | ICD-10-CM | POA: Diagnosis not present

## 2019-06-02 DIAGNOSIS — C50211 Malignant neoplasm of upper-inner quadrant of right female breast: Secondary | ICD-10-CM | POA: Diagnosis not present

## 2019-06-02 DIAGNOSIS — Z170 Estrogen receptor positive status [ER+]: Secondary | ICD-10-CM | POA: Diagnosis not present

## 2019-06-03 ENCOUNTER — Ambulatory Visit
Admission: RE | Admit: 2019-06-03 | Discharge: 2019-06-03 | Disposition: A | Discharge status: Home / Self Care | Payer: BC Managed Care – PPO | Source: Ambulatory Visit | Attending: Radiation Oncology | Admitting: Radiation Oncology

## 2019-06-03 ENCOUNTER — Other Ambulatory Visit: Payer: Self-pay

## 2019-06-03 DIAGNOSIS — C50211 Malignant neoplasm of upper-inner quadrant of right female breast: Secondary | ICD-10-CM | POA: Diagnosis not present

## 2019-06-03 DIAGNOSIS — Z510 Encounter for antineoplastic radiation therapy: Secondary | ICD-10-CM | POA: Diagnosis not present

## 2019-06-03 DIAGNOSIS — Z170 Estrogen receptor positive status [ER+]: Secondary | ICD-10-CM | POA: Diagnosis not present

## 2019-06-04 ENCOUNTER — Other Ambulatory Visit: Payer: Self-pay

## 2019-06-04 ENCOUNTER — Ambulatory Visit
Admission: RE | Admit: 2019-06-04 | Discharge: 2019-06-04 | Disposition: A | Discharge status: Home / Self Care | Payer: BC Managed Care – PPO | Source: Ambulatory Visit | Attending: Radiation Oncology | Admitting: Radiation Oncology

## 2019-06-04 DIAGNOSIS — C50211 Malignant neoplasm of upper-inner quadrant of right female breast: Secondary | ICD-10-CM | POA: Diagnosis not present

## 2019-06-04 DIAGNOSIS — Z510 Encounter for antineoplastic radiation therapy: Secondary | ICD-10-CM | POA: Diagnosis not present

## 2019-06-04 DIAGNOSIS — Z170 Estrogen receptor positive status [ER+]: Secondary | ICD-10-CM | POA: Diagnosis not present

## 2019-06-06 ENCOUNTER — Other Ambulatory Visit: Payer: Self-pay | Admitting: Physician Assistant

## 2019-06-07 ENCOUNTER — Ambulatory Visit
Admission: RE | Admit: 2019-06-07 | Discharge: 2019-06-07 | Disposition: A | Discharge status: Home / Self Care | Payer: BC Managed Care – PPO | Source: Ambulatory Visit | Attending: Radiation Oncology | Admitting: Radiation Oncology

## 2019-06-07 DIAGNOSIS — Z170 Estrogen receptor positive status [ER+]: Secondary | ICD-10-CM | POA: Diagnosis not present

## 2019-06-07 DIAGNOSIS — Z510 Encounter for antineoplastic radiation therapy: Secondary | ICD-10-CM | POA: Diagnosis not present

## 2019-06-07 DIAGNOSIS — G4733 Obstructive sleep apnea (adult) (pediatric): Secondary | ICD-10-CM | POA: Diagnosis not present

## 2019-06-07 DIAGNOSIS — C50211 Malignant neoplasm of upper-inner quadrant of right female breast: Secondary | ICD-10-CM | POA: Diagnosis not present

## 2019-06-08 ENCOUNTER — Ambulatory Visit (HOSPITAL_COMMUNITY)
Admission: RE | Admit: 2019-06-08 | Discharge: 2019-06-08 | Disposition: A | Discharge status: Home / Self Care | Payer: BC Managed Care – PPO | Source: Ambulatory Visit | Attending: Adult Health | Admitting: Adult Health

## 2019-06-08 ENCOUNTER — Other Ambulatory Visit: Payer: Self-pay

## 2019-06-08 ENCOUNTER — Ambulatory Visit
Admission: RE | Admit: 2019-06-08 | Discharge: 2019-06-08 | Disposition: A | Discharge status: Home / Self Care | Payer: BC Managed Care – PPO | Source: Ambulatory Visit | Attending: Radiation Oncology | Admitting: Radiation Oncology

## 2019-06-08 DIAGNOSIS — I340 Nonrheumatic mitral (valve) insufficiency: Secondary | ICD-10-CM | POA: Insufficient documentation

## 2019-06-08 DIAGNOSIS — Z170 Estrogen receptor positive status [ER+]: Secondary | ICD-10-CM | POA: Diagnosis not present

## 2019-06-08 DIAGNOSIS — Z510 Encounter for antineoplastic radiation therapy: Secondary | ICD-10-CM | POA: Diagnosis not present

## 2019-06-08 DIAGNOSIS — C50211 Malignant neoplasm of upper-inner quadrant of right female breast: Secondary | ICD-10-CM | POA: Diagnosis not present

## 2019-06-08 NOTE — Progress Notes (Signed)
Echocardiogram 2D Echocardiogram has been performed.  Oneal Deputy Marceil Welp 06/08/2019, 10:26 AM

## 2019-06-09 ENCOUNTER — Encounter (HOSPITAL_COMMUNITY): Payer: Self-pay | Admitting: Internal Medicine

## 2019-06-09 ENCOUNTER — Ambulatory Visit (HOSPITAL_COMMUNITY)
Admission: RE | Admit: 2019-06-09 | Discharge: 2019-06-09 | Disposition: A | Discharge status: Home / Self Care | Payer: BC Managed Care – PPO | Source: Ambulatory Visit | Attending: Internal Medicine | Admitting: Internal Medicine

## 2019-06-09 ENCOUNTER — Other Ambulatory Visit: Payer: Self-pay

## 2019-06-09 ENCOUNTER — Ambulatory Visit
Admission: RE | Admit: 2019-06-09 | Discharge: 2019-06-09 | Disposition: A | Discharge status: Home / Self Care | Payer: BC Managed Care – PPO | Source: Ambulatory Visit | Attending: Radiation Oncology | Admitting: Radiation Oncology

## 2019-06-09 VITALS — BP 160/90 | HR 75 | Wt 186.4 lb

## 2019-06-09 DIAGNOSIS — J45909 Unspecified asthma, uncomplicated: Secondary | ICD-10-CM | POA: Insufficient documentation

## 2019-06-09 DIAGNOSIS — I10 Essential (primary) hypertension: Secondary | ICD-10-CM | POA: Diagnosis not present

## 2019-06-09 DIAGNOSIS — C50211 Malignant neoplasm of upper-inner quadrant of right female breast: Secondary | ICD-10-CM | POA: Insufficient documentation

## 2019-06-09 DIAGNOSIS — G4730 Sleep apnea, unspecified: Secondary | ICD-10-CM | POA: Insufficient documentation

## 2019-06-09 DIAGNOSIS — Z8249 Family history of ischemic heart disease and other diseases of the circulatory system: Secondary | ICD-10-CM | POA: Insufficient documentation

## 2019-06-09 DIAGNOSIS — I2510 Atherosclerotic heart disease of native coronary artery without angina pectoris: Secondary | ICD-10-CM

## 2019-06-09 DIAGNOSIS — Z170 Estrogen receptor positive status [ER+]: Secondary | ICD-10-CM | POA: Diagnosis not present

## 2019-06-09 DIAGNOSIS — Z510 Encounter for antineoplastic radiation therapy: Secondary | ICD-10-CM | POA: Diagnosis not present

## 2019-06-09 DIAGNOSIS — K219 Gastro-esophageal reflux disease without esophagitis: Secondary | ICD-10-CM | POA: Insufficient documentation

## 2019-06-09 DIAGNOSIS — Z79899 Other long term (current) drug therapy: Secondary | ICD-10-CM | POA: Insufficient documentation

## 2019-06-09 DIAGNOSIS — K580 Irritable bowel syndrome with diarrhea: Secondary | ICD-10-CM | POA: Insufficient documentation

## 2019-06-09 DIAGNOSIS — I2584 Coronary atherosclerosis due to calcified coronary lesion: Secondary | ICD-10-CM

## 2019-06-09 DIAGNOSIS — E669 Obesity, unspecified: Secondary | ICD-10-CM | POA: Insufficient documentation

## 2019-06-09 LAB — LIPID PANEL
Cholesterol: 185 mg/dL (ref 0–200)
HDL: 69 mg/dL (ref >40)
LDL Cholesterol: 99 mg/dL (ref 0–99)
Total CHOL/HDL Ratio: 2.7 RATIO
Triglycerides: 84 mg/dL (ref <150)
VLDL: 17 mg/dL (ref 0–40)

## 2019-06-09 LAB — COMPREHENSIVE METABOLIC PANEL
ALT: 24 U/L (ref 0–44)
AST: 27 U/L (ref 15–41)
Albumin: 3.9 g/dL (ref 3.5–5.0)
Alkaline Phosphatase: 98 U/L (ref 38–126)
Anion gap: 10 (ref 5–15)
BUN: 11 mg/dL (ref 6–20)
CO2: 28 mmol/L (ref 22–32)
Calcium: 9.4 mg/dL (ref 8.9–10.3)
Chloride: 104 mmol/L (ref 98–111)
Creatinine, Ser: 0.6 mg/dL (ref 0.44–1.00)
GFR calc Af Amer: >60 mL/min (ref >60)
GFR calc non Af Amer: >60 mL/min (ref >60)
Glucose, Bld: 93 mg/dL (ref 70–99)
Potassium: 3.2 mmol/L — ABNORMAL LOW (ref 3.5–5.1)
Sodium: 142 mmol/L (ref 135–145)
Total Bilirubin: 0.8 mg/dL (ref 0.3–1.2)
Total Protein: 6 g/dL — ABNORMAL LOW (ref 6.5–8.1)

## 2019-06-09 MED ORDER — LOSARTAN POTASSIUM 50 MG PO TABS: 50.0000 mg | ORAL_TABLET | Freq: Every day | ORAL | 6 refills | Status: DC

## 2019-06-09 NOTE — Patient Instructions (Signed)
Start Losartan 50 mg daily  Labs done today  Your physician recommends that you schedule a follow-up appointment in: 3-4 months with echocardiogram  If you have any questions or concerns before your next appointment please send [place] a message through [place] or call our office at [phone].

## 2019-06-09 NOTE — Addendum Note (Signed)
Encounter addended by: Scarlette Calico, RN on: 06/09/2019 12:41 PM  Actions taken: Visit diagnoses modified, Order list changed, Diagnosis association updated, Clinical Note Signed, Charge Capture section accepted

## 2019-06-09 NOTE — Progress Notes (Signed)
CARDIO-ONCOLOGY CLINIC CONSULT NOTE  Referring Physician: [facility]:   HPI:  Debbie [name] is a 58 y.o. female with right breast cancer referred by Dr. Jana Hakim for enrollment into the Cardio-Oncology program.   Underwent right breast biopsy in 10/28/2018 for a clinical T2 N1, stage IIA invasive ductal carcinoma, grade 2, estrogen receptor positive, progesterone receptor negative, HER-2 amplified, with an MIB-1 of 20%             (a) staging work-up with CT scans of the chest abdomen and pelvis and thyroid ultrasonography shows multiple findings that will require follow-up:                         (i) two 0.5 cm liver lesions                         (ii) possible bladder mass, thickened sigmoid and rectum, and prominent adnexa                         (iii) thyroid nodule biopsied 11/19/2018, read as [place] 3                          (iiii)  thyroid lymph node biopsied 11/19/2018 showing no evidence of carcinoma  (1) neoadjuvant chemotherapy will consist of carboplatin, docetaxel, trastuzumab, and Pertuzumab every 21 days x 6 starting 11/17/2018             (a) pertuzumab discontinued after cycle 1 due to severe diarrhea  (2) trastuzumab will be continued to complete 1 year  (3) Apr 15, 2019 lumpectomy with LN removal  (4) XRT in progress  (5) antiestrogens at the completion of local treatment  She has a h/o of obesity, HTN and anxiety. Lost 90 pound several years ago with diet and exercise. BP improved. However recently BP has gone back up. Now SBP typically 140-150 range. Has periods stress and anxiety with occasional palpitations. Has a Kardia mobile device which she uses as needed and has been told the strips are normal. Not as active as before but not stated walking again. No CP or SOB.    Echo 06/08/19: EF 60% GLS -22.3% ECHO: 03/10/19  EF 60% GLS -23.2%  Review of Systems: [y] = yes, '[ ]'  = no   General: Weight gain '[ ]' ; Weight loss '[ ]' ; Anorexia '[ ]' ; Fatigue  '[ ]' ; Fever '[ ]' ; Chills '[ ]' ; Weakness '[ ]'   Cardiac: Chest pain/pressure '[ ]' ; Resting SOB '[ ]' ; Exertional SOB '[ ]' ; Orthopnea '[ ]' ; Pedal Edema '[ ]' ; Palpitations Blue.Reese ]; Syncope '[ ]' ; Presyncope '[ ]' ; Paroxysmal nocturnal dyspnea'[ ]'   Pulmonary: Cough '[ ]' ; Wheezing'[ ]' ; Hemoptysis'[ ]' ; Sputum '[ ]' ; Snoring '[ ]'   GI: Vomiting'[ ]' ; Dysphagia'[ ]' ; Melena'[ ]' ; Hematochezia '[ ]' ; Heartburn'[ ]' ; Abdominal pain '[ ]' ; Constipation '[ ]' ; Diarrhea '[ ]' ; BRBPR '[ ]'   GU: Hematuria'[ ]' ; Dysuria '[ ]' ; Nocturia'[ ]'   Vascular: Pain in legs with walking '[ ]' ; Pain in feet with lying flat '[ ]' ; Non-healing sores '[ ]' ; Stroke '[ ]' ; TIA '[ ]' ; Slurred speech '[ ]' ;  Neuro: Headaches'[ ]' ; Vertigo'[ ]' ; Seizures'[ ]' ; Paresthesias'[ ]' ;Blurred vision '[ ]' ; Diplopia '[ ]' ; Vision changes '[ ]'   Ortho/Skin: Arthritis Blue.Reese ]; Joint pain '[ ]' ; Muscle pain '[ ]' ; Joint swelling '[ ]' ; Back Pain '[ ]' ; Rash '[ ]'   Psych:  Depression'[ ]' ; Anxiety[y ]  Heme: Bleeding problems '[ ]' ; Clotting disorders '[ ]' ; Anemia '[ ]'   Endocrine: Diabetes '[ ]' ; Thyroid dysfunction'[ ]'    Past Medical History:  Diagnosis Date  . Anxiety   . Asthma    with severe URIs  . Bronchitis   . Essential hypertension, benign 12/01/2012  . Generalized anxiety disorder 12/01/2012  . GERD (gastroesophageal reflux disease)   . Hypertension    lost over 100lbs, no BP meds now, HCTZ is for edema  . Irritable bowel syndrome (IBS)    "had for years," per patient  . PONV (postoperative nausea and vomiting)   . Seasonal allergies   . Sleep apnea    uses CPAP nightly    Current Outpatient Medications  Medication Sig Dispense Refill  . AMBULATORY NON FORMULARY MEDICATION Continuous positive airway pressure (CPAP) machine set at autopap, with all supplemental supplies as needed. 1 each 0  . calcium-vitamin D (OSCAL WITH D) 500-200 MG-UNIT tablet Take 1 tablet by mouth daily with breakfast.    . hydrochlorothiazide (HYDRODIURIL) 12.5 MG tablet Take 1 tablet (12.5 mg total) by mouth daily. NEEDS APPT 90 tablet 0  .  loratadine (CLARITIN) 10 MG tablet Take 10 mg by mouth daily as needed.     . montelukast (SINGULAIR) 10 MG tablet TAKE 1 TABLET AT BEDTIME 90 tablet 3  . Multiple Vitamin (MULTIVITAMIN WITH MINERALS) TABS tablet Take 1 tablet by mouth daily.    . pantoprazole (PROTONIX) 40 MG tablet Take 1 tablet (40 mg total) by mouth daily. 90 tablet 4  . potassium chloride (MICRO-K) 10 MEQ CR capsule Take 1 capsule (10 mEq total) by mouth daily. 90 capsule 1  . PROAIR HFA 108 (90 Base) MCG/ACT inhaler Inhale 2 puffs into the lungs every 4 (four) hours as needed for wheezing or shortness of breath.    . valACYclovir (VALTREX) 1000 MG tablet Take 1 tablet (1,000 mg total) by mouth 2 (two) times daily as needed (cold sore). TAKE 2 TABLETS EVERY 12 HOURS FOR ONE DAY, TAKE AT THE ONSET OF COLD SORE 60 tablet 1  . zolpidem (AMBIEN) 5 MG tablet Take 1 tablet (5 mg total) by mouth at bedtime as needed for sleep. 20 tablet 0   No current facility-administered medications for this encounter.     Allergies  Allergen Reactions  . Azithromycin Diarrhea  . Erythromycin Itching  . Erythromycin Base Itching  . Levofloxacin     Tendon pain  . Penicillin G Rash, Swelling and Hives  . Penicillins Swelling and Rash    Did it involve swelling of the face/tongue/throat, SOB, or low BP? Yes Did it involve sudden or severe rash/hives, skin peeling, or any reaction on the inside of your mouth or nose? No Did you need to seek medical attention at a hospital or doctor's office? Yes When did it last happen?40+ years ago If all above answers are "NO", may proceed with cephalosporin use.       Social History   Socioeconomic History  . Marital status: Married    Spouse name: [name]  . Number of children: 2  . Years of education: Not on file  . Highest education level: Not on file  Occupational History  . Not on file  Social Needs  . Financial resource strain: Not on file  . Food insecurity    Worry: Not on file     Inability: Not on file  . Transportation needs    Medical: Not on file  Non-medical: Not on file  Tobacco Use  . Smoking status: Never Smoker  . Smokeless tobacco: Never Used  Substance and Sexual Activity  . Alcohol use: Yes    Comment: rare  . Drug use: No  . Sexual activity: Not Currently  Lifestyle  . Physical activity    Days per week: Not on file    Minutes per session: Not on file  . Stress: Not on file  Relationships  . Social Herbalist on phone: Not on file    Gets together: Not on file    Attends religious service: Not on file    Active member of club or organization: Not on file    Attends meetings of clubs or organizations: Not on file    Relationship status: Not on file  . Intimate partner violence    Fear of current or ex partner: Not on file    Emotionally abused: Not on file    Physically abused: Not on file    Forced sexual activity: Not on file  Other Topics Concern  . Not on file  Social History Narrative  . Not on file      Family History  Problem Relation Age of Onset  . Hypertension Mother   . Rheum arthritis Mother   . Heart failure Mother   . Hypertension Father   . Alcoholism Father   . Colon cancer Father   . Lung cancer Father   . Depression Father   . Diabetes Father   . Skin cancer Father   . Hypertension Brother   . Stroke Brother   . Atrial fibrillation Brother   . Hyperlipidemia Maternal Grandfather   . Stroke Paternal Grandmother   . Skin cancer Paternal Grandmother   . Healthy Daughter   . Healthy Daughter   . Breast cancer Neg Hx   . Ovarian cancer Neg Hx   . Prostate cancer Neg Hx   . Pancreatic cancer Neg Hx     Vitals:   06/09/19 1131  BP: (!) 160/90  Pulse: 75  SpO2: 99%  Weight: 84.6 kg (186 lb 6.4 oz)    PHYSICAL EXAM: General:  Well appearing. No respiratory difficulty HEENT: normal Neck: supple. no JVD. Carotids 2+ bilat; no bruits. No lymphadenopathy or thryomegaly appreciated. Cor:  PMI nondisplaced. Regular rate & rhythm. 2/6 SEM at RUSB Lungs: clear Abdomen: soft, nontender, nondistended. No hepatosplenomegaly. No bruits or masses. Good bowel sounds. Extremities: no cyanosis, clubbing, rash, edema Neuro: alert & oriented x 3, cranial nerves grossly intact. moves all 4 extremities w/o difficulty. Affect pleasant.    ASSESSMENT & PLAN:  1. Breast Cancer - Explained incidence of Herceptin cardiotoxicity and role of Cardio-oncology clinic at length. Echo images reviewed personally. All parameters stable. Reviewed signs and symptoms of HF to look for. Continue Herceptin. Follow-up with echo in 3 months.  2. HTN  - BP elevated today. Restart losartan 50   3. Mild coronary calcium in LAD on chest CT - asymptomatic - recommend statin as needed to keep LDL < 100  Glori Bickers, MD  12:01 PM

## 2019-06-10 ENCOUNTER — Telehealth (HOSPITAL_COMMUNITY): Payer: Self-pay

## 2019-06-10 ENCOUNTER — Ambulatory Visit
Admission: RE | Admit: 2019-06-10 | Discharge: 2019-06-10 | Disposition: A | Discharge status: Home / Self Care | Payer: BC Managed Care – PPO | Source: Ambulatory Visit | Attending: Radiation Oncology | Admitting: Radiation Oncology

## 2019-06-10 ENCOUNTER — Other Ambulatory Visit: Payer: Self-pay

## 2019-06-10 DIAGNOSIS — Z170 Estrogen receptor positive status [ER+]: Secondary | ICD-10-CM | POA: Diagnosis not present

## 2019-06-10 DIAGNOSIS — C50211 Malignant neoplasm of upper-inner quadrant of right female breast: Secondary | ICD-10-CM | POA: Diagnosis not present

## 2019-06-10 DIAGNOSIS — Z510 Encounter for antineoplastic radiation therapy: Secondary | ICD-10-CM | POA: Diagnosis not present

## 2019-06-10 MED ORDER — POTASSIUM CHLORIDE ER 10 MEQ PO CPCR: 20.0000 meq | ORAL_CAPSULE | Freq: Every day | ORAL | 2 refills | Status: DC

## 2019-06-10 NOTE — Telephone Encounter (Signed)
Pt aware of all lab results. Advised k is low. Pt will take potassium 60 meq today.  She states she is already taking 10 meq daily.  Advised to start taking 20 meq daily.  Verbalized understanding.

## 2019-06-10 NOTE — Telephone Encounter (Signed)
-----   Message from Jolaine Artist, MD sent at 06/09/2019  6:52 PM EDT ----- Looks ok. LDL < 100. Can hold off on statin for now. K low  Start kdur 20 daily. Have her take 60 the first day

## 2019-06-11 ENCOUNTER — Other Ambulatory Visit: Payer: Self-pay

## 2019-06-11 ENCOUNTER — Ambulatory Visit
Admission: RE | Admit: 2019-06-11 | Discharge: 2019-06-11 | Disposition: A | Discharge status: Home / Self Care | Payer: BC Managed Care – PPO | Source: Ambulatory Visit | Attending: Radiation Oncology | Admitting: Radiation Oncology

## 2019-06-11 DIAGNOSIS — Z510 Encounter for antineoplastic radiation therapy: Secondary | ICD-10-CM | POA: Diagnosis not present

## 2019-06-11 DIAGNOSIS — Z170 Estrogen receptor positive status [ER+]: Secondary | ICD-10-CM | POA: Diagnosis not present

## 2019-06-11 DIAGNOSIS — C50211 Malignant neoplasm of upper-inner quadrant of right female breast: Secondary | ICD-10-CM | POA: Diagnosis not present

## 2019-06-14 ENCOUNTER — Other Ambulatory Visit: Payer: Self-pay

## 2019-06-14 ENCOUNTER — Ambulatory Visit
Admission: RE | Admit: 2019-06-14 | Discharge: 2019-06-14 | Disposition: A | Discharge status: Home / Self Care | Payer: BC Managed Care – PPO | Source: Ambulatory Visit | Attending: Radiation Oncology | Admitting: Radiation Oncology

## 2019-06-14 DIAGNOSIS — C50211 Malignant neoplasm of upper-inner quadrant of right female breast: Secondary | ICD-10-CM | POA: Diagnosis not present

## 2019-06-14 DIAGNOSIS — Z510 Encounter for antineoplastic radiation therapy: Secondary | ICD-10-CM | POA: Diagnosis not present

## 2019-06-14 DIAGNOSIS — Z170 Estrogen receptor positive status [ER+]: Secondary | ICD-10-CM | POA: Diagnosis not present

## 2019-06-14 NOTE — Progress Notes (Signed)
[facility]  [phone] [phone]    ID: ALIZZA SACRA DOB: 1961/03/08  MR#: [id number]  EKC#:[id number]  Patient Care Team: Debbie Debbie [name] as PCP - General (Family Medicine) Debbie Kaufmann, RN as Oncology Nurse Navigator Debbie Germany, RN as Oncology Nurse Navigator Debbie Overall, MD as Consulting Physician (General Surgery) , Debbie Dad, MD as Consulting Physician (Oncology) Debbie Pray, MD as Consulting Physician (Radiation Oncology) [name], Debbie Brigham, MD as Referring Physician (Ophthalmology) Debbie Dresser, MD as Consulting Physician (Cardiology) OTHER MD: Debbie Lagos, MD (Ophthalmology)   CHIEF COMPLAINT: HER-2 positive breast cancer  CURRENT TREATMENT: maintenance Trastuzumab, adjuvant radiation   INTERVAL HISTORY: Debbie Debbie [name] returns today for follow-up and treatment of her HER-2 positive breast cancer.   She is due for Trastuzumab today.  She has no side effects from that medication that she is aware of.  Since her last visit, she met with Dr. Sondra [name] on 05/12/2019, and is currently undergoing radiation treatments. She is scheduled to complete these on 07/14/2019.  She also underwent repeat echocardiogram on 06/08/2019, which showed an EF of 60%, similar to prior in 02/2019. She is scheduled for repeat echo on 09/15/2019.   REVIEW OF SYSTEMS: Debbie [name] is recovering well from her chemo.  Her fingernails are just about back to normal.  Her toenails are lagging.  She is back to work.  She is taking appropriate pandemic precautions.  She is mostly walking for exercise.  She met with cardiology and was started on losartan and her potassium was increased.  She is currently receiving radiation and so far tolerating it well.  A detailed review of systems today was otherwise stable.   HISTORY OF CURRENT ILLNESS: From the original intake note:  [place] had routine screening mammography on 10/21/2018  showing a possible abnormality in the right breast. She felt a lump in her breast, possibly dating back to 01/2018, but she didn't think anything about it due to her history with fibrous breasts; she has had fibrous breasts all her life and began to have mammograms at the age of 32.   She underwent right breast ultrasonography at [facility] on 10/28/2018 showing: On physical exam, a firm, fixed mass is palpated in the superior right breast. Targeted ultrasound is performed, showing an irregular hypoechoic mass at the 1 o'clock position 4 cm from the nipple. It measures 2.8 x 2.4 x 1.8 cm. There is associated peripheral and internal vascularity. An oval, circumscribed hypoechoic mass is also identified within the vicinity at the 1 o'clock position 4 cm from the nipple. It measures 1.0 x 0.9 x 0.4 cm. This corresponds with an additional mammographically identified mass in the upper inner quadrant at middle depth. This is mammographically stable dating back to at least 2014. Additional stable, circumscribed masses are scattered throughout the bilateral breasts mammographically. Evaluation of the right axilla demonstrates a single, markedly abnormal lymph node with cortical thickening up to 1 cm. No additional suspicious lymphadenopathy is identified.  Accordingly on 10/28/2018 she proceeded to biopsy of the right breast area in question. The pathology from this procedure showed ([id number]): invasive ductal carcinoma, nottingham grade II of III, 2.8 cm, 1 o'clock, 4.0 cm from the nipple. Prognostic indicators significant for: estrogen receptor, 90% positive, with strong staining intensity and progesterone receptor, 0% negative. Proliferation marker Ki67 at 20%. HER2 Positive (3+) by immunohistochemistry.   An additional biopsy of the right axilla was performed on the same day ([id number]) showing:  2. Lymph node, needle/core biopsy, inferior right axilla - Tumor cells within fragmented nodal tissue   The patient's subsequent history is as detailed above.   PAST MEDICAL HISTORY: Past Medical History:  Diagnosis Date  . Anxiety   . Asthma    with severe URIs  . Bronchitis   . Essential hypertension, benign 12/01/2012  . Generalized anxiety disorder 12/01/2012  . GERD (gastroesophageal reflux disease)   . Hypertension    lost over 100lbs, no BP meds now, HCTZ is for edema  . Irritable bowel syndrome (IBS)    "had for years," per patient  . PONV (postoperative nausea and vomiting)   . Seasonal allergies   . Sleep apnea    uses CPAP nightly     PAST SURGICAL HISTORY: Past Surgical History:  Procedure Laterality Date  . ABDOMINAL HYSTERECTOMY  2011   with vaginal sling  . ANKLE SURGERY    . BREAST CYST EXCISION Bilateral   . BREAST EXCISIONAL BIOPSY Bilateral   . BREAST LUMPECTOMY WITH RADIOACTIVE SEED AND AXILLARY LYMPH NODE DISSECTION Right 04/15/2019   Procedure: RIGHT BREAST LUMPECTOMY WITH RADIOACTIVE SEED AND  TARGETED RIGHT AXILLARY LYMPH NODE DISSECTION WITH RADIOACTIVE SEED;  Surgeon: Debbie Overall, MD;  Location: [facility];  Service: General;  Laterality: Right;  . CHOLECYSTECTOMY  2012  . KNEE ARTHROSCOPY W/ MEDIAL COLLATERAL LIGAMENT (MCL) REPAIR Left 10-07-2012  . PORTACATH PLACEMENT N/A 11/16/2018   Procedure: INSERTION PORT-A-CATH WITH ULTRASOUND;  Surgeon: Debbie Overall, MD;  Location: [facility];  Service: General;  Laterality: N/A;  . TONSILLECTOMY  1969     FAMILY HISTORY: Family History  Problem Relation Age of Onset  . Hypertension Debbie [name]   . Rheum arthritis Debbie [name]   . Heart failure Debbie [name]   . Hypertension Debbie [name]   . Alcoholism Debbie [name]   . Colon cancer Debbie [name]   . Lung cancer Debbie [name]   . Depression Debbie [name]   . Diabetes Debbie [name]   . Skin cancer Debbie [name]   . Hypertension Brother   . Stroke Brother   . Atrial fibrillation Brother   . Hyperlipidemia Maternal Grandfather   . Stroke Paternal Debbie [name]   . Skin cancer Paternal Debbie [name]   .  Healthy Daughter   . Healthy Daughter   . Breast cancer Neg Hx   . Ovarian cancer Neg Hx   . Prostate cancer Neg Hx   . Pancreatic cancer Neg Hx    Debbie Debbie [name] died from lung cancer at age 38. Debbie Debbie [name] died from congestive heart failure at age 75. The patient has 1 brother. Patient denies anyone in her family having breast, ovarian, prostate, or pancreatic cancer. Debbie Debbie [name] was diagnosed with skin cancer, colon cancer, and lung cancer. Debbie Debbie [name] was diagnosed with skin cancer.    GYNECOLOGIC HISTORY:  No LMP recorded. Patient has had a hysterectomy. 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 Her husband, Debbie Debbie [name], is in [employer]	[place]. They have two daughters, Debbie Debbie [name] and Debbie Debbie [name]. [address] Dawna Part is 80, lives in [place], [place], and works in Building surveyor. Adrian Blackwater Storrs is 73, lives in [place], [place], and is currently in school; he currently works part time as a Publishing copy.   ADVANCED DIRECTIVES: In the absence of any documentation, Debbie Debbie [name]'s spouse, Debbie Debbie [name],  is her healthcare power of attorney.      HEALTH MAINTENANCE: Social History   Tobacco Use  . Smoking status: Never Smoker  . Smokeless tobacco: Never Used  Substance Use Topics  . Alcohol use: Yes    Comment: rare  . Drug use: No    Colonoscopy: yes, 2015, [facility]   PAP: 2011  Bone density: no Mammography: 10/21/2018  Allergies  Allergen Reactions  . Azithromycin Diarrhea  . Erythromycin Itching  . Erythromycin Base Itching  . Levofloxacin     Tendon pain  . Penicillin G Rash, Swelling and Hives  . Penicillins Swelling and Rash    Did it involve swelling of the face/tongue/throat, SOB, or low BP? Yes Did it  involve sudden or severe rash/hives, skin peeling, or any reaction on the inside of your mouth or nose? No Did you need to seek medical attention at a hospital or doctor's office? Yes When did it last happen?40+ years ago If all above answers are "NO", may proceed with cephalosporin use.     Current Outpatient Medications  Medication Sig Dispense Refill  . AMBULATORY NON FORMULARY MEDICATION Continuous positive airway pressure (CPAP) machine set at autopap, with all supplemental supplies as needed. 1 each 0  . calcium-vitamin D (OSCAL WITH D) 500-200 MG-UNIT tablet Take 1 tablet by mouth daily with breakfast.    . hydrochlorothiazide (HYDRODIURIL) 12.5 MG tablet Take 1 tablet (12.5 mg total) by mouth daily. NEEDS APPT 90 tablet 0  . loratadine (CLARITIN) 10 MG tablet Take 10 mg by mouth daily as needed.     Marland Kitchen losartan (COZAAR) 50 MG tablet Take 1 tablet (50 mg total) by mouth daily. 30 tablet 6  . montelukast (SINGULAIR) 10 MG tablet TAKE 1 TABLET AT BEDTIME 90 tablet 3  . Multiple Vitamin (MULTIVITAMIN WITH MINERALS) TABS tablet Take 1 tablet by mouth daily.    . pantoprazole (PROTONIX) 40 MG tablet Take 1 tablet (40 mg total) by mouth daily. 90 tablet 4  . potassium chloride (MICRO-K) 10 MEQ CR capsule Take 2 capsules (20 mEq total) by mouth daily. 180 capsule 2  . PROAIR HFA 108 (90 Base) MCG/ACT inhaler Inhale 2 puffs into the lungs every 4 (four) hours as needed for wheezing or shortness of breath.    . valACYclovir (VALTREX) 1000 MG tablet Take 1 tablet (1,000 mg total) by mouth 2 (two) times daily as needed (cold sore). TAKE 2 TABLETS EVERY 12 HOURS FOR ONE DAY, TAKE AT THE ONSET OF COLD SORE 60 tablet 1  . zolpidem (AMBIEN) 5 MG tablet Take 1 tablet (5 mg total) by mouth at bedtime as needed for sleep. 20 tablet 0   No current facility-administered medications for this visit.      OBJECTIVE: Middle-aged white Debbie [name] who appears stated age 33:   06/15/19 0930  BP: 140/67   Pulse: 69  Resp: 18  Temp: 98.3 F (36.8 C)  SpO2: 100%     Body mass index is 29.41 kg/m.   Wt Readings from Last 3 Encounters:  06/15/19 182 lb 3.2 oz (82.6 kg)  06/09/19 186 lb 6.4 oz (84.6 kg)  05/12/19 184 lb 2 oz (83.5 kg)  ECOG FS:1 - Symptomatic but completely ambulatory  Sclerae unicteric, EOMs intact Wearing a mask No cervical or supraclavicular adenopathy Lungs no rales or rhonchi Heart regular rate and rhythm Abd soft, nontender, positive bowel sounds MSK no focal spinal tenderness, no upper extremity lymphedema Neuro: nonfocal, well oriented, appropriate affect Breasts:  The right breast is status post lumpectomy and is currently receiving radiation.  There is some erythema, no desquamation.  Left breast is benign.  Both axillae are benign.   LAB RESULTS:  CMP     Component Value Date/Time   NA 143 06/15/2019 0920   K 3.4 (L) 06/15/2019 0920   CL 107 06/15/2019 0920   CO2 27 06/15/2019 0920   GLUCOSE 93 06/15/2019 0920   BUN 15 06/15/2019 0920   CREATININE 0.70 06/15/2019 0920   CREATININE 0.71 01/20/2019 0752   CREATININE 0.66 07/08/2017 1127   CALCIUM 9.6 06/15/2019 0920   PROT 6.6 06/15/2019 0920   ALBUMIN 4.1 06/15/2019 0920   AST 20 06/15/2019 0920   AST 42 (H) 01/20/2019 0752   ALT 18 06/15/2019 0920   ALT 42 01/20/2019 0752   ALKPHOS 116 06/15/2019 0920   BILITOT 0.6 06/15/2019 0920   BILITOT 0.5 01/20/2019 0752   GFRNONAA >60 06/15/2019 0920   GFRNONAA >60 01/20/2019 0752   GFRNONAA 98 07/08/2017 1127   GFRAA >60 06/15/2019 0920   GFRAA >60 01/20/2019 0752   GFRAA 114 07/08/2017 1127    No results found for: TOTALPROTELP, ALBUMINELP, A1GS, A2GS, BETS, BETA2SER, GAMS, MSPIKE, SPEI  No results found for: KPAFRELGTCHN, LAMBDASER, KAPLAMBRATIO  Lab Results  Component Value Date   WBC 4.2 06/15/2019   NEUTROABS 2.8 06/15/2019   HGB 13.3 06/15/2019   HCT 39.1 06/15/2019   MCV 88.5 06/15/2019   PLT 199 06/15/2019    '@LASTCHEMISTRY' @   No results found for: LABCA2  No components found for: [id number]  No results for input(s): INR in the last 168 hours.  No results found for: LABCA2  No results found for: ZGY174  No results found for: BSW967  No results found for: RFF638  No results found for: CA2729  No components found for: HGQUANT  No results found for: CEA1 / No results found for: CEA1   No results found for: AFPTUMOR  No results found for: CHROMOGRNA  No results found for: PSA1  Appointment on 06/15/2019  Component Date Value Ref Range Status  . WBC 06/15/2019 4.2  4.0 - 10.5 K/uL Final  . RBC 06/15/2019 4.42  3.87 - 5.11 MIL/uL Final  . Hemoglobin 06/15/2019 13.3  12.0 - 15.0 g/dL Final  . HCT 06/15/2019 39.1  36.0 - 46.0 % Final  . MCV 06/15/2019 88.5  80.0 - 100.0 fL Final  . MCH 06/15/2019 30.1  26.0 - 34.0 pg Final  . MCHC 06/15/2019 34.0  30.0 - 36.0 g/dL Final  . RDW 06/15/2019 12.7  11.5 - 15.5 % Final  . Platelets 06/15/2019 199  150 - 400 K/uL Final  . nRBC 06/15/2019 0.0  0.0 - 0.2 % Final  . Neutrophils Relative % 06/15/2019 67  % Final  . Neutro Abs 06/15/2019 2.8  1.7 - 7.7 K/uL Final  . Lymphocytes Relative 06/15/2019 16  % Final  . Lymphs Abs 06/15/2019 0.7  0.7 - 4.0 K/uL Final  . Monocytes Relative 06/15/2019 11  % Final  . Monocytes Absolute 06/15/2019 0.5  0.1 - 1.0 K/uL Final  . Eosinophils Relative 06/15/2019 5  % Final  . Eosinophils Absolute 06/15/2019 0.2  0.0 - 0.5 K/uL Final  . Basophils Relative 06/15/2019 1  % Final  . Basophils Absolute 06/15/2019 0.0  0.0 - 0.1 K/uL Final  . Immature Granulocytes 06/15/2019 0  % Final  . Abs Immature Granulocytes 06/15/2019 0.01  0.00 - 0.07 K/uL Final  Performed at [facility], [place] [address] [place], [place] [address]  . Sodium 06/15/2019 143  135 - 145 mmol/L Final  . Potassium 06/15/2019 3.4* 3.5 - 5.1 mmol/L Final  . Chloride 06/15/2019 107  98 - 111 mmol/L Final  . CO2 06/15/2019 27  22 - 32  mmol/L Final  . Glucose, Bld 06/15/2019 93  70 - 99 mg/dL Final  . BUN 06/15/2019 15  6 - 20 mg/dL Final  . Creatinine, Debbie [name] 06/15/2019 0.70  0.44 - 1.00 mg/dL Final  . Calcium 06/15/2019 9.6  8.9 - 10.3 mg/dL Final  . Total Protein 06/15/2019 6.6  6.5 - 8.1 g/dL Final  . Albumin 06/15/2019 4.1  3.5 - 5.0 g/dL Final  . AST 06/15/2019 20  15 - 41 U/L Final  . ALT 06/15/2019 18  0 - 44 U/L Final  . Alkaline Phosphatase 06/15/2019 116  38 - 126 U/L Final  . Total Bilirubin 06/15/2019 0.6  0.3 - 1.2 mg/dL Final  . GFR calc non Af Amer 06/15/2019 >60  >60 mL/min Final  . GFR calc Af Amer 06/15/2019 >60  >60 mL/min Final  . Anion gap 06/15/2019 9  5 - 15 Final   Performed at [facility], [place] [address] [place], [place] [address]    (this displays the last labs from the last 3 days)  No results found for: TOTALPROTELP, ALBUMINELP, A1GS, A2GS, BETS, BETA2SER, GAMS, MSPIKE, SPEI (this displays SPEP labs)  No results found for: KPAFRELGTCHN, LAMBDASER, KAPLAMBRATIO (kappa/lambda light chains)  No results found for: HGBA, HGBA2QUANT, HGBFQUANT, HGBSQUAN (Hemoglobinopathy evaluation)   No results found for: LDH  Lab Results  Component Value Date   IRON 46 05/11/2013   TIBC 254 05/11/2013   IRONPCTSAT 18 (L) 05/11/2013   (Iron and TIBC)  Lab Results  Component Value Date   FERRITIN 72 05/11/2013    Urinalysis    Component Value Date/Time   BILIRUBINUR Small 11/09/2018 0914   PROTEINUR Negative 11/09/2018 0914   UROBILINOGEN 0.2 11/09/2018 0914   NITRITE Negative 11/09/2018 0914   LEUKOCYTESUR Negative 11/09/2018 0914     STUDIES:  No results found.   ELIGIBLE FOR AVAILABLE RESEARCH PROTOCOL: no   ASSESSMENT: 58 y.o. Debbie Debbie [name], Debbie Debbie [name] status post right breast upper inner quadrant biopsy 10/28/2018 for a clinical T2 N1, stage IIA invasive ductal carcinoma, grade 2, estrogen receptor positive, progesterone receptor negative, HER-2 amplified,  with an MIB-1 of 20%  (a) staging work-up with CT scans of the chest abdomen and pelvis and thyroid ultrasonography shows multiple findings that will require follow-up:   (i) two 0.5 cm liver lesions   (ii) possible bladder mass, thickened sigmoid and rectum, and prominent adnexa   (iii) thyroid nodule biopsied 11/19/2018, read as [facility] 3    (iiii)  thyroid lymph node biopsied 11/19/2018 showing no evidence of carcinoma  (1) neoadjuvant chemotherapy will consist of carboplatin, docetaxel, trastuzumab, and Pertuzumab every 21 days x 6 starting 11/17/2018  (a) pertuzumab discontinued after cycle 1 due to severe diarrhea  (2) trastuzumab will be continued to complete 1 year (through March 2021).  (a) baseline echocardiogram 11/11/2018 showed an ejection fraction in the 55-60% range  (b) echocardiogram 03/10/2019 shows EF of 60-65%  (c) echo 06/08/2019 shows EF 60%  (3) Right breast lumpectomy on 04/15/2019: no residual carcinoma (ypT0 ypN0)  (a) a total of 4 lymph nodes were removed  (4) adjuvant radiation 05/31/2019 - 07/14/2019  (5) antiestrogens at the  completion of local treatment  (6) referral to thyroid surgeon at the completion of adjuvant radiation  (7) very small liver abnormalities will require follow-up but are almost certainly benign    PLAN: Vickii Chafe will continue with her trastuzumab today and every 21 days through early March.  She will need a repeat echo and about 3 months and of course are cardio oncology group is managing that  We talked about her potassium which is still a little bit low even though she is taking 2 pills a day.  I suggested in addition she ate some food every day  She will receive a flu shot today  I do not think it is a good idea for her to go to an exercise class indoors.  She can do exercising outdoors.  She may have her teeth taken care of and she may have a colonoscopy later this year at her discretion  Otherwise she will see [place] again in  about 6 weeks.  She knows to call for any other issue that may develop before then.   Chauncey Cruel, MD Medical Oncology and Hematology [facility] [address] [place], [place] [address] Tel. [phone]    Fax. [phone]   I, Wilburn Mylar, am acting as scribe for Dr. Virgie [name]. .  I, Lurline Del MD, have reviewed the above documentation for accuracy and completeness, and I agree with the above.

## 2019-06-15 ENCOUNTER — Other Ambulatory Visit: Payer: Self-pay

## 2019-06-15 ENCOUNTER — Inpatient Hospital Stay: Payer: BC Managed Care – PPO

## 2019-06-15 ENCOUNTER — Ambulatory Visit
Admission: RE | Admit: 2019-06-15 | Discharge: 2019-06-15 | Disposition: A | Discharge status: Home / Self Care | Payer: BC Managed Care – PPO | Source: Ambulatory Visit | Attending: Radiation Oncology | Admitting: Radiation Oncology

## 2019-06-15 ENCOUNTER — Inpatient Hospital Stay (HOSPITAL_BASED_OUTPATIENT_CLINIC_OR_DEPARTMENT_OTHER): Payer: BC Managed Care – PPO | Admitting: Oncology

## 2019-06-15 ENCOUNTER — Telehealth: Payer: Self-pay | Admitting: Oncology

## 2019-06-15 VITALS — BP 140/67 | HR 69 | Temp 98.3°F | Resp 18 | Ht 66.0 in | Wt 182.2 lb

## 2019-06-15 DIAGNOSIS — Z170 Estrogen receptor positive status [ER+]: Secondary | ICD-10-CM

## 2019-06-15 DIAGNOSIS — Z5112 Encounter for antineoplastic immunotherapy: Secondary | ICD-10-CM | POA: Diagnosis not present

## 2019-06-15 DIAGNOSIS — Z23 Encounter for immunization: Secondary | ICD-10-CM

## 2019-06-15 DIAGNOSIS — C50211 Malignant neoplasm of upper-inner quadrant of right female breast: Secondary | ICD-10-CM

## 2019-06-15 DIAGNOSIS — Z79899 Other long term (current) drug therapy: Secondary | ICD-10-CM | POA: Diagnosis not present

## 2019-06-15 DIAGNOSIS — Z510 Encounter for antineoplastic radiation therapy: Secondary | ICD-10-CM | POA: Diagnosis not present

## 2019-06-15 DIAGNOSIS — Z95828 Presence of other vascular implants and grafts: Secondary | ICD-10-CM

## 2019-06-15 LAB — CBC WITH DIFFERENTIAL/PLATELET
Abs Immature Granulocytes: 0.01 10*3/uL (ref 0.00–0.07)
Basophils Absolute: 0 10*3/uL (ref 0.0–0.1)
Basophils Relative: 1 %
Eosinophils Absolute: 0.2 10*3/uL (ref 0.0–0.5)
Eosinophils Relative: 5 %
HCT: 39.1 % (ref 36.0–46.0)
Hemoglobin: 13.3 g/dL (ref 12.0–15.0)
Immature Granulocytes: 0 %
Lymphocytes Relative: 16 %
Lymphs Abs: 0.7 10*3/uL (ref 0.7–4.0)
MCH: 30.1 pg (ref 26.0–34.0)
MCHC: 34 g/dL (ref 30.0–36.0)
MCV: 88.5 fL (ref 80.0–100.0)
Monocytes Absolute: 0.5 10*3/uL (ref 0.1–1.0)
Monocytes Relative: 11 %
Neutro Abs: 2.8 10*3/uL (ref 1.7–7.7)
Neutrophils Relative %: 67 %
Platelets: 199 10*3/uL (ref 150–400)
RBC: 4.42 MIL/uL (ref 3.87–5.11)
RDW: 12.7 % (ref 11.5–15.5)
WBC: 4.2 10*3/uL (ref 4.0–10.5)
nRBC: 0 % (ref 0.0–0.2)

## 2019-06-15 LAB — COMPREHENSIVE METABOLIC PANEL
ALT: 18 U/L (ref 0–44)
AST: 20 U/L (ref 15–41)
Albumin: 4.1 g/dL (ref 3.5–5.0)
Alkaline Phosphatase: 116 U/L (ref 38–126)
Anion gap: 9 (ref 5–15)
BUN: 15 mg/dL (ref 6–20)
CO2: 27 mmol/L (ref 22–32)
Calcium: 9.6 mg/dL (ref 8.9–10.3)
Chloride: 107 mmol/L (ref 98–111)
Creatinine, Ser: 0.7 mg/dL (ref 0.44–1.00)
GFR calc Af Amer: >60 mL/min (ref >60)
GFR calc non Af Amer: >60 mL/min (ref >60)
Glucose, Bld: 93 mg/dL (ref 70–99)
Potassium: 3.4 mmol/L — ABNORMAL LOW (ref 3.5–5.1)
Sodium: 143 mmol/L (ref 135–145)
Total Bilirubin: 0.6 mg/dL (ref 0.3–1.2)
Total Protein: 6.6 g/dL (ref 6.5–8.1)

## 2019-06-15 MED ORDER — DIPHENHYDRAMINE HCL 25 MG PO CAPS
ORAL_CAPSULE | ORAL | Status: AC
  Filled 2019-06-15: qty 1

## 2019-06-15 MED ORDER — ACETAMINOPHEN 325 MG PO TABS
650.0000 mg | ORAL_TABLET | Freq: Once | ORAL | Status: AC
  Administered 2019-06-15: 650 mg via ORAL

## 2019-06-15 MED ORDER — SODIUM CHLORIDE 0.9% FLUSH
10.0000 mL | Freq: Once | INTRAVENOUS | Status: AC
  Administered 2019-06-15: 10 mL
  Filled 2019-06-15: qty 10

## 2019-06-15 MED ORDER — TRASTUZUMAB-DKST CHEMO 150 MG IV SOLR
6.0000 mg/kg | Freq: Once | INTRAVENOUS | Status: AC
  Administered 2019-06-15: 504 mg via INTRAVENOUS
  Filled 2019-06-15: qty 24

## 2019-06-15 MED ORDER — VALACYCLOVIR HCL 1 G PO TABS: 1000.0000 mg | ORAL_TABLET | Freq: Two times a day (BID) | ORAL | 1 refills | Status: DC | PRN

## 2019-06-15 MED ORDER — DIPHENHYDRAMINE HCL 25 MG PO CAPS
25.0000 mg | ORAL_CAPSULE | Freq: Once | ORAL | Status: AC
  Administered 2019-06-15: 25 mg via ORAL

## 2019-06-15 MED ORDER — HEPARIN SOD (PORK) LOCK FLUSH 100 UNIT/ML IV SOLN
500.0000 [IU] | Freq: Once | INTRAVENOUS | Status: AC | PRN
  Administered 2019-06-15: 500 [IU]
  Filled 2019-06-15: qty 5

## 2019-06-15 MED ORDER — INFLUENZA VAC SPLIT QUAD 0.5 ML IM SUSY
PREFILLED_SYRINGE | INTRAMUSCULAR | Status: AC
  Filled 2019-06-15: qty 0.5

## 2019-06-15 MED ORDER — SODIUM CHLORIDE 0.9% FLUSH
10.0000 mL | INTRAVENOUS | Status: DC | PRN
  Administered 2019-06-15: 10 mL
  Filled 2019-06-15: qty 10

## 2019-06-15 MED ORDER — ACETAMINOPHEN 325 MG PO TABS
ORAL_TABLET | ORAL | Status: AC
  Filled 2019-06-15: qty 2

## 2019-06-15 MED ORDER — INFLUENZA VAC SPLIT QUAD 0.5 ML IM SUSY
0.5000 mL | PREFILLED_SYRINGE | Freq: Once | INTRAMUSCULAR | Status: AC
  Administered 2019-06-15: 0.5 mL via INTRAMUSCULAR

## 2019-06-15 MED ORDER — SODIUM CHLORIDE 0.9 % IV SOLN
Freq: Once | INTRAVENOUS | Status: AC
  Administered 2019-06-15: 11:00:00 via INTRAVENOUS
  Filled 2019-06-15: qty 250

## 2019-06-15 NOTE — Patient Instructions (Signed)

## 2019-06-15 NOTE — Patient Instructions (Signed)
[facility] Discharge Instructions for Patients Receiving Chemotherapy  Today you received the following chemotherapy agents: Trastuzumab   To help prevent nausea and vomiting after your treatment, we encourage you to take your nausea medication  as prescribed.    If you develop nausea and vomiting that is not controlled by your nausea medication, call the clinic.   BELOW ARE SYMPTOMS THAT SHOULD BE REPORTED IMMEDIATELY:  *FEVER GREATER THAN 100.5 F  *CHILLS WITH OR WITHOUT FEVER  NAUSEA AND VOMITING THAT IS NOT CONTROLLED WITH YOUR NAUSEA MEDICATION  *UNUSUAL SHORTNESS OF BREATH  *UNUSUAL BRUISING OR BLEEDING  TENDERNESS IN MOUTH AND THROAT WITH OR WITHOUT PRESENCE OF ULCERS  *URINARY PROBLEMS  *BOWEL PROBLEMS  UNUSUAL RASH Items with * indicate a potential emergency and should be followed up as soon as possible.  Feel free to call the clinic should you have any questions or concerns. The clinic phone number is [phone].  Please show the CHEMO ALERT CARD at check-in to the Emergency Department and triage nurse.   

## 2019-06-15 NOTE — Telephone Encounter (Signed)
I talk with patient regarding schedule  

## 2019-06-16 ENCOUNTER — Ambulatory Visit
Admission: RE | Admit: 2019-06-16 | Discharge: 2019-06-16 | Disposition: A | Discharge status: Home / Self Care | Payer: BC Managed Care – PPO | Source: Ambulatory Visit | Attending: Radiation Oncology | Admitting: Radiation Oncology

## 2019-06-16 ENCOUNTER — Other Ambulatory Visit: Payer: Self-pay

## 2019-06-16 DIAGNOSIS — Z510 Encounter for antineoplastic radiation therapy: Secondary | ICD-10-CM | POA: Diagnosis not present

## 2019-06-16 DIAGNOSIS — Z170 Estrogen receptor positive status [ER+]: Secondary | ICD-10-CM | POA: Diagnosis not present

## 2019-06-16 DIAGNOSIS — C50211 Malignant neoplasm of upper-inner quadrant of right female breast: Secondary | ICD-10-CM | POA: Diagnosis not present

## 2019-06-17 ENCOUNTER — Other Ambulatory Visit: Payer: Self-pay

## 2019-06-17 ENCOUNTER — Ambulatory Visit
Admission: RE | Admit: 2019-06-17 | Discharge: 2019-06-17 | Disposition: A | Discharge status: Home / Self Care | Payer: BC Managed Care – PPO | Source: Ambulatory Visit | Attending: Radiation Oncology | Admitting: Radiation Oncology

## 2019-06-17 DIAGNOSIS — Z510 Encounter for antineoplastic radiation therapy: Secondary | ICD-10-CM | POA: Diagnosis not present

## 2019-06-17 DIAGNOSIS — C50211 Malignant neoplasm of upper-inner quadrant of right female breast: Secondary | ICD-10-CM | POA: Diagnosis not present

## 2019-06-17 DIAGNOSIS — Z170 Estrogen receptor positive status [ER+]: Secondary | ICD-10-CM | POA: Diagnosis not present

## 2019-06-18 ENCOUNTER — Other Ambulatory Visit: Payer: Self-pay

## 2019-06-18 ENCOUNTER — Ambulatory Visit
Admission: RE | Admit: 2019-06-18 | Discharge: 2019-06-18 | Disposition: A | Discharge status: Home / Self Care | Payer: BC Managed Care – PPO | Source: Ambulatory Visit | Attending: Radiation Oncology | Admitting: Radiation Oncology

## 2019-06-18 DIAGNOSIS — C50211 Malignant neoplasm of upper-inner quadrant of right female breast: Secondary | ICD-10-CM | POA: Diagnosis not present

## 2019-06-18 DIAGNOSIS — Z510 Encounter for antineoplastic radiation therapy: Secondary | ICD-10-CM | POA: Diagnosis not present

## 2019-06-18 DIAGNOSIS — Z170 Estrogen receptor positive status [ER+]: Secondary | ICD-10-CM | POA: Diagnosis not present

## 2019-06-21 ENCOUNTER — Ambulatory Visit
Admission: RE | Admit: 2019-06-21 | Discharge: 2019-06-21 | Disposition: A | Discharge status: Home / Self Care | Payer: BC Managed Care – PPO | Source: Ambulatory Visit | Attending: Radiation Oncology | Admitting: Radiation Oncology

## 2019-06-21 ENCOUNTER — Other Ambulatory Visit: Payer: Self-pay

## 2019-06-21 DIAGNOSIS — Z510 Encounter for antineoplastic radiation therapy: Secondary | ICD-10-CM | POA: Insufficient documentation

## 2019-06-21 DIAGNOSIS — C50211 Malignant neoplasm of upper-inner quadrant of right female breast: Secondary | ICD-10-CM | POA: Diagnosis not present

## 2019-06-21 DIAGNOSIS — Z170 Estrogen receptor positive status [ER+]: Secondary | ICD-10-CM | POA: Insufficient documentation

## 2019-06-22 ENCOUNTER — Other Ambulatory Visit: Payer: Self-pay

## 2019-06-22 ENCOUNTER — Ambulatory Visit
Admission: RE | Admit: 2019-06-22 | Discharge: 2019-06-22 | Disposition: A | Discharge status: Home / Self Care | Payer: BC Managed Care – PPO | Source: Ambulatory Visit | Attending: Radiation Oncology | Admitting: Radiation Oncology

## 2019-06-22 DIAGNOSIS — Z170 Estrogen receptor positive status [ER+]: Secondary | ICD-10-CM | POA: Diagnosis not present

## 2019-06-22 DIAGNOSIS — Z510 Encounter for antineoplastic radiation therapy: Secondary | ICD-10-CM | POA: Diagnosis not present

## 2019-06-22 DIAGNOSIS — C50211 Malignant neoplasm of upper-inner quadrant of right female breast: Secondary | ICD-10-CM | POA: Diagnosis not present

## 2019-06-23 ENCOUNTER — Other Ambulatory Visit: Payer: Self-pay

## 2019-06-23 ENCOUNTER — Ambulatory Visit
Admission: RE | Admit: 2019-06-23 | Discharge: 2019-06-23 | Disposition: A | Discharge status: Home / Self Care | Payer: BC Managed Care – PPO | Source: Ambulatory Visit | Attending: Radiation Oncology | Admitting: Radiation Oncology

## 2019-06-23 DIAGNOSIS — C50211 Malignant neoplasm of upper-inner quadrant of right female breast: Secondary | ICD-10-CM | POA: Diagnosis not present

## 2019-06-23 DIAGNOSIS — Z510 Encounter for antineoplastic radiation therapy: Secondary | ICD-10-CM | POA: Diagnosis not present

## 2019-06-23 DIAGNOSIS — Z170 Estrogen receptor positive status [ER+]: Secondary | ICD-10-CM | POA: Diagnosis not present

## 2019-06-24 ENCOUNTER — Ambulatory Visit
Admission: RE | Admit: 2019-06-24 | Discharge: 2019-06-24 | Disposition: A | Discharge status: Home / Self Care | Payer: BC Managed Care – PPO | Source: Ambulatory Visit | Attending: Radiation Oncology | Admitting: Radiation Oncology

## 2019-06-24 DIAGNOSIS — Z510 Encounter for antineoplastic radiation therapy: Secondary | ICD-10-CM | POA: Diagnosis not present

## 2019-06-24 DIAGNOSIS — Z170 Estrogen receptor positive status [ER+]: Secondary | ICD-10-CM | POA: Diagnosis not present

## 2019-06-24 DIAGNOSIS — C50211 Malignant neoplasm of upper-inner quadrant of right female breast: Secondary | ICD-10-CM | POA: Diagnosis not present

## 2019-06-25 ENCOUNTER — Other Ambulatory Visit: Payer: Self-pay

## 2019-06-25 ENCOUNTER — Ambulatory Visit
Admission: RE | Admit: 2019-06-25 | Discharge: 2019-06-25 | Disposition: A | Discharge status: Home / Self Care | Payer: BC Managed Care – PPO | Source: Ambulatory Visit | Attending: Radiation Oncology | Admitting: Radiation Oncology

## 2019-06-25 DIAGNOSIS — Z510 Encounter for antineoplastic radiation therapy: Secondary | ICD-10-CM | POA: Diagnosis not present

## 2019-06-25 DIAGNOSIS — Z170 Estrogen receptor positive status [ER+]: Secondary | ICD-10-CM | POA: Diagnosis not present

## 2019-06-25 DIAGNOSIS — C50211 Malignant neoplasm of upper-inner quadrant of right female breast: Secondary | ICD-10-CM | POA: Diagnosis not present

## 2019-06-28 ENCOUNTER — Ambulatory Visit
Admission: RE | Admit: 2019-06-28 | Discharge: 2019-06-28 | Disposition: A | Discharge status: Home / Self Care | Payer: BC Managed Care – PPO | Source: Ambulatory Visit | Attending: Radiation Oncology | Admitting: Radiation Oncology

## 2019-06-28 ENCOUNTER — Other Ambulatory Visit: Payer: Self-pay

## 2019-06-28 DIAGNOSIS — C50211 Malignant neoplasm of upper-inner quadrant of right female breast: Secondary | ICD-10-CM | POA: Diagnosis not present

## 2019-06-28 DIAGNOSIS — Z170 Estrogen receptor positive status [ER+]: Secondary | ICD-10-CM | POA: Diagnosis not present

## 2019-06-28 DIAGNOSIS — Z510 Encounter for antineoplastic radiation therapy: Secondary | ICD-10-CM | POA: Diagnosis not present

## 2019-06-29 ENCOUNTER — Ambulatory Visit
Admission: RE | Admit: 2019-06-29 | Discharge: 2019-06-29 | Disposition: A | Discharge status: Home / Self Care | Payer: BC Managed Care – PPO | Source: Ambulatory Visit | Attending: Radiation Oncology | Admitting: Radiation Oncology

## 2019-06-29 ENCOUNTER — Other Ambulatory Visit: Payer: Self-pay

## 2019-06-29 DIAGNOSIS — Z510 Encounter for antineoplastic radiation therapy: Secondary | ICD-10-CM | POA: Diagnosis not present

## 2019-06-29 DIAGNOSIS — Z170 Estrogen receptor positive status [ER+]: Secondary | ICD-10-CM | POA: Diagnosis not present

## 2019-06-29 DIAGNOSIS — C50211 Malignant neoplasm of upper-inner quadrant of right female breast: Secondary | ICD-10-CM | POA: Diagnosis not present

## 2019-06-30 ENCOUNTER — Ambulatory Visit: Payer: BC Managed Care – PPO

## 2019-07-01 ENCOUNTER — Ambulatory Visit
Admission: RE | Admit: 2019-07-01 | Discharge: 2019-07-01 | Disposition: A | Discharge status: Home / Self Care | Payer: BC Managed Care – PPO | Source: Ambulatory Visit | Attending: Radiation Oncology | Admitting: Radiation Oncology

## 2019-07-01 ENCOUNTER — Other Ambulatory Visit: Payer: Self-pay

## 2019-07-01 DIAGNOSIS — Z510 Encounter for antineoplastic radiation therapy: Secondary | ICD-10-CM | POA: Diagnosis not present

## 2019-07-01 DIAGNOSIS — Z170 Estrogen receptor positive status [ER+]: Secondary | ICD-10-CM | POA: Diagnosis not present

## 2019-07-01 DIAGNOSIS — C50211 Malignant neoplasm of upper-inner quadrant of right female breast: Secondary | ICD-10-CM | POA: Diagnosis not present

## 2019-07-02 ENCOUNTER — Other Ambulatory Visit: Payer: Self-pay | Admitting: *Deleted

## 2019-07-02 ENCOUNTER — Telehealth: Payer: Self-pay | Admitting: *Deleted

## 2019-07-02 ENCOUNTER — Ambulatory Visit
Admission: RE | Admit: 2019-07-02 | Discharge: 2019-07-02 | Disposition: A | Discharge status: Home / Self Care | Payer: BC Managed Care – PPO | Source: Ambulatory Visit | Attending: Radiation Oncology | Admitting: Radiation Oncology

## 2019-07-02 ENCOUNTER — Other Ambulatory Visit: Payer: Self-pay

## 2019-07-02 DIAGNOSIS — C50211 Malignant neoplasm of upper-inner quadrant of right female breast: Secondary | ICD-10-CM | POA: Diagnosis not present

## 2019-07-02 DIAGNOSIS — Z170 Estrogen receptor positive status [ER+]: Secondary | ICD-10-CM | POA: Diagnosis not present

## 2019-07-02 DIAGNOSIS — Z510 Encounter for antineoplastic radiation therapy: Secondary | ICD-10-CM | POA: Diagnosis not present

## 2019-07-02 MED ORDER — VALACYCLOVIR HCL 1 G PO TABS: 1000.0000 mg | ORAL_TABLET | Freq: Two times a day (BID) | ORAL | 1 refills | Status: DC | PRN

## 2019-07-02 NOTE — Telephone Encounter (Signed)
Referral faxed to [facility] - release [id number]

## 2019-07-02 NOTE — Telephone Encounter (Signed)
No entry- refill sent

## 2019-07-04 ENCOUNTER — Ambulatory Visit: Payer: BC Managed Care – PPO

## 2019-07-05 ENCOUNTER — Ambulatory Visit
Admission: RE | Admit: 2019-07-05 | Discharge: 2019-07-05 | Disposition: A | Discharge status: Home / Self Care | Payer: BC Managed Care – PPO | Source: Ambulatory Visit | Attending: Radiation Oncology | Admitting: Radiation Oncology

## 2019-07-05 ENCOUNTER — Other Ambulatory Visit: Payer: Self-pay

## 2019-07-05 DIAGNOSIS — Z170 Estrogen receptor positive status [ER+]: Secondary | ICD-10-CM | POA: Diagnosis not present

## 2019-07-05 DIAGNOSIS — C50211 Malignant neoplasm of upper-inner quadrant of right female breast: Secondary | ICD-10-CM | POA: Diagnosis not present

## 2019-07-05 DIAGNOSIS — Z510 Encounter for antineoplastic radiation therapy: Secondary | ICD-10-CM | POA: Diagnosis not present

## 2019-07-06 ENCOUNTER — Inpatient Hospital Stay: Payer: BC Managed Care – PPO

## 2019-07-06 ENCOUNTER — Telehealth: Payer: Self-pay | Admitting: *Deleted

## 2019-07-06 ENCOUNTER — Inpatient Hospital Stay: Payer: BC Managed Care – PPO | Attending: Oncology

## 2019-07-06 ENCOUNTER — Other Ambulatory Visit: Payer: Self-pay

## 2019-07-06 ENCOUNTER — Ambulatory Visit
Admission: RE | Admit: 2019-07-06 | Discharge: 2019-07-06 | Disposition: A | Discharge status: Home / Self Care | Payer: BC Managed Care – PPO | Source: Ambulatory Visit | Attending: Radiation Oncology | Admitting: Radiation Oncology

## 2019-07-06 ENCOUNTER — Ambulatory Visit: Payer: BC Managed Care – PPO | Admitting: Radiation Oncology

## 2019-07-06 VITALS — BP 120/80 | HR 67 | Temp 98.5°F | Resp 18

## 2019-07-06 DIAGNOSIS — C50211 Malignant neoplasm of upper-inner quadrant of right female breast: Secondary | ICD-10-CM

## 2019-07-06 DIAGNOSIS — Z5112 Encounter for antineoplastic immunotherapy: Secondary | ICD-10-CM | POA: Diagnosis not present

## 2019-07-06 DIAGNOSIS — Z170 Estrogen receptor positive status [ER+]: Secondary | ICD-10-CM

## 2019-07-06 DIAGNOSIS — Z510 Encounter for antineoplastic radiation therapy: Secondary | ICD-10-CM | POA: Diagnosis not present

## 2019-07-06 DIAGNOSIS — Z79899 Other long term (current) drug therapy: Secondary | ICD-10-CM | POA: Diagnosis not present

## 2019-07-06 DIAGNOSIS — Z95828 Presence of other vascular implants and grafts: Secondary | ICD-10-CM

## 2019-07-06 LAB — CBC WITH DIFFERENTIAL/PLATELET
Abs Immature Granulocytes: 0.02 10*3/uL (ref 0.00–0.07)
Basophils Absolute: 0 10*3/uL (ref 0.0–0.1)
Basophils Relative: 0 %
Eosinophils Absolute: 0.2 10*3/uL (ref 0.0–0.5)
Eosinophils Relative: 4 %
HCT: 38.9 % (ref 36.0–46.0)
Hemoglobin: 13.2 g/dL (ref 12.0–15.0)
Immature Granulocytes: 0 %
Lymphocytes Relative: 10 %
Lymphs Abs: 0.5 10*3/uL — ABNORMAL LOW (ref 0.7–4.0)
MCH: 29.9 pg (ref 26.0–34.0)
MCHC: 33.9 g/dL (ref 30.0–36.0)
MCV: 88.2 fL (ref 80.0–100.0)
Monocytes Absolute: 0.5 10*3/uL (ref 0.1–1.0)
Monocytes Relative: 10 %
Neutro Abs: 3.7 10*3/uL (ref 1.7–7.7)
Neutrophils Relative %: 76 %
Platelets: 183 10*3/uL (ref 150–400)
RBC: 4.41 MIL/uL (ref 3.87–5.11)
RDW: 13.2 % (ref 11.5–15.5)
WBC: 5 10*3/uL (ref 4.0–10.5)
nRBC: 0 % (ref 0.0–0.2)

## 2019-07-06 LAB — COMPREHENSIVE METABOLIC PANEL
ALT: 25 U/L (ref 0–44)
AST: 26 U/L (ref 15–41)
Albumin: 4.2 g/dL (ref 3.5–5.0)
Alkaline Phosphatase: 110 U/L (ref 38–126)
Anion gap: 12 (ref 5–15)
BUN: 12 mg/dL (ref 6–20)
CO2: 29 mmol/L (ref 22–32)
Calcium: 9.6 mg/dL (ref 8.9–10.3)
Chloride: 104 mmol/L (ref 98–111)
Creatinine, Ser: 0.73 mg/dL (ref 0.44–1.00)
GFR calc Af Amer: >60 mL/min (ref >60)
GFR calc non Af Amer: >60 mL/min (ref >60)
Glucose, Bld: 89 mg/dL (ref 70–99)
Potassium: 3.6 mmol/L (ref 3.5–5.1)
Sodium: 145 mmol/L (ref 135–145)
Total Bilirubin: 0.8 mg/dL (ref 0.3–1.2)
Total Protein: 6.5 g/dL (ref 6.5–8.1)

## 2019-07-06 MED ORDER — VALACYCLOVIR HCL 1 G PO TABS: 1000.0000 mg | ORAL_TABLET | Freq: Two times a day (BID) | ORAL | 1 refills | Status: DC | PRN

## 2019-07-06 MED ORDER — DIPHENHYDRAMINE HCL 25 MG PO CAPS
25.0000 mg | ORAL_CAPSULE | Freq: Once | ORAL | Status: AC
  Administered 2019-07-06: 14:00:00 25 mg via ORAL

## 2019-07-06 MED ORDER — DIPHENHYDRAMINE HCL 25 MG PO CAPS
ORAL_CAPSULE | ORAL | Status: AC
  Filled 2019-07-06: qty 1

## 2019-07-06 MED ORDER — HEPARIN SOD (PORK) LOCK FLUSH 100 UNIT/ML IV SOLN
500.0000 [IU] | Freq: Once | INTRAVENOUS | Status: AC | PRN
  Administered 2019-07-06: 15:00:00 500 [IU]
  Filled 2019-07-06: qty 5

## 2019-07-06 MED ORDER — SONAFINE EX EMUL
1.0000 "application " | Freq: Two times a day (BID) | CUTANEOUS | Status: DC
  Administered 2019-07-06: 1 via TOPICAL

## 2019-07-06 MED ORDER — TRASTUZUMAB-DKST CHEMO 150 MG IV SOLR
6.0000 mg/kg | Freq: Once | INTRAVENOUS | Status: AC
  Administered 2019-07-06: 14:00:00 504 mg via INTRAVENOUS
  Filled 2019-07-06: qty 24

## 2019-07-06 MED ORDER — ACETAMINOPHEN 325 MG PO TABS
ORAL_TABLET | ORAL | Status: AC
  Filled 2019-07-06: qty 2

## 2019-07-06 MED ORDER — SODIUM CHLORIDE 0.9% FLUSH
10.0000 mL | INTRAVENOUS | Status: DC | PRN
  Administered 2019-07-06: 15:00:00 10 mL
  Filled 2019-07-06: qty 10

## 2019-07-06 MED ORDER — ACETAMINOPHEN 325 MG PO TABS
650.0000 mg | ORAL_TABLET | Freq: Once | ORAL | Status: AC
  Administered 2019-07-06: 14:00:00 650 mg via ORAL

## 2019-07-06 MED ORDER — SODIUM CHLORIDE 0.9 % IV SOLN
Freq: Once | INTRAVENOUS | Status: AC
  Administered 2019-07-06: 14:00:00 via INTRAVENOUS
  Filled 2019-07-06: qty 250

## 2019-07-06 MED ORDER — SODIUM CHLORIDE 0.9% FLUSH
10.0000 mL | Freq: Once | INTRAVENOUS | Status: AC
  Administered 2019-07-06: 13:00:00 10 mL
  Filled 2019-07-06: qty 10

## 2019-07-06 NOTE — Patient Instructions (Signed)
[facility] Discharge Instructions for Patients Receiving Chemotherapy  Today you received the following chemotherapy agents Trastuzumab-dkst  To help prevent nausea and vomiting after your treatment, we encourage you to take your nausea medication as directed.    If you develop nausea and vomiting that is not controlled by your nausea medication, call the clinic.   BELOW ARE SYMPTOMS THAT SHOULD BE REPORTED IMMEDIATELY:  *FEVER GREATER THAN 100.5 F  *CHILLS WITH OR WITHOUT FEVER  NAUSEA AND VOMITING THAT IS NOT CONTROLLED WITH YOUR NAUSEA MEDICATION  *UNUSUAL SHORTNESS OF BREATH  *UNUSUAL BRUISING OR BLEEDING  TENDERNESS IN MOUTH AND THROAT WITH OR WITHOUT PRESENCE OF ULCERS  *URINARY PROBLEMS  *BOWEL PROBLEMS  UNUSUAL RASH Items with * indicate a potential emergency and should be followed up as soon as possible.  Feel free to call the clinic should you have any questions or concerns. The clinic phone number is [phone].  Please show the [facility] at check-in to the Emergency Department and triage nurse.

## 2019-07-06 NOTE — Telephone Encounter (Signed)
This RN sent a new prescription for Valcyclovir per clarification of dosing instructions.  This RN also placed a referral for GYN at [facility] per pt request- notified HIM for sending of records.

## 2019-07-07 ENCOUNTER — Other Ambulatory Visit: Payer: Self-pay

## 2019-07-07 ENCOUNTER — Ambulatory Visit
Admission: RE | Admit: 2019-07-07 | Discharge: 2019-07-07 | Disposition: A | Discharge status: Home / Self Care | Payer: BC Managed Care – PPO | Source: Ambulatory Visit | Attending: Radiation Oncology | Admitting: Radiation Oncology

## 2019-07-07 DIAGNOSIS — C50211 Malignant neoplasm of upper-inner quadrant of right female breast: Secondary | ICD-10-CM

## 2019-07-07 DIAGNOSIS — Z170 Estrogen receptor positive status [ER+]: Secondary | ICD-10-CM | POA: Diagnosis not present

## 2019-07-07 DIAGNOSIS — Z510 Encounter for antineoplastic radiation therapy: Secondary | ICD-10-CM | POA: Diagnosis not present

## 2019-07-07 NOTE — Progress Notes (Signed)
.  Simulation verification  The patient was brought to the treatment machine and placed in the plan treatment position.  Clinical set up was verified to ensure that the target region is appropriately covered for the patient's upcoming electron boost treatment.  The targeted volume of tissue is appropriately covered by the radiation field.  Based on my personal review, I approve the simulation verification.  The patient's treatment will proceed as planned.  ------------------------------------------------  -----------------------------------  Analie Katzman D. Karthik Whittinghill, PhD, MD  

## 2019-07-08 ENCOUNTER — Other Ambulatory Visit: Payer: Self-pay

## 2019-07-08 ENCOUNTER — Ambulatory Visit
Admission: RE | Admit: 2019-07-08 | Discharge: 2019-07-08 | Disposition: A | Discharge status: Home / Self Care | Payer: BC Managed Care – PPO | Source: Ambulatory Visit | Attending: Radiation Oncology | Admitting: Radiation Oncology

## 2019-07-08 ENCOUNTER — Ambulatory Visit: Payer: BC Managed Care – PPO

## 2019-07-08 ENCOUNTER — Other Ambulatory Visit: Payer: Self-pay | Admitting: *Deleted

## 2019-07-08 DIAGNOSIS — Z170 Estrogen receptor positive status [ER+]: Secondary | ICD-10-CM | POA: Diagnosis not present

## 2019-07-08 DIAGNOSIS — K219 Gastro-esophageal reflux disease without esophagitis: Secondary | ICD-10-CM

## 2019-07-08 DIAGNOSIS — C50211 Malignant neoplasm of upper-inner quadrant of right female breast: Secondary | ICD-10-CM | POA: Diagnosis not present

## 2019-07-08 DIAGNOSIS — Z510 Encounter for antineoplastic radiation therapy: Secondary | ICD-10-CM | POA: Diagnosis not present

## 2019-07-08 MED ORDER — VALACYCLOVIR HCL 1 G PO TABS: 1000.0000 mg | ORAL_TABLET | Freq: Two times a day (BID) | ORAL | 1 refills | Status: DC | PRN

## 2019-07-09 ENCOUNTER — Ambulatory Visit: Payer: BC Managed Care – PPO

## 2019-07-09 ENCOUNTER — Other Ambulatory Visit: Payer: Self-pay

## 2019-07-09 ENCOUNTER — Ambulatory Visit
Admission: RE | Admit: 2019-07-09 | Discharge: 2019-07-09 | Disposition: A | Discharge status: Home / Self Care | Payer: BC Managed Care – PPO | Source: Ambulatory Visit | Attending: Radiation Oncology | Admitting: Radiation Oncology

## 2019-07-09 DIAGNOSIS — Z170 Estrogen receptor positive status [ER+]: Secondary | ICD-10-CM | POA: Diagnosis not present

## 2019-07-09 DIAGNOSIS — C50211 Malignant neoplasm of upper-inner quadrant of right female breast: Secondary | ICD-10-CM | POA: Diagnosis not present

## 2019-07-09 DIAGNOSIS — Z510 Encounter for antineoplastic radiation therapy: Secondary | ICD-10-CM | POA: Diagnosis not present

## 2019-07-11 ENCOUNTER — Ambulatory Visit
Admission: RE | Admit: 2019-07-11 | Discharge: 2019-07-11 | Disposition: A | Discharge status: Home / Self Care | Payer: BC Managed Care – PPO | Source: Ambulatory Visit | Attending: Radiation Oncology | Admitting: Radiation Oncology

## 2019-07-11 ENCOUNTER — Ambulatory Visit: Payer: BC Managed Care – PPO

## 2019-07-11 ENCOUNTER — Other Ambulatory Visit: Payer: Self-pay

## 2019-07-11 DIAGNOSIS — C50211 Malignant neoplasm of upper-inner quadrant of right female breast: Secondary | ICD-10-CM | POA: Diagnosis not present

## 2019-07-11 DIAGNOSIS — Z170 Estrogen receptor positive status [ER+]: Secondary | ICD-10-CM | POA: Diagnosis not present

## 2019-07-11 DIAGNOSIS — Z510 Encounter for antineoplastic radiation therapy: Secondary | ICD-10-CM | POA: Diagnosis not present

## 2019-07-12 ENCOUNTER — Other Ambulatory Visit: Payer: Self-pay

## 2019-07-12 ENCOUNTER — Ambulatory Visit
Admission: RE | Admit: 2019-07-12 | Discharge: 2019-07-12 | Disposition: A | Discharge status: Home / Self Care | Payer: BC Managed Care – PPO | Source: Ambulatory Visit | Attending: Radiation Oncology | Admitting: Radiation Oncology

## 2019-07-12 ENCOUNTER — Ambulatory Visit: Payer: BC Managed Care – PPO

## 2019-07-12 DIAGNOSIS — C50211 Malignant neoplasm of upper-inner quadrant of right female breast: Secondary | ICD-10-CM | POA: Diagnosis not present

## 2019-07-12 DIAGNOSIS — Z510 Encounter for antineoplastic radiation therapy: Secondary | ICD-10-CM | POA: Diagnosis not present

## 2019-07-12 DIAGNOSIS — Z170 Estrogen receptor positive status [ER+]: Secondary | ICD-10-CM | POA: Diagnosis not present

## 2019-07-13 ENCOUNTER — Other Ambulatory Visit: Payer: Self-pay

## 2019-07-13 ENCOUNTER — Ambulatory Visit
Admission: RE | Admit: 2019-07-13 | Discharge: 2019-07-13 | Disposition: A | Discharge status: Home / Self Care | Payer: BC Managed Care – PPO | Source: Ambulatory Visit | Attending: Radiation Oncology | Admitting: Radiation Oncology

## 2019-07-13 DIAGNOSIS — Z170 Estrogen receptor positive status [ER+]: Secondary | ICD-10-CM | POA: Diagnosis not present

## 2019-07-13 DIAGNOSIS — C50211 Malignant neoplasm of upper-inner quadrant of right female breast: Secondary | ICD-10-CM

## 2019-07-13 DIAGNOSIS — Z510 Encounter for antineoplastic radiation therapy: Secondary | ICD-10-CM | POA: Diagnosis not present

## 2019-07-13 MED ORDER — SONAFINE EX EMUL
1.0000 "application " | Freq: Two times a day (BID) | CUTANEOUS | Status: DC
  Administered 2019-07-13: 1 via TOPICAL

## 2019-07-14 ENCOUNTER — Ambulatory Visit
Admission: RE | Admit: 2019-07-14 | Discharge: 2019-07-14 | Disposition: A | Discharge status: Home / Self Care | Payer: BC Managed Care – PPO | Source: Ambulatory Visit | Attending: Radiation Oncology | Admitting: Radiation Oncology

## 2019-07-14 ENCOUNTER — Encounter: Payer: Self-pay | Admitting: Radiation Oncology

## 2019-07-14 ENCOUNTER — Other Ambulatory Visit: Payer: Self-pay

## 2019-07-14 DIAGNOSIS — C50211 Malignant neoplasm of upper-inner quadrant of right female breast: Secondary | ICD-10-CM | POA: Diagnosis not present

## 2019-07-14 DIAGNOSIS — Z170 Estrogen receptor positive status [ER+]: Secondary | ICD-10-CM | POA: Diagnosis not present

## 2019-07-14 DIAGNOSIS — Z510 Encounter for antineoplastic radiation therapy: Secondary | ICD-10-CM | POA: Diagnosis not present

## 2019-07-15 ENCOUNTER — Ambulatory Visit: Payer: BC Managed Care – PPO

## 2019-07-21 NOTE — Progress Notes (Incomplete)
Patient Name: Debbie [name] MRN: [id number] DOB: 01/11/1961 Referring Physician: Charlestine Massed (Profile Not Attached) Date of Service: 07/14/2019 [facility], [place]                                                        End Of Treatment Note  Diagnoses: C50.211-Malignant neoplasm of upper-inner quadrant of right female breast  Cancer Staging: Stage IIA (ypT0, ypN0) Right Breast UIQ, Invasive Ductal Carcinoma, ER+ / PR- / Her2+, Grade 2  Intent: Curative  Radiation Treatment Dates: 05/31/2019 through 07/14/2019 Site Technique Total Dose (Gy) Dose per Fx (Gy) Completed Fx Beam Energies  Breast: Breast_Rt 3D 50.4/50.4 1.8 28/28 6X, 10X  Breast: Breast_Rt_axilla 3D 45/45 1.8 25/25 6X, 10X   Narrative: The patient tolerated radiation therapy relatively well. Patient reported mild fatigue and right nipple/breast pain throughout treatment. On examinations, right breast had some mild erythema and hyperpigmentation changes throughout treatment. No skin breakdown, signs of infection, or swelling were noted.  Plan: The patient will follow-up with radiation oncology in one month.  ________________________________________________   Blair Promise, PhD, MD  This document serves as a record of services personally performed by Gery Pray, MD. It was created on his behalf by Clerance Lav, a trained medical scribe. The creation of this record is based on the scribe's personal observations and the provider's statements to them. This document has been checked and approved by the attending provider.

## 2019-07-26 ENCOUNTER — Other Ambulatory Visit (HOSPITAL_COMMUNITY): Payer: Self-pay

## 2019-07-26 MED ORDER — LOSARTAN POTASSIUM 50 MG PO TABS: 50.0000 mg | ORAL_TABLET | Freq: Every day | ORAL | 6 refills | Status: DC

## 2019-07-27 ENCOUNTER — Inpatient Hospital Stay: Payer: BC Managed Care – PPO | Attending: Oncology

## 2019-07-27 ENCOUNTER — Encounter: Payer: Self-pay | Admitting: Adult Health

## 2019-07-27 ENCOUNTER — Inpatient Hospital Stay: Payer: BC Managed Care – PPO

## 2019-07-27 ENCOUNTER — Inpatient Hospital Stay (HOSPITAL_BASED_OUTPATIENT_CLINIC_OR_DEPARTMENT_OTHER): Payer: BC Managed Care – PPO | Admitting: Adult Health

## 2019-07-27 ENCOUNTER — Other Ambulatory Visit: Payer: Self-pay

## 2019-07-27 VITALS — BP 120/68 | HR 66 | Temp 97.6°F | Resp 18 | Ht 66.0 in | Wt 184.3 lb

## 2019-07-27 DIAGNOSIS — C50211 Malignant neoplasm of upper-inner quadrant of right female breast: Secondary | ICD-10-CM

## 2019-07-27 DIAGNOSIS — Z95828 Presence of other vascular implants and grafts: Secondary | ICD-10-CM

## 2019-07-27 DIAGNOSIS — Z170 Estrogen receptor positive status [ER+]: Secondary | ICD-10-CM | POA: Insufficient documentation

## 2019-07-27 DIAGNOSIS — Z79899 Other long term (current) drug therapy: Secondary | ICD-10-CM | POA: Diagnosis not present

## 2019-07-27 DIAGNOSIS — Z5112 Encounter for antineoplastic immunotherapy: Secondary | ICD-10-CM | POA: Insufficient documentation

## 2019-07-27 LAB — COMPREHENSIVE METABOLIC PANEL
ALT: 23 U/L (ref 0–44)
AST: 25 U/L (ref 15–41)
Albumin: 4.1 g/dL (ref 3.5–5.0)
Alkaline Phosphatase: 109 U/L (ref 38–126)
Anion gap: 10 (ref 5–15)
BUN: 15 mg/dL (ref 6–20)
CO2: 27 mmol/L (ref 22–32)
Calcium: 9.3 mg/dL (ref 8.9–10.3)
Chloride: 106 mmol/L (ref 98–111)
Creatinine, Ser: 0.7 mg/dL (ref 0.44–1.00)
GFR calc Af Amer: >60 mL/min (ref >60)
GFR calc non Af Amer: >60 mL/min (ref >60)
Glucose, Bld: 88 mg/dL (ref 70–99)
Potassium: 3.4 mmol/L — ABNORMAL LOW (ref 3.5–5.1)
Sodium: 143 mmol/L (ref 135–145)
Total Bilirubin: 0.9 mg/dL (ref 0.3–1.2)
Total Protein: 6.5 g/dL (ref 6.5–8.1)

## 2019-07-27 LAB — CBC WITH DIFFERENTIAL/PLATELET
Abs Immature Granulocytes: 0 10*3/uL (ref 0.00–0.07)
Basophils Absolute: 0 10*3/uL (ref 0.0–0.1)
Basophils Relative: 1 %
Eosinophils Absolute: 0.3 10*3/uL (ref 0.0–0.5)
Eosinophils Relative: 7 %
HCT: 36.8 % (ref 36.0–46.0)
Hemoglobin: 12.8 g/dL (ref 12.0–15.0)
Immature Granulocytes: 0 %
Lymphocytes Relative: 14 %
Lymphs Abs: 0.6 10*3/uL — ABNORMAL LOW (ref 0.7–4.0)
MCH: 30.5 pg (ref 26.0–34.0)
MCHC: 34.8 g/dL (ref 30.0–36.0)
MCV: 87.8 fL (ref 80.0–100.0)
Monocytes Absolute: 0.5 10*3/uL (ref 0.1–1.0)
Monocytes Relative: 12 %
Neutro Abs: 2.5 10*3/uL (ref 1.7–7.7)
Neutrophils Relative %: 66 %
Platelets: 199 10*3/uL (ref 150–400)
RBC: 4.19 MIL/uL (ref 3.87–5.11)
RDW: 13.9 % (ref 11.5–15.5)
WBC: 3.8 10*3/uL — ABNORMAL LOW (ref 4.0–10.5)
nRBC: 0 % (ref 0.0–0.2)

## 2019-07-27 MED ORDER — SODIUM CHLORIDE 0.9% FLUSH
10.0000 mL | Freq: Once | INTRAVENOUS | Status: AC
  Administered 2019-07-27: 12:00:00 10 mL
  Filled 2019-07-27: qty 10

## 2019-07-27 MED ORDER — DIPHENHYDRAMINE HCL 25 MG PO CAPS
ORAL_CAPSULE | ORAL | Status: AC
  Filled 2019-07-27: qty 1

## 2019-07-27 MED ORDER — SODIUM CHLORIDE 0.9 % IV SOLN
Freq: Once | INTRAVENOUS | Status: AC
  Administered 2019-07-27: 14:00:00 via INTRAVENOUS
  Filled 2019-07-27: qty 250

## 2019-07-27 MED ORDER — ACETAMINOPHEN 325 MG PO TABS
ORAL_TABLET | ORAL | Status: AC
  Filled 2019-07-27: qty 2

## 2019-07-27 MED ORDER — ACETAMINOPHEN 325 MG PO TABS
650.0000 mg | ORAL_TABLET | Freq: Once | ORAL | Status: AC
  Administered 2019-07-27: 650 mg via ORAL

## 2019-07-27 MED ORDER — HEPARIN SOD (PORK) LOCK FLUSH 100 UNIT/ML IV SOLN
500.0000 [IU] | Freq: Once | INTRAVENOUS | Status: AC | PRN
  Administered 2019-07-27: 500 [IU]
  Filled 2019-07-27: qty 5

## 2019-07-27 MED ORDER — TRASTUZUMAB-DKST CHEMO 150 MG IV SOLR
6.0000 mg/kg | Freq: Once | INTRAVENOUS | Status: AC
  Administered 2019-07-27: 15:00:00 504 mg via INTRAVENOUS
  Filled 2019-07-27: qty 24

## 2019-07-27 MED ORDER — SODIUM CHLORIDE 0.9% FLUSH
10.0000 mL | INTRAVENOUS | Status: DC | PRN
  Administered 2019-07-27: 10 mL
  Filled 2019-07-27: qty 10

## 2019-07-27 MED ORDER — DIPHENHYDRAMINE HCL 25 MG PO CAPS
25.0000 mg | ORAL_CAPSULE | Freq: Once | ORAL | Status: AC
  Administered 2019-07-27: 25 mg via ORAL

## 2019-07-27 NOTE — Patient Instructions (Signed)
Anastrozole tablets What is this medicine? ANASTROZOLE (an AS troe zole) is used to treat breast cancer in women who have gone through menopause. Some types of breast cancer depend on estrogen to grow, and this medicine can stop tumor growth by blocking estrogen production. This medicine may be used for other purposes; ask your health care provider or pharmacist if you have questions. COMMON BRAND NAME(S): Arimidex What should I tell my health care provider before I take this medicine? They need to know if you have any of these conditions:  bone problems  heart disease  high cholesterol  an unusual or allergic reaction to anastrozole, other medicines, foods, dyes, or preservatives  pregnant or trying to get pregnant  breast-feeding How should I use this medicine? Take this medicine by mouth with a glass of water. Follow the directions on the prescription label. You can take it with or without food. If it upsets your stomach, take it with food. Take your medicine at regular intervals. Do not take it more often than directed. Do not stop taking except on your doctor's advice. Talk to your pediatrician regarding the use of this medicine in children. Special care may be needed. Overdosage: If you think you have taken too much of this medicine contact a poison control center or emergency room at once. NOTE: This medicine is only for you. Do not share this medicine with others. What if I miss a dose? If you miss a dose, take it as soon as you can. If it is almost time for your next dose, take only that dose. Do not take double or extra doses. What may interact with this medicine? This medicine may interact with the following medications:  female hormones, like estrogens or progestins and birth control pills, patches, rings, or injections  tamoxifen This list may not describe all possible interactions. Give your health care provider a list of all the medicines, herbs, non-prescription drugs,  or dietary supplements you use. Also tell them if you smoke, drink alcohol, or use illegal drugs. Some items may interact with your medicine. What should I watch for while using this medicine? Visit your doctor or health care professional for regular checks on your progress. Let your doctor or health care professional know about any unusual vaginal bleeding. Do not become pregnant while taking this medicine or for at least 3 weeks after stopping it. Women should inform their doctor if they wish to become pregnant or think they might be pregnant. There is a potential for serious side effects to an unborn child. Talk to your health care professional or pharmacist for more information. Do not breast-feed an infant while taking this medicine or for 2 weeks after stopping it. This medicine may interfere with the ability to have a child. Talk with your doctor or health care professional if you are concerned about your fertility. Using this medicine for a long time may increase your risk of low bone mass. Talk to your doctor about bone health. You should make sure that you get enough calcium and vitamin D while you are taking this medicine. Discuss the foods you eat and the vitamins you take with your health care professional. What side effects may I notice from receiving this medicine? Side effects that you should report to your doctor or health care professional as soon as possible:  allergic reactions like skin rash, itching or hives, swelling of the face, lips, or tongue  signs and symptoms of a blood clot such as breathing problems;   changes in vision; chest pain; sudden headache; pain, swelling, warmth in the leg; trouble speaking; sudden numbness or weakness of the face, arm, or leg  signs and symptoms of infection like fever or chills; cough; sore throat; pain or trouble passing urine Side effects that usually do not require medical attention (report to your doctor or health care professional if they  continue or are bothersome):  bone pain  dizziness  hair loss  headache  hot flashes  joint pain  muscle pain  signs of decreased red blood cells - unusually weak or tired, feeling faint or lightheaded, falls  vaginal discharge, itching, or odor in women This list may not describe all possible side effects. Call your doctor for medical advice about side effects. You may report side effects to FDA at 1-800-FDA-1088. Where should I keep my medicine? Keep out of the reach of children. Store at room temperature between 20 and 25 degrees C (68 and 77 degrees F). Throw away any unused medicine after the expiration date. NOTE: This sheet is a summary. It may not cover all possible information. If you have questions about this medicine, talk to your doctor, pharmacist, or health care provider.  2020 [employer] Standard (2017-08-18 14:56:51) Tamoxifen oral tablet What is this medicine? TAMOXIFEN (ta MOX i fen) blocks the effects of estrogen. It is commonly used to treat breast cancer. It is also used to decrease the chance of breast cancer coming back in women who have received treatment for the disease. It may also help prevent breast cancer in women who have a high risk of developing breast cancer. This medicine may be used for other purposes; ask your health care provider or pharmacist if you have questions. COMMON BRAND NAME(S): Nolvadex What should I tell my health care provider before I take this medicine? They need to know if you have any of these conditions:  blood clots  blood disease  cataracts or impaired eyesight  endometriosis  high calcium levels  high cholesterol  irregular menstrual cycles  liver disease  stroke  uterine fibroids  an unusual reaction to tamoxifen, other medicines, foods, dyes, or preservatives  pregnant or trying to get pregnant  breast-feeding How should I use this medicine? Take this medicine by mouth with a glass of water.  Follow the directions on the prescription label. You can take it with or without food. Take your medicine at regular intervals. Do not take your medicine more often than directed. Do not stop taking except on your doctor's advice. A special MedGuide will be given to you by the pharmacist with each prescription and refill. Be sure to read this information carefully each time. Talk to your pediatrician regarding the use of this medicine in children. While this drug may be prescribed for selected conditions, precautions do apply. Overdosage: If you think you have taken too much of this medicine contact a poison control center or emergency room at once. NOTE: This medicine is only for you. Do not share this medicine with others. What if I miss a dose? If you miss a dose, take it as soon as you can. If it is almost time for your next dose, take only that dose. Do not take double or extra doses. What may interact with this medicine? Do not take this medicine with any of the following medications:  cisapride  certain medicines for irregular heart beat like dronedarone, quinidine  certain medicines for fungal infection like fluconazole, posaconazole  pimozide  saquinavir  thioridazine This medicine  may also interact with the following medications:  aminoglutethimide  anastrozole  bromocriptine  chemotherapy drugs  dofetilide  female hormones, like estrogens and birth control pills  letrozole  medroxyprogesterone  phenobarbital  rifampin  warfarin This list may not describe all possible interactions. Give your health care provider a list of all the medicines, herbs, non-prescription drugs, or dietary supplements you use. Also tell them if you smoke, drink alcohol, or use illegal drugs. Some items may interact with your medicine. What should I watch for while using this medicine? Visit your doctor or health care professional for regular checks on your progress. You will need  regular pelvic exams, breast exams, and mammograms. If you are taking this medicine to reduce your risk of getting breast cancer, you should know that this medicine does not prevent all types of breast cancer. If breast cancer or other problems occur, there is no guarantee that it will be found at an early stage. Do not become pregnant while taking this medicine or for 2 months after stopping it. Women should inform their doctor if they wish to become pregnant or think they might be pregnant. There is a potential for serious side effects to an unborn child. Talk to your health care professional or pharmacist for more information. Do not breast-feed an infant while taking this medicine or for 3 months after stopping it. This medicine may interfere with the ability to have a child. Talk with your doctor or health care professional if you are concerned about your fertility. What side effects may I notice from receiving this medicine? Side effects that you should report to your doctor or health care professional as soon as possible:  allergic reactions like skin rash, itching or hives, swelling of the face, lips, or tongue  changes in vision  changes in your menstrual cycle  difficulty walking or talking  new breast lumps  numbness  pelvic pain or pressure  redness, blistering, peeling or loosening of the skin, including inside the mouth  signs and symptoms of a dangerous change in heartbeat or heart rhythm like chest pain, dizziness, fast or irregular heartbeat, palpitations, feeling faint or lightheaded, falls, breathing problems  sudden chest pain  swelling, pain or tenderness in your calf or leg  unusual bruising or bleeding  vaginal discharge that is bloody, brown, or rust  weakness  yellowing of the whites of the eyes or skin Side effects that usually do not require medical attention (report to your doctor or health care professional if they continue or are  bothersome):  fatigue  hair loss, although uncommon and is usually mild  headache  hot flashes  impotence (in men)  nausea, vomiting (mild)  vaginal discharge (white or clear) This list may not describe all possible side effects. Call your doctor for medical advice about side effects. You may report side effects to FDA at 1-800-FDA-1088. Where should I keep my medicine? Keep out of the reach of children. Store at room temperature between 20 and 25 degrees C (68 and 77 degrees F). Protect from light. Keep container tightly closed. Throw away any unused medicine after the expiration date. NOTE: This sheet is a summary. It may not cover all possible information. If you have questions about this medicine, talk to your doctor, pharmacist, or health care provider.  2020 [employer] Standard (2018-07-28 11:15:31)

## 2019-07-27 NOTE — Progress Notes (Signed)
[place]  [phone] [phone]    ID: Debbie [name] DOB: 09-Sep-1960  MR#: [id number]  YHC#:[id number]  Patient Care Team: Lavada Mesi as PCP - General (Family Medicine) Mauro Kaufmann, RN as Oncology Nurse Navigator Rockwell Germany, RN as Oncology Nurse Navigator Alphonsa Overall, MD as Consulting Physician (General Surgery) Magrinat, Virgie Dad, MD as Consulting Physician (Oncology) Gery Pray, MD as Consulting Physician (Radiation Oncology) Burden, Lincoln Brigham, MD as Referring Physician (Ophthalmology) Larey Dresser, MD as Consulting Physician (Cardiology) OTHER MD: Earl Lagos, MD (Ophthalmology)   CHIEF COMPLAINT: HER-2 positive breast cancer  CURRENT TREATMENT: maintenance Trastuzumab, adjuvant radiation   INTERVAL HISTORY: [name] returns today for follow-up and treatment of her HER-2 positive breast cancer.   She is due for Trastuzumab today.  She has no side effects from that medication that she is aware of.  She has completed adjuvant radiation.  She notes her skin did well and she is happy to be finished, because 33 times coming up here is a lot.  She has mild underarm pain, and wonders if she is regaining sensation.  She also underwent repeat echocardiogram on 06/08/2019, which showed an EF of 60%, similar to prior in 02/2019. She is scheduled for repeat echo on 09/15/2019.   REVIEW OF SYSTEMS: [name] is s/p hysterectomy in 2012.  She still has her ovaries.  She notes that she developed night sweats.  She has not undergone bone density testing.  She denies any new issues today.  [name] is without fever, chills, chest pain, palpitations, cough, shortness of breath, headaches, nausea, vomiting, bowel/bladder changes, swelling, dizziness, or any other concerns.  A detailed ROS was otherwise non contributory.     HISTORY OF CURRENT ILLNESS: From the original intake note:  Debbie [name] had routine  screening mammography on 10/21/2018 showing a possible abnormality in the right breast. She felt a lump in her breast, possibly dating back to 01/2018, but she didn't think anything about it due to her history with fibrous breasts; she has had fibrous breasts all her life and began to have mammograms at the age of 18.   She underwent right breast ultrasonography at [facility] on 10/28/2018 showing: On physical exam, a firm, fixed mass is palpated in the superior right breast. Targeted ultrasound is performed, showing an irregular hypoechoic mass at the 1 o'clock position 4 cm from the nipple. It measures 2.8 x 2.4 x 1.8 cm. There is associated peripheral and internal vascularity. An oval, circumscribed hypoechoic mass is also identified within the vicinity at the 1 o'clock position 4 cm from the nipple. It measures 1.0 x 0.9 x 0.4 cm. This corresponds with an additional mammographically identified mass in the upper inner quadrant at middle depth. This is mammographically stable dating back to at least 2014. Additional stable, circumscribed masses are scattered throughout the bilateral breasts mammographically. Evaluation of the right axilla demonstrates a single, markedly abnormal lymph node with cortical thickening up to 1 cm. No additional suspicious lymphadenopathy is identified.  Accordingly on 10/28/2018 she proceeded to biopsy of the right breast area in question. The pathology from this procedure showed ([id number]): invasive ductal carcinoma, nottingham grade II of III, 2.8 cm, 1 o'clock, 4.0 cm from the nipple. Prognostic indicators significant for: estrogen receptor, 90% positive, with strong staining intensity and progesterone receptor, 0% negative. Proliferation marker Ki67 at 20%. HER2 Positive (3+) by immunohistochemistry.   An additional biopsy of the right axilla was  performed on the same day ([id number]) showing:  2. Lymph node, needle/core biopsy, inferior right axilla - Tumor cells  within fragmented nodal tissue  The patient's subsequent history is as detailed above.   PAST MEDICAL HISTORY: Past Medical History:  Diagnosis Date  . Anxiety   . Asthma    with severe URIs  . Bronchitis   . Essential hypertension, benign 12/01/2012  . Generalized anxiety disorder 12/01/2012  . GERD (gastroesophageal reflux disease)   . Hypertension    lost over 100lbs, no BP meds now, HCTZ is for edema  . Irritable bowel syndrome (IBS)    "had for years," per patient  . PONV (postoperative nausea and vomiting)   . Seasonal allergies   . Sleep apnea    uses CPAP nightly     PAST SURGICAL HISTORY: Past Surgical History:  Procedure Laterality Date  . ABDOMINAL HYSTERECTOMY  2011   with vaginal sling  . ANKLE SURGERY    . BREAST CYST EXCISION Bilateral   . BREAST EXCISIONAL BIOPSY Bilateral   . BREAST LUMPECTOMY WITH RADIOACTIVE SEED AND AXILLARY LYMPH NODE DISSECTION Right 04/15/2019   Procedure: RIGHT BREAST LUMPECTOMY WITH RADIOACTIVE SEED AND  TARGETED RIGHT AXILLARY LYMPH NODE DISSECTION WITH RADIOACTIVE SEED;  Surgeon: Alphonsa Overall, MD;  Location: [facility];  Service: General;  Laterality: Right;  . CHOLECYSTECTOMY  2012  . KNEE ARTHROSCOPY W/ MEDIAL COLLATERAL LIGAMENT (MCL) REPAIR Left 10-07-2012  . PORTACATH PLACEMENT N/A 11/16/2018   Procedure: INSERTION PORT-A-CATH WITH ULTRASOUND;  Surgeon: Alphonsa Overall, MD;  Location: [facility];  Service: General;  Laterality: N/A;  . TONSILLECTOMY  1969     FAMILY HISTORY: Family History  Problem Relation Age of Onset  . Hypertension Mother   . Rheum arthritis Mother   . Heart failure Mother   . Hypertension Father   . Alcoholism Father   . Colon cancer Father   . Lung cancer Father   . Depression Father   . Diabetes Father   . Skin cancer Father   . Hypertension Brother   . Stroke Brother   . Atrial fibrillation Brother   . Hyperlipidemia Maternal Grandfather   . Stroke Paternal Grandmother   . Skin  cancer Paternal Grandmother   . Healthy Daughter   . Healthy Daughter   . Breast cancer Neg Hx   . Ovarian cancer Neg Hx   . Prostate cancer Neg Hx   . Pancreatic cancer Neg Hx    [name]'s father died from lung cancer at age 80. Patients' mother died from congestive heart failure at age 74. The patient has 1 brother. Patient denies anyone in her family having breast, ovarian, prostate, or pancreatic cancer. Lateesha's father was diagnosed with skin cancer, colon cancer, and lung cancer. [name]'s paternal grandmother was diagnosed with skin cancer.    GYNECOLOGIC HISTORY:  No LMP recorded. Patient has had a hysterectomy. 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 Her husband, Ena Demary, is in [employer]	[place]. They have two daughters, Kerri Perches and [place]. [address] Dawna Part is 17, lives in [place], [place], and works in Building surveyor. Adrian Blackwater Wright is 67, lives in [place], [place], and is currently in school; he currently works part time as a Publishing copy.   ADVANCED DIRECTIVES: In  the absence of any documentation, Zaineb's spouse, [name], is her healthcare power of attorney.      HEALTH MAINTENANCE: Social History   Tobacco Use  . Smoking status: Never Smoker  . Smokeless tobacco: Never Used  Substance Use Topics  . Alcohol use: Yes    Comment: rare  . Drug use: No    Colonoscopy: yes, 2015, [facility]   PAP: 2011  Bone density: no Mammography: 10/21/2018  Allergies  Allergen Reactions  . Azithromycin Diarrhea  . Erythromycin Itching  . Erythromycin Base Itching  . Levofloxacin     Tendon pain  . Penicillin G Rash, Swelling and Hives  . Penicillins Swelling and Rash    Did it involve swelling of the  face/tongue/throat, SOB, or low BP? Yes Did it involve sudden or severe rash/hives, skin peeling, or any reaction on the inside of your mouth or nose? No Did you need to seek medical attention at a hospital or doctor's office? Yes When did it last happen?40+ years ago If all above answers are "NO", may proceed with cephalosporin use.     Current Outpatient Medications  Medication Sig Dispense Refill  . AMBULATORY NON FORMULARY MEDICATION Continuous positive airway pressure (CPAP) machine set at autopap, with all supplemental supplies as needed. 1 each 0  . calcium-vitamin D (OSCAL WITH D) 500-200 MG-UNIT tablet Take 1 tablet by mouth daily with breakfast.    . hydrochlorothiazide (HYDRODIURIL) 12.5 MG tablet Take 1 tablet (12.5 mg total) by mouth daily. NEEDS APPT 90 tablet 0  . loratadine (CLARITIN) 10 MG tablet Take 10 mg by mouth daily as needed.     Marland Kitchen losartan (COZAAR) 50 MG tablet Take 1 tablet (50 mg total) by mouth daily. 30 tablet 6  . montelukast (SINGULAIR) 10 MG tablet TAKE 1 TABLET AT BEDTIME 90 tablet 3  . Multiple Vitamin (MULTIVITAMIN WITH MINERALS) TABS tablet Take 1 tablet by mouth daily.    . pantoprazole (PROTONIX) 40 MG tablet Take 1 tablet (40 mg total) by mouth daily. 90 tablet 4  . potassium chloride (MICRO-K) 10 MEQ CR capsule Take 2 capsules (20 mEq total) by mouth daily. 180 capsule 2  . PROAIR HFA 108 (90 Base) MCG/ACT inhaler Inhale 2 puffs into the lungs every 4 (four) hours as needed for wheezing or shortness of breath.    . valACYclovir (VALTREX) 1000 MG tablet Take 1 tablet (1,000 mg total) by mouth 2 (two) times daily as needed (cold sore). 60 tablet 1  . zolpidem (AMBIEN) 5 MG tablet Take 1 tablet (5 mg total) by mouth at bedtime as needed for sleep. 20 tablet 0   No current facility-administered medications for this visit.      OBJECTIVE: Middle-aged white woman who appears stated age 1:   07/27/19 1314  BP: 120/68  Pulse: 66  Resp: 18   Temp: 97.6 F (36.4 C)  SpO2: 100%     Body mass index is 29.75 kg/m.   Wt Readings from Last 3 Encounters:  07/27/19 184 lb 4.8 oz (83.6 kg)  06/15/19 182 lb 3.2 oz (82.6 kg)  06/09/19 186 lb 6.4 oz (84.6 kg)  ECOG FS:1 - Symptomatic but completely ambulatory GENERAL: Patient is a well appearing female in no acute distress HEENT:  Sclerae anicteric.  Oropharynx clear and moist. No ulcerations or evidence of oropharyngeal candidiasis. Neck is supple.  NODES:  No cervical, supraclavicular, or axillary lymphadenopathy palpated.  BREAST EXAM:  Right breast inspected only.  Erythema and swelling noted  to right breast.  Skin intact LUNGS:  Clear to auscultation bilaterally.  No wheezes or rhonchi. HEART:  Regular rate and rhythm. No murmur appreciated. ABDOMEN:  Soft, nontender.  Positive, normoactive bowel sounds. No organomegaly palpated. MSK:  No focal spinal tenderness to palpation. Full range of motion bilaterally in the upper extremities. EXTREMITIES:  No peripheral edema.   SKIN:  Clear with no obvious rashes or skin changes. No nail dyscrasia. NEURO:  Nonfocal. Well oriented.  Appropriate affect.     LAB RESULTS:  CMP     Component Value Date/Time   NA 143 07/27/2019 1232   K 3.4 (L) 07/27/2019 1232   CL 106 07/27/2019 1232   CO2 27 07/27/2019 1232   GLUCOSE 88 07/27/2019 1232   BUN 15 07/27/2019 1232   CREATININE 0.70 07/27/2019 1232   CREATININE 0.71 01/20/2019 0752   CREATININE 0.66 07/08/2017 1127   CALCIUM 9.3 07/27/2019 1232   PROT 6.5 07/27/2019 1232   ALBUMIN 4.1 07/27/2019 1232   AST 25 07/27/2019 1232   AST 42 (H) 01/20/2019 0752   ALT 23 07/27/2019 1232   ALT 42 01/20/2019 0752   ALKPHOS 109 07/27/2019 1232   BILITOT 0.9 07/27/2019 1232   BILITOT 0.5 01/20/2019 0752   GFRNONAA >60 07/27/2019 1232   GFRNONAA >60 01/20/2019 0752   GFRNONAA 98 07/08/2017 1127   GFRAA >60 07/27/2019 1232   GFRAA >60 01/20/2019 0752   GFRAA 114 07/08/2017 1127    No  results found for: TOTALPROTELP, ALBUMINELP, A1GS, A2GS, BETS, BETA2SER, GAMS, MSPIKE, SPEI  No results found for: KPAFRELGTCHN, LAMBDASER, KAPLAMBRATIO  Lab Results  Component Value Date   WBC 3.8 (L) 07/27/2019   NEUTROABS 2.5 07/27/2019   HGB 12.8 07/27/2019   HCT 36.8 07/27/2019   MCV 87.8 07/27/2019   PLT 199 07/27/2019    _0 @  No results found for: LABCA2  No components found for: [id number]  No results for input(s): INR in the last 168 hours.  No results found for: LABCA2  No results found for: JGG836  No results found for: OQH476  No results found for: LYY503  No results found for: CA2729  No components found for: HGQUANT  No results found for: CEA1 / No results found for: CEA1   No results found for: AFPTUMOR  No results found for: CHROMOGRNA  No results found for: PSA1  Appointment on 07/27/2019  Component Date Value Ref Range Status  . Sodium 07/27/2019 143  135 - 145 mmol/L Final  . Potassium 07/27/2019 3.4* 3.5 - 5.1 mmol/L Final  . Chloride 07/27/2019 106  98 - 111 mmol/L Final  . CO2 07/27/2019 27  22 - 32 mmol/L Final  . Glucose, Bld 07/27/2019 88  70 - 99 mg/dL Final  . BUN 07/27/2019 15  6 - 20 mg/dL Final  . Creatinine, Ser 07/27/2019 0.70  0.44 - 1.00 mg/dL Final  . Calcium 07/27/2019 9.3  8.9 - 10.3 mg/dL Final  . Total Protein 07/27/2019 6.5  6.5 - 8.1 g/dL Final  . Albumin 07/27/2019 4.1  3.5 - 5.0 g/dL Final  . AST 07/27/2019 25  15 - 41 U/L Final  . ALT 07/27/2019 23  0 - 44 U/L Final  . Alkaline Phosphatase 07/27/2019 109  38 - 126 U/L Final  . Total Bilirubin 07/27/2019 0.9  0.3 - 1.2 mg/dL Final  . GFR calc non Af Amer 07/27/2019 >60  >60 mL/min Final  . GFR calc Af Amer 07/27/2019 >60  >60 mL/min  Final  . Anion gap 07/27/2019 10  5 - 15 Final   Performed at [facility], [place] [address], [place], [place] [address]  . WBC 07/27/2019 3.8* 4.0 - 10.5 K/uL Final  . RBC 07/27/2019 4.19  3.87 -  5.11 MIL/uL Final  . Hemoglobin 07/27/2019 12.8  12.0 - 15.0 g/dL Final  . HCT 07/27/2019 36.8  36.0 - 46.0 % Final  . MCV 07/27/2019 87.8  80.0 - 100.0 fL Final  . MCH 07/27/2019 30.5  26.0 - 34.0 pg Final  . MCHC 07/27/2019 34.8  30.0 - 36.0 g/dL Final  . RDW 07/27/2019 13.9  11.5 - 15.5 % Final  . Platelets 07/27/2019 199  150 - 400 K/uL Final  . nRBC 07/27/2019 0.0  0.0 - 0.2 % Final  . Neutrophils Relative % 07/27/2019 66  % Final  . Neutro Abs 07/27/2019 2.5  1.7 - 7.7 K/uL Final  . Lymphocytes Relative 07/27/2019 14  % Final  . Lymphs Abs 07/27/2019 0.6* 0.7 - 4.0 K/uL Final  . Monocytes Relative 07/27/2019 12  % Final  . Monocytes Absolute 07/27/2019 0.5  0.1 - 1.0 K/uL Final  . Eosinophils Relative 07/27/2019 7  % Final  . Eosinophils Absolute 07/27/2019 0.3  0.0 - 0.5 K/uL Final  . Basophils Relative 07/27/2019 1  % Final  . Basophils Absolute 07/27/2019 0.0  0.0 - 0.1 K/uL Final  . Immature Granulocytes 07/27/2019 0  % Final  . Abs Immature Granulocytes 07/27/2019 0.00  0.00 - 0.07 K/uL Final   Performed at [facility], [place] [address], [place], [place] [address]    (this displays the last labs from the last 3 days)  No results found for: TOTALPROTELP, ALBUMINELP, A1GS, A2GS, BETS, BETA2SER, GAMS, MSPIKE, SPEI (this displays SPEP labs)  No results found for: KPAFRELGTCHN, LAMBDASER, KAPLAMBRATIO (kappa/lambda light chains)  No results found for: HGBA, HGBA2QUANT, HGBFQUANT, HGBSQUAN (Hemoglobinopathy evaluation)   No results found for: LDH  Lab Results  Component Value Date   IRON 46 05/11/2013   TIBC 254 05/11/2013   IRONPCTSAT 18 (L) 05/11/2013   (Iron and TIBC)  Lab Results  Component Value Date   FERRITIN 72 05/11/2013    Urinalysis    Component Value Date/Time   BILIRUBINUR Small 11/09/2018 0914   PROTEINUR Negative 11/09/2018 0914   UROBILINOGEN 0.2 11/09/2018 0914   NITRITE Negative 11/09/2018 0914   LEUKOCYTESUR  Negative 11/09/2018 0914     STUDIES:  No results found.   ELIGIBLE FOR AVAILABLE RESEARCH PROTOCOL: no   ASSESSMENT: 58 y.o. [place] Ser, [place] woman status post right breast upper inner quadrant biopsy 10/28/2018 for a clinical T2 N1, stage IIA invasive ductal carcinoma, grade 2, estrogen receptor positive, progesterone receptor negative, HER-2 amplified, with an MIB-1 of 20%  (a) staging work-up with CT scans of the chest abdomen and pelvis and thyroid ultrasonography shows multiple findings that will require follow-up:   (i) two 0.5 cm liver lesions   (ii) possible bladder mass, thickened sigmoid and rectum, and prominent adnexa   (iii) thyroid nodule biopsied 11/19/2018, read as [facility] 3    (iiii)  thyroid lymph node biopsied 11/19/2018 showing no evidence of carcinoma  (1) neoadjuvant chemotherapy will consist of carboplatin, docetaxel, trastuzumab, and Pertuzumab every 21 days x 6 starting 11/17/2018  (a) pertuzumab discontinued after cycle 1 due to severe diarrhea  (2) trastuzumab will be continued to complete 1 year (through March 2021).  (a) baseline echocardiogram 11/11/2018 showed  an ejection fraction in the 55-60% range  (b) echocardiogram 03/10/2019 shows EF of 60-65%  (c) echo 06/08/2019 shows EF 60%  (3) Right breast lumpectomy on 04/15/2019: no residual carcinoma (ypT0 ypN0)  (a) a total of 4 lymph nodes were removed  (4) adjuvant radiation 05/31/2019 - 07/14/2019 Site Technique Total Dose (Gy) Dose per Fx (Gy) Completed Fx Beam Energies  Breast: Breast_Rt 3D 50.4/50.4 1.8 28/28 6X, 10X  Breast: Breast_Rt_axilla 3D 45/45 1.8 25/25 6X, 10X   (5) antiestrogens at the completion of local treatment  (6) referral to thyroid surgeon at the completion of adjuvant radiation  (7) very small liver abnormalities will require follow-up but are almost certainly benign    PLAN:  Alizae is doing well today.  She tolerated radiation quite well and continues to heal.  She is  going to proceed with Trastuzumab today and her labs are stable.  We reviewed the issue of anti estrogens.  We needs some more information before deciding which one to start her on.  First, since she is 17 and still has her ovaries, with no uterus, she is likely post menopausal, but we will get an [facility] and estradiol to confirm that.  I also placed orders for a bone density test which will be completed this week.  I talked to her briefly about Anastrozole and Tamoxifen.  She will read more about it, we will get the above tests and she will return in 3 weeks to meet with Dr. Jana Hakim and decide.    Vickii Chafe will continue with her trastuzumab today and every 21 days through early March.  She was recommended to continue with the appropriate pandemic precautions. She knows to call for any questions that may arise between now and her next appointment.  We are happy to see her sooner if needed.  A total of (30) minutes of face-to-face time was spent with this patient with greater than 50% of that time in counseling and care-coordination.    Wilber Bihari, NP Medical Oncology and Hematology [facility] [address] [place], [place] [address] Tel. [phone]    Fax. [phone]

## 2019-07-28 LAB — FOLLICLE STIMULATING HORMONE: FSH: 106 m[IU]/mL

## 2019-07-30 ENCOUNTER — Ambulatory Visit
Admission: RE | Admit: 2019-07-30 | Discharge: 2019-07-30 | Disposition: A | Discharge status: Home / Self Care | Payer: BC Managed Care – PPO | Source: Ambulatory Visit | Attending: Adult Health | Admitting: Adult Health

## 2019-07-30 ENCOUNTER — Telehealth: Payer: Self-pay

## 2019-07-30 ENCOUNTER — Other Ambulatory Visit: Payer: Self-pay

## 2019-07-30 DIAGNOSIS — M8589 Other specified disorders of bone density and structure, multiple sites: Secondary | ICD-10-CM | POA: Diagnosis not present

## 2019-07-30 DIAGNOSIS — Z780 Asymptomatic menopausal state: Secondary | ICD-10-CM | POA: Diagnosis not present

## 2019-07-30 DIAGNOSIS — Z170 Estrogen receptor positive status [ER+]: Secondary | ICD-10-CM

## 2019-07-30 NOTE — Telephone Encounter (Signed)
Spoke with patient informing of BD results.  Informed of NP recommendations and that results will be discussed at next appt 12/29.  Patient voiced understanding.

## 2019-07-30 NOTE — Telephone Encounter (Signed)
-----   Message from Gardenia Phlegm, NP sent at 07/30/2019 10:56 AM EST ----- Please let Debbie [name] know that she has osteopenia, and that we will talk with her about it further at her appointment on 12/29.  Calcium, vitamin d, and weight bearing exercises are recommended.  Thanks. ----- Message ----- From: Interface, Rad Results In Sent: 07/30/2019   9:56 AM EST To: Gardenia Phlegm, NP

## 2019-08-02 LAB — ESTRADIOL, ULTRA SENS: Estradiol, Sensitive: 4.4 pg/mL

## 2019-08-09 ENCOUNTER — Telehealth: Payer: Self-pay | Admitting: Neurology

## 2019-08-09 NOTE — Telephone Encounter (Signed)
Patient called with questions about Covid exposure.  She states daughter's son's mother is being tested for Covid. Daughter has been around her, but having no symptoms, has also worn a mask the entire time during interactions. They are expected to get results tomorrow. Patient wanted to know if safe for her daughter to be back home.  I did let her know the risk was minimal since wearing PPE and she doesn't even know if the mother has Covid. Up to their discretion. whether they should be around each other. She expressed appreciation.

## 2019-08-16 ENCOUNTER — Encounter: Payer: Self-pay | Admitting: *Deleted

## 2019-08-16 NOTE — Progress Notes (Signed)
Debbie [name]  [phone] [phone]) [phone]    ID: [name] DOB: 07-22-1961  MR#: [id number]  PJA#:[id number]  Patient Care Team: Lavada Mesi as PCP - General (Family Medicine) Mauro Kaufmann, RN as Oncology Nurse Navigator Rockwell Germany, RN as Oncology Nurse Navigator Alphonsa Overall, MD as Consulting Physician (General Surgery) Eileene Kisling, Virgie Dad, MD as Consulting Physician (Oncology) Gery Pray, MD as Consulting Physician (Radiation Oncology) Burden, Lincoln Brigham, MD as Referring Physician (Ophthalmology) Larey Dresser, MD as Consulting Physician (Cardiology) OTHER MD: Earl Lagos, MD (Ophthalmology)   CHIEF COMPLAINT: HER-2 positive breast cancer  CURRENT TREATMENT: maintenance Trastuzumab; to start tamoxifen   INTERVAL HISTORY: Taylinn returns today for follow-up and treatment of her HER-2 positive breast cancer.   She is due for Trastuzumab today.  She tolerates this well . Her most recent echocardiogram on 06/08/2019 showed an EF of 60%, similar to prior in 02/2019. She is scheduled for repeat echo on 09/15/2019.  She completed her radiation, with mild to moderate desquamation, which has resolved.  She still has "zingers" in the lateral aspect of the right breast.  Since her last visit, she underwent bone density screening on 07/30/2019. This showed a T-score of -1.8, which is considered osteopenic.  She is here to discuss antiestrogens. She underwent an [facility] and estradiol on 07/27/2019.  These confirm that she is clearly in the menopausal range.   REVIEW OF SYSTEMS: [name] did not do much for Christmas this year given the pandemic.  They are planning to have a small celebration at [employer].  She is exercising regularly.  Her hair has come back salt-and-pepper and she likes it.  She is walking more now that she cannot go to the gym.  She would like to lose weight.  A detailed review of systems today was otherwise  stable except as noted above.   HISTORY OF CURRENT ILLNESS: From the original intake note:  [name] had routine screening mammography on 10/21/2018 showing a possible abnormality in the right breast. She felt a lump in her breast, possibly dating back to 01/2018, but she didn't think anything about it due to her history with fibrous breasts; she has had fibrous breasts all her life and began to have mammograms at the age of 58.   She underwent right breast ultrasonography at [facility] on 10/28/2018 showing: On physical exam, a firm, fixed mass is palpated in the superior right breast. Targeted ultrasound is performed, showing an irregular hypoechoic mass at the 1 o'clock position 4 cm from the nipple. It measures 2.8 x 2.4 x 1.8 cm. There is associated peripheral and internal vascularity. An oval, circumscribed hypoechoic mass is also identified within the vicinity at the 1 o'clock position 4 cm from the nipple. It measures 1.0 x 0.9 x 0.4 cm. This corresponds with an additional mammographically identified mass in the upper inner quadrant at middle depth. This is mammographically stable dating back to at least 2014. Additional stable, circumscribed masses are scattered throughout the bilateral breasts mammographically. Evaluation of the right axilla demonstrates a single, markedly abnormal lymph node with cortical thickening up to 1 cm. No additional suspicious lymphadenopathy is identified.  Accordingly on 10/28/2018 she proceeded to biopsy of the right breast area in question. The pathology from this procedure showed ([id number]): invasive ductal carcinoma, nottingham grade II of III, 2.8 cm, 1 o'clock, 4.0 cm from the nipple. Prognostic indicators significant for: estrogen receptor, 90% positive, with strong  staining intensity and progesterone receptor, 0% negative. Proliferation marker Ki67 at 20%. HER2 Positive (3+) by immunohistochemistry.   An additional biopsy of the right  axilla was performed on the same day ([id number]) showing:  2. Lymph node, needle/core biopsy, inferior right axilla - Tumor cells within fragmented nodal tissue  The patient's subsequent history is as detailed above.   PAST MEDICAL HISTORY: Past Medical History:  Diagnosis Date  . Anxiety   . Asthma    with severe URIs  . Bronchitis   . Essential hypertension, benign 12/01/2012  . Generalized anxiety disorder 12/01/2012  . GERD (gastroesophageal reflux disease)   . Hypertension    lost over 100lbs, no BP meds now, HCTZ is for edema  . Irritable bowel syndrome (IBS)    "had for years," per patient  . PONV (postoperative nausea and vomiting)   . Seasonal allergies   . Sleep apnea    uses CPAP nightly    PAST SURGICAL HISTORY: Past Surgical History:  Procedure Laterality Date  . ABDOMINAL HYSTERECTOMY  2011   with vaginal sling  . ANKLE SURGERY    . BREAST CYST EXCISION Bilateral   . BREAST EXCISIONAL BIOPSY Bilateral   . BREAST LUMPECTOMY WITH RADIOACTIVE SEED AND AXILLARY LYMPH NODE DISSECTION Right 04/15/2019   Procedure: RIGHT BREAST LUMPECTOMY WITH RADIOACTIVE SEED AND  TARGETED RIGHT AXILLARY LYMPH NODE DISSECTION WITH RADIOACTIVE SEED;  Surgeon: Alphonsa Overall, MD;  Location: [facility];  Service: General;  Laterality: Right;  . CHOLECYSTECTOMY  2012  . KNEE ARTHROSCOPY W/ MEDIAL COLLATERAL LIGAMENT (MCL) REPAIR Left 10-07-2012  . PORTACATH PLACEMENT N/A 11/16/2018   Procedure: INSERTION PORT-A-CATH WITH ULTRASOUND;  Surgeon: Alphonsa Overall, MD;  Location: [facility];  Service: General;  Laterality: N/A;  . TONSILLECTOMY  1969    FAMILY HISTORY: Family History  Problem Relation Age of Onset  . Hypertension Mother   . Rheum arthritis Mother   . Heart failure Mother   . Hypertension Father   . Alcoholism Father   . Colon cancer Father   . Lung cancer Father   . Depression Father   . Diabetes Father   . Skin cancer Father   . Hypertension Brother   .  Stroke Brother   . Atrial fibrillation Brother   . Hyperlipidemia Maternal Grandfather   . Stroke Paternal Grandmother   . Skin cancer Paternal Grandmother   . Healthy Daughter   . Healthy Daughter   . Breast cancer Neg Hx   . Ovarian cancer Neg Hx   . Prostate cancer Neg Hx   . Pancreatic cancer Neg Hx    Revonda's father died from lung cancer at age 80. Patients' mother died from congestive heart failure at age 49. The patient has 1 brother. Patient denies anyone in her family having breast, ovarian, prostate, or pancreatic cancer. Harleyquinn's father was diagnosed with skin cancer, colon cancer, and lung cancer. [name]'s paternal grandmother was diagnosed with skin cancer.    GYNECOLOGIC HISTORY:  No LMP recorded. Patient has had a hysterectomy. 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 Her husband, Bhavika Schnider, is in [employer]	[place]. They have two daughters, Kerri Perches and Vashon. Helmetta is 69, lives in [place],  [place], and works in Building surveyor. Adrian [employer] is 33, lives in [place], [place], and is currently in school; she currently works part time as a Publishing copy.   ADVANCED DIRECTIVES: In the absence of any documentation, Kyeisha's spouse, [name], is her healthcare power of attorney.      HEALTH MAINTENANCE: Social History   Tobacco Use  . Smoking status: Never Smoker  . Smokeless tobacco: Never Used  Substance Use Topics  . Alcohol use: Yes    Comment: rare  . Drug use: No    Colonoscopy: yes, 2015, [facility]   PAP: 2011  Bone density: 07/2019, -1.8 Mammography: 10/21/2018  Allergies  Allergen Reactions  . Azithromycin Diarrhea  .  Erythromycin Itching  . Erythromycin Base Itching  . Levofloxacin     Tendon pain  . Penicillin G Rash, Swelling and Hives  . Penicillins Swelling and Rash    Did it involve swelling of the face/tongue/throat, SOB, or low BP? Yes Did it involve sudden or severe rash/hives, skin peeling, or any reaction on the inside of your mouth or nose? No Did you need to seek medical attention at a hospital or doctor's office? Yes When did it last happen?40+ years ago If all above answers are "NO", may proceed with cephalosporin use.     Current Outpatient Medications  Medication Sig Dispense Refill  . AMBULATORY NON FORMULARY MEDICATION Continuous positive airway pressure (CPAP) machine set at autopap, with all supplemental supplies as needed. 1 each 0  . calcium-vitamin D (OSCAL WITH D) 500-200 MG-UNIT tablet Take 1 tablet by mouth daily with breakfast.    . hydrochlorothiazide (HYDRODIURIL) 12.5 MG tablet Take 1 tablet (12.5 mg total) by mouth daily. NEEDS APPT 90 tablet 0  . loratadine (CLARITIN) 10 MG tablet Take 10 mg by mouth daily as needed.     Marland Kitchen losartan (COZAAR) 50 MG tablet Take 1 tablet (50 mg total) by mouth daily. 30 tablet 6  . montelukast (SINGULAIR) 10 MG tablet TAKE 1 TABLET AT BEDTIME 90 tablet 3  . Multiple Vitamin (MULTIVITAMIN WITH MINERALS) TABS tablet Take 1 tablet by mouth daily.    . pantoprazole (PROTONIX) 40 MG tablet Take 1 tablet (40 mg total) by mouth daily. 90 tablet 4  . potassium chloride (MICRO-K) 10 MEQ CR capsule Take 2 capsules (20 mEq total) by mouth daily. 180 capsule 2  . PROAIR HFA 108 (90 Base) MCG/ACT inhaler Inhale 2 puffs into the lungs every 4 (four) hours as needed for wheezing or shortness of breath.    . valACYclovir (VALTREX) 1000 MG tablet Take 1 tablet (1,000 mg total) by mouth 2 (two) times daily as needed (cold sore). 60 tablet 1  . zolpidem (AMBIEN) 5 MG tablet Take 1 tablet (5 mg total) by mouth at bedtime as needed for sleep. 20 tablet 0     No current facility-administered medications for this visit.     OBJECTIVE: Middle-aged white woman in no acute distress  Vitals:   08/17/19 0833  BP: 130/84  Pulse: 85  Resp: 20  Temp: 97.9 F (36.6 C)  SpO2: 98%     Body mass index is 30.2 kg/m.   Wt Readings from Last 3 Encounters:  08/17/19 187 lb 1.6 oz (84.9 kg)  07/27/19 184 lb 4.8 oz (83.6 kg)  06/15/19 182 lb 3.2 oz (82.6 kg)  ECOG FS:1 - Symptomatic but completely ambulatory  Sclerae unicteric, EOMs intact Wearing a mask No cervical or supraclavicular adenopathy Lungs no rales  or rhonchi Heart regular rate and rhythm Abd soft, nontender, positive bowel sounds MSK no focal spinal tenderness, no upper extremity lymphedema Neuro: nonfocal, well oriented, appropriate affect Breasts: The right breast is status post lumpectomy and radiation.  The cosmetic result is very good.  There is some coarsening of the skin, minimal hyperpigmentation, and a slight dimple i on the lateral aspect.  There is no evidence of local recurrence.  Left breast is benign.  Both axillae are benign.   LAB RESULTS:  CMP     Component Value Date/Time   NA 143 07/27/2019 1232   K 3.4 (L) 07/27/2019 1232   CL 106 07/27/2019 1232   CO2 27 07/27/2019 1232   GLUCOSE 88 07/27/2019 1232   BUN 15 07/27/2019 1232   CREATININE 0.70 07/27/2019 1232   CREATININE 0.71 01/20/2019 0752   CREATININE 0.66 07/08/2017 1127   CALCIUM 9.3 07/27/2019 1232   PROT 6.5 07/27/2019 1232   ALBUMIN 4.1 07/27/2019 1232   AST 25 07/27/2019 1232   AST 42 (H) 01/20/2019 0752   ALT 23 07/27/2019 1232   ALT 42 01/20/2019 0752   ALKPHOS 109 07/27/2019 1232   BILITOT 0.9 07/27/2019 1232   BILITOT 0.5 01/20/2019 0752   GFRNONAA >60 07/27/2019 1232   GFRNONAA >60 01/20/2019 0752   GFRNONAA 98 07/08/2017 1127   GFRAA >60 07/27/2019 1232   GFRAA >60 01/20/2019 0752   GFRAA 114 07/08/2017 1127    No results found for: TOTALPROTELP, ALBUMINELP, A1GS, A2GS, BETS,  BETA2SER, GAMS, MSPIKE, SPEI  No results found for: KPAFRELGTCHN, LAMBDASER, KAPLAMBRATIO  Lab Results  Component Value Date   WBC 4.2 08/17/2019   NEUTROABS 2.7 08/17/2019   HGB 12.7 08/17/2019   HCT 36.5 08/17/2019   MCV 87.7 08/17/2019   PLT 199 08/17/2019    No results found for: LABCA2  No components found for: IWLNLG921  No results for input(s): INR in the last 168 hours.  No results found for: LABCA2  No results found for: JHE174  No results found for: YCX448  No results found for: JEH631  No results found for: CA2729  No components found for: HGQUANT  No results found for: CEA1 / No results found for: CEA1   No results found for: AFPTUMOR  No results found for: CHROMOGRNA  No results found for: HGBA, HGBA2QUANT, HGBFQUANT, HGBSQUAN (Hemoglobinopathy evaluation)   No results found for: LDH  Lab Results  Component Value Date   IRON 46 05/11/2013   TIBC 254 05/11/2013   IRONPCTSAT 18 (L) 05/11/2013   (Iron and TIBC)  Lab Results  Component Value Date   FERRITIN 72 05/11/2013    Urinalysis    Component Value Date/Time   BILIRUBINUR Small 11/09/2018 0914   PROTEINUR Negative 11/09/2018 0914   UROBILINOGEN 0.2 11/09/2018 0914   NITRITE Negative 11/09/2018 0914   LEUKOCYTESUR Negative 11/09/2018 0914     STUDIES:  DG Bone Density  Result Date: 07/30/2019 EXAM: DUAL X-RAY ABSORPTIOMETRY (DXA) FOR BONE MINERAL DENSITY IMPRESSION: Referring Physician:  Gardenia Phlegm Your patient completed a BMD test using Lunar IDXA DXA system ( analysis version: 16 ) manufactured by [employer]. Technologist: AW PATIENT: Name: Alfie, Alderfer Patient ID: [id number] Birth Date: 18-Sep-1960 Height: 66.0 in. Sex: Female Measured: 07/30/2019 Weight: 183.6 lbs. Indications: Breast Cancer History, Caucasian, Estrogen Deficient, Hysterectomy, Pantoprazole, Postmenopausal Fractures: Ankle Treatments: Calcium (E943.0), Vitamin D (E933.5) ASSESSMENT: The BMD  measured at AP Spine L1-L4 is 0.968 g/cm2 with a T-score of -1.8.  This patient is considered osteopenic according to [facility]) criteria. The scan quality is good. Site Region Measured Date Measured Age YA BMD Significant CHANGE T-score AP Spine  L1-L4       07/30/2019    59.2         -1.8    0.968 g/cm2 DualFemur Total Right 07/30/2019    59.2         -1.4    0.827 g/cm2 DualFemur Total Mean  07/30/2019    59.2         -1.4    0.836 g/cm2 World Health Organization [facility]) criteria for post-menopausal, Caucasian Women: Normal       T-score at or above -1 SD Osteopenia   T-score between -1 and -2.5 SD Osteoporosis T-score at or below -2.5 SD RECOMMENDATION: 1. All patients should optimize calcium and vitamin D intake. 2. Consider FDA approved medical therapies in postmenopausal women and men aged 31 years and older, based on the following: a. A hip or vertebral (clinical or morphometric) fracture b. T- score < or = -2.5 at the femoral neck or spine after appropriate evaluation to exclude secondary causes c. Low bone mass (T-score between -1.0 and -2.5 at the femoral neck or spine) and a 10 year probability of a hip fracture > or = 3% or a 10 year probability of a major osteoporosis-related fracture > or = 20% based on the [place]-adapted WHO algorithm d. Clinician judgment and/or patient preferences may indicate treatment for people with 10-year fracture probabilities above or below these levels FOLLOW-UP: Patients with diagnosis of osteoporosis or at high risk for fracture should have regular bone mineral density tests. For patients eligible for Medicare, routine testing is allowed once every 2 years. The testing frequency can be increased to one year for patients who have rapidly progressing disease, those who are receiving or discontinuing medical therapy to restore bone mass, or have additional risk factors. I have reviewed this report and agree with the above findings. [facility] FRAX*  10-year Probability of Fracture Based on femoral neck BMD: DualFemur (Right) Major Osteoporotic Fracture: 7.2% Hip Fracture:                0.5% Population:                  [place] (Caucasian) Risk Factors:                None *FRAX is a Materials engineer of the [employer] of [employer] for Metabolic Bone Disease, a World Pharmacologist (WHO) [facility]. ASSESSMENT: The probability of a major osteoporotic fracture is 7.2 % within the next ten years. The probability of a hip fracture is 0.5 % within the next ten years. Electronically Signed   By: Dorise Bullion III M.D   On: 07/30/2019 09:54     ELIGIBLE FOR AVAILABLE RESEARCH PROTOCOL: no   ASSESSMENT: 58 y.o. [place], [place] woman status post right breast upper inner quadrant biopsy 10/28/2018 for a clinical T2 N1, stage IIA invasive ductal carcinoma, grade 2, estrogen receptor positive, progesterone receptor negative, HER-2 amplified, with an MIB-1 of 20%  (a) staging work-up with CT scans of the chest abdomen and pelvis and thyroid ultrasonography shows multiple findings that will require follow-up:   (i) two 0.5 cm liver lesions   (ii) possible bladder mass, thickened sigmoid and rectum, and prominent adnexa   (iii) thyroid nodule biopsied 11/19/2018, read as [place] 3 (risk of malignancy 5-15%)   (  iiii)  thyroid lymph node biopsied 11/19/2018 showing no evidence of carcinoma   (iiiii) abd MRI 05/07 2020 confirms two tiny liver lesions. No bladder or adnexal malignancy  (1) neoadjuvant chemotherapy consisting of carboplatin, docetaxel, trastuzumab, and pertuzumab every 21 days x 6 started 11/17/2018, copleted 03/02/2019  (a) pertuzumab discontinued after cycle 1 due to severe diarrhea  (2) trastuzumab continued to complete 1 year (through March 2021).  (a) baseline echocardiogram 11/11/2018 showed an ejection fraction in the 55-60% range  (b) echocardiogram 03/10/2019 shows EF of 60-65%  (c) echo 06/08/2019 shows  EF 60%  (d) echo 09/14/2018  (3) Right breast lumpectomy on 04/15/2019: no residual carcinoma (ypT0 ypN0)  (a) a total of 4 lymph nodes were removed  (4) adjuvant radiation 05/31/2019 - 07/14/2019 Site Technique Total Dose (Gy) Dose per Fx (Gy) Completed Fx Beam Energies  Breast: Breast_Rt 3D 50.4/50.4 1.8 28/28 6X, 10X  Breast: Breast_Rt_axilla 3D 45/45 1.8 25/25 6X, 10X   (5) to start tamoxifen 08/20/2019  (a) s/p hysterectomy  (b) bone density 07/30/2019 shows a T score of -1.8   (c) used oral contraceptives for 7 or 8 years in the past with no complications  (6) referral to thyroid surgeon at the completion of adjuvant radiation  (7) very small liver abnormalities will require follow-up but are almost certainly benign     PLAN: Amoreena has completed local treatment for her breast cancer.  She is continuing anti-HER-2 therapy.  She is ready to consider antiestrogens.  We discussed the difference between tamoxifen and anastrozole in detail. She understands that anastrozole and the aromatase inhibitors in general work by blocking estrogen production. Accordingly vaginal dryness, decrease in bone density, and of course hot flashes can result. The aromatase inhibitors can also negatively affect the cholesterol profile, although that is a minor effect. One out of 5 women on aromatase inhibitors we will feel "old and achy". This arthralgia/myalgia syndrome, which resembles fibromyalgia clinically, does resolve with stopping the medications. Accordingly this is not a reason to not try an aromatase inhibitor but it is a frequent reason to stop it (in other words 20% of women will not be able to tolerate these medications).  Tamoxifen on the other hand does not block estrogen production. It does not "take away a woman's estrogen". It blocks the estrogen receptor in breast cells. Like anastrozole, it can also cause hot flashes. As opposed to anastrozole, tamoxifen has many estrogen-like effects.  It is technically an estrogen receptor modulator. This means that in some tissues tamoxifen works like estrogen-- for example it helps strengthen the bones. It tends to improve the cholesterol profile. It can cause thickening of the endometrial lining, and even endometrial polyps or rarely cancer of the uterus.(The risk of uterine cancer due to tamoxifen is one additional cancer per thousand women year). It can cause vaginal wetness or stickiness. It can cause blood clots through this estrogen-like effect--the risk of blood clots with tamoxifen is exactly the same as with birth control pills or hormone replacement.  Neither of these agents causes mood changes or weight gain, despite the popular belief that they can have these side effects. We have data from studies comparing either of these drugs with placebo, and in those cases the control group had the same amount of weight gain and depression as the group that took the drug.  I think she is a very good candidate for tamoxifen given that she is status post hysterectomy, and took antiestrogens for many years with no  complications.  She is very eager to start.  I have put in the prescription for her.  She will also start venlafaxine low-dose at the same time.  I suggested she mention her palpitations to Dr. Haroldine Laws next time she sees them, which will be in about 2 weeks.  This does not seem clinically significant however.  She will see me again in about 2 months.  She knows to call for any other issue that may develop before the next visit.  Virgie Dad. Ronell Boldin, MD Medical Oncology and Hematology [facility] [place], [place] [address] Tel. [phone]    Fax. [phone]   I, Wilburn Mylar, am acting as scribe for Dr. Virgie Dad. Patte Winkel.  I, Lurline Del MD, have reviewed the above documentation for accuracy and completeness, and I agree with the above.

## 2019-08-17 ENCOUNTER — Inpatient Hospital Stay: Payer: BC Managed Care – PPO

## 2019-08-17 ENCOUNTER — Inpatient Hospital Stay (HOSPITAL_BASED_OUTPATIENT_CLINIC_OR_DEPARTMENT_OTHER): Payer: BC Managed Care – PPO | Admitting: Oncology

## 2019-08-17 ENCOUNTER — Other Ambulatory Visit: Payer: Self-pay

## 2019-08-17 VITALS — BP 130/84 | HR 85 | Temp 97.9°F | Resp 20 | Ht 66.0 in | Wt 187.1 lb

## 2019-08-17 DIAGNOSIS — Z170 Estrogen receptor positive status [ER+]: Secondary | ICD-10-CM

## 2019-08-17 DIAGNOSIS — Z95828 Presence of other vascular implants and grafts: Secondary | ICD-10-CM

## 2019-08-17 DIAGNOSIS — C50211 Malignant neoplasm of upper-inner quadrant of right female breast: Secondary | ICD-10-CM

## 2019-08-17 DIAGNOSIS — Z5112 Encounter for antineoplastic immunotherapy: Secondary | ICD-10-CM | POA: Diagnosis not present

## 2019-08-17 DIAGNOSIS — Z79899 Other long term (current) drug therapy: Secondary | ICD-10-CM | POA: Diagnosis not present

## 2019-08-17 LAB — CBC WITH DIFFERENTIAL/PLATELET
Abs Immature Granulocytes: 0.01 10*3/uL (ref 0.00–0.07)
Basophils Absolute: 0 10*3/uL (ref 0.0–0.1)
Basophils Relative: 0 %
Eosinophils Absolute: 0.3 10*3/uL (ref 0.0–0.5)
Eosinophils Relative: 6 %
HCT: 36.5 % (ref 36.0–46.0)
Hemoglobin: 12.7 g/dL (ref 12.0–15.0)
Immature Granulocytes: 0 %
Lymphocytes Relative: 17 %
Lymphs Abs: 0.7 10*3/uL (ref 0.7–4.0)
MCH: 30.5 pg (ref 26.0–34.0)
MCHC: 34.8 g/dL (ref 30.0–36.0)
MCV: 87.7 fL (ref 80.0–100.0)
Monocytes Absolute: 0.5 10*3/uL (ref 0.1–1.0)
Monocytes Relative: 12 %
Neutro Abs: 2.7 10*3/uL (ref 1.7–7.7)
Neutrophils Relative %: 65 %
Platelets: 199 10*3/uL (ref 150–400)
RBC: 4.16 MIL/uL (ref 3.87–5.11)
RDW: 13.6 % (ref 11.5–15.5)
WBC: 4.2 10*3/uL (ref 4.0–10.5)
nRBC: 0 % (ref 0.0–0.2)

## 2019-08-17 LAB — COMPREHENSIVE METABOLIC PANEL
ALT: 26 U/L (ref 0–44)
AST: 26 U/L (ref 15–41)
Albumin: 4.1 g/dL (ref 3.5–5.0)
Alkaline Phosphatase: 110 U/L (ref 38–126)
Anion gap: 11 (ref 5–15)
BUN: 13 mg/dL (ref 6–20)
CO2: 28 mmol/L (ref 22–32)
Calcium: 9.4 mg/dL (ref 8.9–10.3)
Chloride: 104 mmol/L (ref 98–111)
Creatinine, Ser: 0.75 mg/dL (ref 0.44–1.00)
GFR calc Af Amer: >60 mL/min (ref >60)
GFR calc non Af Amer: >60 mL/min (ref >60)
Glucose, Bld: 86 mg/dL (ref 70–99)
Potassium: 3.2 mmol/L — ABNORMAL LOW (ref 3.5–5.1)
Sodium: 143 mmol/L (ref 135–145)
Total Bilirubin: 0.7 mg/dL (ref 0.3–1.2)
Total Protein: 6.5 g/dL (ref 6.5–8.1)

## 2019-08-17 MED ORDER — SODIUM CHLORIDE 0.9 % IV SOLN
Freq: Once | INTRAVENOUS | Status: AC
  Filled 2019-08-17: qty 250

## 2019-08-17 MED ORDER — ACETAMINOPHEN 325 MG PO TABS
ORAL_TABLET | ORAL | Status: AC
  Filled 2019-08-17: qty 2

## 2019-08-17 MED ORDER — TAMOXIFEN CITRATE 20 MG PO TABS: 20.0000 mg | ORAL_TABLET | Freq: Every day | ORAL | 12 refills | Status: AC

## 2019-08-17 MED ORDER — HEPARIN SOD (PORK) LOCK FLUSH 100 UNIT/ML IV SOLN
500.0000 [IU] | Freq: Once | INTRAVENOUS | Status: AC | PRN
  Administered 2019-08-17: 10:00:00 500 [IU]
  Filled 2019-08-17: qty 5

## 2019-08-17 MED ORDER — SODIUM CHLORIDE 0.9% FLUSH
10.0000 mL | INTRAVENOUS | Status: DC | PRN
  Administered 2019-08-17: 10 mL
  Filled 2019-08-17: qty 10

## 2019-08-17 MED ORDER — ACETAMINOPHEN 325 MG PO TABS
650.0000 mg | ORAL_TABLET | Freq: Once | ORAL | Status: AC
  Administered 2019-08-17: 650 mg via ORAL

## 2019-08-17 MED ORDER — VENLAFAXINE HCL ER 37.5 MG PO CP24: 37.5000 mg | ORAL_CAPSULE | Freq: Every day | ORAL | 4 refills | Status: DC

## 2019-08-17 MED ORDER — TRASTUZUMAB-DKST CHEMO 150 MG IV SOLR
6.0000 mg/kg | Freq: Once | INTRAVENOUS | Status: AC
  Administered 2019-08-17: 10:00:00 504 mg via INTRAVENOUS
  Filled 2019-08-17: qty 24

## 2019-08-17 MED ORDER — DIPHENHYDRAMINE HCL 25 MG PO CAPS
25.0000 mg | ORAL_CAPSULE | Freq: Once | ORAL | Status: AC
  Administered 2019-08-17: 25 mg via ORAL

## 2019-08-17 MED ORDER — SODIUM CHLORIDE 0.9% FLUSH
10.0000 mL | Freq: Once | INTRAVENOUS | Status: AC
  Administered 2019-08-17: 10 mL
  Filled 2019-08-17: qty 10

## 2019-08-17 MED ORDER — DIPHENHYDRAMINE HCL 25 MG PO CAPS
ORAL_CAPSULE | ORAL | Status: AC
  Filled 2019-08-17: qty 1

## 2019-08-17 NOTE — Patient Instructions (Signed)
[place] [facility] Discharge Instructions for Patients Receiving Chemotherapy  Today you received the following chemotherapy agents Trastuzumab-dkst  To help prevent nausea and vomiting after your treatment, we encourage you to take your nausea medication as directed.    If you develop nausea and vomiting that is not controlled by your nausea medication, call the clinic.   BELOW ARE SYMPTOMS THAT SHOULD BE REPORTED IMMEDIATELY:  *FEVER GREATER THAN 100.5 F  *CHILLS WITH OR WITHOUT FEVER  NAUSEA AND VOMITING THAT IS NOT CONTROLLED WITH YOUR NAUSEA MEDICATION  *UNUSUAL SHORTNESS OF BREATH  *UNUSUAL BRUISING OR BLEEDING  TENDERNESS IN MOUTH AND THROAT WITH OR WITHOUT PRESENCE OF ULCERS  *URINARY PROBLEMS  *BOWEL PROBLEMS  UNUSUAL RASH Items with * indicate a potential emergency and should be followed up as soon as possible.  Feel free to call the clinic should you have any questions or concerns. The clinic phone number is [phone].  Please show the [place] at check-in to the Emergency Department and triage nurse.

## 2019-08-24 ENCOUNTER — Other Ambulatory Visit: Payer: Self-pay | Admitting: *Deleted

## 2019-08-27 ENCOUNTER — Telehealth: Payer: Self-pay | Admitting: Oncology

## 2019-08-27 NOTE — Telephone Encounter (Signed)
Scheduled per 12/29 los, called patient and left a voicemail.

## 2019-09-04 ENCOUNTER — Other Ambulatory Visit: Payer: Self-pay | Admitting: Physician Assistant

## 2019-09-06 NOTE — Progress Notes (Signed)
[place]  [phone]) [phone] [phone]) [phone]    ID: Debbie [name] DOB: Jan 18, 1961  MR#: [id number]  YBO#:[id number]  Patient Care Team: Lavada Mesi as PCP - General (Family Medicine) Mauro Kaufmann, RN as Oncology Nurse Navigator Rockwell Germany, RN as Oncology Nurse Navigator Alphonsa Overall, MD as Consulting Physician (General Surgery) Magrinat, Virgie Dad, MD as Consulting Physician (Oncology) Gery Pray, MD as Consulting Physician (Radiation Oncology) Burden, Lincoln Brigham, MD as Referring Physician (Ophthalmology) Larey Dresser, MD as Consulting Physician (Cardiology) OTHER MD: Earl Lagos, MD (Ophthalmology)   CHIEF COMPLAINT: HER-2 positive breast cancer  CURRENT TREATMENT: maintenance Trastuzumab;  tamoxifen   INTERVAL HISTORY: Debbie [name] returns today for follow-up and treatment of her HER-2 positive breast cancer.   She is due for Trastuzumab today.  She tolerates this well . Her most recent echocardiogram on 06/08/2019 showed an EF of 60%, similar to prior in 02/2019. She is scheduled for repeat echo on 09/15/2019.  Since her last visit she was started on Tamoxifen daily.  She notes it is making her nauseated.  She says she has gained a couple of pounds since starting it as well.  She denies arthralgias, vaginal discharge.  She has occasional hot flashes.     REVIEW OF SYSTEMS: Debbie [name] has started counting her calories.  She is planning on walking more.  She denies any new issues such as fever, chills, chest pain, palpitations, cough, shortness of breath, headaches, vision changes, bowel/bladder changes.  A detailed ROS was otherwise non contributory.    HISTORY OF CURRENT ILLNESS: From the original intake note:  [place] had routine screening mammography on 10/21/2018 showing a possible abnormality in the right breast. She felt a lump in her breast, possibly dating back to 01/2018, but she didn't think anything  about it due to her history with fibrous breasts; she has had fibrous breasts all her life and began to have mammograms at the age of 38.   She underwent right breast ultrasonography at [facility] on 10/28/2018 showing: On physical exam, a firm, fixed mass is palpated in the superior right breast. Targeted ultrasound is performed, showing an irregular hypoechoic mass at the 1 o'clock position 4 cm from the nipple. It measures 2.8 x 2.4 x 1.8 cm. There is associated peripheral and internal vascularity. An oval, circumscribed hypoechoic mass is also identified within the vicinity at the 1 o'clock position 4 cm from the nipple. It measures 1.0 x 0.9 x 0.4 cm. This corresponds with an additional mammographically identified mass in the upper inner quadrant at middle depth. This is mammographically stable dating back to at least 2014. Additional stable, circumscribed masses are scattered throughout the bilateral breasts mammographically. Evaluation of the right axilla demonstrates a single, markedly abnormal lymph node with cortical thickening up to 1 cm. No additional suspicious lymphadenopathy is identified.  Accordingly on 10/28/2018 she proceeded to biopsy of the right breast area in question. The pathology from this procedure showed ([id number]): invasive ductal carcinoma, nottingham grade II of III, 2.8 cm, 1 o'clock, 4.0 cm from the nipple. Prognostic indicators significant for: estrogen receptor, 90% positive, with strong staining intensity and progesterone receptor, 0% negative. Proliferation marker Ki67 at 20%. HER2 Positive (3+) by immunohistochemistry.   An additional biopsy of the right axilla was performed on the same day ([id number]) showing:  2. Lymph node, needle/core biopsy, inferior right axilla - Tumor cells within fragmented nodal tissue  The patient's subsequent history is as  detailed above.   PAST MEDICAL HISTORY: Past Medical History:  Diagnosis Date  . Anxiety   . Asthma     with severe URIs  . Bronchitis   . Essential hypertension, benign 12/01/2012  . Generalized anxiety disorder 12/01/2012  . GERD (gastroesophageal reflux disease)   . Hypertension    lost over 100lbs, no BP meds now, HCTZ is for edema  . Irritable bowel syndrome (IBS)    "had for years," per patient  . PONV (postoperative nausea and vomiting)   . Seasonal allergies   . Sleep apnea    uses CPAP nightly    PAST SURGICAL HISTORY: Past Surgical History:  Procedure Laterality Date  . ABDOMINAL HYSTERECTOMY  2011   with vaginal sling  . ANKLE SURGERY    . BREAST CYST EXCISION Bilateral   . BREAST EXCISIONAL BIOPSY Bilateral   . BREAST LUMPECTOMY WITH RADIOACTIVE SEED AND AXILLARY LYMPH NODE DISSECTION Right 04/15/2019   Procedure: RIGHT BREAST LUMPECTOMY WITH RADIOACTIVE SEED AND  TARGETED RIGHT AXILLARY LYMPH NODE DISSECTION WITH RADIOACTIVE SEED;  Surgeon: Alphonsa Overall, MD;  Location: [facility];  Service: General;  Laterality: Right;  . CHOLECYSTECTOMY  2012  . KNEE ARTHROSCOPY W/ MEDIAL COLLATERAL LIGAMENT (MCL) REPAIR Left 10-07-2012  . PORTACATH PLACEMENT N/A 11/16/2018   Procedure: INSERTION PORT-A-CATH WITH ULTRASOUND;  Surgeon: Alphonsa Overall, MD;  Location: [facility];  Service: General;  Laterality: N/A;  . TONSILLECTOMY  1969    FAMILY HISTORY: Family History  Problem Relation Age of Onset  . Hypertension Mother   . Rheum arthritis Mother   . Heart failure Mother   . Hypertension Father   . Alcoholism Father   . Colon cancer Father   . Lung cancer Father   . Depression Father   . Diabetes Father   . Skin cancer Father   . Hypertension Brother   . Stroke Brother   . Atrial fibrillation Brother   . Hyperlipidemia Maternal Grandfather   . Stroke Paternal Grandmother   . Skin cancer Paternal Grandmother   . Healthy Daughter   . Healthy Daughter   . Breast cancer Neg Hx   . Ovarian cancer Neg Hx   . Prostate cancer Neg Hx   . Pancreatic cancer Neg Hx     Debbie [name]'s father died from lung cancer at age 9. Patients' mother died from congestive heart failure at age 73. The patient has 1 brother. Patient denies anyone in her family having breast, ovarian, prostate, or pancreatic cancer. Debbie [name]'s father was diagnosed with skin cancer, colon cancer, and lung cancer. Debbie [name]'s paternal grandmother was diagnosed with skin cancer.    GYNECOLOGIC HISTORY:  No LMP recorded. Patient has had a hysterectomy. 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 Her husband, Debbie [name], is in [employer]	[place]. They have two daughters, Debbie [name] and Debbie [name]. [address] Debbie [name] is 66, lives in [place], [place], and works in Building surveyor. Debbie [name] Intercourse is 33, lives in [place], [place], and is currently in school; she currently works [name] time as a Publishing copy.   ADVANCED DIRECTIVES: In the absence of any documentation, Debbie [name]'s spouse, Debbie [name], is her healthcare power of attorney.      HEALTH MAINTENANCE: Social  History   Tobacco Use  . Smoking status: Never Smoker  . Smokeless tobacco: Never Used  Substance Use Topics  . Alcohol use: Yes    Comment: rare  . Drug use: No    Colonoscopy: yes, 2015, [facility]   PAP: 2011  Bone density: 07/2019, -1.8 Mammography: 10/21/2018  Allergies  Allergen Reactions  . Azithromycin Diarrhea  . Erythromycin Itching  . Erythromycin Base Itching  . Levofloxacin     Tendon pain  . Penicillin G Rash, Swelling and Hives  . Penicillins Swelling and Rash    Did it involve swelling of the face/tongue/throat, SOB, or low BP? Yes Did it involve sudden or severe rash/hives, skin peeling, or any reaction on the inside  of your mouth or nose? No Did you need to seek medical attention at a hospital or doctor's office? Yes When did it last happen?40+ years ago If all above answers are "NO", may proceed with cephalosporin use.     Current Outpatient Medications  Medication Sig Dispense Refill  . AMBULATORY NON FORMULARY MEDICATION Continuous positive airway pressure (CPAP) machine set at autopap, with all supplemental supplies as needed. 1 each 0  . calcium-vitamin D (OSCAL WITH D) 500-200 MG-UNIT tablet Take 1 tablet by mouth daily with breakfast.    . hydrochlorothiazide (HYDRODIURIL) 12.5 MG tablet TAKE 1 TABLET DAILY (NEED APPOINTMENT) 30 tablet 0  . loratadine (CLARITIN) 10 MG tablet Take 10 mg by mouth daily as needed.     Marland Kitchen losartan (COZAAR) 50 MG tablet Take 1 tablet (50 mg total) by mouth daily. 30 tablet 6  . montelukast (SINGULAIR) 10 MG tablet TAKE 1 TABLET AT BEDTIME 90 tablet 3  . Multiple Vitamin (MULTIVITAMIN WITH MINERALS) TABS tablet Take 1 tablet by mouth daily.    . pantoprazole (PROTONIX) 40 MG tablet Take 1 tablet (40 mg total) by mouth daily. 90 tablet 4  . potassium chloride (MICRO-K) 10 MEQ CR capsule Take 2 capsules (20 mEq total) by mouth daily. 180 capsule 2  . PROAIR HFA 108 (90 Base) MCG/ACT inhaler Inhale 2 puffs into the lungs every 4 (four) hours as needed for wheezing or shortness of breath.    . tamoxifen (NOLVADEX) 20 MG tablet Take 1 tablet (20 mg total) by mouth daily. 90 tablet 12  . valACYclovir (VALTREX) 1000 MG tablet Take 1 tablet (1,000 mg total) by mouth 2 (two) times daily as needed (cold sore). 60 tablet 1  . venlafaxine XR (EFFEXOR-XR) 37.5 MG 24 hr capsule Take 1 capsule (37.5 mg total) by mouth daily with breakfast. 90 capsule 4  . zolpidem (AMBIEN) 5 MG tablet Take 1 tablet (5 mg total) by mouth at bedtime as needed for sleep. 20 tablet 0  . calcium carbonate (OSCAL) 1500 (600 Ca) MG TABS tablet Take by mouth 2 (two) times daily with a meal.     No  current facility-administered medications for this visit.     OBJECTIVE: Middle-aged white woman in no acute distress  Vitals:   09/07/19 0916  BP: (!) 154/76  Pulse: 77  Resp: 18  Temp: 98.7 F (37.1 C)  SpO2: 98%     Body mass index is 30.3 kg/m.   Wt Readings from Last 3 Encounters:  09/07/19 187 lb 11.2 oz (85.1 kg)  08/17/19 187 lb 1.6 oz (84.9 kg)  07/27/19 184 lb 4.8 oz (83.6 kg)  ECOG FS:1 - Symptomatic but completely ambulatory  GENERAL: Patient is a well appearing female in no acute distress  HEENT:  Sclerae anicteric.  Oropharynx clear and moist. No ulcerations or evidence of oropharyngeal candidiasis. Neck is supple.  NODES:  No cervical, supraclavicular, or axillary lymphadenopathy palpated.  BREAST EXAM:  Right breast s/p lumpectomy and radiation, mildly erythematous, and slightly swollen, left breast benign LUNGS:  Clear to auscultation bilaterally.  No wheezes or rhonchi. HEART:  Regular rate and rhythm. No murmur appreciated. ABDOMEN:  Soft, nontender.  Positive, normoactive bowel sounds. No organomegaly palpated. MSK:  No focal spinal tenderness to palpation. Full range of motion bilaterally in the upper extremities. EXTREMITIES:  No peripheral edema.   SKIN:  Clear with no obvious rashes or skin changes. No nail dyscrasia. NEURO:  Nonfocal. Well oriented.  Appropriate affect.    LAB RESULTS:  CMP     Component Value Date/Time   NA 143 08/17/2019 0831   K 3.2 (L) 08/17/2019 0831   CL 104 08/17/2019 0831   CO2 28 08/17/2019 0831   GLUCOSE 86 08/17/2019 0831   BUN 13 08/17/2019 0831   CREATININE 0.75 08/17/2019 0831   CREATININE 0.71 01/20/2019 0752   CREATININE 0.66 07/08/2017 1127   CALCIUM 9.4 08/17/2019 0831   PROT 6.5 08/17/2019 0831   ALBUMIN 4.1 08/17/2019 0831   AST 26 08/17/2019 0831   AST 42 (H) 01/20/2019 0752   ALT 26 08/17/2019 0831   ALT 42 01/20/2019 0752   ALKPHOS 110 08/17/2019 0831   BILITOT 0.7 08/17/2019 0831   BILITOT 0.5  01/20/2019 0752   GFRNONAA >60 08/17/2019 0831   GFRNONAA >60 01/20/2019 0752   GFRNONAA 98 07/08/2017 1127   GFRAA >60 08/17/2019 0831   GFRAA >60 01/20/2019 0752   GFRAA 114 07/08/2017 1127    No results found for: TOTALPROTELP, ALBUMINELP, A1GS, A2GS, BETS, BETA2SER, GAMS, MSPIKE, SPEI  No results found for: KPAFRELGTCHN, LAMBDASER, KAPLAMBRATIO  Lab Results  Component Value Date   WBC 7.3 09/07/2019   NEUTROABS 5.7 09/07/2019   HGB 13.2 09/07/2019   HCT 37.3 09/07/2019   MCV 89.4 09/07/2019   PLT 214 09/07/2019    No results found for: LABCA2  No components found for: QIONGE952  No results for input(s): INR in the last 168 hours.  No results found for: LABCA2  No results found for: WUX324  No results found for: MWN027  No results found for: OZD664  No results found for: CA2729  No components found for: HGQUANT  No results found for: CEA1 / No results found for: CEA1   No results found for: AFPTUMOR  No results found for: CHROMOGRNA  No results found for: HGBA, HGBA2QUANT, HGBFQUANT, HGBSQUAN (Hemoglobinopathy evaluation)   No results found for: LDH  Lab Results  Component Value Date   IRON 46 05/11/2013   TIBC 254 05/11/2013   IRONPCTSAT 18 (L) 05/11/2013   (Iron and TIBC)  Lab Results  Component Value Date   FERRITIN 72 05/11/2013    Urinalysis    Component Value Date/Time   BILIRUBINUR Small 11/09/2018 0914   PROTEINUR Negative 11/09/2018 0914   UROBILINOGEN 0.2 11/09/2018 0914   NITRITE Negative 11/09/2018 0914   LEUKOCYTESUR Negative 11/09/2018 0914     STUDIES:  No results found.   ELIGIBLE FOR AVAILABLE RESEARCH PROTOCOL: no   ASSESSMENT: 59 y.o. Debbie [name], [place] woman status post right breast upper inner quadrant biopsy 10/28/2018 for a clinical T2 N1, stage IIA invasive ductal carcinoma, grade 2, estrogen receptor positive, progesterone receptor negative, HER-2 amplified, with an MIB-1 of 20%  (a) staging work-up with  CT  scans of the chest abdomen and pelvis and thyroid ultrasonography shows multiple findings that will require follow-up:   (i) two 0.5 cm liver lesions   (ii) possible bladder mass, thickened sigmoid and rectum, and prominent adnexa   (iii) thyroid nodule biopsied 11/19/2018, read as Bethesda 3 (risk of malignancy 5-15%)   (iiii)  thyroid lymph node biopsied 11/19/2018 showing no evidence of carcinoma   (iiiii) abd MRI 05/07 2020 confirms two tiny liver lesions. No bladder or adnexal malignancy  (1) neoadjuvant chemotherapy consisting of carboplatin, docetaxel, trastuzumab, and pertuzumab every 21 days x 6 started 11/17/2018, copleted 03/02/2019  (a) pertuzumab discontinued after cycle 1 due to severe diarrhea  (2) trastuzumab continued to complete 1 year (through March 2021).  (a) baseline echocardiogram 11/11/2018 showed an ejection fraction in the 55-60% range  (b) echocardiogram 03/10/2019 shows EF of 60-65%  (c) echo 06/08/2019 shows EF 60%  (d) echo 09/14/2018  (3) Right breast lumpectomy on 04/15/2019: no residual carcinoma (ypT0 ypN0)  (a) a total of 4 lymph nodes were removed  (4) adjuvant radiation 05/31/2019 - 07/14/2019 Site Technique Total Dose (Gy) Dose per Fx (Gy) Completed Fx Beam Energies  Breast: Breast_Rt 3D 50.4/50.4 1.8 28/28 6X, 10X  Breast: Breast_Rt_axilla 3D 45/45 1.8 25/25 6X, 10X   (5) to start tamoxifen 08/20/2019  (a) s/p hysterectomy  (b) bone density 07/30/2019 shows a T score of -1.8   (c) used oral contraceptives for 7 or 8 years in the past with no complications  (6) referral to thyroid surgeon at the completion of adjuvant radiation  (7) very small liver abnormalities will require follow-up but are almost certainly benign   PLAN: Debbie [name] continues on Trastuzumab every three weeks for her HER-2 positive breast cancer.  She is tolerating this well and will continue on this until she has received this for one years time.   She also continues on Tamoxifen  daily and has been tolerating this pretty well since starting it.  She is working on the weight gain by counting calories.  I recommended exercise as she is able.    Back in April of last year, Debbie [name] had an FNA of thyroid nodule.  This was read as atypia, bethesda 3, which has a 5-15% risk of malignancy.  She has completed radiation therapy.  She is ready for referral to surgery.  I sent  Debbie [name] a message about this to see when he could get Debbie [name] in to discuss, and also, if he could look at a cyst on her left forearm that is bothering her.    I recommended that Debbie [name] apply her radiation cream or vitamin E oil to her right breast and gently massage the area to help with the scar tissue in her breast.  She plans on doing this.    Daniyah will undergo repeat echocardiogram and f/u with Dr. Haroldine Laws on 09/15/2019.  She will return in 3 weeks for labs, Trastuzumab, and in 6 weeks for labs, f/u with Dr. Jana Hakim, and her final Trastuzumab.  She was recommended to continue with the appropriate pandemic precautions. She knows to call for any questions that may arise between now and her next appointment.  We are happy to see her sooner if needed.  Total time this encounter: 30 minutes.  Wilber Bihari, NP Medical Oncology and Hematology [facility] [place], [place] [address] Tel. [phone]    Fax. [phone]

## 2019-09-07 ENCOUNTER — Other Ambulatory Visit: Payer: Self-pay

## 2019-09-07 ENCOUNTER — Inpatient Hospital Stay: Payer: BC Managed Care – PPO

## 2019-09-07 ENCOUNTER — Inpatient Hospital Stay: Payer: BC Managed Care – PPO | Attending: Oncology

## 2019-09-07 ENCOUNTER — Encounter: Payer: Self-pay | Admitting: *Deleted

## 2019-09-07 ENCOUNTER — Encounter: Payer: Self-pay | Admitting: Adult Health

## 2019-09-07 ENCOUNTER — Inpatient Hospital Stay: Payer: BC Managed Care – PPO | Admitting: Adult Health

## 2019-09-07 VITALS — BP 154/76 | HR 77 | Temp 98.7°F | Resp 18 | Ht 66.0 in | Wt 187.7 lb

## 2019-09-07 DIAGNOSIS — K219 Gastro-esophageal reflux disease without esophagitis: Secondary | ICD-10-CM | POA: Insufficient documentation

## 2019-09-07 DIAGNOSIS — G4730 Sleep apnea, unspecified: Secondary | ICD-10-CM | POA: Insufficient documentation

## 2019-09-07 DIAGNOSIS — Z5112 Encounter for antineoplastic immunotherapy: Secondary | ICD-10-CM | POA: Insufficient documentation

## 2019-09-07 DIAGNOSIS — I10 Essential (primary) hypertension: Secondary | ICD-10-CM | POA: Diagnosis not present

## 2019-09-07 DIAGNOSIS — C50211 Malignant neoplasm of upper-inner quadrant of right female breast: Secondary | ICD-10-CM

## 2019-09-07 DIAGNOSIS — J45909 Unspecified asthma, uncomplicated: Secondary | ICD-10-CM | POA: Insufficient documentation

## 2019-09-07 DIAGNOSIS — Z79810 Long term (current) use of selective estrogen receptor modulators (SERMs): Secondary | ICD-10-CM | POA: Insufficient documentation

## 2019-09-07 DIAGNOSIS — Z170 Estrogen receptor positive status [ER+]: Secondary | ICD-10-CM

## 2019-09-07 DIAGNOSIS — Z79899 Other long term (current) drug therapy: Secondary | ICD-10-CM | POA: Insufficient documentation

## 2019-09-07 DIAGNOSIS — E041 Nontoxic single thyroid nodule: Secondary | ICD-10-CM | POA: Insufficient documentation

## 2019-09-07 DIAGNOSIS — K589 Irritable bowel syndrome without diarrhea: Secondary | ICD-10-CM | POA: Diagnosis not present

## 2019-09-07 DIAGNOSIS — F419 Anxiety disorder, unspecified: Secondary | ICD-10-CM | POA: Insufficient documentation

## 2019-09-07 DIAGNOSIS — Z95828 Presence of other vascular implants and grafts: Secondary | ICD-10-CM

## 2019-09-07 LAB — CBC WITH DIFFERENTIAL/PLATELET
Abs Immature Granulocytes: 0.02 10*3/uL (ref 0.00–0.07)
Basophils Absolute: 0 10*3/uL (ref 0.0–0.1)
Basophils Relative: 0 %
Eosinophils Absolute: 0.2 10*3/uL (ref 0.0–0.5)
Eosinophils Relative: 3 %
HCT: 37.3 % (ref 36.0–46.0)
Hemoglobin: 13.2 g/dL (ref 12.0–15.0)
Immature Granulocytes: 0 %
Lymphocytes Relative: 7 %
Lymphs Abs: 0.5 10*3/uL — ABNORMAL LOW (ref 0.7–4.0)
MCH: 31.7 pg (ref 26.0–34.0)
MCHC: 35.4 g/dL (ref 30.0–36.0)
MCV: 89.4 fL (ref 80.0–100.0)
Monocytes Absolute: 0.8 10*3/uL (ref 0.1–1.0)
Monocytes Relative: 12 %
Neutro Abs: 5.7 10*3/uL (ref 1.7–7.7)
Neutrophils Relative %: 78 %
Platelets: 214 10*3/uL (ref 150–400)
RBC: 4.17 MIL/uL (ref 3.87–5.11)
RDW: 12.9 % (ref 11.5–15.5)
WBC: 7.3 10*3/uL (ref 4.0–10.5)
nRBC: 0 % (ref 0.0–0.2)

## 2019-09-07 LAB — COMPREHENSIVE METABOLIC PANEL
ALT: 31 U/L (ref 0–44)
AST: 58 U/L — ABNORMAL HIGH (ref 15–41)
Albumin: 4.1 g/dL (ref 3.5–5.0)
Alkaline Phosphatase: 118 U/L (ref 38–126)
Anion gap: 9 (ref 5–15)
BUN: 15 mg/dL (ref 6–20)
CO2: 27 mmol/L (ref 22–32)
Calcium: 9.1 mg/dL (ref 8.9–10.3)
Chloride: 106 mmol/L (ref 98–111)
Creatinine, Ser: 0.7 mg/dL (ref 0.44–1.00)
GFR calc Af Amer: >60 mL/min (ref >60)
GFR calc non Af Amer: >60 mL/min (ref >60)
Glucose, Bld: 90 mg/dL (ref 70–99)
Potassium: 3.4 mmol/L — ABNORMAL LOW (ref 3.5–5.1)
Sodium: 142 mmol/L (ref 135–145)
Total Bilirubin: 1 mg/dL (ref 0.3–1.2)
Total Protein: 6.3 g/dL — ABNORMAL LOW (ref 6.5–8.1)

## 2019-09-07 MED ORDER — ACETAMINOPHEN 325 MG PO TABS
ORAL_TABLET | ORAL | Status: AC
  Filled 2019-09-07: qty 2

## 2019-09-07 MED ORDER — DIPHENHYDRAMINE HCL 25 MG PO CAPS
ORAL_CAPSULE | ORAL | Status: AC
  Filled 2019-09-07: qty 1

## 2019-09-07 MED ORDER — DIPHENHYDRAMINE HCL 25 MG PO CAPS
25.0000 mg | ORAL_CAPSULE | Freq: Once | ORAL | Status: AC
  Administered 2019-09-07: 10:00:00 25 mg via ORAL

## 2019-09-07 MED ORDER — HEPARIN SOD (PORK) LOCK FLUSH 100 UNIT/ML IV SOLN
500.0000 [IU] | Freq: Once | INTRAVENOUS | Status: AC | PRN
  Administered 2019-09-07: 500 [IU]
  Filled 2019-09-07: qty 5

## 2019-09-07 MED ORDER — SODIUM CHLORIDE 0.9% FLUSH
10.0000 mL | INTRAVENOUS | Status: DC | PRN
  Administered 2019-09-07: 11:00:00 10 mL
  Filled 2019-09-07: qty 10

## 2019-09-07 MED ORDER — TRASTUZUMAB-DKST CHEMO 150 MG IV SOLR
6.0000 mg/kg | Freq: Once | INTRAVENOUS | Status: AC
  Administered 2019-09-07: 504 mg via INTRAVENOUS
  Filled 2019-09-07: qty 24

## 2019-09-07 MED ORDER — ACETAMINOPHEN 325 MG PO TABS
650.0000 mg | ORAL_TABLET | Freq: Once | ORAL | Status: AC
  Administered 2019-09-07: 650 mg via ORAL

## 2019-09-07 MED ORDER — SODIUM CHLORIDE 0.9 % IV SOLN
Freq: Once | INTRAVENOUS | Status: AC
  Filled 2019-09-07: qty 250

## 2019-09-07 MED ORDER — SODIUM CHLORIDE 0.9% FLUSH
10.0000 mL | Freq: Once | INTRAVENOUS | Status: AC
  Administered 2019-09-07: 10 mL
  Filled 2019-09-07: qty 10

## 2019-09-07 NOTE — Patient Instructions (Signed)

## 2019-09-08 DIAGNOSIS — G4733 Obstructive sleep apnea (adult) (pediatric): Secondary | ICD-10-CM | POA: Diagnosis not present

## 2019-09-10 ENCOUNTER — Encounter: Payer: Self-pay | Admitting: Oncology

## 2019-09-15 ENCOUNTER — Ambulatory Visit (HOSPITAL_BASED_OUTPATIENT_CLINIC_OR_DEPARTMENT_OTHER)
Admission: RE | Admit: 2019-09-15 | Discharge: 2019-09-15 | Disposition: A | Discharge status: Home / Self Care | Payer: BC Managed Care – PPO | Source: Ambulatory Visit | Attending: Internal Medicine | Admitting: Internal Medicine

## 2019-09-15 ENCOUNTER — Encounter (HOSPITAL_COMMUNITY): Payer: Self-pay | Admitting: Internal Medicine

## 2019-09-15 ENCOUNTER — Other Ambulatory Visit: Payer: Self-pay

## 2019-09-15 ENCOUNTER — Ambulatory Visit (HOSPITAL_COMMUNITY)
Admission: RE | Admit: 2019-09-15 | Discharge: 2019-09-15 | Disposition: A | Discharge status: Home / Self Care | Payer: BC Managed Care – PPO | Source: Ambulatory Visit | Attending: Internal Medicine | Admitting: Internal Medicine

## 2019-09-15 VITALS — BP 122/71 | HR 68 | Wt 185.6 lb

## 2019-09-15 DIAGNOSIS — Z880 Allergy status to penicillin: Secondary | ICD-10-CM | POA: Diagnosis not present

## 2019-09-15 DIAGNOSIS — Z170 Estrogen receptor positive status [ER+]: Secondary | ICD-10-CM | POA: Diagnosis not present

## 2019-09-15 DIAGNOSIS — Z8249 Family history of ischemic heart disease and other diseases of the circulatory system: Secondary | ICD-10-CM | POA: Diagnosis not present

## 2019-09-15 DIAGNOSIS — Z79899 Other long term (current) drug therapy: Secondary | ICD-10-CM | POA: Diagnosis not present

## 2019-09-15 DIAGNOSIS — K219 Gastro-esophageal reflux disease without esophagitis: Secondary | ICD-10-CM | POA: Insufficient documentation

## 2019-09-15 DIAGNOSIS — I10 Essential (primary) hypertension: Secondary | ICD-10-CM

## 2019-09-15 DIAGNOSIS — G4730 Sleep apnea, unspecified: Secondary | ICD-10-CM | POA: Insufficient documentation

## 2019-09-15 DIAGNOSIS — J45909 Unspecified asthma, uncomplicated: Secondary | ICD-10-CM | POA: Insufficient documentation

## 2019-09-15 DIAGNOSIS — C50211 Malignant neoplasm of upper-inner quadrant of right female breast: Secondary | ICD-10-CM

## 2019-09-15 DIAGNOSIS — Z881 Allergy status to other antibiotic agents status: Secondary | ICD-10-CM | POA: Diagnosis not present

## 2019-09-15 NOTE — Progress Notes (Signed)
  Echocardiogram 2D Echocardiogram has been performed.  Bobbye Charleston 09/15/2019, 1:40 PM

## 2019-09-15 NOTE — Addendum Note (Signed)
Encounter addended by: Marlise Eves, RN on: 09/15/2019 2:27 PM  Actions taken: Order list changed, Diagnosis association updated, Clinical Note Signed

## 2019-09-15 NOTE — Patient Instructions (Signed)
Your physician has requested that you have an echocardiogram. Echocardiography is a painless test that uses sound waves to create images of your heart. It provides your doctor with information about the size and shape of your heart and how well your heart's chambers and valves are working. This procedure takes approximately one hour. There are no restrictions for this procedure. This will be done at your follow up appointment.   Please follow up with the [facility] in April 2021.  At the [facility], you and your health needs are our priority. As part of our continuing mission to provide you with exceptional heart care, we have created designated Provider Care Teams. These Care Teams include your primary Cardiologist (physician) and Advanced Practice Providers (APPs- Physician Assistants and Nurse Practitioners) who all work together to provide you with the care you need, when you need it.   You may see any of the following providers on your designated Care Team at your next follow up: Marland Kitchen Dr Glori Bickers . Dr Loralie Champagne . Darrick Grinder, NP . Lyda Jester, PA . Audry Riles, PharmD   Please be sure to bring in all your medications bottles to every appointment.

## 2019-09-15 NOTE — Progress Notes (Signed)
CARDIO-ONCOLOGY CLINIC NOTE  Referring Physician: [facility]:   HPI:  Debbie [name] is a 59 y.o. female with right breast cancer referred by Dr. Jana Hakim for enrollment into the Cardio-Oncology program.   Underwent right breast biopsy in 10/28/2018 for a clinical T2 N1, stage IIA invasive ductal carcinoma, grade 2, estrogen receptor positive, progesterone receptor negative, HER-2 amplified, with an MIB-1 of 20%             (a) staging work-up with CT scans of the chest abdomen and pelvis and thyroid ultrasonography shows multiple findings that will require follow-up:                         (i) two 0.5 cm liver lesions                         (ii) possible bladder mass, thickened sigmoid and rectum, and prominent adnexa                         (iii) thyroid nodule biopsied 11/19/2018, read as [facility] 3                          (iiii)  thyroid lymph node biopsied 11/19/2018 showing no evidence of carcinoma  (1) neoadjuvant chemotherapy will consist of carboplatin, docetaxel, trastuzumab, and Pertuzumab every 21 days x 6 starting 11/17/2018             (a) pertuzumab discontinued after cycle 1 due to severe diarrhea  (2) trastuzumab will be continued to complete 1 year  (3) Apr 15, 2019 lumpectomy with LN removal  (4) XRT in progress  (5) antiestrogens at the completion of local treatment  Here for f/u. Continues on herceptin. Doing well. More active No HF symptoms. BP improved.   Echo today 09/15/19 EF 60-65% Grade 2 DD GLS - 23.5% Personally reviewe   Echo 06/08/19: EF 60% GLS -22.3% ECHO: 03/10/19  EF 60% GLS -23.2%   Past Medical History:  Diagnosis Date  . Anxiety   . Asthma    with severe URIs  . Bronchitis   . Essential hypertension, benign 12/01/2012  . Generalized anxiety disorder 12/01/2012  . GERD (gastroesophageal reflux disease)   . Hypertension    lost over 100lbs, no BP meds now, HCTZ is for edema  . Irritable bowel syndrome (IBS)    "had for  years," per patient  . PONV (postoperative nausea and vomiting)   . Seasonal allergies   . Sleep apnea    uses CPAP nightly    Current Outpatient Medications  Medication Sig Dispense Refill  . AMBULATORY NON FORMULARY MEDICATION Continuous positive airway pressure (CPAP) machine set at autopap, with all supplemental supplies as needed. 1 each 0  . calcium carbonate (OSCAL) 1500 (600 Ca) MG TABS tablet Take by mouth 2 (two) times daily with a meal.    . calcium-vitamin D (OSCAL WITH D) 500-200 MG-UNIT tablet Take 1 tablet by mouth daily with breakfast.    . hydrochlorothiazide (HYDRODIURIL) 12.5 MG tablet TAKE 1 TABLET DAILY (NEED APPOINTMENT) 30 tablet 0  . loratadine (CLARITIN) 10 MG tablet Take 10 mg by mouth daily as needed.     Marland Kitchen losartan (COZAAR) 50 MG tablet Take 1 tablet (50 mg total) by mouth daily. 30 tablet 6  . montelukast (SINGULAIR) 10 MG tablet TAKE 1 TABLET AT BEDTIME 90 tablet  3  . Multiple Vitamin (MULTIVITAMIN WITH MINERALS) TABS tablet Take 1 tablet by mouth daily.    . pantoprazole (PROTONIX) 40 MG tablet Take 1 tablet (40 mg total) by mouth daily. 90 tablet 4  . potassium chloride (MICRO-K) 10 MEQ CR capsule Take 2 capsules (20 mEq total) by mouth daily. 180 capsule 2  . PROAIR HFA 108 (90 Base) MCG/ACT inhaler Inhale 2 puffs into the lungs every 4 (four) hours as needed for wheezing or shortness of breath.    . tamoxifen (NOLVADEX) 20 MG tablet Take 1 tablet (20 mg total) by mouth daily. 90 tablet 12  . valACYclovir (VALTREX) 1000 MG tablet Take 1 tablet (1,000 mg total) by mouth 2 (two) times daily as needed (cold sore). 60 tablet 1  . venlafaxine XR (EFFEXOR-XR) 37.5 MG 24 hr capsule Take 1 capsule (37.5 mg total) by mouth daily with breakfast. 90 capsule 4  . zolpidem (AMBIEN) 5 MG tablet Take 1 tablet (5 mg total) by mouth at bedtime as needed for sleep. 20 tablet 0   No current facility-administered medications for this encounter.    Allergies  Allergen  Reactions  . Azithromycin Diarrhea  . Erythromycin Itching  . Erythromycin Base Itching  . Levofloxacin     Tendon pain  . Penicillin G Rash, Swelling and Hives  . Penicillins Swelling and Rash    Did it involve swelling of the face/tongue/throat, SOB, or low BP? Yes Did it involve sudden or severe rash/hives, skin peeling, or any reaction on the inside of your mouth or nose? No Did you need to seek medical attention at a hospital or doctor's office? Yes When did it last happen?40+ years ago If all above answers are "NO", may proceed with cephalosporin use.       Social History   Socioeconomic History  . Marital status: Married    Spouse name: [name]  . Number of children: 2  . Years of education: Not on file  . Highest education level: Not on file  Occupational History  . Not on file  Tobacco Use  . Smoking status: Never Smoker  . Smokeless tobacco: Never Used  Substance and Sexual Activity  . Alcohol use: Yes    Comment: rare  . Drug use: No  . Sexual activity: Not Currently  Other Topics Concern  . Not on file  Social History Narrative  . Not on file   Social Determinants of Health   Financial Resource Strain:   . Difficulty of Paying Living Expenses: Not on file  Food Insecurity:   . Worried About Charity fundraiser in the Last Year: Not on file  . Ran Out of Food in the Last Year: Not on file  Transportation Needs:   . Lack of Transportation (Medical): Not on file  . Lack of Transportation (Non-Medical): Not on file  Physical Activity:   . Days of Exercise per Week: Not on file  . Minutes of Exercise per Session: Not on file  Stress:   . Feeling of Stress : Not on file  Social Connections:   . Frequency of Communication with Friends and Family: Not on file  . Frequency of Social Gatherings with Friends and Family: Not on file  . Attends Religious Services: Not on file  . Active Member of Clubs or Organizations: Not on file  . Attends Theatre manager Meetings: Not on file  . Marital Status: Not on file  Intimate Partner Violence:   . Fear of  Current or Ex-Partner: Not on file  . Emotionally Abused: Not on file  . Physically Abused: Not on file  . Sexually Abused: Not on file      Family History  Problem Relation Age of Onset  . Hypertension Mother   . Rheum arthritis Mother   . Heart failure Mother   . Hypertension Father   . Alcoholism Father   . Colon cancer Father   . Lung cancer Father   . Depression Father   . Diabetes Father   . Skin cancer Father   . Hypertension Brother   . Stroke Brother   . Atrial fibrillation Brother   . Hyperlipidemia Maternal Grandfather   . Stroke Paternal Grandmother   . Skin cancer Paternal Grandmother   . Healthy Daughter   . Healthy Daughter   . Breast cancer Neg Hx   . Ovarian cancer Neg Hx   . Prostate cancer Neg Hx   . Pancreatic cancer Neg Hx     Vitals:   09/15/19 1408  BP: 122/71  Pulse: 68  SpO2: 99%  Weight: 84.2 kg (185 lb 9.6 oz)    PHYSICAL EXAM: General:  Well appearing. No resp difficulty HEENT: normal Neck: supple. no JVD. Carotids 2+ bilat; no bruits. No lymphadenopathy or thryomegaly appreciated. Cor: PMI nondisplaced. Regular rate & rhythm. 2/6 SEM RSB. + port Lungs: clear Abdomen: obese soft, nontender, nondistended. No hepatosplenomegaly. No bruits or masses. Good bowel sounds. Extremities: no cyanosis, clubbing, rash, edema Neuro: alert & orientedx3, cranial nerves grossly intact. moves all 4 extremities w/o difficulty. Affect pleasant   ASSESSMENT & PLAN:  1. Breast Cancer - I reviewed echos personally. EF and Doppler parameters stable. No HF on exam. Continue Herceptin. (Will finish at end of March).   2. HTN  - BP improved. Continue losartan.   3. Mild coronary calcium in LAD on chest CT - asymptomatic - recommend statin as needed to keep LDL < 100  Glori Bickers, MD  1:52 PM

## 2019-09-28 ENCOUNTER — Inpatient Hospital Stay: Payer: BC Managed Care – PPO | Attending: Oncology

## 2019-09-28 ENCOUNTER — Other Ambulatory Visit: Payer: Self-pay

## 2019-09-28 ENCOUNTER — Inpatient Hospital Stay: Payer: BC Managed Care – PPO

## 2019-09-28 VITALS — BP 154/77 | HR 76 | Temp 98.2°F | Resp 18 | Wt 188.5 lb

## 2019-09-28 DIAGNOSIS — C50211 Malignant neoplasm of upper-inner quadrant of right female breast: Secondary | ICD-10-CM | POA: Insufficient documentation

## 2019-09-28 DIAGNOSIS — Z170 Estrogen receptor positive status [ER+]: Secondary | ICD-10-CM | POA: Diagnosis not present

## 2019-09-28 DIAGNOSIS — Z5112 Encounter for antineoplastic immunotherapy: Secondary | ICD-10-CM | POA: Diagnosis not present

## 2019-09-28 LAB — CBC WITH DIFFERENTIAL/PLATELET
Abs Immature Granulocytes: 0.01 10*3/uL (ref 0.00–0.07)
Basophils Absolute: 0 10*3/uL (ref 0.0–0.1)
Basophils Relative: 0 %
Eosinophils Absolute: 0.2 10*3/uL (ref 0.0–0.5)
Eosinophils Relative: 6 %
HCT: 36 % (ref 36.0–46.0)
Hemoglobin: 12.8 g/dL (ref 12.0–15.0)
Immature Granulocytes: 0 %
Lymphocytes Relative: 15 %
Lymphs Abs: 0.6 10*3/uL — ABNORMAL LOW (ref 0.7–4.0)
MCH: 31.3 pg (ref 26.0–34.0)
MCHC: 35.6 g/dL (ref 30.0–36.0)
MCV: 88 fL (ref 80.0–100.0)
Monocytes Absolute: 0.4 10*3/uL (ref 0.1–1.0)
Monocytes Relative: 11 %
Neutro Abs: 2.7 10*3/uL (ref 1.7–7.7)
Neutrophils Relative %: 68 %
Platelets: 174 10*3/uL (ref 150–400)
RBC: 4.09 MIL/uL (ref 3.87–5.11)
RDW: 11.9 % (ref 11.5–15.5)
WBC: 4 10*3/uL (ref 4.0–10.5)
nRBC: 0 % (ref 0.0–0.2)

## 2019-09-28 LAB — COMPREHENSIVE METABOLIC PANEL
ALT: 11 U/L (ref 0–44)
AST: 17 U/L (ref 15–41)
Albumin: 3.9 g/dL (ref 3.5–5.0)
Alkaline Phosphatase: 89 U/L (ref 38–126)
Anion gap: 8 (ref 5–15)
BUN: 15 mg/dL (ref 6–20)
CO2: 28 mmol/L (ref 22–32)
Calcium: 9.2 mg/dL (ref 8.9–10.3)
Chloride: 107 mmol/L (ref 98–111)
Creatinine, Ser: 0.71 mg/dL (ref 0.44–1.00)
GFR calc Af Amer: >60 mL/min (ref >60)
GFR calc non Af Amer: >60 mL/min (ref >60)
Glucose, Bld: 90 mg/dL (ref 70–99)
Potassium: 3.4 mmol/L — ABNORMAL LOW (ref 3.5–5.1)
Sodium: 143 mmol/L (ref 135–145)
Total Bilirubin: 0.7 mg/dL (ref 0.3–1.2)
Total Protein: 6.1 g/dL — ABNORMAL LOW (ref 6.5–8.1)

## 2019-09-28 MED ORDER — DIPHENHYDRAMINE HCL 25 MG PO CAPS
ORAL_CAPSULE | ORAL | Status: AC
  Filled 2019-09-28: qty 1

## 2019-09-28 MED ORDER — ACETAMINOPHEN 325 MG PO TABS
ORAL_TABLET | ORAL | Status: AC
  Filled 2019-09-28: qty 2

## 2019-09-28 MED ORDER — SODIUM CHLORIDE 0.9 % IV SOLN
Freq: Once | INTRAVENOUS | Status: AC
  Filled 2019-09-28: qty 250

## 2019-09-28 MED ORDER — ACETAMINOPHEN 325 MG PO TABS
650.0000 mg | ORAL_TABLET | Freq: Once | ORAL | Status: AC
  Administered 2019-09-28: 650 mg via ORAL

## 2019-09-28 MED ORDER — DIPHENHYDRAMINE HCL 25 MG PO CAPS
25.0000 mg | ORAL_CAPSULE | Freq: Once | ORAL | Status: AC
  Administered 2019-09-28: 25 mg via ORAL

## 2019-09-28 MED ORDER — SODIUM CHLORIDE 0.9% FLUSH
10.0000 mL | INTRAVENOUS | Status: DC | PRN
  Administered 2019-09-28: 10 mL
  Filled 2019-09-28: qty 10

## 2019-09-28 MED ORDER — TRASTUZUMAB-DKST CHEMO 150 MG IV SOLR
6.0000 mg/kg | Freq: Once | INTRAVENOUS | Status: AC
  Administered 2019-09-28: 504 mg via INTRAVENOUS
  Filled 2019-09-28: qty 24

## 2019-09-28 MED ORDER — HEPARIN SOD (PORK) LOCK FLUSH 100 UNIT/ML IV SOLN
500.0000 [IU] | Freq: Once | INTRAVENOUS | Status: AC | PRN
  Administered 2019-09-28: 500 [IU]
  Filled 2019-09-28: qty 5

## 2019-09-28 NOTE — Patient Instructions (Signed)
[facility] Discharge Instructions for Patients Receiving Chemotherapy  Today you received the following chemotherapy agents Trastuzumab-dkst  To help prevent nausea and vomiting after your treatment, we encourage you to take your nausea medication as directed.    If you develop nausea and vomiting that is not controlled by your nausea medication, call the clinic.   BELOW ARE SYMPTOMS THAT SHOULD BE REPORTED IMMEDIATELY:  *FEVER GREATER THAN 100.5 F  *CHILLS WITH OR WITHOUT FEVER  NAUSEA AND VOMITING THAT IS NOT CONTROLLED WITH YOUR NAUSEA MEDICATION  *UNUSUAL SHORTNESS OF BREATH  *UNUSUAL BRUISING OR BLEEDING  TENDERNESS IN MOUTH AND THROAT WITH OR WITHOUT PRESENCE OF ULCERS  *URINARY PROBLEMS  *BOWEL PROBLEMS  UNUSUAL RASH Items with * indicate a potential emergency and should be followed up as soon as possible.  Feel free to call the clinic should you have any questions or concerns. The clinic phone number is [phone].  Please show the [facility] at check-in to the Emergency Department and triage nurse.

## 2019-10-12 DIAGNOSIS — E042 Nontoxic multinodular goiter: Secondary | ICD-10-CM | POA: Diagnosis not present

## 2019-10-13 ENCOUNTER — Other Ambulatory Visit: Payer: Self-pay | Admitting: Surgery

## 2019-10-13 DIAGNOSIS — E042 Nontoxic multinodular goiter: Secondary | ICD-10-CM

## 2019-10-14 DIAGNOSIS — Z713 Dietary counseling and surveillance: Secondary | ICD-10-CM | POA: Diagnosis not present

## 2019-10-18 NOTE — Progress Notes (Signed)
Debbie [name]  [phone] Fax:([phone]) [phone]    ID: [name] DOB: January 30, 1961  MR#: [id number]  GMW#:[id number]  Patient Care Team: Lavada Mesi as PCP - General (Family Medicine) Alphonsa Overall, MD as Consulting Physician (General Surgery) Napolean Sia, Virgie Dad, MD as Consulting Physician (Oncology) Gery Pray, MD as Consulting Physician (Radiation Oncology) Burden, Lincoln Brigham, MD as Referring Physician (Ophthalmology) Larey Dresser, MD as Consulting Physician (Cardiology) Mauro Kaufmann, RN as Oncology Nurse Navigator Rockwell Germany, RN as Oncology Nurse Navigator OTHER MD: Earl Lagos, MD (Ophthalmology)   CHIEF COMPLAINT: HER-2 positive breast cancer  CURRENT TREATMENT: Completing maintenance Trastuzumab;  tamoxifen   INTERVAL HISTORY: Debbie [name] returns today for follow-up and treatment of her HER-2 positive breast cancer.   She is due for her final Trastuzumab today.  She has had no problems or complications from this medication.  Echocardiogram 09/15/2019 showed an ejection fraction in the 60-65% range.  She also continues on Tamoxifen.  She has minimal problems with hot flashes and vaginal dryness is not an issue.  REVIEW OF SYSTEMS: Beauty tells me her hands and feet sometimes swell.  She is on a water pill for this.  She tends to take it in the evening which then causes nocturia.  She is eager to have her Covid vaccines.  For exercise she walks 2-3 times a week, 15 to 20 minutes at a time.  A detailed review of systems today was otherwise noncontributory   HISTORY OF CURRENT ILLNESS: From the original intake note:  Debbie [name] had routine screening mammography on 10/21/2018 showing a possible abnormality in the right breast. She felt a lump in her breast, possibly dating back to 01/2018, but she didn't think anything about it due to her history with fibrous breasts; she has had fibrous breasts all her life  and began to have mammograms at the age of 59.   She underwent right breast ultrasonography at [facility] on 10/28/2018 showing: On physical exam, a firm, fixed mass is palpated in the superior right breast. Targeted ultrasound is performed, showing an irregular hypoechoic mass at the 1 o'clock position 4 cm from the nipple. It measures 2.8 x 2.4 x 1.8 cm. There is associated peripheral and internal vascularity. An oval, circumscribed hypoechoic mass is also identified within the vicinity at the 1 o'clock position 4 cm from the nipple. It measures 1.0 x 0.9 x 0.4 cm. This corresponds with an additional mammographically identified mass in the upper inner quadrant at middle depth. This is mammographically stable dating back to at least 2014. Additional stable, circumscribed masses are scattered throughout the bilateral breasts mammographically. Evaluation of the right axilla demonstrates a single, markedly abnormal lymph node with cortical thickening up to 1 cm. No additional suspicious lymphadenopathy is identified.  Accordingly on 10/28/2018 she proceeded to biopsy of the right breast area in question. The pathology from this procedure showed ([id number]): invasive ductal carcinoma, nottingham grade II of III, 2.8 cm, 1 o'clock, 4.0 cm from the nipple. Prognostic indicators significant for: estrogen receptor, 90% positive, with strong staining intensity and progesterone receptor, 0% negative. Proliferation marker Ki67 at 20%. HER2 Positive (3+) by immunohistochemistry.   An additional biopsy of the right axilla was performed on the same day ([id number]) showing:  2. Lymph node, needle/core biopsy, inferior right axilla - Tumor cells within fragmented nodal tissue  The patient's subsequent history is as detailed above.   PAST MEDICAL HISTORY: Past Medical History:  Diagnosis Date  . Anxiety   . Asthma    with severe URIs  . Bronchitis   . Essential hypertension, benign 12/01/2012  .  Generalized anxiety disorder 12/01/2012  . GERD (gastroesophageal reflux disease)   . Hypertension    lost over 100lbs, no BP meds now, HCTZ is for edema  . Irritable bowel syndrome (IBS)    "had for years," per patient  . PONV (postoperative nausea and vomiting)   . Seasonal allergies   . Sleep apnea    uses CPAP nightly    PAST SURGICAL HISTORY: Past Surgical History:  Procedure Laterality Date  . ABDOMINAL HYSTERECTOMY  2011   with vaginal sling  . ANKLE SURGERY    . BREAST CYST EXCISION Bilateral   . BREAST EXCISIONAL BIOPSY Bilateral   . BREAST LUMPECTOMY WITH RADIOACTIVE SEED AND AXILLARY LYMPH NODE DISSECTION Right 04/15/2019   Procedure: RIGHT BREAST LUMPECTOMY WITH RADIOACTIVE SEED AND  TARGETED RIGHT AXILLARY LYMPH NODE DISSECTION WITH RADIOACTIVE SEED;  Surgeon: Alphonsa Overall, MD;  Location: [facility];  Service: General;  Laterality: Right;  . CHOLECYSTECTOMY  2012  . KNEE ARTHROSCOPY W/ MEDIAL COLLATERAL LIGAMENT (MCL) REPAIR Left 10-07-2012  . PORTACATH PLACEMENT N/A 11/16/2018   Procedure: INSERTION PORT-A-CATH WITH ULTRASOUND;  Surgeon: Alphonsa Overall, MD;  Location: [facility];  Service: General;  Laterality: N/A;  . TONSILLECTOMY  1969    FAMILY HISTORY: Family History  Problem Relation Age of Onset  . Hypertension Mother   . Rheum arthritis Mother   . Heart failure Mother   . Hypertension Father   . Alcoholism Father   . Colon cancer Father   . Lung cancer Father   . Depression Father   . Diabetes Father   . Skin cancer Father   . Hypertension Brother   . Stroke Brother   . Atrial fibrillation Brother   . Hyperlipidemia Maternal Grandfather   . Stroke Paternal Grandmother   . Skin cancer Paternal Grandmother   . Healthy Daughter   . Healthy Daughter   . Breast cancer Neg Hx   . Ovarian cancer Neg Hx   . Prostate cancer Neg Hx   . Pancreatic cancer Neg Hx    Jaeleen's father died from lung cancer at age 56. Patients' mother died from  congestive heart failure at age 7. The patient has 1 brother. Patient denies anyone in her family having breast, ovarian, prostate, or pancreatic cancer. Derra's father was diagnosed with skin cancer, colon cancer, and lung cancer. [name]'s paternal grandmother was diagnosed with skin cancer.    GYNECOLOGIC HISTORY:  No LMP recorded. Patient has had a hysterectomy. 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 Her husband, Trinady Milewski, is in [employer]	[place]. They have two daughters, Kerri Perches and [place]. [address] Dawna Part is 6, lives in [place], [place], and works in Building surveyor. Adrian Blackwater [place] is 20, lives in [place], [place], and is currently in school; she currently works part time as a Publishing copy.   ADVANCED DIRECTIVES: In the absence of any documentation, Merinda's spouse, [name], is her healthcare power of attorney.      HEALTH MAINTENANCE: Social History   Tobacco Use  . Smoking status: Never Smoker  .  Smokeless tobacco: Never Used  Substance Use Topics  . Alcohol use: Yes    Comment: rare  . Drug use: No    Colonoscopy: yes, 2015, [facility]   PAP: 2011  Bone density: 07/2019, -1.8 Mammography: 10/21/2018  Allergies  Allergen Reactions  . Azithromycin Diarrhea  . Erythromycin Itching  . Erythromycin Base Itching  . Levofloxacin     Tendon pain  . Penicillin G Rash, Swelling and Hives  . Penicillins Swelling and Rash    Did it involve swelling of the face/tongue/throat, SOB, or low BP? Yes Did it involve sudden or severe rash/hives, skin peeling, or any reaction on the inside of your mouth or nose? No Did you need to seek medical attention at a hospital  or doctor's office? Yes When did it last happen?40+ years ago If all above answers are "NO", may proceed with cephalosporin use.     Current Outpatient Medications  Medication Sig Dispense Refill  . AMBULATORY NON FORMULARY MEDICATION Continuous positive airway pressure (CPAP) machine set at autopap, with all supplemental supplies as needed. 1 each 0  . calcium carbonate (OSCAL) 1500 (600 Ca) MG TABS tablet Take by mouth 2 (two) times daily with a meal.    . calcium-vitamin D (OSCAL WITH D) 500-200 MG-UNIT tablet Take 1 tablet by mouth daily with breakfast.    . hydrochlorothiazide (HYDRODIURIL) 12.5 MG tablet TAKE 1 TABLET DAILY (NEED APPOINTMENT) 30 tablet 0  . loratadine (CLARITIN) 10 MG tablet Take 10 mg by mouth daily as needed.     Marland Kitchen losartan (COZAAR) 50 MG tablet Take 1 tablet (50 mg total) by mouth daily. 30 tablet 6  . montelukast (SINGULAIR) 10 MG tablet TAKE 1 TABLET AT BEDTIME 90 tablet 3  . Multiple Vitamin (MULTIVITAMIN WITH MINERALS) TABS tablet Take 1 tablet by mouth daily.    . pantoprazole (PROTONIX) 40 MG tablet Take 1 tablet (40 mg total) by mouth daily. 90 tablet 4  . potassium chloride (MICRO-K) 10 MEQ CR capsule Take 2 capsules (20 mEq total) by mouth daily. 180 capsule 2  . PROAIR HFA 108 (90 Base) MCG/ACT inhaler Inhale 2 puffs into the lungs every 4 (four) hours as needed for wheezing or shortness of breath.    . valACYclovir (VALTREX) 1000 MG tablet Take 1 tablet (1,000 mg total) by mouth 2 (two) times daily as needed (cold sore). 60 tablet 1  . venlafaxine XR (EFFEXOR-XR) 37.5 MG 24 hr capsule Take 1 capsule (37.5 mg total) by mouth daily with breakfast. 90 capsule 4  . zolpidem (AMBIEN) 5 MG tablet Take 1 tablet (5 mg total) by mouth at bedtime as needed for sleep. 20 tablet 0   No current facility-administered medications for this visit.   Facility-Administered Medications Ordered in Other Visits  Medication Dose Route Frequency Provider Last Rate Last  Admin  . 0.9 %  sodium chloride infusion   Intravenous Once Magrinat, Virgie Dad, MD      . acetaminophen (TYLENOL) tablet 650 mg  650 mg Oral Once Magrinat, Virgie Dad, MD      . diphenhydrAMINE (BENADRYL) capsule 25 mg  25 mg Oral Once Magrinat, Virgie Dad, MD      . heparin lock flush 100 unit/mL  500 Units Intracatheter Once PRN Magrinat, Virgie Dad, MD      . sodium chloride flush (NS) 0.9 % injection 10 mL  10 mL Intracatheter PRN Magrinat, Virgie Dad, MD      . trastuzumab-dkst (OGIVRI) 504 mg in sodium  chloride 0.9 % 250 mL chemo infusion  6 mg/kg (Order-Specific) Intravenous Once Anda Sobotta, Virgie Dad, MD         OBJECTIVE: Middle-aged white woman who appears younger than stated age  69:   10/19/19 1134  BP: 127/71  Pulse: 66  Resp: 18  Temp: 98.5 F (36.9 C)  SpO2: 98%     Body mass index is 30.21 kg/m.   Wt Readings from Last 3 Encounters:  10/19/19 187 lb 3.2 oz (84.9 kg)  09/28/19 188 lb 8 oz (85.5 kg)  09/15/19 185 lb 9.6 oz (84.2 kg)  ECOG FS:1 - Symptomatic but completely ambulatory  Sclerae unicteric, EOMs intact Wearing a mask No cervical or supraclavicular adenopathy Lungs no rales or rhonchi Heart regular rate and rhythm Abd soft, nontender, positive bowel sounds MSK no focal spinal tenderness, no upper extremity lymphedema Neuro: nonfocal, well oriented, appropriate affect Breasts: On the right she has undergone lumpectomy and radiation.  There is no evidence of local recurrence.  The left breast is benign.  Both axillae are benign.   LAB RESULTS:  CMP     Component Value Date/Time   NA 143 10/19/2019 1100   K 3.4 (L) 10/19/2019 1100   CL 107 10/19/2019 1100   CO2 30 10/19/2019 1100   GLUCOSE 91 10/19/2019 1100   BUN 13 10/19/2019 1100   CREATININE 0.71 10/19/2019 1100   CREATININE 0.71 01/20/2019 0752   CREATININE 0.66 07/08/2017 1127   CALCIUM 8.8 (L) 10/19/2019 1100   PROT 6.2 (L) 10/19/2019 1100   ALBUMIN 3.8 10/19/2019 1100   AST 17 10/19/2019  1100   AST 42 (H) 01/20/2019 0752   ALT 14 10/19/2019 1100   ALT 42 01/20/2019 0752   ALKPHOS 85 10/19/2019 1100   BILITOT 0.6 10/19/2019 1100   BILITOT 0.5 01/20/2019 0752   GFRNONAA >60 10/19/2019 1100   GFRNONAA >60 01/20/2019 0752   GFRNONAA 98 07/08/2017 1127   GFRAA >60 10/19/2019 1100   GFRAA >60 01/20/2019 0752   GFRAA 114 07/08/2017 1127    No results found for: TOTALPROTELP, ALBUMINELP, A1GS, A2GS, BETS, BETA2SER, GAMS, MSPIKE, SPEI  No results found for: KPAFRELGTCHN, LAMBDASER, KAPLAMBRATIO  Lab Results  Component Value Date   WBC 4.1 10/19/2019   NEUTROABS 2.8 10/19/2019   HGB 13.0 10/19/2019   HCT 37.5 10/19/2019   MCV 89.3 10/19/2019   PLT 187 10/19/2019    No results found for: LABCA2  No components found for: [id number]  No results for input(s): INR in the last 168 hours.  No results found for: LABCA2  No results found for: CMK349  No results found for: ZPH150  No results found for: VWP794  No results found for: CA2729  No components found for: HGQUANT  No results found for: CEA1 / No results found for: CEA1   No results found for: AFPTUMOR  No results found for: CHROMOGRNA  No results found for: HGBA, HGBA2QUANT, HGBFQUANT, HGBSQUAN (Hemoglobinopathy evaluation)   No results found for: LDH  Lab Results  Component Value Date   IRON 46 05/11/2013   TIBC 254 05/11/2013   IRONPCTSAT 18 (L) 05/11/2013   (Iron and TIBC)  Lab Results  Component Value Date   FERRITIN 72 05/11/2013    Urinalysis    Component Value Date/Time   BILIRUBINUR Small 11/09/2018 0914   PROTEINUR Negative 11/09/2018 0914   UROBILINOGEN 0.2 11/09/2018 0914   NITRITE Negative 11/09/2018 0914   LEUKOCYTESUR Negative 11/09/2018 0914     STUDIES:  No results found.   ELIGIBLE FOR AVAILABLE RESEARCH PROTOCOL: no   ASSESSMENT: 59 y.o. [place], [place] woman status post right breast upper inner quadrant biopsy 10/28/2018 for a clinical T2 N1, stage IIA  invasive ductal carcinoma, grade 2, estrogen receptor positive, progesterone receptor negative, HER-2 amplified, with an MIB-1 of 20%  (a) staging work-up with CT scans of the chest abdomen and pelvis and thyroid ultrasonography shows multiple findings that will require follow-up:   (i) two 0.5 cm liver lesions   (ii) possible bladder mass, thickened sigmoid and rectum, and prominent adnexa   (iii) thyroid nodule biopsied 11/19/2018, read as [place] 3 (risk of malignancy 5-15%)   (iiii)  thyroid lymph node biopsied 11/19/2018 showing no evidence of carcinoma   (iiiii) abd MRI 05/07 2020 confirms two tiny liver lesions. No bladder or adnexal malignancy  (1) neoadjuvant chemotherapy consisting of carboplatin, docetaxel, trastuzumab, and pertuzumab every 21 days x 6 started 11/17/2018, copleted 03/02/2019  (a) pertuzumab discontinued after cycle 1 due to severe diarrhea  (2) trastuzumab continued to complete 1 year (through 10/19/2019).).  (a) baseline echocardiogram 11/11/2018 showed an ejection fraction in the 55-60% range  (b) echocardiogram 03/10/2019 shows EF of 60-65%  (c) echo 06/08/2019 shows EF 60%  (d) echo 09/14/2018 shows an ejection fraction in the 60-65% range.  (3) Right breast lumpectomy on 04/15/2019: no residual carcinoma (ypT0 ypN0)  (a) a total of 4 lymph nodes were removed  (4) adjuvant radiation 05/31/2019 - 07/14/2019 Site Technique Total Dose (Gy) Dose per Fx (Gy) Completed Fx Beam Energies  Breast: Breast_Rt 3D 50.4/50.4 1.8 28/28 6X, 10X  Breast: Breast_Rt_axilla 3D 45/45 1.8 25/25 6X, 10X   (5) started tamoxifen 08/20/2019  (a) s/p hysterectomy  (b) bone density 07/30/2019 shows a T score of -1.8   (c) used oral contraceptives for 7 or 8 years in the past with no complications  (6) referral to thyroid surgeon at the completion of adjuvant radiation  (7) very small liver abnormalities will require follow-up but are almost certainly benign   PLAN: Tabatha's  hair has come back strong current currently, and Frosty.  She is very pleased with it.  She understands it will last about a year after which it will go back to what she had before.  She completes her trastuzumab today.  She has tolerated this well, with no evidence of cardiac toxicity.  She can have the port removed at any point and I have sent her surgeon a note stating that.  She is tolerating tamoxifen well.  I reassured her that the little bit of ankle and hand swelling that she is experiencing is not related to the tamoxifen.  I suggested she take her hydrochlorothiazide in the morning instead of the evening.  Incidentally she still has not caught up on her potassium.  She is taking her potassium tablet twice a day.  I urged her to also have some fruit daily.  She will return to see me in May after her next mammogram  She knows to call for any other issue that may develop before the next visit.  Total encounter time 30 minutes.Sarajane Jews C. Magrinat, MD Medical Oncology and Hematology [facility] [place], [place] [address] Tel. [phone]    Fax. [phone]   I, Wilburn Mylar, am acting as scribe for Dr. Virgie Dad. Magrinat.  I, Lurline Del MD, have reviewed the above documentation for accuracy and completeness, and I agree with the above.    *  Total Encounter Time as defined by the Centers for Medicare and Medicaid Services includes, in addition to the face-to-face time of a patient visit (documented in the note above) non-face-to-face time: obtaining and reviewing outside history, ordering and reviewing medications, tests or procedures, care coordination (communications with other health care professionals or caregivers) and documentation in the medical record.

## 2019-10-19 ENCOUNTER — Inpatient Hospital Stay: Payer: BC Managed Care – PPO

## 2019-10-19 ENCOUNTER — Ambulatory Visit: Payer: BC Managed Care – PPO

## 2019-10-19 ENCOUNTER — Other Ambulatory Visit: Payer: Self-pay

## 2019-10-19 ENCOUNTER — Other Ambulatory Visit: Payer: BC Managed Care – PPO

## 2019-10-19 ENCOUNTER — Inpatient Hospital Stay: Payer: BC Managed Care – PPO | Admitting: Oncology

## 2019-10-19 ENCOUNTER — Inpatient Hospital Stay: Payer: BC Managed Care – PPO | Attending: Oncology

## 2019-10-19 ENCOUNTER — Encounter: Payer: Self-pay | Admitting: *Deleted

## 2019-10-19 VITALS — BP 127/71 | HR 66 | Temp 98.5°F | Resp 18 | Ht 66.0 in | Wt 187.2 lb

## 2019-10-19 DIAGNOSIS — Z170 Estrogen receptor positive status [ER+]: Secondary | ICD-10-CM

## 2019-10-19 DIAGNOSIS — Z5112 Encounter for antineoplastic immunotherapy: Secondary | ICD-10-CM | POA: Insufficient documentation

## 2019-10-19 DIAGNOSIS — C50211 Malignant neoplasm of upper-inner quadrant of right female breast: Secondary | ICD-10-CM

## 2019-10-19 DIAGNOSIS — Z79899 Other long term (current) drug therapy: Secondary | ICD-10-CM | POA: Diagnosis not present

## 2019-10-19 DIAGNOSIS — Z79810 Long term (current) use of selective estrogen receptor modulators (SERMs): Secondary | ICD-10-CM | POA: Diagnosis not present

## 2019-10-19 DIAGNOSIS — Z95828 Presence of other vascular implants and grafts: Secondary | ICD-10-CM

## 2019-10-19 DIAGNOSIS — Z923 Personal history of irradiation: Secondary | ICD-10-CM | POA: Insufficient documentation

## 2019-10-19 LAB — COMPREHENSIVE METABOLIC PANEL
ALT: 14 U/L (ref 0–44)
AST: 17 U/L (ref 15–41)
Albumin: 3.8 g/dL (ref 3.5–5.0)
Alkaline Phosphatase: 85 U/L (ref 38–126)
Anion gap: 6 (ref 5–15)
BUN: 13 mg/dL (ref 6–20)
CO2: 30 mmol/L (ref 22–32)
Calcium: 8.8 mg/dL — ABNORMAL LOW (ref 8.9–10.3)
Chloride: 107 mmol/L (ref 98–111)
Creatinine, Ser: 0.71 mg/dL (ref 0.44–1.00)
GFR calc Af Amer: >60 mL/min (ref >60)
GFR calc non Af Amer: >60 mL/min (ref >60)
Glucose, Bld: 91 mg/dL (ref 70–99)
Potassium: 3.4 mmol/L — ABNORMAL LOW (ref 3.5–5.1)
Sodium: 143 mmol/L (ref 135–145)
Total Bilirubin: 0.6 mg/dL (ref 0.3–1.2)
Total Protein: 6.2 g/dL — ABNORMAL LOW (ref 6.5–8.1)

## 2019-10-19 LAB — CBC WITH DIFFERENTIAL/PLATELET
Abs Immature Granulocytes: 0.01 10*3/uL (ref 0.00–0.07)
Basophils Absolute: 0 10*3/uL (ref 0.0–0.1)
Basophils Relative: 1 %
Eosinophils Absolute: 0.2 10*3/uL (ref 0.0–0.5)
Eosinophils Relative: 4 %
HCT: 37.5 % (ref 36.0–46.0)
Hemoglobin: 13 g/dL (ref 12.0–15.0)
Immature Granulocytes: 0 %
Lymphocytes Relative: 16 %
Lymphs Abs: 0.6 10*3/uL — ABNORMAL LOW (ref 0.7–4.0)
MCH: 31 pg (ref 26.0–34.0)
MCHC: 34.7 g/dL (ref 30.0–36.0)
MCV: 89.3 fL (ref 80.0–100.0)
Monocytes Absolute: 0.4 10*3/uL (ref 0.1–1.0)
Monocytes Relative: 10 %
Neutro Abs: 2.8 10*3/uL (ref 1.7–7.7)
Neutrophils Relative %: 69 %
Platelets: 187 10*3/uL (ref 150–400)
RBC: 4.2 MIL/uL (ref 3.87–5.11)
RDW: 12.1 % (ref 11.5–15.5)
WBC: 4.1 10*3/uL (ref 4.0–10.5)
nRBC: 0 % (ref 0.0–0.2)

## 2019-10-19 MED ORDER — TRASTUZUMAB-DKST CHEMO 150 MG IV SOLR
6.0000 mg/kg | Freq: Once | INTRAVENOUS | Status: AC
  Administered 2019-10-19: 504 mg via INTRAVENOUS
  Filled 2019-10-19: qty 24

## 2019-10-19 MED ORDER — ACETAMINOPHEN 325 MG PO TABS
ORAL_TABLET | ORAL | Status: AC
  Filled 2019-10-19: qty 2

## 2019-10-19 MED ORDER — ACETAMINOPHEN 325 MG PO TABS
650.0000 mg | ORAL_TABLET | Freq: Once | ORAL | Status: AC
  Administered 2019-10-19: 650 mg via ORAL

## 2019-10-19 MED ORDER — SODIUM CHLORIDE 0.9% FLUSH
10.0000 mL | Freq: Once | INTRAVENOUS | Status: AC
  Administered 2019-10-19: 10 mL
  Filled 2019-10-19: qty 10

## 2019-10-19 MED ORDER — SODIUM CHLORIDE 0.9% FLUSH
10.0000 mL | INTRAVENOUS | Status: DC | PRN
  Administered 2019-10-19: 10 mL
  Filled 2019-10-19: qty 10

## 2019-10-19 MED ORDER — DIPHENHYDRAMINE HCL 25 MG PO CAPS
ORAL_CAPSULE | ORAL | Status: AC
  Filled 2019-10-19: qty 1

## 2019-10-19 MED ORDER — SODIUM CHLORIDE 0.9 % IV SOLN
Freq: Once | INTRAVENOUS | Status: AC
  Filled 2019-10-19: qty 250

## 2019-10-19 MED ORDER — DIPHENHYDRAMINE HCL 25 MG PO CAPS
25.0000 mg | ORAL_CAPSULE | Freq: Once | ORAL | Status: AC
  Administered 2019-10-19: 25 mg via ORAL

## 2019-10-19 MED ORDER — HEPARIN SOD (PORK) LOCK FLUSH 100 UNIT/ML IV SOLN
500.0000 [IU] | Freq: Once | INTRAVENOUS | Status: AC | PRN
  Administered 2019-10-19: 500 [IU]
  Filled 2019-10-19: qty 5

## 2019-10-19 NOTE — Patient Instructions (Signed)
[facility] Discharge Instructions for Patients Receiving Chemotherapy  Today you received the following chemotherapy agents Trastuzumab-dkst  To help prevent nausea and vomiting after your treatment, we encourage you to take your nausea medication as directed.    If you develop nausea and vomiting that is not controlled by your nausea medication, call the clinic.   BELOW ARE SYMPTOMS THAT SHOULD BE REPORTED IMMEDIATELY:  *FEVER GREATER THAN 100.5 F  *CHILLS WITH OR WITHOUT FEVER  NAUSEA AND VOMITING THAT IS NOT CONTROLLED WITH YOUR NAUSEA MEDICATION  *UNUSUAL SHORTNESS OF BREATH  *UNUSUAL BRUISING OR BLEEDING  TENDERNESS IN MOUTH AND THROAT WITH OR WITHOUT PRESENCE OF ULCERS  *URINARY PROBLEMS  *BOWEL PROBLEMS  UNUSUAL RASH Items with * indicate a potential emergency and should be followed up as soon as possible.  Feel free to call the clinic should you have any questions or concerns. The clinic phone number is [phone].  Please show the [facility] at check-in to the Emergency Department and triage nurse.

## 2019-10-20 ENCOUNTER — Ambulatory Visit
Admission: RE | Admit: 2019-10-20 | Discharge: 2019-10-20 | Disposition: A | Discharge status: Home / Self Care | Payer: BC Managed Care – PPO | Source: Ambulatory Visit | Attending: Surgery | Admitting: Surgery

## 2019-10-20 ENCOUNTER — Telehealth: Payer: Self-pay | Admitting: Oncology

## 2019-10-20 DIAGNOSIS — E041 Nontoxic single thyroid nodule: Secondary | ICD-10-CM | POA: Diagnosis not present

## 2019-10-20 DIAGNOSIS — E042 Nontoxic multinodular goiter: Secondary | ICD-10-CM

## 2019-10-20 NOTE — Telephone Encounter (Signed)
I talk with patient regarding schedule  

## 2019-10-27 DIAGNOSIS — Z452 Encounter for adjustment and management of vascular access device: Secondary | ICD-10-CM | POA: Diagnosis not present

## 2019-10-27 DIAGNOSIS — Z01818 Encounter for other preprocedural examination: Secondary | ICD-10-CM | POA: Diagnosis not present

## 2019-10-27 DIAGNOSIS — Z853 Personal history of malignant neoplasm of breast: Secondary | ICD-10-CM | POA: Diagnosis not present

## 2019-10-27 DIAGNOSIS — Z713 Dietary counseling and surveillance: Secondary | ICD-10-CM | POA: Diagnosis not present

## 2019-11-05 DIAGNOSIS — Z452 Encounter for adjustment and management of vascular access device: Secondary | ICD-10-CM | POA: Diagnosis not present

## 2019-11-05 DIAGNOSIS — Z853 Personal history of malignant neoplasm of breast: Secondary | ICD-10-CM | POA: Diagnosis not present

## 2019-11-19 DIAGNOSIS — Z713 Dietary counseling and surveillance: Secondary | ICD-10-CM | POA: Diagnosis not present

## 2019-11-24 DIAGNOSIS — Z713 Dietary counseling and surveillance: Secondary | ICD-10-CM | POA: Diagnosis not present

## 2019-12-14 ENCOUNTER — Encounter (HOSPITAL_COMMUNITY): Payer: Self-pay | Admitting: Internal Medicine

## 2019-12-14 ENCOUNTER — Ambulatory Visit (HOSPITAL_BASED_OUTPATIENT_CLINIC_OR_DEPARTMENT_OTHER)
Admission: RE | Admit: 2019-12-14 | Discharge: 2019-12-14 | Disposition: A | Discharge status: Home / Self Care | Payer: BC Managed Care – PPO | Source: Ambulatory Visit | Attending: Internal Medicine | Admitting: Internal Medicine

## 2019-12-14 ENCOUNTER — Ambulatory Visit (HOSPITAL_COMMUNITY)
Admission: RE | Admit: 2019-12-14 | Discharge: 2019-12-14 | Disposition: A | Discharge status: Home / Self Care | Payer: BC Managed Care – PPO | Source: Ambulatory Visit | Attending: Internal Medicine | Admitting: Internal Medicine

## 2019-12-14 ENCOUNTER — Other Ambulatory Visit: Payer: Self-pay

## 2019-12-14 VITALS — BP 110/70 | HR 76 | Wt 187.6 lb

## 2019-12-14 DIAGNOSIS — Z8249 Family history of ischemic heart disease and other diseases of the circulatory system: Secondary | ICD-10-CM | POA: Diagnosis not present

## 2019-12-14 DIAGNOSIS — Z9221 Personal history of antineoplastic chemotherapy: Secondary | ICD-10-CM | POA: Diagnosis not present

## 2019-12-14 DIAGNOSIS — Z881 Allergy status to other antibiotic agents status: Secondary | ICD-10-CM | POA: Diagnosis not present

## 2019-12-14 DIAGNOSIS — J45909 Unspecified asthma, uncomplicated: Secondary | ICD-10-CM | POA: Diagnosis not present

## 2019-12-14 DIAGNOSIS — Z880 Allergy status to penicillin: Secondary | ICD-10-CM | POA: Diagnosis not present

## 2019-12-14 DIAGNOSIS — F419 Anxiety disorder, unspecified: Secondary | ICD-10-CM | POA: Diagnosis not present

## 2019-12-14 DIAGNOSIS — K219 Gastro-esophageal reflux disease without esophagitis: Secondary | ICD-10-CM | POA: Insufficient documentation

## 2019-12-14 DIAGNOSIS — Z01818 Encounter for other preprocedural examination: Secondary | ICD-10-CM | POA: Diagnosis not present

## 2019-12-14 DIAGNOSIS — G4730 Sleep apnea, unspecified: Secondary | ICD-10-CM | POA: Diagnosis not present

## 2019-12-14 DIAGNOSIS — C50211 Malignant neoplasm of upper-inner quadrant of right female breast: Secondary | ICD-10-CM | POA: Diagnosis not present

## 2019-12-14 DIAGNOSIS — Z170 Estrogen receptor positive status [ER+]: Secondary | ICD-10-CM

## 2019-12-14 DIAGNOSIS — Z800 Family history of malignant neoplasm of digestive organs: Secondary | ICD-10-CM | POA: Insufficient documentation

## 2019-12-14 DIAGNOSIS — Z79810 Long term (current) use of selective estrogen receptor modulators (SERMs): Secondary | ICD-10-CM | POA: Diagnosis not present

## 2019-12-14 DIAGNOSIS — Z79899 Other long term (current) drug therapy: Secondary | ICD-10-CM | POA: Insufficient documentation

## 2019-12-14 DIAGNOSIS — Z801 Family history of malignant neoplasm of trachea, bronchus and lung: Secondary | ICD-10-CM | POA: Diagnosis not present

## 2019-12-14 DIAGNOSIS — Z833 Family history of diabetes mellitus: Secondary | ICD-10-CM | POA: Insufficient documentation

## 2019-12-14 DIAGNOSIS — I10 Essential (primary) hypertension: Secondary | ICD-10-CM

## 2019-12-14 DIAGNOSIS — Z8261 Family history of arthritis: Secondary | ICD-10-CM | POA: Insufficient documentation

## 2019-12-14 DIAGNOSIS — Z808 Family history of malignant neoplasm of other organs or systems: Secondary | ICD-10-CM | POA: Diagnosis not present

## 2019-12-14 LAB — ECHOCARDIOGRAM COMPLETE

## 2019-12-14 NOTE — Progress Notes (Signed)
CARDIO-ONCOLOGY CLINIC NOTE  Referring Physician: [facility]:   HPI:  Debbie [name] is a 59 y.o. female with right breast cancer referred by Dr. Jana Hakim for enrollment into the Cardio-Oncology program.   Underwent right breast biopsy in 10/28/2018 for a clinical T2 N1, stage IIA invasive ductal carcinoma, grade 2, estrogen receptor positive, progesterone receptor negative, HER-2 amplified, with an MIB-1 of 20%             (a) staging work-up with CT scans of the chest abdomen and pelvis and thyroid ultrasonography shows multiple findings that will require follow-up:                         (i) two 0.5 cm liver lesions                         (ii) possible bladder mass, thickened sigmoid and rectum, and prominent adnexa                         (iii) thyroid nodule biopsied 11/19/2018, read as [facility] 3                          (iiii)  thyroid lymph node biopsied 11/19/2018 showing no evidence of carcinoma  (1) neoadjuvant chemotherapy will consist of carboplatin, docetaxel, trastuzumab, and Pertuzumab every 21 days x 6 starting 11/17/2018             (a) pertuzumab discontinued after cycle 1 due to severe diarrhea  (2) trastuzumab will be continued to complete 1 year  (3) Apr 15, 2019 lumpectomy with LN removal  (4) XRT in progress  (5) antiestrogens at the completion of local treatment  Here for f/u. Finished Herceptin in 10/19/19.  Doing very well. Goes to gym regularly but not for past 2 weeks after COVID shot. No SB or swelling.   Echo today EF 60-65% Personally reviewed   Echo 09/15/19 EF 60-65% Grade 2 DD GLS - 23.5% Personally reviewe   Echo 06/08/19: EF 60% GLS -22.3% ECHO: 03/10/19  EF 60% GLS -23.2%   Past Medical History:  Diagnosis Date  . Anxiety   . Asthma    with severe URIs  . Bronchitis   . Essential hypertension, benign 12/01/2012  . Generalized anxiety disorder 12/01/2012  . GERD (gastroesophageal reflux disease)   . Hypertension    lost  over 100lbs, no BP meds now, HCTZ is for edema  . Irritable bowel syndrome (IBS)    "had for years," per patient  . PONV (postoperative nausea and vomiting)   . Seasonal allergies   . Sleep apnea    uses CPAP nightly    Current Outpatient Medications  Medication Sig Dispense Refill  . AMBULATORY NON FORMULARY MEDICATION Continuous positive airway pressure (CPAP) machine set at autopap, with all supplemental supplies as needed. 1 each 0  . calcium-vitamin D (OSCAL WITH D) 500-200 MG-UNIT tablet Take 1 tablet by mouth daily with breakfast.    . hydrochlorothiazide (HYDRODIURIL) 12.5 MG tablet TAKE 1 TABLET DAILY (NEED APPOINTMENT) 30 tablet 0  . Liraglutide -Weight Management (SAXENDA) 18 MG/3ML SOPN Inject 1.8 mg into the skin daily.     Marland Kitchen loratadine (CLARITIN) 10 MG tablet Take 10 mg by mouth daily as needed.     Marland Kitchen losartan (COZAAR) 50 MG tablet Take 1 tablet (50 mg total) by mouth daily. Jobos  tablet 6  . montelukast (SINGULAIR) 10 MG tablet TAKE 1 TABLET AT BEDTIME 90 tablet 3  . Multiple Vitamin (MULTIVITAMIN WITH MINERALS) TABS tablet Take 1 tablet by mouth daily.    . pantoprazole (PROTONIX) 40 MG tablet Take 1 tablet (40 mg total) by mouth daily. 90 tablet 4  . potassium chloride (MICRO-K) 10 MEQ CR capsule Take 2 capsules (20 mEq total) by mouth daily. 180 capsule 2  . PROAIR HFA 108 (90 Base) MCG/ACT inhaler Inhale 2 puffs into the lungs every 4 (four) hours as needed for wheezing or shortness of breath.    . tamoxifen (NOLVADEX) 20 MG tablet Take 20 mg by mouth daily.    . valACYclovir (VALTREX) 1000 MG tablet Take 1 tablet (1,000 mg total) by mouth 2 (two) times daily as needed (cold sore). 60 tablet 1  . venlafaxine XR (EFFEXOR-XR) 37.5 MG 24 hr capsule Take 1 capsule (37.5 mg total) by mouth daily with breakfast. 90 capsule 4  . zolpidem (AMBIEN) 5 MG tablet Take 1 tablet (5 mg total) by mouth at bedtime as needed for sleep. 20 tablet 0   No current facility-administered  medications for this encounter.    Allergies  Allergen Reactions  . Azithromycin Diarrhea  . Erythromycin Itching  . Erythromycin Base Itching  . Levofloxacin     Tendon pain  . Penicillin G Rash, Swelling and Hives  . Penicillins Swelling and Rash    Did it involve swelling of the face/tongue/throat, SOB, or low BP? Yes Did it involve sudden or severe rash/hives, skin peeling, or any reaction on the inside of your mouth or nose? No Did you need to seek medical attention at a hospital or doctor's office? Yes When did it last happen?40+ years ago If all above answers are "NO", may proceed with cephalosporin use.       Social History   Socioeconomic History  . Marital status: Married    Spouse name: [name]  . Number of children: 2  . Years of education: Not on file  . Highest education level: Not on file  Occupational History  . Not on file  Tobacco Use  . Smoking status: Never Smoker  . Smokeless tobacco: Never Used  Substance and Sexual Activity  . Alcohol use: Yes    Comment: rare  . Drug use: No  . Sexual activity: Not Currently  Other Topics Concern  . Not on file  Social History Narrative  . Not on file   Social Determinants of Health   Financial Resource Strain:   . Difficulty of Paying Living Expenses:   Food Insecurity:   . Worried About Charity fundraiser in the Last Year:   . Arboriculturist in the Last Year:   Transportation Needs:   . Film/video editor (Medical):   Marland Kitchen Lack of Transportation (Non-Medical):   Physical Activity:   . Days of Exercise per Week:   . Minutes of Exercise per Session:   Stress:   . Feeling of Stress :   Social Connections:   . Frequency of Communication with Friends and Family:   . Frequency of Social Gatherings with Friends and Family:   . Attends Religious Services:   . Active Member of Clubs or Organizations:   . Attends Archivist Meetings:   Marland Kitchen Marital Status:   Intimate Partner Violence:     . Fear of Current or Ex-Partner:   . Emotionally Abused:   Marland Kitchen Physically Abused:   .  Sexually Abused:       Family History  Problem Relation Age of Onset  . Hypertension Mother   . Rheum arthritis Mother   . Heart failure Mother   . Hypertension Father   . Alcoholism Father   . Colon cancer Father   . Lung cancer Father   . Depression Father   . Diabetes Father   . Skin cancer Father   . Hypertension Brother   . Stroke Brother   . Atrial fibrillation Brother   . Hyperlipidemia Maternal Grandfather   . Stroke Paternal Grandmother   . Skin cancer Paternal Grandmother   . Healthy Daughter   . Healthy Daughter   . Breast cancer Neg Hx   . Ovarian cancer Neg Hx   . Prostate cancer Neg Hx   . Pancreatic cancer Neg Hx     Vitals:   12/14/19 1347  BP: 110/70  Pulse: 76  SpO2: 98%  Weight: 85.1 kg (187 lb 9.6 oz)    PHYSICAL EXAM: General:  Well appearing. No resp difficulty HEENT: normal Neck: supple. no JVD. Carotids 2+ bilat; no bruits. No lymphadenopathy or thryomegaly appreciated. Cor: PMI nondisplaced. Regular rate & rhythm. No rubs, gallops or murmurs. Lungs: clear Abdomen: obese soft, nontender, nondistended. No hepatosplenomegaly. No bruits or masses. Good bowel sounds. Extremities: no cyanosis, clubbing, rash, edema Neuro: alert & orientedx3, cranial nerves grossly intact. moves all 4 extremities w/o difficulty. Affect pleasant    ASSESSMENT & PLAN:  1. Breast Cancer - I reviewed echos personally. EF and Doppler parameters stable. No HF on exam. Has finished Herceptin. Can f/u PRN.   2. HTN  - Blood pressure well controlled. Continue current regimen.  3. Mild coronary calcium in LAD on chest CT - asymptomatic - recommend statin as needed to keep LDL < 100  Glori Bickers, MD  2:14 PM

## 2019-12-14 NOTE — Addendum Note (Signed)
Encounter addended by: Shonna Chock, CMA on: 0000000 2:22 PM  Actions taken: Follow-up modified, Clinical Note Signed

## 2019-12-14 NOTE — Progress Notes (Signed)
Echocardiogram 2D Echocardiogram has been performed.  Oneal Deputy [place] Korver 12/14/2019, 1:34 PM

## 2019-12-14 NOTE — Patient Instructions (Signed)
No labs done today.  No medication changes were made. Please continue all current medications as prescribed.   Your physician recommends that you schedule a follow-up appointment as needed.  At the [facility], you and your health needs are our priority. As part of our continuing mission to provide you with exceptional heart care, we have created designated Provider Care Teams. These Care Teams include your primary Cardiologist (physician) and Advanced Practice Providers (APPs- Physician Assistants and Nurse Practitioners) who all work together to provide you with the care you need, when you need it.   You may see any of the following providers on your designated Care Team at your next follow up: Marland Kitchen Dr Glori Bickers . Dr Loralie Champagne . Darrick Grinder, NP . Lyda Jester, PA . Audry Riles, PharmD   Please be sure to bring in all your medications bottles to every appointment.

## 2019-12-15 ENCOUNTER — Telehealth: Payer: Self-pay | Admitting: *Deleted

## 2019-12-15 MED ORDER — METHYLPREDNISOLONE 4 MG PO TBPK: ORAL_TABLET | ORAL | 0 refills | Status: DC

## 2019-12-15 NOTE — Telephone Encounter (Signed)
This RN spoke with pt per her email to nurse navigator per poison ivy break out now affecting multiple areas with no relief from OTC or home remedies.  Per MD - prednisone taper script given.  Called and discussed with with to verify pharmacy

## 2019-12-21 ENCOUNTER — Other Ambulatory Visit: Payer: Self-pay | Admitting: Physician Assistant

## 2020-01-03 ENCOUNTER — Other Ambulatory Visit: Payer: Self-pay | Admitting: Oncology

## 2020-01-03 ENCOUNTER — Other Ambulatory Visit: Payer: Self-pay

## 2020-01-03 ENCOUNTER — Ambulatory Visit
Admission: RE | Admit: 2020-01-03 | Discharge: 2020-01-03 | Disposition: A | Discharge status: Home / Self Care | Payer: BC Managed Care – PPO | Source: Ambulatory Visit | Attending: Oncology | Admitting: Oncology

## 2020-01-03 DIAGNOSIS — R922 Inconclusive mammogram: Secondary | ICD-10-CM | POA: Diagnosis not present

## 2020-01-03 DIAGNOSIS — R921 Mammographic calcification found on diagnostic imaging of breast: Secondary | ICD-10-CM

## 2020-01-03 DIAGNOSIS — Z170 Estrogen receptor positive status [ER+]: Secondary | ICD-10-CM

## 2020-01-03 HISTORY — DX: Personal history of irradiation: Z92.3

## 2020-01-03 HISTORY — DX: Personal history of antineoplastic chemotherapy: Z92.21

## 2020-01-03 NOTE — Progress Notes (Signed)
Debbie [name]  [phone] [phone]    ID: Debbie [name] DOB: 03/25/1961  MR#: [id number]  MBE#:[id number]  Patient [name] Team: Debbie [name] as PCP - General (Family Medicine) Debbie Overall, MD as Consulting Physician (General Surgery) Debbie [name], Debbie Dad, MD as Consulting Physician (Oncology) Debbie Pray, MD as Consulting Physician (Radiation Oncology) Debbie [name], Debbie Brigham, MD as Referring Physician (Ophthalmology) Debbie Dresser, MD as Consulting Physician (Cardiology) Debbie Kaufmann, RN as Oncology Nurse Navigator Debbie Germany, RN as Oncology Nurse Navigator OTHER MD: Debbie Lagos, MD (Ophthalmology)   CHIEF COMPLAINT: Estrogen receptor positive breast cancer  CURRENT TREATMENT: tamoxifen   INTERVAL HISTORY: Debbie [name] returns today for follow-up and treatment of her HER-2 positive breast cancer.   She continues on Tamoxifen.  She has minimal problems with hot flashes and vaginal dryness is not an issue.  Since her last visit, she underwent bilateral diagnostic mammography with tomography at [facility] yesterday, 01/03/2020 showing: breast density category C; indeterminate 7.6 cm calcifications or metallic opacities within right breast, favored to represent postoperative changes or, less likely, fat necrosis; similar opacities identified in region of right axillary node scar.  She is scheduled to undergo biopsy of the right breast and axilla areas in question on 01/12/2020.  She also underwent echocardiogram on 12/14/2019 showing an ejection fraction of 60-65%.   REVIEW OF SYSTEMS: Debbie [name] felt the bite in her shoe today as she was getting her blood pressure taken and it turned out to be a tick that she may have picked up on the way here (she walked through the grass).  In any case that was removed and I let her know if she develops a rash or fever to call us.  She is going to the gym twice a week and plans to  eventually go 6 times a week.  She had her thyroid issues evaluated by Dr. Toy [name] who was very reassuring.  She has had both her Pfizer vaccines and tolerated them well.  Note that she had the vaccine shots in the left shoulder and the current issue with the mammography is on the right  HISTORY OF CURRENT ILLNESS: From the original intake note:  Debbie [name] had routine screening mammography on 10/21/2018 showing a possible abnormality in the right breast. She felt a lump in her breast, possibly dating back to 01/2018, but she didn't think anything about it due to her history with fibrous breasts; she has had fibrous breasts all her life and began to have mammograms at the age of 54.   She underwent right breast ultrasonography at [facility] on 10/28/2018 showing: On physical exam, a firm, fixed mass is palpated in the superior right breast. Targeted ultrasound is performed, showing an irregular hypoechoic mass at the 1 o'clock position 4 cm from the nipple. It measures 2.8 x 2.4 x 1.8 cm. There is associated peripheral and internal vascularity. An oval, circumscribed hypoechoic mass is also identified within the vicinity at the 1 o'clock position 4 cm from the nipple. It measures 1.0 x 0.9 x 0.4 cm. This corresponds with an additional mammographically identified mass in the upper inner quadrant at middle depth. This is mammographically stable dating back to at least 2014. Additional stable, circumscribed masses are scattered throughout the bilateral breasts mammographically. Evaluation of the right axilla demonstrates a single, markedly abnormal lymph node with cortical thickening up to 1 cm. No additional suspicious lymphadenopathy is identified.  Accordingly on 10/28/2018 she  proceeded to biopsy of the right breast area in question. The pathology from this procedure showed ([id number]): invasive ductal carcinoma, nottingham grade II of III, 2.8 cm, 1 o'clock, 4.0 cm from the nipple.  Prognostic indicators significant for: estrogen receptor, 90% positive, with strong staining intensity and progesterone receptor, 0% negative. Proliferation marker Ki67 at 20%. HER2 Positive (3+) by immunohistochemistry.   An additional biopsy of the right axilla was performed on the same day ([id number]) showing:  2. Lymph node, needle/core biopsy, inferior right axilla - Tumor cells within fragmented nodal tissue  The patient's subsequent history is as detailed above.   PAST MEDICAL HISTORY: Past Medical History:  Diagnosis Date  . Anxiety   . Asthma    with severe URIs  . Bronchitis   . Essential hypertension, benign 12/01/2012  . Generalized anxiety disorder 12/01/2012  . GERD (gastroesophageal reflux disease)   . Hypertension    lost over 100lbs, no BP meds now, HCTZ is for edema  . Irritable bowel syndrome (IBS)    "had for years," per patient  . Personal history of chemotherapy   . Personal history of radiation therapy   . PONV (postoperative nausea and vomiting)   . Seasonal allergies   . Sleep apnea    uses CPAP nightly    PAST SURGICAL HISTORY: Past Surgical History:  Procedure Laterality Date  . ABDOMINAL HYSTERECTOMY  2011   with vaginal sling  . ANKLE SURGERY    . BREAST CYST EXCISION Bilateral   . BREAST EXCISIONAL BIOPSY Bilateral   . BREAST LUMPECTOMY Right 03/2019  . BREAST LUMPECTOMY WITH RADIOACTIVE SEED AND AXILLARY LYMPH NODE DISSECTION Right 04/15/2019   Procedure: RIGHT BREAST LUMPECTOMY WITH RADIOACTIVE SEED AND  TARGETED RIGHT AXILLARY LYMPH NODE DISSECTION WITH RADIOACTIVE SEED;  Surgeon: Debbie Overall, MD;  Location: [facility];  Service: General;  Laterality: Right;  . CHOLECYSTECTOMY  2012  . KNEE ARTHROSCOPY W/ MEDIAL COLLATERAL LIGAMENT (MCL) REPAIR Left 10-07-2012  . PORTACATH PLACEMENT N/A 11/16/2018   Procedure: INSERTION PORT-A-CATH WITH ULTRASOUND;  Surgeon: Debbie Overall, MD;  Location: [facility];  Service: General;  Laterality:  N/A;  . TONSILLECTOMY  1969    FAMILY HISTORY: Family History  Problem Relation Age of Onset  . Hypertension Mother   . Rheum arthritis Mother   . Heart failure Mother   . Hypertension Father   . Alcoholism Father   . Colon cancer Father   . Lung cancer Father   . Depression Father   . Diabetes Father   . Skin cancer Father   . Hypertension Brother   . Stroke Brother   . Atrial fibrillation Brother   . Hyperlipidemia Maternal Grandfather   . Stroke Paternal Grandmother   . Skin cancer Paternal Grandmother   . Healthy Daughter   . Healthy Daughter   . Breast cancer Neg Hx   . Ovarian cancer Neg Hx   . Prostate cancer Neg Hx   . Pancreatic cancer Neg Hx    Estelene's father died from lung cancer at age 30. Patients' mother died from congestive heart failure at age 95. The patient has 1 brother. Patient denies anyone in her family having breast, ovarian, prostate, or pancreatic cancer. Debbie [name]'s father was diagnosed with skin cancer, colon cancer, and lung cancer. Debbie [name]'s paternal grandmother was diagnosed with skin cancer.    GYNECOLOGIC HISTORY:  No LMP recorded. Patient has had a hysterectomy. 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 Her husband, Debbie [name], is in [employer]	[place]. They have two daughters, Debbie [name] and Debbie [name]. [name] Dawna [name] is 36, lives in [place], [place], and works in Building surveyor. Debbie [name] is 54, lives in [place], [place], and is getting married in 2021  ADVANCED DIRECTIVES: In the absence of any documentation, Debbie [name]'s spouse, Debbie [name], is her healthcare power of attorney.      HEALTH MAINTENANCE: Social History   Tobacco Use  . Smoking status: Never Smoker    . Smokeless tobacco: Never Used  Substance Use Topics  . Alcohol use: Yes    Comment: rare  . Drug use: No    Colonoscopy: yes, 2015, [facility]   PAP: 2011  Bone density: 07/2019, -1.8 Mammography: 10/21/2018  Allergies  Allergen Reactions  . Azithromycin Diarrhea  . Erythromycin Itching  . Erythromycin Base Itching  . Levofloxacin     Tendon pain  . Penicillin G Rash, Swelling and Hives  . Penicillins Swelling and Rash    Did it involve swelling of the face/tongue/throat, SOB, or low BP? Yes Did it involve sudden or severe rash/hives, skin peeling, or any reaction on the inside of your mouth or nose? No Did you need to seek medical attention at a hospital or doctor's office? Yes When did it last happen?40+ years ago If all above answers are "NO", may proceed with cephalosporin use.     Current Outpatient Medications  Medication Sig Dispense Refill  . hydrochlorothiazide (HYDRODIURIL) 12.5 MG tablet TAKE 1 TABLET DAILY (NEED APPOINTMENT) 15 tablet 0  . AMBULATORY NON FORMULARY MEDICATION Continuous positive airway pressure (CPAP) machine set at autopap, with all supplemental supplies as needed. 1 each 0  . calcium-vitamin D (OSCAL WITH D) 500-200 MG-UNIT tablet Take 1 tablet by mouth daily with breakfast.    . Liraglutide -Weight Management (SAXENDA) 18 MG/3ML SOPN Inject 1.8 mg into the skin daily.     Marland Kitchen loratadine (CLARITIN) 10 MG tablet Take 10 mg by mouth daily as needed.     Marland Kitchen losartan (COZAAR) 50 MG tablet Take 1 tablet (50 mg total) by mouth daily. 30 tablet 6  . methylPREDNISolone (MEDROL DOSEPAK) 4 MG TBPK tablet Take as directed for 6 days 21 tablet 0  . montelukast (SINGULAIR) 10 MG tablet TAKE 1 TABLET AT BEDTIME 90 tablet 3  . Multiple Vitamin (MULTIVITAMIN WITH MINERALS) TABS tablet Take 1 tablet by mouth daily.    . pantoprazole (PROTONIX) 40 MG tablet Take 1 tablet (40 mg total) by mouth daily. 90 tablet 4  . potassium chloride (MICRO-K) 10  MEQ CR capsule Take 2 capsules (20 mEq total) by mouth daily. 180 capsule 2  . PROAIR HFA 108 (90 Base) MCG/ACT inhaler Inhale 2 puffs into the lungs every 4 (four) hours as needed for wheezing or shortness of breath.    . tamoxifen (NOLVADEX) 20 MG tablet Take 20 mg by mouth daily.    . valACYclovir (VALTREX) 1000 MG tablet Take 1 tablet (1,000 mg total) by mouth 2 (two) times daily as needed (cold sore). 60 tablet 1  . venlafaxine XR (EFFEXOR-XR) 37.5 MG 24 hr capsule Take 1 capsule (37.5 mg total) by mouth daily with breakfast. 90 capsule 4  .  zolpidem (AMBIEN) 5 MG tablet Take 1 tablet (5 mg total) by mouth at bedtime as needed for sleep. 20 tablet 0   No current facility-administered medications for this visit.     OBJECTIVE:  white [name] in no acute distress  Vitals:   01/04/20 1329  BP: 118/69  Pulse: 80  Resp: 17  Temp: 98.4 F (36.9 C)  SpO2: 100%     Body mass index is 30.07 kg/m.   Wt Readings from Last 3 Encounters:  01/04/20 186 lb 4.8 oz (84.5 kg)  12/14/19 187 lb 9.6 oz (85.1 kg)  10/19/19 187 lb 3.2 oz (84.9 kg)  ECOG FS:1 - Symptomatic but completely ambulatory  Sclerae unicteric, EOMs intact Wearing a mask No cervical or supraclavicular adenopathy Lungs no rales or rhonchi Heart regular rate and rhythm Abd soft, nontender, positive bowel sounds MSK no focal spinal tenderness, no upper extremity lymphedema Neuro: nonfocal, well oriented, appropriate affect Breasts: The right breast is status post lumpectomy followed by radiation.  There is no evidence of local recurrence.  Left breast is benign.  Both axillae are benign.   LAB RESULTS:  CMP     Component Value Date/Time   NA 144 01/04/2020 1258   K 3.3 (L) 01/04/2020 1258   CL 104 01/04/2020 1258   CO2 31 01/04/2020 1258   GLUCOSE 93 01/04/2020 1258   BUN 12 01/04/2020 1258   CREATININE 0.74 01/04/2020 1258   CREATININE 0.71 01/20/2019 0752   CREATININE 0.66 07/08/2017 1127   CALCIUM 9.1  01/04/2020 1258   PROT 6.5 01/04/2020 1258   ALBUMIN 3.8 01/04/2020 1258   AST 20 01/04/2020 1258   AST 42 (H) 01/20/2019 0752   ALT 17 01/04/2020 1258   ALT 42 01/20/2019 0752   ALKPHOS 77 01/04/2020 1258   BILITOT 0.7 01/04/2020 1258   BILITOT 0.5 01/20/2019 0752   GFRNONAA >60 01/04/2020 1258   GFRNONAA >60 01/20/2019 0752   GFRNONAA 98 07/08/2017 1127   GFRAA >60 01/04/2020 1258   GFRAA >60 01/20/2019 0752   GFRAA 114 07/08/2017 1127    No results found for: TOTALPROTELP, ALBUMINELP, A1GS, A2GS, BETS, BETA2SER, GAMS, MSPIKE, SPEI  No results found for: KPAFRELGTCHN, LAMBDASER, KAPLAMBRATIO  Lab Results  Component Value Date   WBC 5.0 01/04/2020   NEUTROABS 3.6 01/04/2020   HGB 13.8 01/04/2020   HCT 39.9 01/04/2020   MCV 87.3 01/04/2020   PLT 209 01/04/2020    No results found for: LABCA2  No components found for: [id number]  No results for input(s): INR in the last 168 hours.  No results found for: LABCA2  No results found for: YTK160  No results found for: FUX323  No results found for: FTD322  No results found for: CA2729  No components found for: HGQUANT  No results found for: CEA1 / No results found for: CEA1   No results found for: AFPTUMOR  No results found for: CHROMOGRNA  No results found for: HGBA, HGBA2QUANT, HGBFQUANT, HGBSQUAN (Hemoglobinopathy evaluation)   No results found for: LDH  Lab Results  Component Value Date   IRON 46 05/11/2013   TIBC 254 05/11/2013   IRONPCTSAT 18 (L) 05/11/2013   (Iron and TIBC)  Lab Results  Component Value Date   FERRITIN 72 05/11/2013    Urinalysis    Component Value Date/Time   BILIRUBINUR Small 11/09/2018 0914   PROTEINUR Negative 11/09/2018 0914   UROBILINOGEN 0.2 11/09/2018 0914   NITRITE Negative 11/09/2018 0914   LEUKOCYTESUR Negative 11/09/2018  0914     STUDIES:  MM DIAG BREAST TOMO BILATERAL  Result Date: 01/03/2020 CLINICAL DATA:  Status post RIGHT lumpectomy and axillary  node dissection for grade 2 invasive ductal carcinoma and lymph node metastasis following neoadjuvant treatment. Surgery was performed in August 2020. Final pathology showed no residual carcinoma in the breast and 4 lymph nodes with no metastatic carcinoma. Patient has been treated with radiation. EXAM: DIGITAL DIAGNOSTIC BILATERAL MAMMOGRAM WITH CAD AND TOMO COMPARISON:  02/13/2019 and earlier ACR Breast Density Category c: The breast tissue is heterogeneously dense, which may obscure small masses. FINDINGS: Surgical clips are identified within the [address] of the RIGHT breast. Multiple uniform calcifications or metallic opacities are identified in the [place] of the RIGHT breast, in the region of the lumpectomy cavity and extending to the skin. These opacities span 2.0 x 7.6 x 1.8 centimeters. Similar opacities are identified in the region of the axillary node scar. LEFT breast is negative. Mammographic images were processed with CAD. IMPRESSION: Indeterminate calcifications or metallic opacities within the RIGHT breast, favored to represent postoperative changes or less likely, fat necrosis. Is difficult to entirely exclude malignancy, given the patient's history of invasive ductal carcinoma. RECOMMENDATION: Recommend stereotactic guided core biopsy of calcifications/opacities within the lumpectomy cavity and the axillary node incision. I have discussed the findings and recommendations with the patient. If applicable, a reminder letter will be sent to the patient regarding the next appointment. BI-RADS CATEGORY  4: Suspicious. Electronically Signed   By: Nolon Nations M.D.   On: 01/03/2020 16:54   ECHOCARDIOGRAM COMPLETE  Result Date: 12/14/2019    ECHOCARDIOGRAM REPORT   Patient Name:   AARIKA MOON Date of Exam: 12/14/2019 Medical Rec #:  [id number]          Height:       66.0 in Accession #:    [id number]         Weight:       187.2 lb Date of Birth:  [birth year]          BSA:           1.944 m Patient Age:    8 years           BP:           130/80 mmHg Patient Gender: F                  HR:           72 bpm. Exam Location:  Outpatient Procedure: 2D Echo, Color Doppler, Cardiac Doppler and Strain Analysis Indications:    Chemo Evaluation v67.2  History:        Patient has prior history of Echocardiogram examinations, most                 recent 09/15/2019. Risk Factors:Hypertension and Sleep Apnea.                 Breast Cancer.  Sonographer:    Raquel Sarna Senior RDCS Referring Phys: [id number] DANIEL R BENSIMHON IMPRESSIONS  1. Left ventricular ejection fraction, by estimation, is 60 to 65%. The left ventricle has normal function. The left ventricle has no regional wall motion abnormalities. Left ventricular diastolic parameters are consistent with Grade I diastolic dysfunction (impaired relaxation). The average left ventricular global longitudinal strain is -19.2 %. The global longitudinal strain is normal.  2. Right ventricular systolic function is normal. The right ventricular size is normal. There is normal pulmonary artery  systolic pressure.  3. The mitral valve is normal in structure. Trivial mitral valve regurgitation. No evidence of mitral stenosis.  4. The aortic valve is normal in structure. Aortic valve regurgitation is not visualized. No aortic stenosis is present.  5. The inferior vena cava is normal in size with greater than 50% respiratory variability, suggesting right atrial pressure of 3 mmHg. FINDINGS  Left Ventricle: Left ventricular ejection fraction, by estimation, is 60 to 65%. The left ventricle has normal function. The left ventricle has no regional wall motion abnormalities. The average left ventricular global longitudinal strain is -19.2 %. The global longitudinal strain is normal. The left ventricular internal cavity size was normal in size. There is no left ventricular hypertrophy. Left ventricular diastolic parameters are consistent with Grade I diastolic dysfunction  (impaired relaxation). Indeterminate filling pressures. Right Ventricle: The right ventricular size is normal. No increase in right ventricular wall thickness. Right ventricular systolic function is normal. There is normal pulmonary artery systolic pressure. The tricuspid regurgitant velocity is 1.78 m/s, and  with an assumed right atrial pressure of 3 mmHg, the estimated right ventricular systolic pressure is 31.5 mmHg. Left Atrium: Left atrial size was normal in size. Right Atrium: Right atrial size was normal in size. Pericardium: Trivial pericardial effusion is present. Mitral Valve: The mitral valve is normal in structure. Normal mobility of the mitral valve leaflets. Trivial mitral valve regurgitation. No evidence of mitral valve stenosis. Tricuspid Valve: The tricuspid valve is normal in structure. Tricuspid valve regurgitation is trivial. No evidence of tricuspid stenosis. Aortic Valve: The aortic valve is normal in structure. Aortic valve regurgitation is not visualized. No aortic stenosis is present. Pulmonic Valve: The pulmonic valve was normal in structure. Pulmonic valve regurgitation is not visualized. No evidence of pulmonic stenosis. Aorta: The aortic root is normal in size and structure. Venous: The inferior vena cava is normal in size with greater than 50% respiratory variability, suggesting right atrial pressure of 3 mmHg. IAS/Shunts: No atrial level shunt detected by color flow Doppler.  LEFT VENTRICLE PLAX 2D LVIDd:         4.70 cm  Diastology LVIDs:         2.70 cm  LV e' lateral:   8.59 cm/s LV PW:         1.10 cm  LV E/e' lateral: 8.2 LV IVS:        0.90 cm  LV e' medial:    5.87 cm/s LVOT diam:     2.10 cm  LV E/e' medial:  12.0 LV SV:         74 LV SV Index:   38       2D Longitudinal Strain LVOT Area:     3.46 cm 2D Strain GLS (A2C):   -20.4 %                         2D Strain GLS (A3C):   -17.7 %                         2D Strain GLS (A4C):   -19.3 %                         2D Strain GLS  Avg:     -19.2 % RIGHT VENTRICLE RV S prime:     14.00 cm/s TAPSE (M-mode): 2.4 cm LEFT ATRIUM  Index       RIGHT ATRIUM           Index LA diam:        3.50 cm 1.80 cm/m  RA Area:     10.50 cm LA Vol (A2C):   52.3 ml 26.90 ml/m RA Volume:   20.60 ml  10.59 ml/m LA Vol (A4C):   52.7 ml 27.10 ml/m LA Biplane Vol: 54.3 ml 27.93 ml/m  AORTIC VALVE LVOT Vmax:   109.00 cm/s LVOT Vmean:  75.700 cm/s LVOT VTI:    0.213 m  AORTA Ao Root diam: 3.30 cm Ao Asc diam:  3.50 cm MITRAL VALVE               TRICUSPID VALVE MV Area (PHT): 2.69 cm    TR Peak grad:   12.7 mmHg MV Decel Time: 282 msec    TR Vmax:        178.00 cm/s MV E velocity: 70.30 cm/s MV A velocity: 73.70 cm/s  SHUNTS MV E/A ratio:  0.95        Systemic VTI:  0.21 m                            Systemic Diam: 2.10 cm Skeet Latch MD Electronically signed by Skeet Latch MD Signature Date/Time: 12/14/2019/3:04:33 PM    Final      ELIGIBLE FOR AVAILABLE RESEARCH PROTOCOL: no   ASSESSMENT: 59 y.o. Debbie [name], Debbie [name] status post right breast upper inner quadrant biopsy 10/28/2018 for a clinical T2 N1, stage IIA invasive ductal carcinoma, grade 2, estrogen receptor positive, progesterone receptor negative, HER-2 amplified, with an MIB-1 of 20%  (a) staging work-up with CT scans of the chest abdomen and pelvis and thyroid ultrasonography shows multiple findings that will require follow-up:   (i) two 0.5 cm liver lesions   (ii) possible bladder mass, thickened sigmoid and rectum, and prominent adnexa   (iii) thyroid nodule biopsied 11/19/2018, read as [place] 3 (risk of malignancy 5-15%)   (iiii)  thyroid lymph node biopsied 11/19/2018 showing no evidence of carcinoma   (iiiii) abd MRI 05/07 2020 confirms two tiny liver lesions. No bladder or adnexal malignancy  (1) neoadjuvant chemotherapy consisting of carboplatin, docetaxel, trastuzumab, and pertuzumab every 21 days x 6 started 11/17/2018, copleted 03/02/2019  (a) pertuzumab  discontinued after cycle 1 due to severe diarrhea  (2) trastuzumab continued to complete 1 year (last dose 10/19/2019).).  (a) baseline echocardiogram 11/11/2018 showed an ejection fraction in the 55-60% range  (b) echocardiogram 03/10/2019 shows EF of 60-65%  (c) echo 06/08/2019 shows EF 60%  (d) echo 09/14/2018 shows an ejection fraction in the 60-65% range.  (e) echo 12/14/2019 shows an ejection fraction in the 60-65% range  (3) Right breast lumpectomy on 04/15/2019: no residual carcinoma (ypT0 ypN0)  (a) a total of 4 lymph nodes were removed  (4) adjuvant radiation 05/31/2019 - 07/14/2019 Site Technique Total Dose (Gy) Dose per Fx (Gy) Completed Fx Beam Energies  Breast: Breast_Rt 3D 50.4/50.4 1.8 28/28 6X, 10X  Breast: Breast_Rt_axilla 3D 45/45 1.8 25/25 6X, 10X   (5) started tamoxifen 08/20/2019  (a) s/p hysterectomy  (b) bone density 07/30/2019 shows a T score of -1.8   (c) used oral contraceptives for 7 or 8 years in the past with no complications  (6) thyroid nodules followed by Dr Sherren Mocha gerkin  (7) very small liver abnormalities will require follow-up but are almost certainly benign  PLAN: Debbie [name] is now close to a year out from definitive surgery for breast cancer with no evidence of disease recurrence.  This is favorable.  She is tolerating tamoxifen well and the plan is to continue that a minimum of 5 years.  I do not think the tick bite she had today stayed long enough to cause any problems but as stated if she develops a rash or fever she will call us.  She would like her here to be more straight--is quite currently right now.  I reassured her this will return to her prior normal within the next 6 to 1212 months  I commended her excellent exercise program  She will return to see me in 6 months.  Before that visit she will have a repeat MRI of the liver to clear up the last remaining issue relating to her initial work-up.  In the meantime next week she will have her  repeat mammography to evaluate the calcifications recently seen.  She might need a biopsy.  I do expect this to be benign but of course of biopsies will tell.  Total encounter time 30 minutes.Sarajane Jews C. Hector Taft, MD Medical Oncology and Hematology [facility] [place], [place] [address] Tel. [phone]    Fax. [phone]   I, Wilburn Mylar, am acting as scribe for Dr. Virgie [name]. Khaden Gater.  I, Lurline Del MD, have reviewed the above documentation for accuracy and completeness, and I agree with the above.   *Total Encounter Time as defined by the Centers for Medicare and Medicaid Services includes, in addition to the face-to-face time of a patient visit (documented in the note above) non-face-to-face time: obtaining and reviewing outside history, ordering and reviewing medications, tests or procedures, [name] coordination (communications with other health [name] professionals or caregivers) and documentation in the medical record.

## 2020-01-04 ENCOUNTER — Telehealth: Payer: Self-pay | Admitting: *Deleted

## 2020-01-04 ENCOUNTER — Other Ambulatory Visit: Payer: Self-pay

## 2020-01-04 ENCOUNTER — Inpatient Hospital Stay: Payer: BC Managed Care – PPO | Attending: Oncology | Admitting: Oncology

## 2020-01-04 ENCOUNTER — Inpatient Hospital Stay: Payer: BC Managed Care – PPO

## 2020-01-04 VITALS — BP 118/69 | HR 80 | Temp 98.4°F | Resp 17 | Ht 66.0 in | Wt 186.3 lb

## 2020-01-04 DIAGNOSIS — E042 Nontoxic multinodular goiter: Secondary | ICD-10-CM | POA: Insufficient documentation

## 2020-01-04 DIAGNOSIS — C50211 Malignant neoplasm of upper-inner quadrant of right female breast: Secondary | ICD-10-CM | POA: Diagnosis not present

## 2020-01-04 DIAGNOSIS — Z79810 Long term (current) use of selective estrogen receptor modulators (SERMs): Secondary | ICD-10-CM | POA: Diagnosis not present

## 2020-01-04 DIAGNOSIS — Z170 Estrogen receptor positive status [ER+]: Secondary | ICD-10-CM | POA: Diagnosis not present

## 2020-01-04 DIAGNOSIS — Z923 Personal history of irradiation: Secondary | ICD-10-CM | POA: Diagnosis not present

## 2020-01-04 LAB — CBC WITH DIFFERENTIAL/PLATELET
Abs Immature Granulocytes: 0.01 10*3/uL (ref 0.00–0.07)
Basophils Absolute: 0 10*3/uL (ref 0.0–0.1)
Basophils Relative: 1 %
Eosinophils Absolute: 0.1 10*3/uL (ref 0.0–0.5)
Eosinophils Relative: 3 %
HCT: 39.9 % (ref 36.0–46.0)
Hemoglobin: 13.8 g/dL (ref 12.0–15.0)
Immature Granulocytes: 0 %
Lymphocytes Relative: 15 %
Lymphs Abs: 0.8 10*3/uL (ref 0.7–4.0)
MCH: 30.2 pg (ref 26.0–34.0)
MCHC: 34.6 g/dL (ref 30.0–36.0)
MCV: 87.3 fL (ref 80.0–100.0)
Monocytes Absolute: 0.5 10*3/uL (ref 0.1–1.0)
Monocytes Relative: 10 %
Neutro Abs: 3.6 10*3/uL (ref 1.7–7.7)
Neutrophils Relative %: 71 %
Platelets: 209 10*3/uL (ref 150–400)
RBC: 4.57 MIL/uL (ref 3.87–5.11)
RDW: 12.9 % (ref 11.5–15.5)
WBC: 5 10*3/uL (ref 4.0–10.5)
nRBC: 0 % (ref 0.0–0.2)

## 2020-01-04 LAB — COMPREHENSIVE METABOLIC PANEL
ALT: 17 U/L (ref 0–44)
AST: 20 U/L (ref 15–41)
Albumin: 3.8 g/dL (ref 3.5–5.0)
Alkaline Phosphatase: 77 U/L (ref 38–126)
Anion gap: 9 (ref 5–15)
BUN: 12 mg/dL (ref 6–20)
CO2: 31 mmol/L (ref 22–32)
Calcium: 9.1 mg/dL (ref 8.9–10.3)
Chloride: 104 mmol/L (ref 98–111)
Creatinine, Ser: 0.74 mg/dL (ref 0.44–1.00)
GFR calc Af Amer: >60 mL/min (ref >60)
GFR calc non Af Amer: >60 mL/min (ref >60)
Glucose, Bld: 93 mg/dL (ref 70–99)
Potassium: 3.3 mmol/L — ABNORMAL LOW (ref 3.5–5.1)
Sodium: 144 mmol/L (ref 135–145)
Total Bilirubin: 0.7 mg/dL (ref 0.3–1.2)
Total Protein: 6.5 g/dL (ref 6.5–8.1)

## 2020-01-04 NOTE — Telephone Encounter (Signed)
This RN removed a tick from the patient's left foot ( sole ) with tweezers - head intact upon removal. Removal cleaned with alcohol.  Note tick was not moving upon removal.  Tick had noted " dot " on back ( ? [place] tick ).  Tick put on clear tape and given to pt with date and location of removal.  Pt understands to keep site clean and monitor for further localized reactions.

## 2020-01-05 ENCOUNTER — Telehealth: Payer: Self-pay | Admitting: Oncology

## 2020-01-05 NOTE — Telephone Encounter (Signed)
Scheduled appts per 5/18 los. Pt confirmed appt date and time.  

## 2020-01-12 ENCOUNTER — Ambulatory Visit
Admission: RE | Admit: 2020-01-12 | Discharge: 2020-01-12 | Disposition: A | Discharge status: Home / Self Care | Payer: BC Managed Care – PPO | Source: Ambulatory Visit | Attending: Oncology | Admitting: Oncology

## 2020-01-12 ENCOUNTER — Other Ambulatory Visit: Payer: Self-pay

## 2020-01-12 ENCOUNTER — Other Ambulatory Visit: Payer: Self-pay | Admitting: Oncology

## 2020-01-12 DIAGNOSIS — R921 Mammographic calcification found on diagnostic imaging of breast: Secondary | ICD-10-CM

## 2020-01-12 DIAGNOSIS — N6031 Fibrosclerosis of right breast: Secondary | ICD-10-CM | POA: Diagnosis not present

## 2020-01-26 DIAGNOSIS — G4733 Obstructive sleep apnea (adult) (pediatric): Secondary | ICD-10-CM | POA: Diagnosis not present

## 2020-01-28 DIAGNOSIS — C50911 Malignant neoplasm of unspecified site of right female breast: Secondary | ICD-10-CM | POA: Diagnosis not present

## 2020-02-10 ENCOUNTER — Other Ambulatory Visit (HOSPITAL_COMMUNITY): Payer: Self-pay | Admitting: Internal Medicine

## 2020-02-10 ENCOUNTER — Other Ambulatory Visit: Payer: Self-pay | Admitting: Physician Assistant

## 2020-02-11 ENCOUNTER — Other Ambulatory Visit: Payer: Self-pay | Admitting: Oncology

## 2020-03-08 ENCOUNTER — Encounter: Payer: Self-pay | Admitting: Physician Assistant

## 2020-04-18 DIAGNOSIS — G4733 Obstructive sleep apnea (adult) (pediatric): Secondary | ICD-10-CM | POA: Diagnosis not present

## 2020-04-25 ENCOUNTER — Other Ambulatory Visit: Payer: Self-pay | Admitting: Physician Assistant

## 2020-04-25 ENCOUNTER — Encounter: Payer: Self-pay | Admitting: Physician Assistant

## 2020-04-25 ENCOUNTER — Other Ambulatory Visit (HOSPITAL_COMMUNITY): Payer: Self-pay | Admitting: Internal Medicine

## 2020-04-28 ENCOUNTER — Telehealth (INDEPENDENT_AMBULATORY_CARE_PROVIDER_SITE_OTHER): Payer: BC Managed Care – PPO | Admitting: Physician Assistant

## 2020-04-28 ENCOUNTER — Encounter: Payer: Self-pay | Admitting: Physician Assistant

## 2020-04-28 VITALS — BP 129/76 | HR 64 | Ht 66.0 in | Wt 165.0 lb

## 2020-04-28 DIAGNOSIS — J4520 Mild intermittent asthma, uncomplicated: Secondary | ICD-10-CM

## 2020-04-28 DIAGNOSIS — I10 Essential (primary) hypertension: Secondary | ICD-10-CM | POA: Diagnosis not present

## 2020-04-28 MED ORDER — MONTELUKAST SODIUM 10 MG PO TABS: 10.0000 mg | ORAL_TABLET | Freq: Every day | ORAL | 3 refills | Status: DC

## 2020-04-28 NOTE — Progress Notes (Signed)
Wants to discuss HCTZ, needs refills but wants to discuss coming off medication, no issues but "hates taking medication" and wants to go off some of them  No other issues. BP elevated at 141/83, will recheck after 10 minutes and give reading to [facility] at appt.

## 2020-04-28 NOTE — Progress Notes (Signed)
Patient ID: Debbie [name], female   DOB: [birth year], 60 y.o.   MRN: [id number] .Marland KitchenVirtual Visit via Video Note  I connected with Debbie [name] on 04/28/20 at  8:50 AM EDT by a video enabled telemedicine application and verified that I am speaking with the correct person using two identifiers.  Location: Patient: home Provider: clinic   I discussed the limitations of evaluation and management by telemedicine and the availability of in person appointments. The patient expressed understanding and agreed to proceed.  History of Present Illness: Pt is a 59 yo female with stable breast cancer, hypertension, seasonal allergies who calls into the clinic hoping to have a medication review and get off some medications.  Patient is noticed her blood pressure has been dropping and would like to see if she can get off hydrochlorothiazide.  She is already cut her losartan in half.  Her blood pressures are ranging in the 110s to the 120s/70s.  She denies any chest pain, palpitations, headaches, dizziness.  She is actively been working on weight loss with [employer] and an exercise program.  She has lost about 20 pounds.  Her goal is to be 160 and she has 5 pounds to go.  She does have a 36-month follow-up with her oncologist for her breast cancer.  She is on tamoxifen.  Pt does use singulair seasonally and needs refill for the fall. On claritin.    .. Active Ambulatory Problems    Diagnosis Date Noted  . Essential hypertension, benign 12/01/2012  . Family history of colon cancer 12/01/2012  . History of hysterectomy 01/04/2013  . History of shingles 01/04/2013  . Gastritis 02/17/2013  . Class 1 obesity due to excess calories with serious comorbidity and body mass index (BMI) of 30.0 to 30.9 in adult 03/05/2013  . Elevated LFTs 05/11/2013  . Labral tear of shoulder 08/29/2014  . Large breasts 06/26/2016  . Primary insomnia 06/26/2016  . Mid back pain 06/26/2016  . Chronic shoulder pain  06/26/2016  . Fungal infection of toenail 01/17/2017  . Chest tightness 07/09/2017  . SOB (shortness of breath) 07/09/2017  . Cough 07/09/2017  . Snoring 10/20/2017  . Rectocele 10/20/2017  . Non-restorative sleep 10/20/2017  . No energy 10/20/2017  . Reactive airway disease, mild intermittent, uncomplicated 46/27/0350  . Periodic limb movement 11/23/2017  . OSA (obstructive sleep apnea) 11/23/2017  . Nocturnal hypoxemia 11/23/2017  . Bilateral hand pain 01/23/2018  . Hand joint stiff, unspecified laterality 01/23/2018  . Positive ANA (antinuclear antibody) 02/06/2018  . Hypotension 04/29/2018  . Family history of stroke 04/29/2018  . Primary osteoarthritis of both hands 04/29/2018  . Primary osteoarthritis of both feet 04/29/2018  . Multiple food allergies 07/20/2018  . Malignant neoplasm of upper-inner quadrant of right breast in female, estrogen receptor positive ([place]) 11/02/2018  . Liver nodule 11/11/2018  . Abnormal CT of the abdomen 11/11/2018  . Liver lesion 11/13/2018  . 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 Sigmoid thickening 11/13/2018  . Bladder mass 11/13/2018  . Thyroid nodule 11/13/2018  . Enlarged lymph nodes 11/13/2018  . Port-A-Cath in place 12/29/2018   Resolved Ambulatory Problems    Diagnosis Date Noted  . Generalized anxiety disorder 12/01/2012   Past Medical History:  Diagnosis Date  . Anxiety   . Asthma   . Bronchitis   . GERD (gastroesophageal reflux disease)   . Hypertension   . Irritable bowel syndrome (IBS)   . Personal history of chemotherapy   .  Personal history of radiation therapy   . PONV (postoperative nausea and vomiting)   . Seasonal allergies   . Sleep apnea    See HPI.    Observations/Objective: No acute distress Normal mood and appearance.  Normal breathing.   .. Today's Vitals   04/28/20 0816 04/28/20 0857  BP: (!) 141/83 129/76  Pulse:  64  Weight: 165 lb (74.8 kg)   Height: 5\' 6"  (1.676 m)     Body mass index is 26.63 kg/m.    Assessment and Plan: Marland KitchenMarland KitchenPeggie was seen today for hypertension.  Diagnoses and all orders for this visit:  Essential hypertension, benign  Reactive airway disease, mild intermittent, uncomplicated -     montelukast (SINGULAIR) 10 MG tablet; Take 1 tablet (10 mg total) by mouth at bedtime.   Second blood pressure reading was much better than first.  She reports she is even getting lower readings on her own.  I would like for her to continue keeping a blood pressure log.  I will okay the drop of HCTZ.  In 4 to 6 weeks list to a blood pressure recheck and review of blood pressure log.  I would also like to consider checking her potassium to see if she could potentially get off the potassium as well.  She is only on losartan one half of the 50 mg tablet daily.  Continue on this.  If blood pressure readings averaging in the 110s over 70 we could consider coming off this as well.  Patient has made a lot of huge diet and lifestyle changes.  I am very proud of her.  This is affecting positively her overall health.    Follow Up Instructions:    I discussed the assessment and treatment plan with the patient. The patient was provided an opportunity to ask questions and all were answered. The patient agreed with the plan and demonstrated an understanding of the instructions.   The patient was advised to call back or seek an in-person evaluation if the symptoms worsen or if the condition fails to improve as anticipated.  I provided 10 minutes of non-face-to-face time during this encounter.   Iran Planas, PA-C

## 2020-05-18 DIAGNOSIS — E041 Nontoxic single thyroid nodule: Secondary | ICD-10-CM | POA: Diagnosis not present

## 2020-05-18 DIAGNOSIS — C50911 Malignant neoplasm of unspecified site of right female breast: Secondary | ICD-10-CM | POA: Diagnosis not present

## 2020-06-06 ENCOUNTER — Ambulatory Visit (HOSPITAL_COMMUNITY)
Admission: RE | Admit: 2020-06-06 | Discharge: 2020-06-06 | Disposition: A | Discharge status: Home / Self Care | Payer: BC Managed Care – PPO | Source: Ambulatory Visit | Attending: Oncology | Admitting: Oncology

## 2020-06-06 ENCOUNTER — Other Ambulatory Visit: Payer: Self-pay

## 2020-06-06 DIAGNOSIS — K7689 Other specified diseases of liver: Secondary | ICD-10-CM | POA: Diagnosis not present

## 2020-06-06 DIAGNOSIS — Z9049 Acquired absence of other specified parts of digestive tract: Secondary | ICD-10-CM | POA: Diagnosis not present

## 2020-06-06 DIAGNOSIS — C50919 Malignant neoplasm of unspecified site of unspecified female breast: Secondary | ICD-10-CM | POA: Diagnosis not present

## 2020-06-06 DIAGNOSIS — C50211 Malignant neoplasm of upper-inner quadrant of right female breast: Secondary | ICD-10-CM | POA: Insufficient documentation

## 2020-06-06 DIAGNOSIS — Z170 Estrogen receptor positive status [ER+]: Secondary | ICD-10-CM

## 2020-06-06 DIAGNOSIS — K838 Other specified diseases of biliary tract: Secondary | ICD-10-CM | POA: Diagnosis not present

## 2020-06-06 MED ORDER — GADOBUTROL 1 MMOL/ML IV SOLN
8.5000 mL | Freq: Once | INTRAVENOUS | Status: AC | PRN
  Administered 2020-06-06: 7 mL via INTRAVENOUS

## 2020-06-09 ENCOUNTER — Encounter: Payer: Self-pay | Admitting: Oncology

## 2020-06-09 ENCOUNTER — Other Ambulatory Visit: Payer: Self-pay | Admitting: Oncology

## 2020-06-21 ENCOUNTER — Inpatient Hospital Stay: Payer: BC Managed Care – PPO | Attending: Oncology | Admitting: Oncology

## 2020-06-21 ENCOUNTER — Inpatient Hospital Stay: Payer: BC Managed Care – PPO

## 2020-06-21 ENCOUNTER — Other Ambulatory Visit: Payer: Self-pay

## 2020-06-21 VITALS — BP 127/75 | HR 64 | Temp 97.8°F | Resp 18 | Ht 66.0 in | Wt 163.6 lb

## 2020-06-21 DIAGNOSIS — E042 Nontoxic multinodular goiter: Secondary | ICD-10-CM | POA: Insufficient documentation

## 2020-06-21 DIAGNOSIS — C50211 Malignant neoplasm of upper-inner quadrant of right female breast: Secondary | ICD-10-CM

## 2020-06-21 DIAGNOSIS — Z923 Personal history of irradiation: Secondary | ICD-10-CM | POA: Insufficient documentation

## 2020-06-21 DIAGNOSIS — Z9221 Personal history of antineoplastic chemotherapy: Secondary | ICD-10-CM | POA: Diagnosis not present

## 2020-06-21 DIAGNOSIS — Z170 Estrogen receptor positive status [ER+]: Secondary | ICD-10-CM

## 2020-06-21 DIAGNOSIS — Z79810 Long term (current) use of selective estrogen receptor modulators (SERMs): Secondary | ICD-10-CM | POA: Insufficient documentation

## 2020-06-21 LAB — CBC WITH DIFFERENTIAL/PLATELET
Abs Immature Granulocytes: 0.01 10*3/uL (ref 0.00–0.07)
Basophils Absolute: 0 10*3/uL (ref 0.0–0.1)
Basophils Relative: 0 %
Eosinophils Absolute: 0.2 10*3/uL (ref 0.0–0.5)
Eosinophils Relative: 3 %
HCT: 42.3 % (ref 36.0–46.0)
Hemoglobin: 14.4 g/dL (ref 12.0–15.0)
Immature Granulocytes: 0 %
Lymphocytes Relative: 20 %
Lymphs Abs: 1 10*3/uL (ref 0.7–4.0)
MCH: 30.1 pg (ref 26.0–34.0)
MCHC: 34 g/dL (ref 30.0–36.0)
MCV: 88.5 fL (ref 80.0–100.0)
Monocytes Absolute: 0.5 10*3/uL (ref 0.1–1.0)
Monocytes Relative: 10 %
Neutro Abs: 3.3 10*3/uL (ref 1.7–7.7)
Neutrophils Relative %: 67 %
Platelets: 185 10*3/uL (ref 150–400)
RBC: 4.78 MIL/uL (ref 3.87–5.11)
RDW: 12.5 % (ref 11.5–15.5)
WBC: 5 10*3/uL (ref 4.0–10.5)
nRBC: 0 % (ref 0.0–0.2)

## 2020-06-21 LAB — COMPREHENSIVE METABOLIC PANEL
ALT: 14 U/L (ref 0–44)
AST: 18 U/L (ref 15–41)
Albumin: 4.1 g/dL (ref 3.5–5.0)
Alkaline Phosphatase: 73 U/L (ref 38–126)
Anion gap: 10 (ref 5–15)
BUN: 11 mg/dL (ref 6–20)
CO2: 27 mmol/L (ref 22–32)
Calcium: 9.6 mg/dL (ref 8.9–10.3)
Chloride: 108 mmol/L (ref 98–111)
Creatinine, Ser: 0.77 mg/dL (ref 0.44–1.00)
GFR, Estimated: >60 mL/min (ref >60)
Glucose, Bld: 85 mg/dL (ref 70–99)
Potassium: 3.5 mmol/L (ref 3.5–5.1)
Sodium: 145 mmol/L (ref 135–145)
Total Bilirubin: 0.7 mg/dL (ref 0.3–1.2)
Total Protein: 6.5 g/dL (ref 6.5–8.1)

## 2020-06-21 NOTE — Progress Notes (Signed)
[place]  [phone] [phone]    ID: Debbie [name] DOB: 1961/08/17  MR#: [id number]  RAX#:[id number]  Patient Care Team: Lavada Mesi as PCP - General (Family Medicine) Alphonsa Overall, MD as Consulting Physician (General Surgery) Areeb Corron, Virgie Dad, MD as Consulting Physician (Oncology) Gery Pray, MD as Consulting Physician (Radiation Oncology) Burden, Lincoln Brigham, MD as Referring Physician (Ophthalmology) Larey Dresser, MD as Consulting Physician (Cardiology) Mauro Kaufmann, RN as Oncology Nurse Navigator Rockwell Germany, RN as Oncology Nurse Navigator OTHER MD: Earl Lagos, MD (Ophthalmology)   CHIEF COMPLAINT: Estrogen receptor positive breast cancer  CURRENT TREATMENT: tamoxifen   INTERVAL HISTORY: [name] returns today for follow-up and treatment of her HER-2 positive breast cancer.   She continues on Tamoxifen.  She has minimal problems with hot flashes and vaginal dryness is not an issue.  Since her last visit, she underwent biopsy of the right breast and axilla areas in question from mammogram 01/03/2020 on 01/12/2020. Pathology from the procedure [phone]) showed fibrosis.  She also underwent liver MRI on 06/06/2020 showing: stable tiny scattered T2 bright lesions in anterior aspect of liver most consistent with benign hepatic cysts or benign biliary hamartomas; no findings suspicious for hepatic metastatic disease; no acute abdominal findings or lymphadenopathy.   REVIEW OF SYSTEMS: [facility]bbie [name] is back to exercising about 3 times a week, 2 days doing kickboxing in 1 day doing strength.  She does not like to walk for exercise.  She just had an abscess taking care of and is currently taking doxy for that.  Otherwise a detailed review of systems today was "fine".   COVID 19 VACCINATION STATUS: full vaccinated Therapist, music) x2, most recently April 2021   HISTORY OF CURRENT ILLNESS: From the original intake  note:  [place] had routine screening mammography on 10/21/2018 showing a possible abnormality in the right breast. She felt a lump in her breast, possibly dating back to 01/2018, but she didn't think anything about it due to her history with fibrous breasts; she has had fibrous breasts all her life and began to have mammograms at the age of 4.   She underwent right breast ultrasonography at [facility] on 10/28/2018 showing: On physical exam, a firm, fixed mass is palpated in the superior right breast. Targeted ultrasound is performed, showing an irregular hypoechoic mass at the 1 o'clock position 4 cm from the nipple. It measures 2.8 x 2.4 x 1.8 cm. There is associated peripheral and internal vascularity. An oval, circumscribed hypoechoic mass is also identified within the vicinity at the 1 o'clock position 4 cm from the nipple. It measures 1.0 x 0.9 x 0.4 cm. This corresponds with an additional mammographically identified mass in the upper inner quadrant at middle depth. This is mammographically stable dating back to at least 2014. Additional stable, circumscribed masses are scattered throughout the bilateral breasts mammographically. Evaluation of the right axilla demonstrates a single, markedly abnormal lymph node with cortical thickening up to 1 cm. No additional suspicious lymphadenopathy is identified.  Accordingly on 10/28/2018 she proceeded to biopsy of the right breast area in question. The pathology from this procedure showed ([id number]): invasive ductal carcinoma, nottingham grade II of III, 2.8 cm, 1 o'clock, 4.0 cm from the nipple. Prognostic indicators significant for: estrogen receptor, 90% positive, with strong staining intensity and progesterone receptor, 0% negative. Proliferation marker Ki67 at 20%. HER2 Positive (3+) by immunohistochemistry.   An additional biopsy of the right axilla was performed  on the same day ([id number]) showing:  2. Lymph node, needle/core  biopsy, inferior right axilla - Tumor cells within fragmented nodal tissue  The patient's subsequent history is as detailed above.   PAST MEDICAL HISTORY: Past Medical History:  Diagnosis Date  . Anxiety   . Asthma    with severe URIs  . Bronchitis   . Essential hypertension, benign 12/01/2012  . Generalized anxiety disorder 12/01/2012  . GERD (gastroesophageal reflux disease)   . Hypertension    lost over 100lbs, no BP meds now, HCTZ is for edema  . Irritable bowel syndrome (IBS)    "had for years," per patient  . Personal history of chemotherapy   . Personal history of radiation therapy   . PONV (postoperative nausea and vomiting)   . Seasonal allergies   . Sleep apnea    uses CPAP nightly    PAST SURGICAL HISTORY: Past Surgical History:  Procedure Laterality Date  . ABDOMINAL HYSTERECTOMY  2011   with vaginal sling  . ANKLE SURGERY    . BREAST CYST EXCISION Bilateral   . BREAST EXCISIONAL BIOPSY Bilateral   . BREAST LUMPECTOMY Right 03/2019  . BREAST LUMPECTOMY WITH RADIOACTIVE SEED AND AXILLARY LYMPH NODE DISSECTION Right 04/15/2019   Procedure: RIGHT BREAST LUMPECTOMY WITH RADIOACTIVE SEED AND  TARGETED RIGHT AXILLARY LYMPH NODE DISSECTION WITH RADIOACTIVE SEED;  Surgeon: Alphonsa Overall, MD;  Location: [name];  Service: General;  Laterality: Right;  . CHOLECYSTECTOMY  2012  . KNEE ARTHROSCOPY W/ MEDIAL COLLATERAL LIGAMENT (MCL) REPAIR Left 10-07-2012  . PORTACATH PLACEMENT N/A 11/16/2018   Procedure: INSERTION PORT-A-CATH WITH ULTRASOUND;  Surgeon: Alphonsa Overall, MD;  Location: [facility];  Service: General;  Laterality: N/A;  . TONSILLECTOMY  1969    FAMILY HISTORY: Family History  Problem Relation Age of Onset  . Hypertension Mother   . Rheum arthritis Mother   . Heart failure Mother   . Hypertension Father   . Alcoholism Father   . Colon cancer Father   . Lung cancer Father   . Depression Father   . Diabetes Father   . Skin cancer Father   .  Hypertension Brother   . Stroke Brother   . Atrial fibrillation Brother   . Hyperlipidemia Maternal Grandfather   . Stroke Paternal Grandmother   . Skin cancer Paternal Grandmother   . Healthy Daughter   . Healthy Daughter   . Breast cancer Neg Hx   . Ovarian cancer Neg Hx   . Prostate cancer Neg Hx   . Pancreatic cancer Neg Hx    Debbie [name]'s father died from lung cancer at age 85. Patients' mother died from congestive heart failure at age 3. The patient has 1 brother. Patient denies anyone in her family having breast, ovarian, prostate, or pancreatic cancer. [name]'s father was diagnosed with skin cancer, colon cancer, and lung cancer. [name]'s paternal grandmother was diagnosed with skin cancer.    GYNECOLOGIC HISTORY:  No LMP recorded. Patient has had a hysterectomy. 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 Her husband, [place] Latulippe, is in [employer]	[place]. They have two daughters, Kerri Perches and Walden. [address] Dawna Part is 24, lives in [place], [name], and works in Building surveyor. [place]  is 64, lives in [place], [name], and is getting married in 2021  ADVANCED DIRECTIVES: In the absence of any documentation, Ketara's spouse, [name], is her healthcare power of attorney.      HEALTH MAINTENANCE: Social History   Tobacco Use  . Smoking status: Never Smoker  . Smokeless tobacco: Never Used  Vaping Use  . Vaping Use: Never used  Substance Use Topics  . Alcohol use: Yes    Comment: rare  . Drug use: No    Colonoscopy: yes, 2015, [facility]   PAP: 2011  Bone density: 07/2019, -1.8 Mammography: 10/21/2018  Allergies  Allergen Reactions  . Azithromycin Diarrhea  . Erythromycin  Itching  . Erythromycin Base Itching  . Levofloxacin     Tendon pain  . Penicillin G Rash, Swelling and Hives  . Penicillins Swelling and Rash    Did it involve swelling of the face/tongue/throat, SOB, or low BP? Yes Did it involve sudden or severe rash/hives, skin peeling, or any reaction on the inside of your mouth or nose? No Did you need to seek medical attention at a hospital or doctor's office? Yes When did it last happen?40+ years ago If all above answers are "NO", may proceed with cephalosporin use.     Current Outpatient Medications  Medication Sig Dispense Refill  . AMBULATORY NON FORMULARY MEDICATION Continuous positive airway pressure (CPAP) machine set at autopap, with all supplemental supplies as needed. 1 each 0  . calcium-vitamin D (OSCAL WITH D) 500-200 MG-UNIT tablet Take 1 tablet by mouth daily with breakfast.    . loratadine (CLARITIN) 10 MG tablet Take 10 mg by mouth daily as needed.     Marland Kitchen losartan (COZAAR) 50 MG tablet TAKE 1 TABLET DAILY 90 tablet 3  . montelukast (SINGULAIR) 10 MG tablet Take 1 tablet (10 mg total) by mouth at bedtime. 90 tablet 3  . Multiple Vitamin (MULTIVITAMIN WITH MINERALS) TABS tablet Take 1 tablet by mouth daily.    . potassium chloride (MICRO-K) 10 MEQ CR capsule TAKE 2 CAPSULES DAILY (CHANGE IN DOSAGE OR PILL SIZE) 180 capsule 3  . PROAIR HFA 108 (90 Base) MCG/ACT inhaler Inhale 2 puffs into the lungs every 4 (four) hours as needed for wheezing or shortness of breath.    . tamoxifen (NOLVADEX) 20 MG tablet Take 20 mg by mouth daily.    . valACYclovir (VALTREX) 1000 MG tablet TAKE 1 TABLET TWICE A DAY AS NEEDED FOR COLD SORE 60 tablet 11  . zolpidem (AMBIEN) 5 MG tablet Take 1 tablet (5 mg total) by mouth at bedtime as needed for sleep. 20 tablet 0   No current facility-administered medications for this visit.     OBJECTIVE:  white woman who appears well  Vitals:   06/21/20 1514  BP: 127/75  Pulse: 64  Resp: 18  Temp: 97.8  F (36.6 C)  SpO2: 100%     Body mass index is 26.41 kg/m.   Wt Readings from Last 3 Encounters:  06/21/20 163 lb 9.6 oz (74.2 kg)  04/28/20 165 lb (74.8 kg)  01/04/20 186 lb 4.8 oz (84.5 kg)  ECOG FS:1 - Symptomatic but completely ambulatory  Sclerae unicteric, EOMs intact Wearing a mask No cervical or supraclavicular adenopathy Lungs no rales or rhonchi Heart regular rate and rhythm Abd soft, nontender, positive bowel sounds MSK no focal spinal tenderness, no upper extremity lymphedema Neuro: nonfocal, well oriented, appropriate affect Breasts: The right breast is status post lumpectomy and radiation.  There is still some  hyperpigmentation and some coarseness.  This is expected.  Overall the cosmetic result is good.  Left breast is benign.  Both axillae are benign.   LAB RESULTS:  CMP     Component Value Date/Time   NA 145 06/21/2020 1453   K 3.5 06/21/2020 1453   CL 108 06/21/2020 1453   CO2 27 06/21/2020 1453   GLUCOSE 85 06/21/2020 1453   BUN 11 06/21/2020 1453   CREATININE 0.77 06/21/2020 1453   CREATININE 0.71 01/20/2019 0752   CREATININE 0.66 07/08/2017 1127   CALCIUM 9.6 06/21/2020 1453   PROT 6.5 06/21/2020 1453   ALBUMIN 4.1 06/21/2020 1453   AST 18 06/21/2020 1453   AST 42 (H) 01/20/2019 0752   ALT 14 06/21/2020 1453   ALT 42 01/20/2019 0752   ALKPHOS 73 06/21/2020 1453   BILITOT 0.7 06/21/2020 1453   BILITOT 0.5 01/20/2019 0752   GFRNONAA >60 06/21/2020 1453   GFRNONAA >60 01/20/2019 0752   GFRNONAA 98 07/08/2017 1127   GFRAA >60 01/04/2020 1258   GFRAA >60 01/20/2019 0752   GFRAA 114 07/08/2017 1127    No results found for: TOTALPROTELP, ALBUMINELP, A1GS, A2GS, BETS, BETA2SER, GAMS, MSPIKE, SPEI  No results found for: KPAFRELGTCHN, LAMBDASER, KAPLAMBRATIO  Lab Results  Component Value Date   WBC 5.0 06/21/2020   NEUTROABS 3.3 06/21/2020   HGB 14.4 06/21/2020   HCT 42.3 06/21/2020   MCV 88.5 06/21/2020   PLT 185 06/21/2020    No results  found for: LABCA2  No components found for: [id number]  No results for input(s): INR in the last 168 hours.  No results found for: LABCA2  No results found for: HYI502  No results found for: DXA128  No results found for: NOM767  No results found for: CA2729  No components found for: HGQUANT  No results found for: CEA1 / No results found for: CEA1   No results found for: AFPTUMOR  No results found for: CHROMOGRNA  No results found for: HGBA, HGBA2QUANT, HGBFQUANT, HGBSQUAN (Hemoglobinopathy evaluation)   No results found for: LDH  Lab Results  Component Value Date   IRON 46 05/11/2013   TIBC 254 05/11/2013   IRONPCTSAT 18 (L) 05/11/2013   (Iron and TIBC)  Lab Results  Component Value Date   FERRITIN 72 05/11/2013    Urinalysis    Component Value Date/Time   BILIRUBINUR Small 11/09/2018 0914   PROTEINUR Negative 11/09/2018 0914   UROBILINOGEN 0.2 11/09/2018 0914   NITRITE Negative 11/09/2018 0914   LEUKOCYTESUR Negative 11/09/2018 0914     STUDIES:  MR LIVER W WO CONTRAST  Result Date: 06/07/2020 CLINICAL DATA:  Breast cancer. Follow-up liver lesions seen on prior MRI. EXAM: MRI ABDOMEN WITHOUT AND WITH CONTRAST TECHNIQUE: Multiplanar multisequence MR imaging of the abdomen was performed both before and after the administration of intravenous contrast. CONTRAST:  18m GADAVIST GADOBUTROL 1 MMOL/ML IV SOLN COMPARISON:  MRI 12/24/2018 FINDINGS: Lower chest: The lung bases are clear an acute process. No pulmonary lesions, pleural or pericardial effusion. Hepatobiliary: There are few tiny scattered T2 bright lesions in the anterior aspect of the liver. These are unchanged. Difficult to identified the postcontrast images but do not see any definite enhancement. Findings most consistent with benign hepatic cysts or benign biliary hamartoma. No worrisome hepatic lesions are identified to suggest metastatic disease. No intrahepatic biliary dilatation. Status post  cholecystectomy. Stable common bile duct dilatation. It measures 11 mm in the porta hepatis and 10 mm in the head of  the pancreas. No common bile duct stones are identified. Pancreas:  No mass, inflammation or ductal dilatation. Spleen:  Normal size.  No focal lesions. Adrenals/Urinary Tract: The adrenal glands and kidneys are unremarkable and stable. Stomach/Bowel: The stomach, duodenum, small bowel and colon are grossly normal. Vascular/Lymphatic: The aorta and branch vessels are patent. The major venous structures are patent. No mesenteric or retroperitoneal mass or adenopathy. Other:  No ascites or abdominal wall hernia. Musculoskeletal: No worrisome bone lesions. IMPRESSION: 1. Stable tiny scattered T2 bright lesions in the anterior aspect of the liver most consistent with benign hepatic cysts or benign biliary hamartomas. No findings suspicious for hepatic metastatic disease. 2. Stable common bile duct dilatation likely related to prior cholecystectomy. No common bile duct stones. 3. No acute abdominal findings or lymphadenopathy. Electronically Signed   By: Marijo Sanes M.D.   On: 06/07/2020 15:05     ELIGIBLE FOR AVAILABLE RESEARCH PROTOCOL: no   ASSESSMENT: 59 y.o. [name], [name] woman status post right breast upper inner quadrant biopsy 10/28/2018 for a clinical T2 N1, stage IIA invasive ductal carcinoma, grade 2, estrogen receptor positive, progesterone receptor negative, HER-2 amplified, with an MIB-1 of 20%  (a) staging work-up with CT scans of the chest abdomen and pelvis and thyroid ultrasonography shows multiple findings that will require follow-up:   (i) two 0.5 cm liver lesions   (ii) possible bladder mass, thickened sigmoid and rectum, and prominent adnexa   (iii) thyroid nodule biopsied 11/19/2018, read as [place] 3 (risk of malignancy 5-15%)   (iiii)  thyroid lymph node biopsied 11/19/2018 showing no evidence of carcinoma   (iiiii) abd MRI 05/07 2020 confirms two tiny liver  lesions. No bladder or adnexal malignancy  (1) neoadjuvant chemotherapy consisting of carboplatin, docetaxel, trastuzumab, and pertuzumab every 21 days x 6 started 11/17/2018, copleted 03/02/2019  (a) pertuzumab discontinued after cycle 1 due to severe diarrhea  (2) trastuzumab continued to complete 1 year (last dose 10/19/2019).).  (a) baseline echocardiogram 11/11/2018 showed an ejection fraction in the 55-60% range  (b) echocardiogram 03/10/2019 shows EF of 60-65%  (c) echo 06/08/2019 shows EF 60%  (d) echo 09/14/2018 shows an ejection fraction in the 60-65% range.  (e) echo 12/14/2019 shows an ejection fraction in the 60-65% range  (3) Right breast lumpectomy on 04/15/2019: no residual carcinoma (ypT0 ypN0)  (a) a total of 4 lymph nodes were removed  (4) adjuvant radiation 05/31/2019 - 07/14/2019 Site Technique Total Dose (Gy) Dose per Fx (Gy) Completed Fx Beam Energies  Breast: Breast_Rt 3D 50.4/50.4 1.8 28/28 6X, 10X  Breast: Breast_Rt_axilla 3D 45/45 1.8 25/25 6X, 10X   (5) started tamoxifen 08/20/2019  (a) s/p hysterectomy  (b) bone density 07/30/2019 shows a T score of -1.8   (c) used oral contraceptives for 7 or 8 years in the past with no complications  (6) thyroid nodules followed by Dr Armandina Gemma  (7) very small liver abnormalities noted on CT March 2020 and MRI May 2020 require follow-up   (a) repeat liver MRI 06/06/2020 shows these to be small harmartomas or cysts   PLAN: Taleeya is now a little over a year out from definitive surgery for her breast cancer with no evidence of disease recurrence.  This is favorable.  She is tolerating tamoxifen well and the plan will be to continue that a total of 5 years.  We reviewed the MRI of the liver.  I do not think this needs to be repeated, these are clearly benign lesions.  I commended  her excellent exercise program.  We also discussed a vaccine booster and I do not think she needs one at this time being only 59 years  old  She will see me again in 1 year.  She knows to call for any other issue that may develop before then.   Encounter time 25 minutes.Sarajane Jews C. Jewell Ryans, MD Medical Oncology and Hematology [facility] [place], [place] [address] Tel. [phone]    Fax. [phone]   I, Wilburn Mylar, am acting as scribe for Dr. Virgie Dad. Andrian Sabala.  I, Lurline Del MD, have reviewed the above documentation for accuracy and completeness, and I agree with the above.   *Total Encounter Time as defined by the Centers for Medicare and Medicaid Services includes, in addition to the face-to-face time of a patient visit (documented in the note above) non-face-to-face time: obtaining and reviewing outside history, ordering and reviewing medications, tests or procedures, care coordination (communications with other health care professionals or caregivers) and documentation in the medical record.

## 2020-06-23 ENCOUNTER — Telehealth: Payer: Self-pay | Admitting: Oncology

## 2020-06-23 DIAGNOSIS — Z800 Family history of malignant neoplasm of digestive organs: Secondary | ICD-10-CM | POA: Diagnosis not present

## 2020-06-23 DIAGNOSIS — Z1211 Encounter for screening for malignant neoplasm of colon: Secondary | ICD-10-CM | POA: Diagnosis not present

## 2020-06-23 DIAGNOSIS — K5730 Diverticulosis of large intestine without perforation or abscess without bleeding: Secondary | ICD-10-CM | POA: Diagnosis not present

## 2020-06-23 LAB — HM COLONOSCOPY

## 2020-06-23 NOTE — Telephone Encounter (Signed)
Scheduled appt per 11/3 los - mailed reminder letter with appt date and time

## 2020-07-28 DIAGNOSIS — G4733 Obstructive sleep apnea (adult) (pediatric): Secondary | ICD-10-CM | POA: Diagnosis not present

## 2020-08-15 ENCOUNTER — Encounter: Payer: Self-pay | Admitting: Physician Assistant

## 2020-08-25 ENCOUNTER — Ambulatory Visit (INDEPENDENT_AMBULATORY_CARE_PROVIDER_SITE_OTHER): Payer: BC Managed Care – PPO | Admitting: Physician Assistant

## 2020-08-25 ENCOUNTER — Other Ambulatory Visit: Payer: Self-pay

## 2020-08-25 ENCOUNTER — Encounter: Payer: Self-pay | Admitting: Physician Assistant

## 2020-08-25 VITALS — BP 134/63 | HR 62 | Ht 66.0 in | Wt 163.4 lb

## 2020-08-25 DIAGNOSIS — F439 Reaction to severe stress, unspecified: Secondary | ICD-10-CM | POA: Diagnosis not present

## 2020-08-25 DIAGNOSIS — I10 Essential (primary) hypertension: Secondary | ICD-10-CM | POA: Diagnosis not present

## 2020-08-25 DIAGNOSIS — R000 Tachycardia, unspecified: Secondary | ICD-10-CM

## 2020-08-25 MED ORDER — VENLAFAXINE HCL ER 75 MG PO CP24: 75.0000 mg | ORAL_CAPSULE | Freq: Every day | ORAL | 0 refills | Status: DC

## 2020-08-25 MED ORDER — LOSARTAN POTASSIUM 100 MG PO TABS: 100.0000 mg | ORAL_TABLET | Freq: Every day | ORAL | 1 refills | Status: DC

## 2020-08-25 NOTE — Progress Notes (Signed)
Subjective:    Patient ID: [name], female    DOB: 06-28-1961, 59 y.o.   MRN: [id number]  HPI  Patient is a 60 year old female with hypertension, OSA with recent history of breast cancer in remission who presents to clinic to follow-up on hypertension.  Patient continues to be an intervention from breast cancer.  Her last follow-up was in November 2021.  She noticed at the end of November and December her blood pressure readings were increasing from her 120s over 70s or 80s to 150s and 60s over 90s.  She denies any chest pain, palpitations, headaches.  She has noticed some episodes of increased heart rate.  The only thing that has changed is a lot of stress in her life.  She denies any suicidal thoughts or homicidal idealizations.  She did restart some Effexor that she had from her cancer treatment at 37.5 mg.  She has not noticed any major improvements with this.  She did tolerate it well during her cancer treatments.  pt did call into the office when her blood pressure started to rise and she was instructed to increase her Cozaar to 100 mg.  That has worked well with her blood pressure control.  Her BPs are ranging in the 120s over 80s again.  Patient does sleep with a CPAP and uses it nightly with no complication.  .. Active Ambulatory Problems    Diagnosis Date Noted   Essential hypertension, benign 12/01/2012   Family history of colon cancer 12/01/2012   History of hysterectomy 01/04/2013   History of shingles 01/04/2013   Gastritis 02/17/2013   Class 1 obesity due to excess calories with serious comorbidity and body mass index (BMI) of 30.0 to 30.9 in adult 03/05/2013   Elevated LFTs 05/11/2013   Labral tear of shoulder 08/29/2014   Large breasts 06/26/2016   Primary insomnia 06/26/2016   Mid back pain 06/26/2016   Chronic shoulder pain 06/26/2016   Fungal infection of toenail 01/17/2017   Chest tightness 07/09/2017   SOB (shortness of breath)  07/09/2017   Cough 07/09/2017   Snoring 10/20/2017   Rectocele 10/20/2017   Non-restorative sleep 10/20/2017   No energy 10/20/2017   Reactive airway disease, mild intermittent, uncomplicated 26/71/2458   Periodic limb movement 11/23/2017   OSA (obstructive sleep apnea) 11/23/2017   Nocturnal hypoxemia 11/23/2017   Bilateral hand pain 01/23/2018   Hand joint stiff, unspecified laterality 01/23/2018   Positive ANA (antinuclear antibody) 02/06/2018   Hypotension 04/29/2018   Family history of stroke 04/29/2018   Primary osteoarthritis of both hands 04/29/2018   Primary osteoarthritis of both feet 04/29/2018   Multiple food allergies 07/20/2018   Malignant neoplasm of upper-inner quadrant of right breast in female, estrogen receptor positive ([place]) 11/02/2018   Liver nodule 11/11/2018   Abnormal CT of the abdomen 11/11/2018   Liver lesion 11/13/2018   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   Sigmoid thickening 11/13/2018   Bladder mass 11/13/2018   Thyroid nodule 11/13/2018   Enlarged lymph nodes 11/13/2018   Port-A-Cath in place 12/29/2018   Stress 08/28/2020   Tachycardia 08/28/2020   Resolved Ambulatory Problems    Diagnosis Date Noted   Generalized anxiety disorder 12/01/2012   Past Medical History:  Diagnosis Date   Anxiety    Asthma    Bronchitis    GERD (gastroesophageal reflux disease)    Hypertension    Irritable bowel syndrome (IBS)    Personal history of chemotherapy  Personal history of radiation therapy    PONV (postoperative nausea and vomiting)    Seasonal allergies    Sleep apnea       Review of Systems  All other systems reviewed and are negative.      Objective:   Physical Exam Vitals reviewed.  Constitutional:      Appearance: Normal appearance.  Neck:     Vascular: No carotid bruit.  Cardiovascular:     Rate and Rhythm: Normal rate and regular rhythm.     Pulses: Normal  pulses.     Heart sounds: Murmur heard.    Pulmonary:     Effort: Pulmonary effort is normal.     Breath sounds: Normal breath sounds.  Lymphadenopathy:     Cervical: No cervical adenopathy.  Neurological:     General: No focal deficit present.     Mental Status: She is alert and oriented to person, place, and time.  Psychiatric:        Mood and Affect: Mood normal.        Behavior: Behavior normal.       Assessment & Plan:  Marland KitchenMarland KitchenPeggie was seen today for hypertension.  Diagnoses and all orders for this visit:  Essential hypertension, benign -     losartan (COZAAR) 100 MG tablet; Take 1 tablet (100 mg total) by mouth daily. -     COMPLETE METABOLIC PANEL WITH GFR  Stress -     venlafaxine XR (EFFEXOR XR) 75 MG 24 hr capsule; Take 1 capsule (75 mg total) by mouth daily with breakfast.  Tachycardia -     TSH -     CBC with Differential/Platelet -     COMPLETE METABOLIC PANEL WITH GFR   Other than stress no explanation of increased BP.  Pt does have murmur last echo 2021. No concerns. No signs of fluid overload.  Will check TSH/CBC/CMP. Stay at cozaar 100mg  for BP control.  Increased effexor to 75mg  for mood/stress control. Consider counseling and regular exercise/meditation.  Follow up in 3 months for BP recheck.

## 2020-08-25 NOTE — Patient Instructions (Signed)

## 2020-08-26 LAB — CBC WITH DIFFERENTIAL/PLATELET
Absolute Monocytes: 442 cells/uL (ref 200–950)
Basophils Absolute: 19 cells/uL (ref 0–200)
Basophils Relative: 0.4 %
Eosinophils Absolute: 202 cells/uL (ref 15–500)
Eosinophils Relative: 4.3 %
HCT: 40.6 % (ref 35.0–45.0)
Hemoglobin: 14.2 g/dL (ref 11.7–15.5)
Lymphs Abs: 785 cells/uL — ABNORMAL LOW (ref 850–3900)
MCH: 31.1 pg (ref 27.0–33.0)
MCHC: 35 g/dL (ref 32.0–36.0)
MCV: 89 fL (ref 80.0–100.0)
MPV: 9 fL (ref 7.5–12.5)
Monocytes Relative: 9.4 %
Neutro Abs: 3252 cells/uL (ref 1500–7800)
Neutrophils Relative %: 69.2 %
Platelets: 202 10*3/uL (ref 140–400)
RBC: 4.56 10*6/uL (ref 3.80–5.10)
RDW: 12 % (ref 11.0–15.0)
Total Lymphocyte: 16.7 %
WBC: 4.7 10*3/uL (ref 3.8–10.8)

## 2020-08-26 LAB — COMPLETE METABOLIC PANEL WITH GFR
AG Ratio: 2.4 (calc) (ref 1.0–2.5)
ALT: 13 U/L (ref 6–29)
AST: 17 U/L (ref 10–35)
Albumin: 4.3 g/dL (ref 3.6–5.1)
Alkaline phosphatase (APISO): 68 U/L (ref 37–153)
BUN: 14 mg/dL (ref 7–25)
CO2: 30 mmol/L (ref 20–32)
Calcium: 9.3 mg/dL (ref 8.6–10.4)
Chloride: 107 mmol/L (ref 98–110)
Creat: 0.73 mg/dL (ref 0.50–0.99)
GFR, Est African American: 104 mL/min/{1.73_m2} (ref >=60)
GFR, Est Non African American: 90 mL/min/{1.73_m2} (ref >=60)
Globulin: 1.8 g/dL (calc) — ABNORMAL LOW (ref 1.9–3.7)
Glucose, Bld: 86 mg/dL (ref 65–99)
Potassium: 3.8 mmol/L (ref 3.5–5.3)
Sodium: 144 mmol/L (ref 135–146)
Total Bilirubin: 0.6 mg/dL (ref 0.2–1.2)
Total Protein: 6.1 g/dL (ref 6.1–8.1)

## 2020-08-26 LAB — TSH: TSH: 1.05 mIU/L (ref 0.40–4.50)

## 2020-08-28 ENCOUNTER — Other Ambulatory Visit: Payer: Self-pay

## 2020-08-28 ENCOUNTER — Ambulatory Visit: Payer: BC Managed Care – PPO | Admitting: Physician Assistant

## 2020-08-28 ENCOUNTER — Encounter: Payer: Self-pay | Admitting: Physician Assistant

## 2020-08-28 DIAGNOSIS — F439 Reaction to severe stress, unspecified: Secondary | ICD-10-CM

## 2020-08-28 DIAGNOSIS — R000 Tachycardia, unspecified: Secondary | ICD-10-CM | POA: Insufficient documentation

## 2020-08-28 DIAGNOSIS — R771 Abnormality of globulin: Secondary | ICD-10-CM

## 2020-08-28 HISTORY — DX: Reaction to severe stress, unspecified: F43.9

## 2020-08-28 HISTORY — DX: Tachycardia, unspecified: R00.0

## 2020-08-28 NOTE — Progress Notes (Signed)
Overall labs look great. Thyroid looks wonderful. Hemoglobin looks good. Globulin is a little low but has not been checked in a while and can be low in general when fighting cancers as you have. I do say we recheck in 1-2 months to make sure not continuing to drop.

## 2020-08-29 ENCOUNTER — Encounter: Payer: Self-pay | Admitting: Physician Assistant

## 2020-09-11 DIAGNOSIS — N62 Hypertrophy of breast: Secondary | ICD-10-CM | POA: Diagnosis not present

## 2020-09-11 DIAGNOSIS — E65 Localized adiposity: Secondary | ICD-10-CM | POA: Diagnosis not present

## 2020-09-11 DIAGNOSIS — L304 Erythema intertrigo: Secondary | ICD-10-CM | POA: Diagnosis not present

## 2020-09-11 DIAGNOSIS — Z853 Personal history of malignant neoplasm of breast: Secondary | ICD-10-CM | POA: Diagnosis not present

## 2020-10-17 ENCOUNTER — Other Ambulatory Visit: Payer: Self-pay | Admitting: Oncology

## 2020-10-19 ENCOUNTER — Encounter: Payer: Self-pay | Admitting: Physician Assistant

## 2020-10-27 ENCOUNTER — Other Ambulatory Visit: Payer: Self-pay | Admitting: Oncology

## 2020-10-27 DIAGNOSIS — Z9889 Other specified postprocedural states: Secondary | ICD-10-CM

## 2020-11-01 IMAGING — MG DIGITAL SCREENING BILATERAL MAMMOGRAM WITH TOMO AND CAD
6 of 10 series · 6 of 30 positions shown · non-contrast
Comparison: Previous exam(s).

CLINICAL DATA: Screening.

EXAM:
DIGITAL SCREENING BILATERAL MAMMOGRAM WITH TOMO AND CAD

[L CC synth-2D]
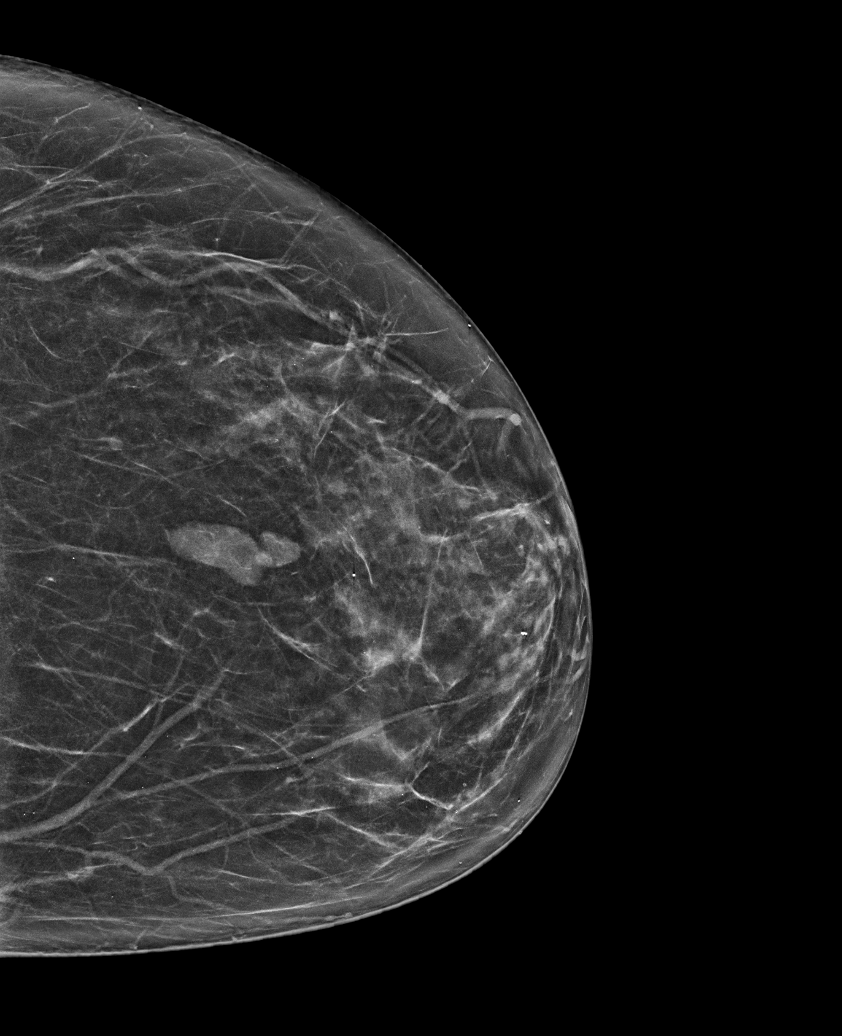

[L MLO synth-2D (1 of 2)]
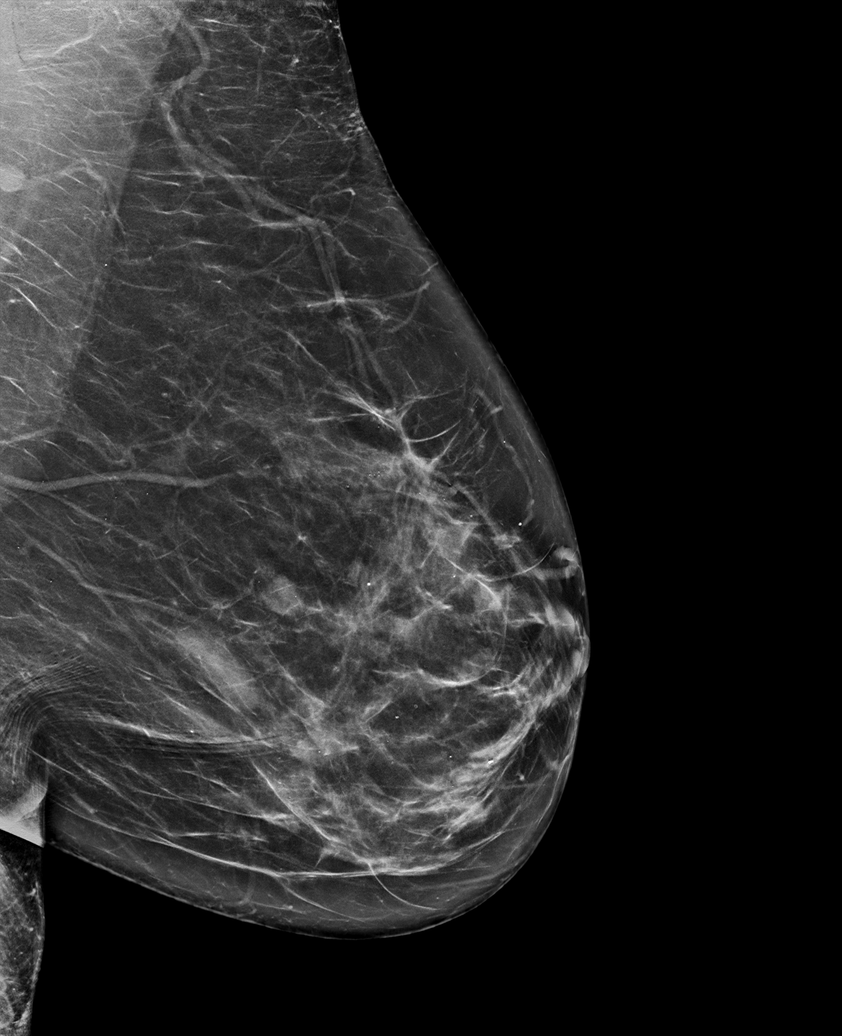

[L MLO synth-2D (2 of 2)]
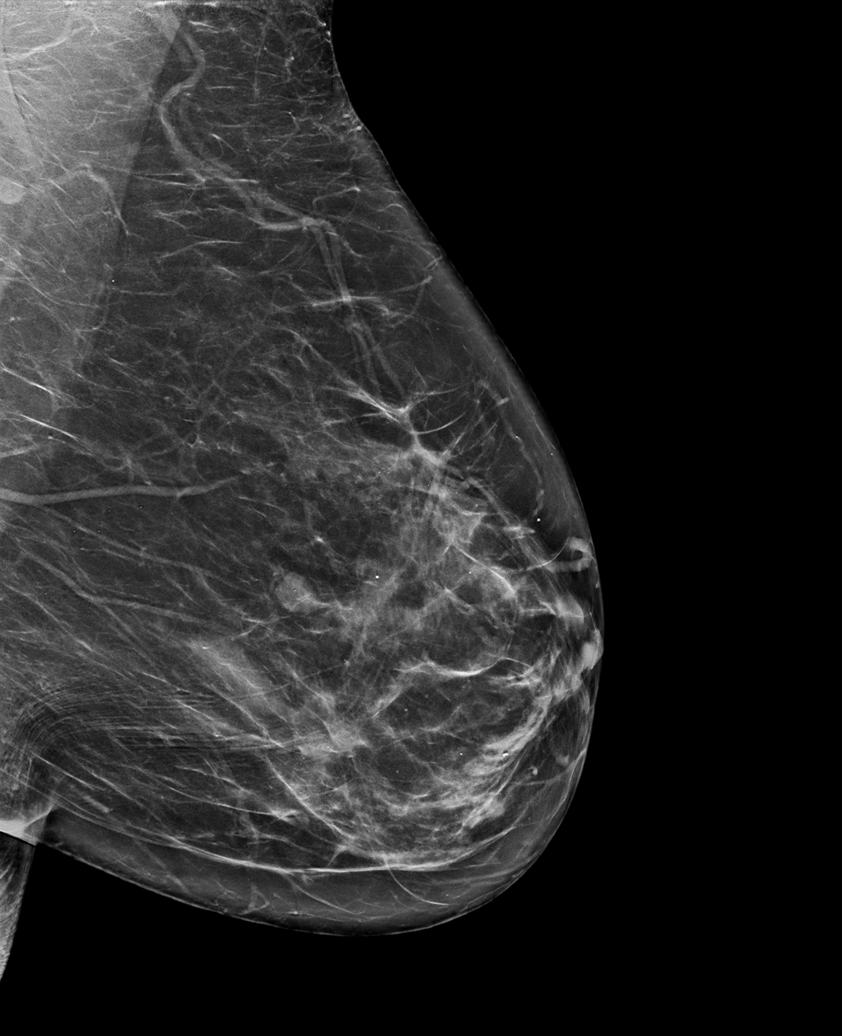

[R CC synth-2D]
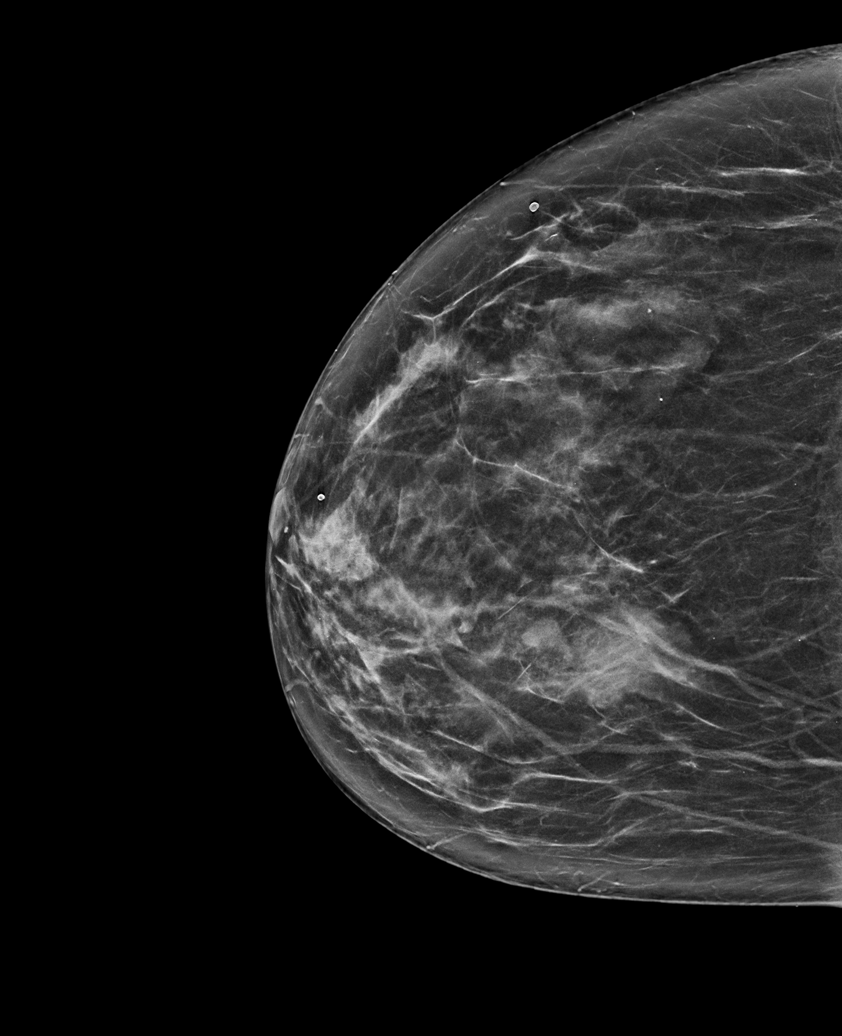

[R MLO synth-2D]
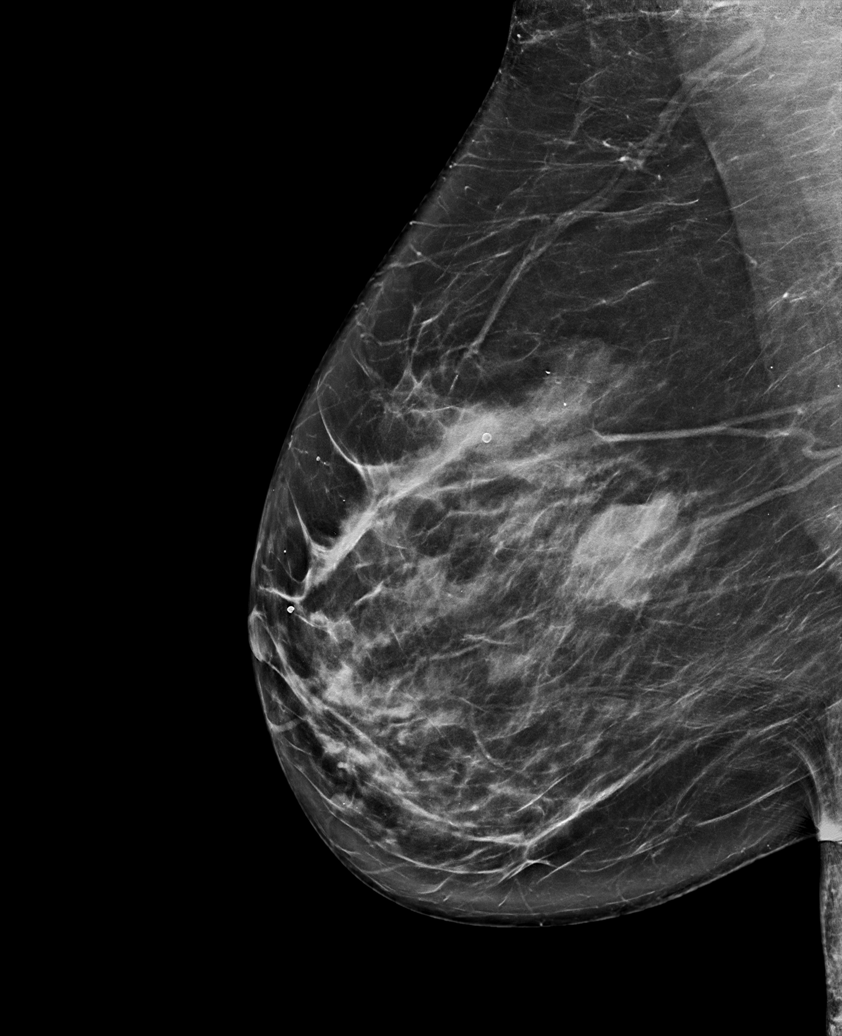

[R MLO tomo · tomo slice 41/80.0]
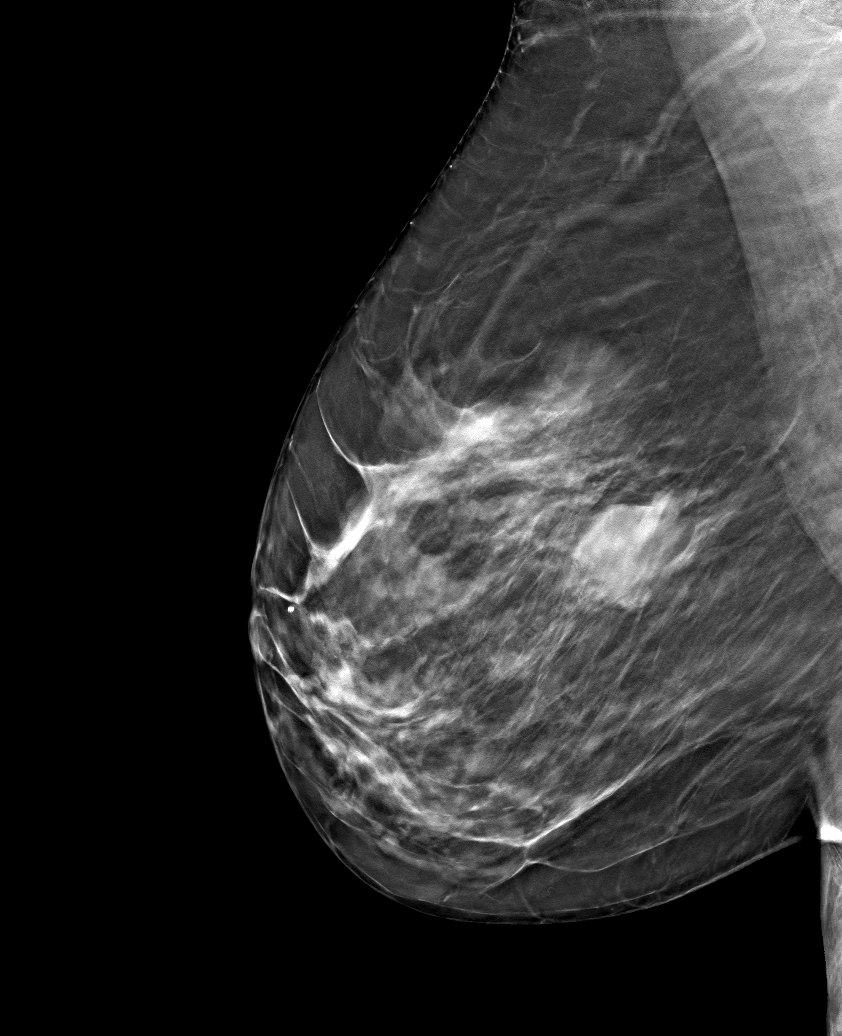

[6 of 30 positions shown; findings below may reference images not displayed]

ACR Breast Density Category b: There are scattered areas of
fibroglandular density.
FINDINGS: In the right breast, a possible mass warrants further evaluation. In
the left breast, no findings suspicious for malignancy. Images were
processed with CAD.
IMPRESSION: Further evaluation is suggested for possible mass in the right
breast.

RECOMMENDATION:
Ultrasound of the right breast. (Code:[id number]-UU2)

The patient will be contacted regarding the findings, and additional
imaging will be scheduled.

BI-RADS CATEGORY  0: Incomplete. Need additional imaging evaluation
and/or prior mammograms for comparison.

## 2020-11-10 ENCOUNTER — Other Ambulatory Visit: Payer: Self-pay | Admitting: Physician Assistant

## 2020-11-10 DIAGNOSIS — F439 Reaction to severe stress, unspecified: Secondary | ICD-10-CM

## 2020-11-14 ENCOUNTER — Other Ambulatory Visit: Payer: Self-pay

## 2020-11-14 ENCOUNTER — Ambulatory Visit
Admission: RE | Admit: 2020-11-14 | Discharge: 2020-11-14 | Disposition: A | Discharge status: Home / Self Care | Payer: BC Managed Care – PPO | Source: Ambulatory Visit | Attending: Oncology | Admitting: Oncology

## 2020-11-14 DIAGNOSIS — Z853 Personal history of malignant neoplasm of breast: Secondary | ICD-10-CM | POA: Diagnosis not present

## 2020-11-14 DIAGNOSIS — Z9889 Other specified postprocedural states: Secondary | ICD-10-CM

## 2020-11-14 DIAGNOSIS — R922 Inconclusive mammogram: Secondary | ICD-10-CM | POA: Diagnosis not present

## 2020-11-14 HISTORY — DX: Malignant neoplasm of unspecified site of unspecified female breast: C50.919

## 2020-11-15 DIAGNOSIS — Z853 Personal history of malignant neoplasm of breast: Secondary | ICD-10-CM | POA: Diagnosis not present

## 2020-11-17 HISTORY — PX: REDUCTION MAMMAPLASTY: SUR839

## 2020-11-20 DIAGNOSIS — Z01818 Encounter for other preprocedural examination: Secondary | ICD-10-CM | POA: Diagnosis not present

## 2020-11-23 ENCOUNTER — Other Ambulatory Visit: Payer: Self-pay | Admitting: Plastic Surgery

## 2020-11-23 DIAGNOSIS — Z853 Personal history of malignant neoplasm of breast: Secondary | ICD-10-CM | POA: Diagnosis not present

## 2020-11-23 DIAGNOSIS — N62 Hypertrophy of breast: Secondary | ICD-10-CM | POA: Diagnosis not present

## 2021-02-25 ENCOUNTER — Other Ambulatory Visit: Payer: Self-pay | Admitting: Physician Assistant

## 2021-02-25 DIAGNOSIS — I10 Essential (primary) hypertension: Secondary | ICD-10-CM

## 2021-04-04 DIAGNOSIS — N62 Hypertrophy of breast: Secondary | ICD-10-CM | POA: Diagnosis not present

## 2021-04-04 DIAGNOSIS — E65 Localized adiposity: Secondary | ICD-10-CM | POA: Diagnosis not present

## 2021-04-29 ENCOUNTER — Other Ambulatory Visit: Payer: Self-pay | Admitting: Physician Assistant

## 2021-04-29 DIAGNOSIS — F439 Reaction to severe stress, unspecified: Secondary | ICD-10-CM

## 2021-05-21 DIAGNOSIS — E65 Localized adiposity: Secondary | ICD-10-CM | POA: Diagnosis not present

## 2021-06-05 DIAGNOSIS — E65 Localized adiposity: Secondary | ICD-10-CM | POA: Diagnosis not present

## 2021-06-05 DIAGNOSIS — M793 Panniculitis, unspecified: Secondary | ICD-10-CM | POA: Diagnosis not present

## 2021-06-05 DIAGNOSIS — Z411 Encounter for cosmetic surgery: Secondary | ICD-10-CM | POA: Diagnosis not present

## 2021-06-15 ENCOUNTER — Other Ambulatory Visit: Payer: Self-pay | Admitting: Physician Assistant

## 2021-06-15 DIAGNOSIS — I10 Essential (primary) hypertension: Secondary | ICD-10-CM

## 2021-06-21 ENCOUNTER — Inpatient Hospital Stay: Payer: BC Managed Care – PPO | Admitting: Oncology

## 2021-06-21 ENCOUNTER — Inpatient Hospital Stay: Payer: BC Managed Care – PPO

## 2021-07-17 ENCOUNTER — Telehealth: Payer: Self-pay | Admitting: Oncology

## 2021-07-17 DIAGNOSIS — C50411 Malignant neoplasm of upper-outer quadrant of right female breast: Secondary | ICD-10-CM | POA: Diagnosis not present

## 2021-07-17 DIAGNOSIS — Z170 Estrogen receptor positive status [ER+]: Secondary | ICD-10-CM | POA: Diagnosis not present

## 2021-07-17 NOTE — Telephone Encounter (Signed)
Scheduled appointment per provider. Left message. 

## 2021-07-23 ENCOUNTER — Other Ambulatory Visit: Payer: Self-pay | Admitting: Physician Assistant

## 2021-07-23 DIAGNOSIS — F439 Reaction to severe stress, unspecified: Secondary | ICD-10-CM

## 2021-08-06 ENCOUNTER — Ambulatory Visit: Payer: BC Managed Care – PPO | Admitting: Oncology

## 2021-08-06 ENCOUNTER — Other Ambulatory Visit: Payer: BC Managed Care – PPO

## 2021-08-06 NOTE — Progress Notes (Signed)
[place]  Telephone:([phone] Fax:([phone]) [phone]    ID: [name] DOB: 12/11/1960  MR#: [id number]  QPY#:[id number]  Patient Care Team: Lavada Mesi as PCP - General (Family Medicine) Alphonsa Overall, MD as Consulting Physician (General Surgery) Victorian Gunn, Virgie Dad, MD as Consulting Physician (Oncology) Gery Pray, MD as Consulting Physician (Radiation Oncology) Burden, Lincoln Brigham, MD as Referring Physician (Ophthalmology) Larey Dresser, MD as Consulting Physician (Cardiology) Mauro Kaufmann, RN as Oncology Nurse Navigator Rockwell Germany, RN as Oncology Nurse Navigator OTHER MD: Earl Lagos, MD (Ophthalmology)   CHIEF COMPLAINT: Estrogen receptor positive breast cancer  CURRENT TREATMENT: tamoxifen   INTERVAL HISTORY: Katlyn returns today for follow-up of her HER-2 positive breast cancer.   She continues on Tamoxifen.  She does have night sweats.  These had stopped for about a year and then resumed about 3 months ago.  She is not aware of any particular reason for that pattern to have occurred.  She also has mild brownish vaginal discharge which is not symptomatic otherwise.  Since her last visit, she underwent bilateral diagnostic mammography with tomography at [facility] on 11/14/2020 showing: breast density category C; no evidence of malignancy in either breast.   She underwent mammoplasty on 11/23/2020 under Dr. Harlow Mares. Pathology from the procedure ([id number]) was benign.  She also had a panniculectomy a few months later, which she tolerated well.  Of note, she also underwent colonoscopy on 06/23/2020 under Dr. Shary Key showing mild diverticulosis. Given colon cancer in her father, she is recommended to undergo repeat colonoscopy in 5 years.   REVIEW OF SYSTEMS: Elisea has not gotten back to her kickboxing and weightlifting but she is getting ready to she says.  She is very glad she had the breast reduction.  Even  though it was more painful than she expected it pretty much took care of the problem she was having with neck pain and upper back pain.  A detailed review of systems today was otherwise stable.   COVID 19 VACCINATION STATUS: full vaccinated Therapist, music) x2, no booster as of December 2022  HISTORY OF CURRENT ILLNESS: From the original intake note:  [name] had routine screening mammography on 10/21/2018 showing a possible abnormality in the right breast. She felt a lump in her breast, possibly dating back to 01/2018, but she didn't think anything about it due to her history with fibrous breasts; she has had fibrous breasts all her life and began to have mammograms at the age of 59.   She underwent right breast ultrasonography at [facility] on 10/28/2018 showing: On physical exam, a firm, fixed mass is palpated in the superior right breast. Targeted ultrasound is performed, showing an irregular hypoechoic mass at the 1 o'clock position 4 cm from the nipple. It measures 2.8 x 2.4 x 1.8 cm. There is associated peripheral and internal vascularity. An oval, circumscribed hypoechoic mass is also identified within the vicinity at the 1 o'clock position 4 cm from the nipple. It measures 1.0 x 0.9 x 0.4 cm. This corresponds with an additional mammographically identified mass in the upper inner quadrant at middle depth. This is mammographically stable dating back to at least 2014. Additional stable, circumscribed masses are scattered throughout the bilateral breasts mammographically. Evaluation of the right axilla demonstrates a single, markedly abnormal lymph node with cortical thickening up to 1 cm. No additional suspicious lymphadenopathy is identified.  Accordingly on 10/28/2018 she proceeded to biopsy of the right breast  area in question. The pathology from this procedure showed ([id number]): invasive ductal carcinoma, nottingham grade II of III, 2.8 cm, 1 o'clock, 4.0 cm from the nipple.  Prognostic indicators significant for: estrogen receptor, 90% positive, with strong staining intensity and progesterone receptor, 0% negative. Proliferation marker Ki67 at 20%. HER2 Positive (3+) by immunohistochemistry.   An additional biopsy of the right axilla was performed on the same day ([id number]) showing:  2. Lymph node, needle/core biopsy, inferior right axilla - Tumor cells within fragmented nodal tissue  The patient's subsequent history is as detailed above.   PAST MEDICAL HISTORY: Past Medical History:  Diagnosis Date   Anxiety    Asthma    with severe URIs   Breast cancer ([place])    Bronchitis    Essential hypertension, benign 12/01/2012   Generalized anxiety disorder 12/01/2012   GERD (gastroesophageal reflux disease)    Hypertension    lost over 100lbs, no BP meds now, HCTZ is for edema   Irritable bowel syndrome (IBS)    "had for years," per patient   Personal history of chemotherapy    Personal history of radiation therapy    PONV (postoperative nausea and vomiting)    Seasonal allergies    Sleep apnea    uses CPAP nightly    PAST SURGICAL HISTORY: Past Surgical History:  Procedure Laterality Date   ABDOMINAL HYSTERECTOMY  2011   with vaginal sling   ANKLE SURGERY     BREAST CYST EXCISION Bilateral    BREAST EXCISIONAL BIOPSY Bilateral    BREAST LUMPECTOMY Right 03/2019   BREAST LUMPECTOMY WITH RADIOACTIVE SEED AND AXILLARY LYMPH NODE DISSECTION Right 04/15/2019   Procedure: RIGHT BREAST LUMPECTOMY WITH RADIOACTIVE SEED AND  TARGETED RIGHT AXILLARY LYMPH NODE DISSECTION WITH RADIOACTIVE SEED;  Surgeon: Alphonsa Overall, MD;  Location: [facility];  Service: General;  Laterality: Right;   CHOLECYSTECTOMY  2012   KNEE ARTHROSCOPY W/ MEDIAL COLLATERAL LIGAMENT (MCL) REPAIR Left 10-07-2012   PORTACATH PLACEMENT N/A 11/16/2018   Procedure: INSERTION PORT-A-CATH WITH ULTRASOUND;  Surgeon: Alphonsa Overall, MD;  Location: [facility];  Service: General;   Laterality: N/A;   TONSILLECTOMY  1969    FAMILY HISTORY: Family History  Problem Relation Age of Onset   Hypertension Mother    Rheum arthritis Mother    Heart failure Mother    Hypertension Father    Alcoholism Father    Colon cancer Father    Lung cancer Father    Depression Father    Diabetes Father    Skin cancer Father    Hypertension Brother    Stroke Brother    Atrial fibrillation Brother    Hyperlipidemia Maternal Grandfather    Stroke Paternal Grandmother    Skin cancer Paternal Grandmother    Healthy Daughter    Healthy Daughter    Breast cancer Neg Hx    Ovarian cancer Neg Hx    Prostate cancer Neg Hx    Pancreatic cancer Neg Hx   Santa's father died from lung cancer at age 43. Patients' mother died from congestive heart failure at age 38. The patient has 1 brother. Patient denies anyone in her family having breast, ovarian, prostate, or pancreatic cancer. Wilbert's father was diagnosed with skin cancer, colon cancer, and lung cancer. [name]'s paternal grandmother was diagnosed with skin cancer.    GYNECOLOGIC HISTORY:  No LMP recorded. Patient has had a hysterectomy. 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 Her husband, Stephanne Greeley, is in [employer]	[place]. They have two daughters, Kerri Perches and [place]. [address] Dawna Part is 26, lives in [place], [place], and works in Building surveyor. Adrian Blackwater Hoyleton is 54, lives in [place], [place], and is getting married in 2021   ADVANCED DIRECTIVES: In the absence of any documentation, Darcey's spouse, [name], is her healthcare power of attorney.      HEALTH MAINTENANCE: Social History   Tobacco Use   Smoking status: Never   Smokeless tobacco:  Never  Vaping Use   Vaping Use: Never used  Substance Use Topics   Alcohol use: Yes    Comment: rare   Drug use: No    Colonoscopy: yes, 2015, [facility]   PAP: 2011  Bone density: 07/2019, -1.8 Mammography: 10/21/2018  Allergies  Allergen Reactions   Azithromycin Diarrhea   Erythromycin Itching   Erythromycin Base Itching   Levofloxacin     Tendon pain   Penicillin G Rash, Swelling and Hives   Penicillins Swelling and Rash    Did it involve swelling of the face/tongue/throat, SOB, or low BP? Yes Did it involve sudden or severe rash/hives, skin peeling, or any reaction on the inside of your mouth or nose? No Did you need to seek medical attention at a hospital or doctor's office? Yes When did it last happen?      40+ years ago If all above answers are NO, may proceed with cephalosporin use.     Current Outpatient Medications  Medication Sig Dispense Refill   AMBULATORY NON FORMULARY MEDICATION Continuous positive airway pressure (CPAP) machine set at autopap, with all supplemental supplies as needed. 1 each 0   calcium-vitamin D (OSCAL WITH D) 500-200 MG-UNIT tablet Take 1 tablet by mouth daily with breakfast.     loratadine (CLARITIN) 10 MG tablet Take 10 mg by mouth daily as needed.      losartan (COZAAR) 100 MG tablet Take 1 tablet (100 mg total) by mouth daily. APPT FOR FURTHER REFILLS 30 tablet 0   montelukast (SINGULAIR) 10 MG tablet Take 1 tablet (10 mg total) by mouth at bedtime. 90 tablet 3   Multiple Vitamin (MULTIVITAMIN WITH MINERALS) TABS tablet Take 1 tablet by mouth daily.     potassium chloride (MICRO-K) 10 MEQ CR capsule TAKE 2 CAPSULES DAILY (CHANGE IN DOSAGE OR PILL SIZE) 180 capsule 3   PROAIR HFA 108 (90 Base) MCG/ACT inhaler Inhale 2 puffs into the lungs every 4 (four) hours as needed for wheezing or shortness of breath.     tamoxifen (NOLVADEX) 20 MG tablet TAKE 1 TABLET DAILY 90 tablet 3   valACYclovir (VALTREX) 1000 MG tablet TAKE 1  TABLET TWICE A DAY AS NEEDED FOR COLD SORE 60 tablet 11   venlafaxine XR (EFFEXOR-XR) 75 MG 24 hr capsule Take 1 capsule (75 mg total) by mouth daily with breakfast. **PATIENT NEEDS OFFICE VISIT FOR ADDITIONAL REFILLS** 30 capsule 0   zolpidem (AMBIEN) 5 MG tablet Take 1 tablet (5 mg total) by mouth at bedtime as needed for sleep. 20 tablet 0   No current facility-administered medications for this visit.    OBJECTIVE:  white woman who appears well  Vitals:   08/07/21 1558  BP: 128/72  Pulse: 77  Resp: 16  Temp: 98.1  F (36.7 C)  SpO2: 99%     Body mass index is 25.87 kg/m.   Wt Readings from Last 3 Encounters:  08/07/21 160 lb 4.8 oz (72.7 kg)  08/25/20 163 lb 6.4 oz (74.1 kg)  06/21/20 163 lb 9.6 oz (74.2 kg)  ECOG FS:1 - Symptomatic but completely ambulatory  Sclerae unicteric, EOMs intact Wearing a mask No cervical or supraclavicular adenopathy Lungs no rales or rhonchi Heart regular rate and rhythm Abd soft, nontender, positive bowel sounds; the panniculectomy scar has healed nicely with no dehiscence erythema or swelling. MSK no focal spinal tenderness, no upper extremity lymphedema Neuro: nonfocal, well oriented, appropriate affect Breasts: The right breast is status post lumpectomy and radiation.  Both breasts are status post reduction.  The cosmetic result is excellent.  The scars are barely noticeable.  There is no evidence of disease recurrence.  Both axillae are benign.   LAB RESULTS:  CMP     Component Value Date/Time   NA 144 08/25/2020 0000   K 3.8 08/25/2020 0000   CL 107 08/25/2020 0000   CO2 30 08/25/2020 0000   GLUCOSE 86 08/25/2020 0000   BUN 14 08/25/2020 0000   CREATININE 0.73 08/25/2020 0000   CALCIUM 9.3 08/25/2020 0000   PROT 6.1 08/25/2020 0000   ALBUMIN 4.1 06/21/2020 1453   AST 17 08/25/2020 0000   AST 42 (H) 01/20/2019 0752   ALT 13 08/25/2020 0000   ALT 42 01/20/2019 0752   ALKPHOS 73 06/21/2020 1453   BILITOT 0.6 08/25/2020 0000    BILITOT 0.5 01/20/2019 0752   GFRNONAA 90 08/25/2020 0000   GFRAA 104 08/25/2020 0000    No results found for: TOTALPROTELP, ALBUMINELP, A1GS, A2GS, BETS, BETA2SER, GAMS, MSPIKE, SPEI  No results found for: KPAFRELGTCHN, LAMBDASER, KAPLAMBRATIO  Lab Results  Component Value Date   WBC 7.3 08/07/2021   NEUTROABS 4.6 08/07/2021   HGB 14.9 08/07/2021   HCT 43.3 08/07/2021   MCV 89.5 08/07/2021   PLT 244 08/07/2021    No results found for: LABCA2  No components found for: [id number]  No results for input(s): INR in the last 168 hours.  No results found for: LABCA2  No results found for: WGN562  No results found for: ZHY865  No results found for: HQI696  No results found for: CA2729  No components found for: HGQUANT  No results found for: CEA1 / No results found for: CEA1   No results found for: AFPTUMOR  No results found for: CHROMOGRNA  No results found for: HGBA, HGBA2QUANT, HGBFQUANT, HGBSQUAN (Hemoglobinopathy evaluation)   No results found for: LDH  Lab Results  Component Value Date   IRON 46 05/11/2013   TIBC 254 05/11/2013   IRONPCTSAT 18 (L) 05/11/2013   (Iron and TIBC)  Lab Results  Component Value Date   FERRITIN 72 05/11/2013    Urinalysis    Component Value Date/Time   BILIRUBINUR Small 11/09/2018 0914   PROTEINUR Negative 11/09/2018 0914   UROBILINOGEN 0.2 11/09/2018 0914   NITRITE Negative 11/09/2018 0914   LEUKOCYTESUR Negative 11/09/2018 0914    STUDIES:  No results found.   ELIGIBLE FOR AVAILABLE RESEARCH PROTOCOL: no   ASSESSMENT: 60 y.o. Debbie [name], [place] woman status post right breast upper inner quadrant biopsy 10/28/2018 for a clinical T2 N1, stage IIA invasive ductal carcinoma, grade 2, estrogen receptor positive, progesterone receptor negative, HER-2 amplified, with an MIB-1 of 20%  (a) staging work-up with CT scans of the chest abdomen and pelvis and  thyroid ultrasonography shows multiple findings that will require  follow-up:   (i) two 0.5 cm liver lesions   (ii) possible bladder mass, thickened sigmoid and rectum, and prominent adnexa   (iii) thyroid nodule biopsied 11/19/2018, read as [place] 3 (risk of malignancy 5-15%)   (iiii)  thyroid lymph node biopsied 11/19/2018 showing no evidence of carcinoma   (iiiii) abd MRI 05/07 2020 confirms two tiny liver lesions. No bladder or adnexal malignancy  (1) neoadjuvant chemotherapy consisting of carboplatin, docetaxel, trastuzumab, and pertuzumab every 21 days x 6 started 11/17/2018, copleted 03/02/2019  (a) pertuzumab discontinued after cycle 1 due to severe diarrhea  (2) trastuzumab continued to complete 1 year (last dose 10/19/2019).).  (a) baseline echocardiogram 11/11/2018 showed an ejection fraction in the 55-60% range  (b) echocardiogram 03/10/2019 shows EF of 60-65%  (c) echo 06/08/2019 shows EF 60%  (d) echo 09/14/2018 shows an ejection fraction in the 60-65% range.  (e) echo 12/14/2019 shows an ejection fraction in the 60-65% range  (3) Right breast lumpectomy on 04/15/2019: no residual carcinoma (ypT0 ypN0)  (a) a total of 4 lymph nodes were removed  (4) adjuvant radiation 05/31/2019 - 07/14/2019 Site Technique Total Dose (Gy) Dose per Fx (Gy) Completed Fx Beam Energies  Breast: Breast_Rt 3D 50.4/50.4 1.8 28/28 6X, 10X  Breast: Breast_Rt_axilla 3D 45/45 1.8 25/25 6X, 10X   (5) started tamoxifen 08/20/2019  (a) s/p hysterectomy  (b) bone density 07/30/2019 shows a T score of -1.8   (c) used oral contraceptives for 7 or 8 years in the past with no complications  (6) thyroid nodules followed by Dr Armandina Gemma  (7) very small liver abnormalities noted on CT March 2020 and MRI May 2020 require follow-up   (a) repeat liver MRI 06/06/2020 shows these to be small harmartomas or cysts   PLAN: Arvilla is now a little over 2 years out from definitive surgery for her breast cancer with no evidence of disease recurrence.  This is favorable.  She is  tolerating tamoxifen generally well.  The plan is to continue that a minimum of 5 years.  I do not know why she did not have nighttime hot flashes for a time and then again developed them.  At any rate I think she will benefit from gabapentin at bedtime and I went ahead and wrote her that prescription.  She will let me know if she has any unusual side effects.  Otherwise she will have repeat mammography in March and she will return to see [place] in April.  At that point she can start likely once a year follow-up  Total encounter time 25 minutes.Sarajane Jews C. Mailen Newborn, MD Medical Oncology and Hematology [facility] [place], [place] [address] Tel. [phone]    Fax. [phone]   I, Wilburn Mylar, am acting as scribe for Dr. Virgie Dad. Jaquila Santelli.  I, Lurline Del MD, have reviewed the above documentation for accuracy and completeness, and I agree with the above.   *Total Encounter Time as defined by the Centers for Medicare and Medicaid Services includes, in addition to the face-to-face time of a patient visit (documented in the note above) non-face-to-face time: obtaining and reviewing outside history, ordering and reviewing medications, tests or procedures, care coordination (communications with other health care professionals or caregivers) and documentation in the medical record.

## 2021-08-07 ENCOUNTER — Inpatient Hospital Stay: Payer: BC Managed Care – PPO | Attending: Oncology

## 2021-08-07 ENCOUNTER — Other Ambulatory Visit: Payer: Self-pay

## 2021-08-07 ENCOUNTER — Inpatient Hospital Stay (HOSPITAL_BASED_OUTPATIENT_CLINIC_OR_DEPARTMENT_OTHER): Payer: BC Managed Care – PPO | Admitting: Oncology

## 2021-08-07 VITALS — BP 128/72 | HR 77 | Temp 98.1°F | Resp 16 | Ht 66.0 in | Wt 160.3 lb

## 2021-08-07 DIAGNOSIS — Z923 Personal history of irradiation: Secondary | ICD-10-CM | POA: Insufficient documentation

## 2021-08-07 DIAGNOSIS — Z170 Estrogen receptor positive status [ER+]: Secondary | ICD-10-CM | POA: Insufficient documentation

## 2021-08-07 DIAGNOSIS — F439 Reaction to severe stress, unspecified: Secondary | ICD-10-CM

## 2021-08-07 DIAGNOSIS — Z79810 Long term (current) use of selective estrogen receptor modulators (SERMs): Secondary | ICD-10-CM | POA: Insufficient documentation

## 2021-08-07 DIAGNOSIS — C50211 Malignant neoplasm of upper-inner quadrant of right female breast: Secondary | ICD-10-CM | POA: Insufficient documentation

## 2021-08-07 DIAGNOSIS — Z9221 Personal history of antineoplastic chemotherapy: Secondary | ICD-10-CM | POA: Diagnosis not present

## 2021-08-07 LAB — COMPREHENSIVE METABOLIC PANEL
ALT: 16 U/L (ref 0–44)
AST: 20 U/L (ref 15–41)
Albumin: 4.5 g/dL (ref 3.5–5.0)
Alkaline Phosphatase: 77 U/L (ref 38–126)
Anion gap: 7 (ref 5–15)
BUN: 13 mg/dL (ref 8–23)
CO2: 33 mmol/L — ABNORMAL HIGH (ref 22–32)
Calcium: 9.8 mg/dL (ref 8.9–10.3)
Chloride: 101 mmol/L (ref 98–111)
Creatinine, Ser: 0.8 mg/dL (ref 0.44–1.00)
GFR, Estimated: >60 mL/min (ref >60)
Glucose, Bld: 81 mg/dL (ref 70–99)
Potassium: 3.2 mmol/L — ABNORMAL LOW (ref 3.5–5.1)
Sodium: 141 mmol/L (ref 135–145)
Total Bilirubin: 0.4 mg/dL (ref 0.3–1.2)
Total Protein: 6.9 g/dL (ref 6.5–8.1)

## 2021-08-07 LAB — CBC WITH DIFFERENTIAL/PLATELET
Abs Immature Granulocytes: 0.01 10*3/uL (ref 0.00–0.07)
Basophils Absolute: 0 10*3/uL (ref 0.0–0.1)
Basophils Relative: 0 %
Eosinophils Absolute: 0.3 10*3/uL (ref 0.0–0.5)
Eosinophils Relative: 3 %
HCT: 43.3 % (ref 36.0–46.0)
Hemoglobin: 14.9 g/dL (ref 12.0–15.0)
Immature Granulocytes: 0 %
Lymphocytes Relative: 23 %
Lymphs Abs: 1.7 10*3/uL (ref 0.7–4.0)
MCH: 30.8 pg (ref 26.0–34.0)
MCHC: 34.4 g/dL (ref 30.0–36.0)
MCV: 89.5 fL (ref 80.0–100.0)
Monocytes Absolute: 0.7 10*3/uL (ref 0.1–1.0)
Monocytes Relative: 9 %
Neutro Abs: 4.6 10*3/uL (ref 1.7–7.7)
Neutrophils Relative %: 65 %
Platelets: 244 10*3/uL (ref 150–400)
RBC: 4.84 MIL/uL (ref 3.87–5.11)
RDW: 12.2 % (ref 11.5–15.5)
WBC: 7.3 10*3/uL (ref 4.0–10.5)
nRBC: 0 % (ref 0.0–0.2)

## 2021-08-07 MED ORDER — TAMOXIFEN CITRATE 20 MG PO TABS: 20.0000 mg | ORAL_TABLET | Freq: Every day | ORAL | 4 refills | Status: DC

## 2021-08-07 MED ORDER — VENLAFAXINE HCL ER 75 MG PO CP24: 75.0000 mg | ORAL_CAPSULE | Freq: Every day | ORAL | 4 refills | Status: DC

## 2021-08-07 MED ORDER — GABAPENTIN 300 MG PO CAPS: 300.0000 mg | ORAL_CAPSULE | Freq: Every day | ORAL | 4 refills | Status: DC

## 2021-08-08 ENCOUNTER — Encounter: Payer: Self-pay | Admitting: Physician Assistant

## 2021-08-08 ENCOUNTER — Ambulatory Visit (INDEPENDENT_AMBULATORY_CARE_PROVIDER_SITE_OTHER): Payer: BC Managed Care – PPO | Admitting: Physician Assistant

## 2021-08-08 VITALS — BP 132/75 | HR 83 | Ht 66.0 in | Wt 158.0 lb

## 2021-08-08 DIAGNOSIS — C50211 Malignant neoplasm of upper-inner quadrant of right female breast: Secondary | ICD-10-CM

## 2021-08-08 DIAGNOSIS — Z170 Estrogen receptor positive status [ER+]: Secondary | ICD-10-CM

## 2021-08-08 DIAGNOSIS — U071 COVID-19: Secondary | ICD-10-CM

## 2021-08-08 DIAGNOSIS — I10 Essential (primary) hypertension: Secondary | ICD-10-CM

## 2021-08-08 DIAGNOSIS — Z13220 Encounter for screening for lipoid disorders: Secondary | ICD-10-CM

## 2021-08-08 DIAGNOSIS — E041 Nontoxic single thyroid nodule: Secondary | ICD-10-CM | POA: Diagnosis not present

## 2021-08-08 DIAGNOSIS — Z79899 Other long term (current) drug therapy: Secondary | ICD-10-CM

## 2021-08-08 DIAGNOSIS — F439 Reaction to severe stress, unspecified: Secondary | ICD-10-CM

## 2021-08-08 DIAGNOSIS — G4733 Obstructive sleep apnea (adult) (pediatric): Secondary | ICD-10-CM

## 2021-08-08 DIAGNOSIS — B351 Tinea unguium: Secondary | ICD-10-CM

## 2021-08-08 DIAGNOSIS — R051 Acute cough: Secondary | ICD-10-CM

## 2021-08-08 DIAGNOSIS — B001 Herpesviral vesicular dermatitis: Secondary | ICD-10-CM

## 2021-08-08 DIAGNOSIS — R771 Abnormality of globulin: Secondary | ICD-10-CM

## 2021-08-08 DIAGNOSIS — F5101 Primary insomnia: Secondary | ICD-10-CM

## 2021-08-08 MED ORDER — LOSARTAN POTASSIUM 100 MG PO TABS
100.0000 mg | ORAL_TABLET | Freq: Every day | ORAL | 3 refills | Status: DC
Start: 2021-08-08 — End: 2022-07-15

## 2021-08-08 MED ORDER — CICLOPIROX 8 % EX SOLN: Freq: Every day | CUTANEOUS | 0 refills | Status: DC

## 2021-08-08 MED ORDER — VENLAFAXINE HCL ER 75 MG PO CP24: 75.0000 mg | ORAL_CAPSULE | Freq: Every day | ORAL | 3 refills | Status: DC

## 2021-08-08 MED ORDER — ZOLPIDEM TARTRATE 5 MG PO TABS: 5.0000 mg | ORAL_TABLET | Freq: Every evening | ORAL | 0 refills | Status: DC | PRN

## 2021-08-08 MED ORDER — VALACYCLOVIR HCL 1 G PO TABS: ORAL_TABLET | ORAL | 11 refills | Status: DC

## 2021-08-08 NOTE — Progress Notes (Signed)
Subjective:    Patient ID: Debbie [name], female    DOB: Feb 04, 1961, 60 y.o.   MRN: [id number]  HPI Pt is a 60 yo female with HTN, OSA, breast cancer in remission, anxiety, insomnia who presents to the clinic for medication refills.   Pt is followed by oncology, Dr. Sarajane [name] for [facility]. She is on tamoxifen. She has her ovaries but uterus and cervix have been removed.Pt does question genetic testing and probability to get other cancers. She wonders if this has been done.   She denies any palpitations, CP, SOB, headaches, or vision changes. She is taking her cozaar daily.   Insomnia is controlled with as needed ambien.   She would like to get off CPAP. She has lost 60lbs since she started using the CPAP and thinks she breaths much better at night.   Anxiety is controlled on effexor for the most part.   She does have a toenail that is thick and yellow for 2 years and wants some treatment.   .. Active Ambulatory Problems    Diagnosis Date Noted   Essential hypertension, benign 12/01/2012   Family history of colon cancer 12/01/2012   History of hysterectomy 01/04/2013   History of shingles 01/04/2013   Gastritis 02/17/2013   Class 1 obesity due to excess calories with serious comorbidity and body mass index (BMI) of 30.0 to 30.9 in adult 03/05/2013   Elevated LFTs 05/11/2013   Labral tear of shoulder 08/29/2014   Large breasts 06/26/2016   Primary insomnia 06/26/2016   Mid back pain 06/26/2016   Chronic shoulder pain 06/26/2016   Fungal infection of toenail 01/17/2017   Chest tightness 07/09/2017   SOB (shortness of breath) 07/09/2017   Cough 07/09/2017   Snoring 10/20/2017   Rectocele 10/20/2017   Non-restorative sleep 10/20/2017   No energy 10/20/2017   Reactive airway disease, mild intermittent, uncomplicated 61/95/0932   Periodic limb movement 11/23/2017   OSA (obstructive sleep apnea) 11/23/2017   Nocturnal hypoxemia 11/23/2017   Bilateral hand pain 01/23/2018   Hand  joint stiff, unspecified laterality 01/23/2018   Positive ANA (antinuclear antibody) 02/06/2018   Hypotension 04/29/2018   Family history of stroke 04/29/2018   Primary osteoarthritis of both hands 04/29/2018   Primary osteoarthritis of both feet 04/29/2018   Multiple food allergies 07/20/2018   Malignant neoplasm of upper-inner quadrant of right breast in female, estrogen receptor positive ([place]) 11/02/2018   Liver nodule 11/11/2018   Abnormal CT of the abdomen 11/11/2018   Liver lesion 11/13/2018   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   Sigmoid thickening 11/13/2018   Bladder mass 11/13/2018   Thyroid nodule 11/13/2018   Enlarged lymph nodes 11/13/2018   Port-A-Cath in place 12/29/2018   Stress 08/28/2020   Tachycardia 08/28/2020   Resolved Ambulatory Problems    Diagnosis Date Noted   Generalized anxiety disorder 12/01/2012   Past Medical History:  Diagnosis Date   Anxiety    Asthma    Breast cancer ([place])    Bronchitis    GERD (gastroesophageal reflux disease)    Hypertension    Irritable bowel syndrome (IBS)    Personal history of chemotherapy    Personal history of radiation therapy    PONV (postoperative nausea and vomiting)    Seasonal allergies    Sleep apnea       Review of Systems  All other systems reviewed and are negative.     Objective:   Physical Exam Vitals reviewed.  Constitutional:  Appearance: Normal appearance.  HENT:     Head: Normocephalic.  Neck:     Vascular: No carotid bruit.  Cardiovascular:     Rate and Rhythm: Normal rate and regular rhythm.     Pulses: Normal pulses.  Pulmonary:     Effort: Pulmonary effort is normal.     Breath sounds: Normal breath sounds.  Musculoskeletal:     Right lower leg: No edema.     Left lower leg: No edema.  Neurological:     General: No focal deficit present.     Mental Status: She is alert and oriented to person, place, and time.  Psychiatric:        Mood and  Affect: Mood normal.      .. Depression screen [facility] 2/9 08/08/2021 11/28/2017  Decreased Interest 0 2  Down, Depressed, Hopeless 0 0  PHQ - 2 Score 0 2  Altered sleeping 1 0  Tired, decreased energy 1 2  Change in appetite 1 3  Feeling bad or failure about yourself  0 0  Trouble concentrating 0 0  Moving slowly or fidgety/restless 0 0  Suicidal thoughts 0 0  PHQ-9 Score 3 7  Difficult doing work/chores Not difficult at all Somewhat difficult  Some recent data might be hidden   .Marland Kitchen GAD 7 : Generalized Anxiety Score 08/08/2021 11/28/2017  Nervous, Anxious, on Edge 1 1  Control/stop worrying 0 0  Worry too much - different things 0 0  Trouble relaxing 0 1  Restless 0 0  Easily annoyed or irritable 0 1  Afraid - awful might happen 0 0  Total GAD 7 Score 1 3  Anxiety Difficulty Not difficult at all Somewhat difficult        Assessment & Plan:  Marland KitchenMarland KitchenPeggie was seen today for follow-up.  Diagnoses and all orders for this visit:  Essential hypertension, benign -     losartan (COZAAR) 100 MG tablet; Take 1 tablet (100 mg total) by mouth daily.  Abnormality of globulin  Thyroid nodule  Lipid screening  Medication management  Stress -     venlafaxine XR (EFFEXOR-XR) 75 MG 24 hr capsule; Take 1 capsule (75 mg total) by mouth daily with breakfast.  OSA (obstructive sleep apnea) -     Home sleep test  Malignant neoplasm of upper-inner quadrant of right breast in female, estrogen receptor positive (H[facility])  Toenail fungus -     ciclopirox (PENLAC) 8 % solution; Apply topically at bedtime. Apply over nail and surrounding skin. Apply daily over previous coat. After seven (7) days, may remove with alcohol and continue cycle.  Recurrent cold sores -     valACYclovir (VALTREX) 1000 MG tablet; TAKE 1 TABLET TWICE A DAY AS NEEDED FOR COLD SORE  Primary insomnia -     zolpidem (AMBIEN) 5 MG tablet; Take 1 tablet (5 mg total) by mouth at bedtime as needed for sleep.    BP to  goal. Refilled cozaar.  Labs done yesterday with oncology will review in EMR.  PHQ and GAD no concerns.  Refilled effexor.  Ambien as needed for insomnia. Penlac for toenail fungus. Continue follow up with oncology. Will find out if any genetic testing was done with oncology.  Colonoscopy/mammogram UTD.  Follow up in 6 months.

## 2021-08-08 NOTE — Patient Instructions (Signed)

## 2021-08-09 ENCOUNTER — Encounter: Payer: Self-pay | Admitting: Physician Assistant

## 2021-08-09 NOTE — Addendum Note (Signed)
Addended byAnnamaria Helling on: 08/09/2021 10:36 AM   Modules accepted: Orders

## 2021-08-10 ENCOUNTER — Telehealth (INDEPENDENT_AMBULATORY_CARE_PROVIDER_SITE_OTHER): Payer: BC Managed Care – PPO | Admitting: Physician Assistant

## 2021-08-10 ENCOUNTER — Encounter: Payer: Self-pay | Admitting: Physician Assistant

## 2021-08-10 VITALS — HR 82 | Temp 97.8°F | Ht 66.0 in | Wt 158.0 lb

## 2021-08-10 DIAGNOSIS — R051 Acute cough: Secondary | ICD-10-CM

## 2021-08-10 DIAGNOSIS — U071 COVID-19: Secondary | ICD-10-CM | POA: Diagnosis not present

## 2021-08-10 MED ORDER — BENZONATATE 200 MG PO CAPS: 200.0000 mg | ORAL_CAPSULE | Freq: Three times a day (TID) | ORAL | 0 refills | Status: DC | PRN

## 2021-08-10 MED ORDER — ALBUTEROL SULFATE HFA 108 (90 BASE) MCG/ACT IN AERS: 2.0000 | INHALATION_SPRAY | RESPIRATORY_TRACT | 0 refills | Status: DC | PRN

## 2021-08-10 MED ORDER — PROAIR HFA 108 (90 BASE) MCG/ACT IN AERS: 2.0000 | INHALATION_SPRAY | RESPIRATORY_TRACT | 0 refills | Status: DC | PRN

## 2021-08-10 MED ORDER — NIRMATRELVIR/RITONAVIR (PAXLOVID)TABLET: 3.0000 | ORAL_TABLET | Freq: Two times a day (BID) | ORAL | 0 refills | Status: AC

## 2021-08-10 MED ORDER — HYDROCOD POLST-CPM POLST ER 10-8 MG/5ML PO SUER: 5.0000 mL | Freq: Two times a day (BID) | ORAL | 0 refills | Status: DC | PRN

## 2021-08-10 NOTE — Progress Notes (Signed)
..Virtual Visit via Video Note  I connected with [place] on 08/10/21 at  8:30 AM EST by a video enabled telemedicine application and verified that I am speaking with the correct person using two identifiers.  Location: Patient: home Provider: clinic  .Marland KitchenParticipating in visit:  Patient: Debbie [name] Provider: Iran Planas PA-C   I discussed the limitations of evaluation and management by telemedicine and the availability of in person appointments. The patient expressed understanding and agreed to proceed.  History of Present Illness:    Observations/Objective: No acute distress Flushed cheeks No labored breathing Productive cough  .Marland Kitchen Today's Vitals   08/10/21 0823  Weight: 158 lb (71.7 kg)  Height: 5\' 6"  (1.676 m)   Body mass index is 25.5 kg/m.    Assessment and Plan: Marland KitchenMarland KitchenPeggie was seen today for covid positive.  Diagnoses and all orders for this visit:  COVID-19 virus infection -     PROAIR HFA 108 (90 Base) MCG/ACT inhaler; Inhale 2 puffs into the lungs every 4 (four) hours as needed for wheezing or shortness of breath. -     nirmatrelvir/ritonavir EUA (PAXLOVID) 20 x 150 MG & 10 x 100MG  TABS; Take 3 tablets by mouth 2 (two) times daily for 5 days. (Take nirmatrelvir 150 mg two tablets twice daily for 5 days and ritonavir 100 mg one tablet twice daily for 5 days) Patient GFR is greater than 60. -     benzonatate (TESSALON) 200 MG capsule; Take 1 capsule (200 mg total) by mouth 3 (three) times daily as needed for cough. -     chlorpheniramine-HYDROcodone ([place]) 10-8 MG/5ML SUER; Take 5 mLs by mouth every 12 (twelve) hours as needed.  Acute cough -     PROAIR HFA 108 (90 Base) MCG/ACT inhaler; Inhale 2 puffs into the lungs every 4 (four) hours as needed for wheezing or shortness of breath. -     nirmatrelvir/ritonavir EUA (PAXLOVID) 20 x 150 MG & 10 x 100MG  TABS; Take 3 tablets by mouth 2 (two) times daily for 5 days. (Take nirmatrelvir 150 mg two tablets  twice daily for 5 days and ritonavir 100 mg one tablet twice daily for 5 days) Patient GFR is greater than 60. -     benzonatate (TESSALON) 200 MG capsule; Take 1 capsule (200 mg total) by mouth 3 (three) times daily as needed for cough. -     chlorpheniramine-HYDROcodone ([place]) 10-8 MG/5ML SUER; Take 5 mLs by mouth every 12 (twelve) hours as needed.   Covid positive from home test.  Day 2 odf symptoms 2 covid vaccines done Pt is high risk of complications Started paxlovid. GFR over 60. On tamoxifen which can increase in concentration with paxlovid sent msg to pharmacy to make sure ok for patient.  Discussed symptomatic care with ibuprofen, tylenol, mucinex.  Sent albuterol inhaler for cough and reactive airway disease. Sent tessalon pearls and tussionex for cough. Discussed side effects.  Quarantine for 5 days from symptoms and then wear a mask for full 10 days.  If pulse ox under 90 or sudden swelling or worsening SOB go to UC/ED.  Discussed other red flag signs and symptoms.  Follow up as needed.     Follow Up Instructions:    I discussed the assessment and treatment plan with the patient. The patient was provided an opportunity to ask questions and all were answered. The patient agreed with the plan and demonstrated an understanding of the instructions.   The patient was advised to call back or  seek an in-person evaluation if the symptoms worsen or if the condition fails to improve as anticipated.   Iran Planas, PA-C

## 2021-08-10 NOTE — Progress Notes (Signed)
Referral placed.

## 2021-08-10 NOTE — Addendum Note (Signed)
Addended byAnnamaria Helling on: 08/10/2021 02:45 PM   Modules accepted: Orders

## 2021-08-10 NOTE — Addendum Note (Signed)
Addended byAnnamaria Helling on: 08/10/2021 02:52 PM   Modules accepted: Orders

## 2021-08-13 ENCOUNTER — Encounter: Payer: Self-pay | Admitting: Physician Assistant

## 2021-08-14 MED ORDER — POLYMYXIN B-TRIMETHOPRIM 10000-0.1 UNIT/ML-% OP SOLN: 1.0000 [drp] | OPHTHALMIC | 0 refills | Status: DC

## 2021-08-15 ENCOUNTER — Telehealth: Payer: Self-pay | Admitting: Genetic Counselor

## 2021-08-15 NOTE — Telephone Encounter (Signed)
Scheduled genetic counseling appointment per referral. Patient is aware.

## 2021-08-22 ENCOUNTER — Other Ambulatory Visit (HOSPITAL_COMMUNITY): Payer: Self-pay | Admitting: Internal Medicine

## 2021-08-23 ENCOUNTER — Other Ambulatory Visit: Payer: Self-pay

## 2021-08-23 DIAGNOSIS — U071 COVID-19: Secondary | ICD-10-CM

## 2021-08-23 DIAGNOSIS — R051 Acute cough: Secondary | ICD-10-CM

## 2021-09-05 ENCOUNTER — Inpatient Hospital Stay: Payer: BC Managed Care – PPO | Admitting: Genetic Counselor

## 2021-09-05 ENCOUNTER — Inpatient Hospital Stay: Payer: BC Managed Care – PPO

## 2021-09-07 ENCOUNTER — Other Ambulatory Visit: Payer: Self-pay

## 2021-09-07 DIAGNOSIS — R051 Acute cough: Secondary | ICD-10-CM

## 2021-09-07 DIAGNOSIS — U071 COVID-19: Secondary | ICD-10-CM

## 2021-09-12 ENCOUNTER — Encounter: Payer: Self-pay | Admitting: Physician Assistant

## 2021-09-12 DIAGNOSIS — R051 Acute cough: Secondary | ICD-10-CM

## 2021-09-12 DIAGNOSIS — U071 COVID-19: Secondary | ICD-10-CM

## 2021-09-12 MED ORDER — ALBUTEROL SULFATE HFA 108 (90 BASE) MCG/ACT IN AERS: 2.0000 | INHALATION_SPRAY | RESPIRATORY_TRACT | 0 refills | Status: DC | PRN

## 2021-09-13 ENCOUNTER — Other Ambulatory Visit: Payer: Self-pay

## 2021-09-13 DIAGNOSIS — G4733 Obstructive sleep apnea (adult) (pediatric): Secondary | ICD-10-CM

## 2021-09-14 NOTE — Telephone Encounter (Signed)
Order sent - CF

## 2021-09-17 ENCOUNTER — Other Ambulatory Visit: Payer: Self-pay

## 2021-09-17 ENCOUNTER — Encounter: Payer: Self-pay | Admitting: Licensed Clinical Social Worker

## 2021-09-17 ENCOUNTER — Inpatient Hospital Stay: Payer: BC Managed Care – PPO | Attending: Oncology | Admitting: Licensed Clinical Social Worker

## 2021-09-17 ENCOUNTER — Inpatient Hospital Stay: Payer: BC Managed Care – PPO

## 2021-09-17 DIAGNOSIS — C50211 Malignant neoplasm of upper-inner quadrant of right female breast: Secondary | ICD-10-CM

## 2021-09-17 DIAGNOSIS — Z800 Family history of malignant neoplasm of digestive organs: Secondary | ICD-10-CM | POA: Diagnosis not present

## 2021-09-17 DIAGNOSIS — Z170 Estrogen receptor positive status [ER+]: Secondary | ICD-10-CM | POA: Diagnosis not present

## 2021-09-17 DIAGNOSIS — Z923 Personal history of irradiation: Secondary | ICD-10-CM | POA: Insufficient documentation

## 2021-09-17 DIAGNOSIS — Z9221 Personal history of antineoplastic chemotherapy: Secondary | ICD-10-CM | POA: Diagnosis not present

## 2021-09-17 LAB — CBC WITH DIFFERENTIAL/PLATELET
Abs Immature Granulocytes: 0.01 10*3/uL (ref 0.00–0.07)
Basophils Absolute: 0 10*3/uL (ref 0.0–0.1)
Basophils Relative: 0 %
Eosinophils Absolute: 0.3 10*3/uL (ref 0.0–0.5)
Eosinophils Relative: 6 %
HCT: 39 % (ref 36.0–46.0)
Hemoglobin: 13.6 g/dL (ref 12.0–15.0)
Immature Granulocytes: 0 %
Lymphocytes Relative: 19 %
Lymphs Abs: 1 10*3/uL (ref 0.7–4.0)
MCH: 30.4 pg (ref 26.0–34.0)
MCHC: 34.9 g/dL (ref 30.0–36.0)
MCV: 87.2 fL (ref 80.0–100.0)
Monocytes Absolute: 0.5 10*3/uL (ref 0.1–1.0)
Monocytes Relative: 10 %
Neutro Abs: 3.2 10*3/uL (ref 1.7–7.7)
Neutrophils Relative %: 65 %
Platelets: 212 10*3/uL (ref 150–400)
RBC: 4.47 MIL/uL (ref 3.87–5.11)
RDW: 12.8 % (ref 11.5–15.5)
WBC: 4.9 10*3/uL (ref 4.0–10.5)
nRBC: 0 % (ref 0.0–0.2)

## 2021-09-17 LAB — COMPREHENSIVE METABOLIC PANEL
ALT: 13 U/L (ref 0–44)
AST: 19 U/L (ref 15–41)
Albumin: 4.1 g/dL (ref 3.5–5.0)
Alkaline Phosphatase: 68 U/L (ref 38–126)
Anion gap: 6 (ref 5–15)
BUN: 14 mg/dL (ref 8–23)
CO2: 32 mmol/L (ref 22–32)
Calcium: 9.3 mg/dL (ref 8.9–10.3)
Chloride: 105 mmol/L (ref 98–111)
Creatinine, Ser: 0.74 mg/dL (ref 0.44–1.00)
GFR, Estimated: >60 mL/min (ref >60)
Glucose, Bld: 93 mg/dL (ref 70–99)
Potassium: 3.5 mmol/L (ref 3.5–5.1)
Sodium: 143 mmol/L (ref 135–145)
Total Bilirubin: 0.6 mg/dL (ref 0.3–1.2)
Total Protein: 6.4 g/dL — ABNORMAL LOW (ref 6.5–8.1)

## 2021-09-17 LAB — GENETIC SCREENING ORDER

## 2021-09-17 NOTE — Progress Notes (Signed)
REFERRING PROVIDER: Donella Stade, PA-C [address] [place],  [place] [address]  PRIMARY PROVIDER:  Donella Stade, PA-C  PRIMARY REASON FOR VISIT:  1. Malignant neoplasm of upper-inner quadrant of right breast in female, estrogen receptor positive ([place])   2. Family history of colon cancer      HISTORY OF PRESENT ILLNESS:   Ms. Debbie [name], a 61 y.o. female, was seen for a [place] cancer genetics consultation at the request of Iran Planas PA-C due to a personal and family history of cancer.  Debbie [name] presents to clinic today to discuss the possibility of a hereditary predisposition to cancer, genetic testing, and to further clarify her future cancer risks, as well as potential cancer risks for family members.   In 2020, at the age of 66, Debbie [name] was diagnosed with right breast cancer. This was treated with neoadjuvant chemotherapy, lumpectomy, adjuvant radiation and antiestrogen therapy.  CANCER HISTORY:  Oncology History  Malignant neoplasm of upper-inner quadrant of right breast in female, estrogen receptor positive ([place])  11/02/2018 Initial Diagnosis   Malignant neoplasm of upper-inner quadrant of right breast in female, estrogen receptor positive ([place])   11/17/2018 - 03/02/2019 Chemotherapy   The patient had dexamethasone (DECADRON) 4 MG tablet, 8 mg, , , 1 of 1 cycle, Start date: 02/15/2019, End date: 03/02/2019 palonosetron (ALOXI) injection 0.25 mg, 0.25 mg, Intravenous,  Once, 6 of 6 cycles Administration: 0.25 mg (11/17/2018), 0.25 mg (12/08/2018), 0.25 mg (02/09/2019), 0.25 mg (03/02/2019), 0.25 mg (12/29/2018), 0.25 mg (01/20/2019) pegfilgrastim (NEULASTA ONPRO KIT) injection 6 mg, 6 mg, Subcutaneous, Once, 5 of 5 cycles Administration: 6 mg (12/08/2018), 6 mg (12/29/2018), 6 mg (01/20/2019), 6 mg (02/09/2019), 6 mg (03/02/2019) pegfilgrastim-cbqv (UDENYCA) injection 6 mg, 6 mg, Subcutaneous, Once, 1 of 1 cycle Administration: 6 mg (11/19/2018) trastuzumab  (HERCEPTIN) 672 mg in sodium chloride 0.9 % 250 mL chemo infusion, 8 mg/kg = 672 mg, Intravenous,  Once, 6 of 6 cycles Administration: 672 mg (11/17/2018), 450 mg (12/08/2018), 504 mg (02/09/2019), 504 mg (03/02/2019), 504 mg (12/29/2018), 504 mg (01/20/2019) CARBOplatin (PARAPLATIN) 630 mg in sodium chloride 0.9 % 250 mL chemo infusion, 630 mg (100 % of original dose 634 mg), Intravenous,  Once, 6 of 6 cycles Dose modification:   (original dose 634 mg, Cycle 1),   (original dose 634 mg, Cycle 3),   (original dose 634 mg, Cycle 3),   (original dose 634 mg, Cycle 4),   (original dose 634 mg, Cycle 4) Administration: 630 mg (11/17/2018), 630 mg (12/08/2018), 630 mg (02/09/2019), 630 mg (03/02/2019), 630 mg (12/29/2018), 630 mg (01/20/2019) DOCEtaxel (TAXOTERE) 150 mg in sodium chloride 0.9 % 250 mL chemo infusion, 75 mg/m2 = 150 mg, Intravenous,  Once, 6 of 6 cycles Administration: 150 mg (11/17/2018), 150 mg (12/08/2018), 150 mg (02/09/2019), 150 mg (03/02/2019), 150 mg (12/29/2018), 150 mg (01/20/2019) pertuzumab (PERJETA) 420 mg in sodium chloride 0.9 % 250 mL chemo infusion, 420 mg (100 % of original dose 420 mg), Intravenous, Once, 1 of 1 cycle Dose modification: 420 mg (original dose 420 mg, Cycle 1, Reason: Provider Judgment) Administration: 420 mg (11/17/2018) fosaprepitant (EMEND) 150 mg, dexamethasone (DECADRON) 12 mg in sodium chloride 0.9 % 145 mL IVPB, , Intravenous,  Once, 6 of 6 cycles Administration:  (11/17/2018),  (12/08/2018),  (02/09/2019),  (03/02/2019),  (12/29/2018),  (01/20/2019)   for chemotherapy treatment.     03/23/2019 -  Chemotherapy   The patient had trastuzumab (HERCEPTIN) 525 mg in sodium chloride 0.9 %  250 mL chemo infusion, 6 mg/kg = 525 mg (100 % of original dose 6 mg/kg), Intravenous,  Once, 1 of 1 cycle Dose modification: 6 mg/kg (original dose 6 mg/kg, Cycle 1, Reason: Provider Judgment) Administration: 525 mg (03/23/2019) trastuzumab-dkst (OGIVRI) 504 mg in sodium chloride 0.9 % 250 mL chemo  infusion, 6 mg/kg = 504 mg (100 % of original dose 6 mg/kg), Intravenous,  Once, 9 of 9 cycles Dose modification: 6 mg/kg (original dose 6 mg/kg, Cycle 2, Reason: Other (see comments), Comment: Biosimilar Conversion) Administration: 504 mg (05/04/2019), 504 mg (05/25/2019), 504 mg (07/06/2019), 504 mg (06/15/2019), 504 mg (07/27/2019), 504 mg (08/17/2019), 504 mg (09/07/2019), 504 mg (09/28/2019), 504 mg (10/19/2019)   for chemotherapy treatment.        RISK FACTORS:  Menarche was at age 30.  First live birth at age 61.  OCP use for approximately 8 years.  Ovaries intact: yes.  Hysterectomy: yes.  Menopausal status: postmenopausal.  HRT use: 0 years. Colonoscopy: yes; normal. Mammogram within the last year: yes. Number of breast biopsies: has had 4-5 and 3 lumpectomies Up to date with pelvic exams: yes.  Past Medical History:  Diagnosis Date   Anxiety    Asthma    with severe URIs   Breast cancer ([place])    Bronchitis    Essential hypertension, benign 12/01/2012   Family history of colon cancer    Generalized anxiety disorder 12/01/2012   GERD (gastroesophageal reflux disease)    Hypertension    lost over 100lbs, no BP meds now, HCTZ is for edema   Irritable bowel syndrome (IBS)    "had for years," per patient   Personal history of chemotherapy    Personal history of radiation therapy    PONV (postoperative nausea and vomiting)    Seasonal allergies    Sleep apnea    uses CPAP nightly    Past Surgical History:  Procedure Laterality Date   ABDOMINAL HYSTERECTOMY  2011   with vaginal sling   ANKLE SURGERY     BREAST CYST EXCISION Bilateral    BREAST EXCISIONAL BIOPSY Bilateral    BREAST LUMPECTOMY Right 03/2019   BREAST LUMPECTOMY WITH RADIOACTIVE SEED AND AXILLARY LYMPH NODE DISSECTION Right 04/15/2019   Procedure: RIGHT BREAST LUMPECTOMY WITH RADIOACTIVE SEED AND  TARGETED RIGHT AXILLARY LYMPH NODE DISSECTION WITH RADIOACTIVE SEED;  Surgeon: Alphonsa Overall, MD;  Location:  [facility];  Service: General;  Laterality: Right;   CHOLECYSTECTOMY  2012   KNEE ARTHROSCOPY W/ MEDIAL COLLATERAL LIGAMENT (MCL) REPAIR Left 10-07-2012   PORTACATH PLACEMENT N/A 11/16/2018   Procedure: INSERTION PORT-A-CATH WITH ULTRASOUND;  Surgeon: Alphonsa Overall, MD;  Location: [facility];  Service: General;  Laterality: N/A;   TONSILLECTOMY  1969    Social History   Socioeconomic History   Marital status: Married    Spouse name: [name]   Number of children: 2   Years of education: Not on file   Highest education level: Not on file  Occupational History   Not on file  Tobacco Use   Smoking status: Never   Smokeless tobacco: Never  Vaping Use   Vaping Use: Never used  Substance and Sexual Activity   Alcohol use: Yes    Comment: rare   Drug use: No   Sexual activity: Not Currently  Other Topics Concern   Not on file  Social History Narrative   Not on file   Social Determinants of Health   Financial Resource Strain: Not on file  Food Insecurity: Not on file  Transportation Needs: Not on file  Physical Activity: Not on file  Stress: Not on file  Social Connections: Not on file     FAMILY HISTORY:  We obtained a detailed, 4-generation family history.  Significant diagnoses are listed below: Family History  Problem Relation Age of Onset   Hypertension Mother    Rheum arthritis Mother    Heart failure Mother    Hypertension Father    Alcoholism Father    Colon cancer Father 59   Lung cancer Father    Depression Father    Diabetes Father    Skin cancer Father    Hypertension Brother    Stroke Brother    Atrial fibrillation Brother    Hyperlipidemia Maternal Grandfather    Stroke Paternal Grandmother    Skin cancer Paternal 43    Healthy Daughter    Healthy Daughter    Breast cancer Neg Hx    Ovarian cancer Neg Hx    Prostate cancer Neg Hx    Pancreatic cancer Neg Hx    Debbie [name] has 2 daughters (51 and 49) and 1 brother (36). None  have had cancer.  Debbie [name] mother died at 98. Patient has 1 maternal aunt and 3 maternal cousins, no cancers. Maternal grandmother died at 64, grandfather died at 45.  Debbie [name] father died at 5. He had history of colon cancer at 68, skin cancer and then metastatic lung cancer which is what he passed from. Patient had 1 paternal aunt who died at 53 and 2 paternal cousins who have not had cancer. Paternal grandmother had skin cancer and sun exposure, she died at 75. Grandfather died at 13.   Debbie [name] is unaware of previous family history of genetic testing for hereditary cancer risks. Patient's maternal ancestors are of [place] descent, and paternal ancestors are of Native American/Greek descent. There is no reported Ashkenazi Jewish ancestry. There is no known consanguinity.   GENETIC COUNSELING ASSESSMENT: Debbie [name] is a 61 y.o. female with a personal and family history which is somewhat suggestive of a hereditary cancer syndrome and predisposition to cancer. We, therefore, discussed and recommended the following at today's visit.   DISCUSSION: We discussed that approximately 10% of breast cancer is hereditary. Most cases of hereditary breast cancer are associated with BRCA1/BRCA2 genes, although there are other genes associated with hereditary breast/colon cancer as well including CHEK2. Cancers and risks are gene specific.  We discussed that testing is beneficial for several reasons including knowing about other cancer risks, identifying potential screening and risk-reduction options that may be appropriate, and to understand if other family members could be at risk for cancer and allow them to undergo genetic testing.   We reviewed the characteristics, features and inheritance patterns of hereditary cancer syndromes. We also discussed genetic testing, including the appropriate family members to test, the process of testing, insurance coverage and turn-around-time for results.  We discussed the implications of a negative, positive and/or variant of uncertain significant result. We recommended Debbie [name] pursue genetic testing for the [facility] CancerNext-Expanded+RNA gene panel.   The CancerNext-Expanded + RNAinsight gene panel offered by [facility] and includes sequencing and rearrangement analysis for the following 77 genes: IP, ALK, APC*, ATM*, AXIN2, BAP1, BARD1, BLM, BMPR1A, BRCA1*, BRCA2*, BRIP1*, CDC73, CDH1*,CDK4, CDKN1B, CDKN2A, CHEK2*, CTNNA1, DICER1, FANCC, FH, FLCN, GALNT12, KIF1B, LZTR1, MAX, MEN1, MET, MLH1*, MSH2*, MSH3, MSH6*, MUTYH*, NBN, NF1*, NF2, NTHL1, PALB2*, PHOX2B, PMS2*, POT1, PRKAR1A, PTCH1, PTEN*, RAD51C*, RAD51D*,RB1,  RECQL, RET, SDHA, SDHAF2, SDHB, SDHC, SDHD, SMAD4, SMARCA4, SMARCB1, SMARCE1, STK11, SUFU, TMEM127, TP53*,TSC1, TSC2, VHL and XRCC2 (sequencing and deletion/duplication); EGFR, EGLN1, HOXB13, KIT, MITF, PDGFRA, POLD1 and POLE (sequencing only); EPCAM and GREM1 (deletion/duplication only).  Based on Debbie [name]'s personal and family history of cancer, she meets medical criteria for genetic testing. Despite that she meets criteria, she may still have an out of pocket cost. We discussed that if her out of pocket cost for testing is over $100, the laboratory will call and confirm whether she wants to proceed with testing.  If the out of pocket cost of testing is less than $100 she will be billed by the genetic testing laboratory.   PLAN: After considering the risks, benefits, and limitations, Debbie [name] provided informed consent to pursue genetic testing and the blood sample was sent to [facility] for analysis of the CancerNext-Expanded+RNA panel. Results should be available within approximately 2-3 weeks' time, at which point they will be disclosed by telephone to Debbie [name], as will any additional recommendations warranted by these results. Debbie [name] will receive a summary of her genetic counseling visit and a copy of her  results once available. This information will also be available in Epic.   Debbie [name] questions were answered to her satisfaction today. Our contact information was provided should additional questions or concerns arise. Thank you for the referral and allowing [place] to share in the care of your patient.   Faith Rogue, MS, [facility] Genetic Counselor [email or web address] Phone: [phone]  The patient was seen for a total of 25 minutes in face-to-face genetic counseling.  Patient was seen alone. Dr. Grayland Ormond was available for discussion regarding this case.   _______________________________________________________________________ For Office Staff:  Number of people involved in session: 1 Was an Intern/ student involved with case: no

## 2021-10-04 ENCOUNTER — Telehealth: Payer: Self-pay | Admitting: Licensed Clinical Social Worker

## 2021-10-04 NOTE — Telephone Encounter (Signed)
Revealed negative genetic testing.  This normal result is reassuring and indicates that it is unlikely Debbie [name]'s cancer is due to a hereditary cause.  It is unlikely that there is an increased risk of another cancer due to a mutation in one of these genes.  However, genetic testing is not perfect, and cannot definitively rule out a hereditary cause.  It will be important for her to keep in contact with genetics to learn if any additional testing may be needed in the future.

## 2021-10-11 ENCOUNTER — Other Ambulatory Visit: Payer: Self-pay | Admitting: Hematology and Oncology

## 2021-10-11 DIAGNOSIS — Z9889 Other specified postprocedural states: Secondary | ICD-10-CM

## 2021-10-31 ENCOUNTER — Ambulatory Visit (HOSPITAL_BASED_OUTPATIENT_CLINIC_OR_DEPARTMENT_OTHER): Payer: BC Managed Care – PPO | Attending: Physician Assistant | Admitting: Internal Medicine

## 2021-10-31 VITALS — Ht 66.0 in | Wt 160.0 lb

## 2021-10-31 DIAGNOSIS — G4733 Obstructive sleep apnea (adult) (pediatric): Secondary | ICD-10-CM | POA: Diagnosis not present

## 2021-11-07 ENCOUNTER — Encounter: Payer: Self-pay | Admitting: Physician Assistant

## 2021-11-09 DIAGNOSIS — Z713 Dietary counseling and surveillance: Secondary | ICD-10-CM | POA: Diagnosis not present

## 2021-11-10 DIAGNOSIS — G4733 Obstructive sleep apnea (adult) (pediatric): Secondary | ICD-10-CM | POA: Diagnosis not present

## 2021-11-10 NOTE — Procedures (Signed)
? ? ?  Patient Name: Debbie [name], Debbie [name] ?Study Date: 10/31/2021 ?Gender: Female ?D.O.B: 09/15/1960 ?Age (years): 30 ?Referring Provider: Iran Planas ?Height (inches): 66 ?Interpreting Physician: Baird Lyons MD, ABSM ?Weight (lbs): 160 ?RPSGT: Jacolyn Reedy ?BMI: 26 ?MRN: [id number] ?Neck Size: 15.00 ? ?CLINICAL INFORMATION ?Sleep Study Type: HST ?Indication for sleep study: Snoring ?Epworth Sleepiness Score: 2 ? ?Most recent polysomnogram dated 11/14/2017 revealed an AHI of 12.0/h and RDI of 14.8/h. ? ?SLEEP STUDY TECHNIQUE ?A multi-channel overnight portable sleep study was performed. The channels recorded were: nasal airflow, thoracic respiratory movement, and oxygen saturation with a pulse oximetry. Snoring was also monitored. ? ?MEDICATIONS ?Patient self administered medications include: none reported. ? ?SLEEP ARCHITECTURE ?Patient was studied for 443.5 minutes. The sleep efficiency was 100.0 % and the patient was supine for 35.1%. The arousal index was 0.0 per hour. ? ?RESPIRATORY PARAMETERS ?The overall AHI was 10.7 per hour, with a central apnea index of 0.1 per hour. ?The oxygen nadir was 89% during sleep. ? ?CARDIAC DATA ?Mean heart rate during sleep was 70.0 bpm. ? ?IMPRESSIONS ?- Mild obstructive sleep apnea occurred during this study (AHI = 10.7/h). ?- No significant central sleep apnea occurred during this study (CAI = 0.1/h). ?- Mild oxygen desaturation was noted during this study (Min O2 = 89%). Mean 94% ?- Patient snored. ? ?DIAGNOSIS ?- Obstructive Sleep Apnea (G47.33) ? ?RECOMMENDATIONS ?- Treatment for mild OSA is directed by symptoms and co-morbidity. Conservative options may include obserrvation, weight loss and sleep position off back. Other measures, including CPAP, a fitted oral appliance, or ENT evaluation would be based on clinical judgment. ?- Be careful with alcohol, sedatives and other CNS depressants that may worsen sleep apnea and disrupt normal sleep architecture. ?- Sleep hygiene  should be reviewed to assess factors that may improve sleep quality. ?- Weight management and regular exercise should be initiated or continued. ? ?[Electronically signed] 11/10/2021 10:07 AM ? ?Baird Lyons MD, ABSM ?Diplomate, [place] Board of Sleep Medicine ? ? ?NPI: [id number] ? ?  ? ? ? ? ? ? ? ? ? ? ? ? ? ? ? ? ? ? ? ? ? ?Roberta Angell ?Diplomate, Tax adviser of Sleep Medicine ? ?ELECTRONICALLY SIGNED ON:  11/10/2021, 10:09 AM ?[place] ?PH: [phone]   FX: [phone] ?ACCREDITED BY THE AMERICAN ACADEMY OF SLEEP MEDICINE ?

## 2021-11-13 NOTE — Telephone Encounter (Signed)
Per Miroslav Gin: Mild Sleep Apnea, qualifies for CPAP, can order autopap if patient agreeable. Waiting response.  ?

## 2021-11-16 ENCOUNTER — Ambulatory Visit
Admission: RE | Admit: 2021-11-16 | Discharge: 2021-11-16 | Disposition: A | Discharge status: Home / Self Care | Payer: BC Managed Care – PPO | Source: Ambulatory Visit | Attending: Hematology and Oncology | Admitting: Hematology and Oncology

## 2021-11-16 ENCOUNTER — Encounter: Payer: Self-pay | Admitting: Oncology

## 2021-11-16 DIAGNOSIS — R922 Inconclusive mammogram: Secondary | ICD-10-CM | POA: Diagnosis not present

## 2021-11-16 DIAGNOSIS — Z9889 Other specified postprocedural states: Secondary | ICD-10-CM

## 2021-11-16 DIAGNOSIS — Z853 Personal history of malignant neoplasm of breast: Secondary | ICD-10-CM | POA: Diagnosis not present

## 2021-11-28 ENCOUNTER — Inpatient Hospital Stay: Payer: BC Managed Care – PPO | Attending: Oncology

## 2021-11-28 ENCOUNTER — Inpatient Hospital Stay (HOSPITAL_BASED_OUTPATIENT_CLINIC_OR_DEPARTMENT_OTHER): Payer: BC Managed Care – PPO | Admitting: Hematology and Oncology

## 2021-11-28 ENCOUNTER — Other Ambulatory Visit: Payer: Self-pay

## 2021-11-28 ENCOUNTER — Encounter: Payer: Self-pay | Admitting: Hematology and Oncology

## 2021-11-28 VITALS — BP 139/80 | HR 66 | Temp 98.1°F | Resp 16 | Ht 66.0 in | Wt 170.8 lb

## 2021-11-28 DIAGNOSIS — Z923 Personal history of irradiation: Secondary | ICD-10-CM | POA: Insufficient documentation

## 2021-11-28 DIAGNOSIS — Z9221 Personal history of antineoplastic chemotherapy: Secondary | ICD-10-CM | POA: Insufficient documentation

## 2021-11-28 DIAGNOSIS — K580 Irritable bowel syndrome with diarrhea: Secondary | ICD-10-CM | POA: Diagnosis not present

## 2021-11-28 DIAGNOSIS — Z170 Estrogen receptor positive status [ER+]: Secondary | ICD-10-CM | POA: Insufficient documentation

## 2021-11-28 DIAGNOSIS — C50211 Malignant neoplasm of upper-inner quadrant of right female breast: Secondary | ICD-10-CM | POA: Insufficient documentation

## 2021-11-28 DIAGNOSIS — Z79899 Other long term (current) drug therapy: Secondary | ICD-10-CM | POA: Diagnosis not present

## 2021-11-28 LAB — CBC WITH DIFFERENTIAL (CANCER CENTER ONLY)
Abs Immature Granulocytes: 0 10*3/uL (ref 0.00–0.07)
Basophils Absolute: 0 10*3/uL (ref 0.0–0.1)
Basophils Relative: 0 %
Eosinophils Absolute: 0.3 10*3/uL (ref 0.0–0.5)
Eosinophils Relative: 5 %
HCT: 39.7 % (ref 36.0–46.0)
Hemoglobin: 13.6 g/dL (ref 12.0–15.0)
Immature Granulocytes: 0 %
Lymphocytes Relative: 23 %
Lymphs Abs: 1.2 10*3/uL (ref 0.7–4.0)
MCH: 30.4 pg (ref 26.0–34.0)
MCHC: 34.3 g/dL (ref 30.0–36.0)
MCV: 88.8 fL (ref 80.0–100.0)
Monocytes Absolute: 0.6 10*3/uL (ref 0.1–1.0)
Monocytes Relative: 11 %
Neutro Abs: 3.2 10*3/uL (ref 1.7–7.7)
Neutrophils Relative %: 61 %
Platelet Count: 210 10*3/uL (ref 150–400)
RBC: 4.47 MIL/uL (ref 3.87–5.11)
RDW: 12.3 % (ref 11.5–15.5)
WBC Count: 5.3 10*3/uL (ref 4.0–10.5)
nRBC: 0 % (ref 0.0–0.2)

## 2021-11-28 LAB — CMP (CANCER CENTER ONLY)
ALT: 16 U/L (ref 0–44)
AST: 23 U/L (ref 15–41)
Albumin: 4 g/dL (ref 3.5–5.0)
Alkaline Phosphatase: 53 U/L (ref 38–126)
Anion gap: 6 (ref 5–15)
BUN: 16 mg/dL (ref 8–23)
CO2: 31 mmol/L (ref 22–32)
Calcium: 9.3 mg/dL (ref 8.9–10.3)
Chloride: 106 mmol/L (ref 98–111)
Creatinine: 0.56 mg/dL (ref 0.44–1.00)
GFR, Estimated: >60 mL/min (ref >60)
Glucose, Bld: 83 mg/dL (ref 70–99)
Potassium: 3.8 mmol/L (ref 3.5–5.1)
Sodium: 143 mmol/L (ref 135–145)
Total Bilirubin: 0.5 mg/dL (ref 0.3–1.2)
Total Protein: 6.8 g/dL (ref 6.5–8.1)

## 2021-11-28 NOTE — Progress Notes (Signed)
?[place]  ?[phone] [phone]  ? ? ?ID: Debbie [name] DOB: Jun 23, 1961  MR#: [id number]  QIH#:[id number] ? ?Patient Care Team: ?Lavada Mesi as PCP - General (Family Medicine) ?Alphonsa Overall, MD as Consulting Physician (General Surgery) ?Gery Pray, MD as Consulting Physician (Radiation Oncology) ?Burden, Lincoln Brigham, MD as Referring Physician (Ophthalmology) ?Larey Dresser, MD as Consulting Physician (Cardiology) ?Mauro Kaufmann, RN as Oncology Nurse Navigator ?Rockwell Germany, RN as Oncology Nurse Navigator ?Benay Pike, MD as Consulting Physician (Hematology and Oncology) ?OTHER MD: Earl Lagos, MD (Ophthalmology) ? ? ?CHIEF COMPLAINT: Estrogen receptor positive breast cancer ? ?CURRENT TREATMENT: tamoxifen ? ? ?INTERVAL HISTORY: ?Debbie [name] returns today for follow-up of her HER-2 positive breast cancer.  ?Debbie [name] is tolerating tamoxifen very well.-She denies any major complaints. ?She has not noticed any changes in her breast, she tells me that her breasts are generally very lumpy. ?She had a mammogram recently which did not show any evidence of malignancy. ?No other health concerns today except for some fatigue. ?Rest of the pertinent 10 point ROS reviewed and negative ? ?REVIEW OF SYSTEMS: A detailed review of systems today was otherwise stable. ? ? COVID 19 VACCINATION STATUS: full vaccinated [employer]) x2, no booster as of December 2022 ? ?HISTORY OF CURRENT ILLNESS: ?From the original intake note: ? ?[place] had routine screening mammography on 10/21/2018 showing a possible abnormality in the right breast. She felt a lump in her breast, possibly dating back to 01/2018, but she didn't think anything about it due to her history with fibrous breasts; she has had fibrous breasts all her life and began to have mammograms at the age of 13.  ? ?She underwent right breast ultrasonography at [facility] on 10/28/2018 showing:  On physical exam, a firm, fixed mass is palpated in the superior right breast. Targeted ultrasound is performed, showing an irregular hypoechoic mass at the 1 o'clock position 4 cm from the nipple. It measures 2.8 x 2.4 x 1.8 cm. There is associated peripheral and internal vascularity. An oval, circumscribed hypoechoic mass is also identified within the vicinity at the 1 o'clock position 4 cm from the nipple. It measures 1.0 x 0.9 x 0.4 cm. This corresponds with an additional mammographically identified mass in the upper inner quadrant at middle depth. This is mammographically stable dating back to at least 2014. Additional stable, circumscribed masses are scattered throughout the bilateral breasts mammographically. Evaluation of the right axilla demonstrates a single, markedly abnormal lymph node with cortical thickening up to 1 cm. No additional suspicious lymphadenopathy is identified. ? ?Accordingly on 10/28/2018 she proceeded to biopsy of the right breast area in question. The pathology from this procedure showed ([id number]): invasive ductal carcinoma, nottingham grade II of III, 2.8 cm, 1 o'clock, 4.0 cm from the nipple. Prognostic indicators significant for: estrogen receptor, 90% positive, with strong staining intensity and progesterone receptor, 0% negative. Proliferation marker Ki67 at 20%. HER2 Positive (3+) by immunohistochemistry.  ? ?An additional biopsy of the right axilla was performed on the same day ([id number]) showing:  ?2. Lymph node, needle/core biopsy, inferior right axilla ?- Tumor cells within fragmented nodal tissue ? ?The patient's subsequent history is as detailed above. ? ? ?PAST MEDICAL HISTORY: ?Past Medical History:  ?Diagnosis Date  ? Anxiety   ? Asthma   ? with severe URIs  ? Breast cancer ([place])   ? Bronchitis   ? Essential hypertension, benign 12/01/2012  ? Family history of  colon cancer   ? Generalized anxiety disorder 12/01/2012  ? GERD (gastroesophageal reflux disease)   ?  Hypertension   ? lost over 100lbs, no BP meds now, HCTZ is for edema  ? Irritable bowel syndrome (IBS)   ? "had for years," per patient  ? Personal history of chemotherapy   ? Personal history of radiation therapy   ? PONV (postoperative nausea and vomiting)   ? Seasonal allergies   ? Sleep apnea   ? uses CPAP nightly  ? ? ?PAST SURGICAL HISTORY: ?Past Surgical History:  ?Procedure Laterality Date  ? ABDOMINAL HYSTERECTOMY  2011  ? with vaginal sling  ? ANKLE SURGERY    ? BREAST CYST EXCISION Bilateral   ? BREAST EXCISIONAL BIOPSY Bilateral   ? BREAST LUMPECTOMY Right 03/2019  ? BREAST LUMPECTOMY Left   ? benign. done around age 31  ? BREAST LUMPECTOMY WITH RADIOACTIVE SEED AND AXILLARY LYMPH NODE DISSECTION Right 04/15/2019  ? Procedure: RIGHT BREAST LUMPECTOMY WITH RADIOACTIVE SEED AND  TARGETED RIGHT AXILLARY LYMPH NODE DISSECTION WITH RADIOACTIVE SEED;  Surgeon: Alphonsa Overall, MD;  Location: [place];  Service: General;  Laterality: Right;  ? CHOLECYSTECTOMY  2012  ? KNEE ARTHROSCOPY W/ MEDIAL COLLATERAL LIGAMENT (MCL) REPAIR Left 10/07/2012  ? PORTACATH PLACEMENT N/A 11/16/2018  ? Procedure: INSERTION PORT-A-CATH WITH ULTRASOUND;  Surgeon: Alphonsa Overall, MD;  Location: [facility];  Service: General;  Laterality: N/A;  ? REDUCTION MAMMAPLASTY Bilateral 11/17/2020  ? TONSILLECTOMY  1969  ? ? ?FAMILY HISTORY: ?Family History  ?Problem Relation Age of Onset  ? Hypertension Mother   ? Rheum arthritis Mother   ? Heart failure Mother   ? Hypertension Father   ? Alcoholism Father   ? Colon cancer Father 26  ? Lung cancer Father   ? Depression Father   ? Diabetes Father   ? Skin cancer Father   ? Hypertension Brother   ? Stroke Brother   ? Atrial fibrillation Brother   ? Hyperlipidemia Maternal Grandfather   ? Stroke Paternal Grandmother   ? Skin cancer Paternal Grandmother   ? Healthy Daughter   ? Healthy Daughter   ? Breast cancer Neg Hx   ? Ovarian cancer Neg Hx   ? Prostate cancer Neg Hx   ? Pancreatic  cancer Neg Hx   ?Heath's father died from lung cancer at age 41. Patients' mother died from congestive heart failure at age 11. The patient has 1 brother. Patient denies anyone in her family having breast, ovarian, prostate, or pancreatic cancer. Jasmeen's father was diagnosed with skin cancer, colon cancer, and lung cancer. [name]'s paternal grandmother was diagnosed with skin cancer.  ? ? ?GYNECOLOGIC HISTORY:  ?No LMP recorded. Patient has had a hysterectomy. ?Menarche: 61 years old ?Age at first live birth: 61 years old ?GX P: 2 ?LMP: 03/01/2010, s/p Hysterectomy ?Contraceptive: Used oral contraceptives for 7 or 8 years with no complications ?HRT: no  ?Hysterectomy?: yes ?BSO?: no ? ? 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 Her husband, Cheney Gosch, is in [employer]	[place]. They have two daughters, Kerri Perches and [place]. [address] Dawna Part is 65, lives in [place], [place], and works in Building surveyor. Adrian [place] is 56, lives in [place], [place], and is getting married in 2021 ? ? ADVANCED DIRECTIVES: In the absence of any documentation, Aalijah's spouse, [name], is her healthcare power of attorney.    ? ? ?HEALTH MAINTENANCE: ?Social History  ? ?Tobacco Use  ?  Smoking status: Never  ? Smokeless tobacco: Never  ?Vaping Use  ? Vaping Use: Never used  ?Substance Use Topics  ? Alcohol use: Yes  ?  Comment: rare  ? Drug use: No  ? ? Colonoscopy: yes, 2015, [facility]  ? PAP: 2011 ? Bone density: 07/2019, -1.8 ?Mammography: 10/21/2018 ? ?Allergies  ?Allergen Reactions  ? Erythromycin Itching and Rash  ? Penicillins Swelling, Rash and Hives  ?  Did it involve swelling of the face/tongue/throat, SOB, or low BP? Yes ?Did it involve sudden or severe rash/hives, skin peeling, or any reaction on the inside of your mouth or nose? No ?Did you need to seek medical attention at a hospital or doctor's office? Yes ?When did it last happen?      40+ years  ago ?If all above answers are ?NO?, may proceed with cephalosporin use. ? ?Did it involve swelling of the face/tongue/throat, SOB, or low BP? Yes ?Did it involve sudden or severe rash/hives, skin peeling, or a

## 2021-12-13 DIAGNOSIS — Z713 Dietary counseling and surveillance: Secondary | ICD-10-CM | POA: Diagnosis not present

## 2021-12-24 DIAGNOSIS — L905 Scar conditions and fibrosis of skin: Secondary | ICD-10-CM | POA: Diagnosis not present

## 2022-01-04 DIAGNOSIS — Z713 Dietary counseling and surveillance: Secondary | ICD-10-CM | POA: Diagnosis not present

## 2022-01-22 ENCOUNTER — Other Ambulatory Visit: Payer: Self-pay | Admitting: Oncology

## 2022-01-25 DIAGNOSIS — L905 Scar conditions and fibrosis of skin: Secondary | ICD-10-CM | POA: Diagnosis not present

## 2022-01-25 DIAGNOSIS — E65 Localized adiposity: Secondary | ICD-10-CM | POA: Diagnosis not present

## 2022-03-20 ENCOUNTER — Encounter: Payer: Self-pay | Admitting: Neurology

## 2022-05-07 ENCOUNTER — Telehealth: Payer: Self-pay | Admitting: Physician Assistant

## 2022-05-07 NOTE — Telephone Encounter (Signed)
Patient sent a mychart message states Heart Rate is racing heart -- comes on sporadically - last time was up to 191 BPM she is scheduled for a physical on 05-13-22.

## 2022-05-08 ENCOUNTER — Ambulatory Visit: Payer: BC Managed Care – PPO | Admitting: Physician Assistant

## 2022-05-13 ENCOUNTER — Ambulatory Visit (INDEPENDENT_AMBULATORY_CARE_PROVIDER_SITE_OTHER): Payer: BC Managed Care – PPO | Admitting: Physician Assistant

## 2022-05-13 ENCOUNTER — Encounter: Payer: Self-pay | Admitting: Physician Assistant

## 2022-05-13 VITALS — BP 138/75 | HR 67 | Ht 66.0 in | Wt 179.0 lb

## 2022-05-13 DIAGNOSIS — I10 Essential (primary) hypertension: Secondary | ICD-10-CM | POA: Diagnosis not present

## 2022-05-13 DIAGNOSIS — N951 Menopausal and female climacteric states: Secondary | ICD-10-CM

## 2022-05-13 DIAGNOSIS — Z13220 Encounter for screening for lipoid disorders: Secondary | ICD-10-CM

## 2022-05-13 DIAGNOSIS — E041 Nontoxic single thyroid nodule: Secondary | ICD-10-CM

## 2022-05-13 DIAGNOSIS — Z23 Encounter for immunization: Secondary | ICD-10-CM | POA: Diagnosis not present

## 2022-05-13 DIAGNOSIS — Z79899 Other long term (current) drug therapy: Secondary | ICD-10-CM

## 2022-05-13 DIAGNOSIS — I4510 Unspecified right bundle-branch block: Secondary | ICD-10-CM

## 2022-05-13 DIAGNOSIS — Z0000 Encounter for general adult medical examination without abnormal findings: Secondary | ICD-10-CM

## 2022-05-13 DIAGNOSIS — E663 Overweight: Secondary | ICD-10-CM

## 2022-05-13 DIAGNOSIS — R000 Tachycardia, unspecified: Secondary | ICD-10-CM

## 2022-05-13 DIAGNOSIS — G4733 Obstructive sleep apnea (adult) (pediatric): Secondary | ICD-10-CM

## 2022-05-13 DIAGNOSIS — R635 Abnormal weight gain: Secondary | ICD-10-CM

## 2022-05-13 HISTORY — DX: Unspecified right bundle-branch block: I45.10

## 2022-05-13 MED ORDER — METOPROLOL TARTRATE 25 MG PO TABS: ORAL_TABLET | ORAL | 0 refills | Status: DC

## 2022-05-13 MED ORDER — GABAPENTIN 100 MG PO CAPS: ORAL_CAPSULE | ORAL | 0 refills | Status: DC

## 2022-05-13 NOTE — Progress Notes (Signed)
Complete physical exam  Patient: Debbie [name]   DOB: 03-Dec-1960   61 y.o. Female  MRN: [id number]  Subjective:    Chief Complaint  Patient presents with   Annual Exam    [name] is a 61 y.o. female who presents today for a complete physical exam. She reports consuming a general diet.  She does walk regularly and goes to kick boxing.   She generally feels fairly well. She reports sleeping well. She does have additional problems to discuss today.   She has just started back on CPAP.   She is not happy with weight and feels like she has not changed anything. She is active and eating the same. She has gained 9lbs in the last 6 months. S  She read about gabapentin and wants to come off of it. She is on it for menopasal symptoms. She does not like what she reads about long term use.   She had episode of tachycardia where her watch read her HR was 190s then 140s. She was sweaty and felt like heart was racing. She rested and took a tramadol and resolved. Not happened again. Denies any CP, palpitations, headaches or vision changes.    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   Sigmoid thickening 11/13/2018   Bladder mass 11/13/2018   Thyroid nodule 11/13/2018   Enlarged lymph nodes 11/13/2018   Liver nodule 11/11/2018   Abnormal CT of the abdomen 11/11/2018   Malignant neoplasm of upper-inner quadrant of right breast in female, estrogen receptor positive ([place]) 11/02/2018   Multiple food allergies 07/20/2018   Hypotension 04/29/2018   Family history of stroke  04/29/2018   Primary osteoarthritis of both hands 04/29/2018   Primary osteoarthritis of both feet 04/29/2018   Positive ANA (antinuclear antibody) 02/06/2018   Bilateral hand pain 01/23/2018   Hand joint stiff, unspecified laterality 01/23/2018   Periodic limb movement 11/23/2017   OSA (obstructive sleep apnea) 11/23/2017   Nocturnal hypoxemia 11/23/2017   Reactive airway disease, mild intermittent, uncomplicated 16/02/3709   Snoring 10/20/2017   Rectocele 10/20/2017   Non-restorative sleep 10/20/2017   No energy 10/20/2017   Chest tightness 07/09/2017   SOB (shortness of breath) 07/09/2017   Cough 07/09/2017   Fungal infection of toenail 01/17/2017   Large breasts 06/26/2016   Primary insomnia 06/26/2016   Mid back pain 06/26/2016   Chronic shoulder pain 06/26/2016   Labral tear of shoulder 08/29/2014   Elevated LFTs 05/11/2013   Class 1 obesity due to excess calories with serious comorbidity and body mass index (BMI) of 30.0 to 30.9 in adult 03/05/2013   Gastritis 02/17/2013   History of hysterectomy 01/04/2013   History of shingles 01/04/2013   Essential hypertension, benign 12/01/2012   Family history of colon cancer 12/01/2012   Past Medical History:  Diagnosis Date   Anxiety    Asthma    with severe URIs   Breast cancer ([place])    Bronchitis    Essential hypertension, benign 12/01/2012  Family history of colon cancer    Generalized anxiety disorder 12/01/2012   GERD (gastroesophageal reflux disease)    Hypertension    lost over 100lbs, no BP meds now, HCTZ is for edema   Irritable bowel syndrome (IBS)    "had for years," per patient   Personal history of chemotherapy    Personal history of radiation therapy    PONV (postoperative nausea and vomiting)    Seasonal allergies    Sleep apnea    uses CPAP nightly   Past Surgical History:  Procedure Laterality Date   ANKLE SURGERY     BREAST CYST EXCISION Bilateral    BREAST EXCISIONAL BIOPSY Bilateral     BREAST LUMPECTOMY Right 03/2019   BREAST LUMPECTOMY Left    benign. done around age 40   BREAST LUMPECTOMY WITH RADIOACTIVE SEED AND AXILLARY LYMPH NODE DISSECTION Right 04/15/2019   Procedure: RIGHT BREAST LUMPECTOMY WITH RADIOACTIVE SEED AND  TARGETED RIGHT AXILLARY LYMPH NODE DISSECTION WITH RADIOACTIVE SEED;  Surgeon: Alphonsa Overall, MD;  Location: [place];  Service: General;  Laterality: Right;   CHOLECYSTECTOMY  2012   KNEE ARTHROSCOPY W/ MEDIAL COLLATERAL LIGAMENT (MCL) REPAIR Left 10/07/2012   PORTACATH PLACEMENT N/A 11/16/2018   Procedure: INSERTION PORT-A-CATH WITH ULTRASOUND;  Surgeon: Alphonsa Overall, MD;  Location[facility];  Service: General;  Laterality: N/A;   REDUCTION MAMMAPLASTY Bilateral 11/17/2020   TONSILLECTOMY  1969   TOTAL ABDOMINAL HYSTERECTOMY  2011   with vaginal sling   Family History  Problem Relation Age of Onset   Hypertension Mother    Rheum arthritis Mother    Heart failure Mother    Hypertension Father    Alcoholism Father    Colon cancer Father 29   Lung cancer Father    Depression Father    Diabetes Father    Skin cancer Father    Hypertension Brother    Stroke Brother    Atrial fibrillation Brother    Hyperlipidemia Maternal Grandfather    Stroke Paternal Grandmother    Skin cancer Paternal Grandmother    Healthy Daughter    Healthy Daughter    Breast cancer Neg Hx    Ovarian cancer Neg Hx    Prostate cancer Neg Hx    Pancreatic cancer Neg Hx    Allergies  Allergen Reactions   Erythromycin Itching and Rash   Penicillins Swelling, Rash and Hives    Did it involve swelling of the face/tongue/throat, SOB, or low BP? Yes Did it involve sudden or severe rash/hives, skin peeling, or any reaction on the inside of your mouth or nose? No Did you need to seek medical attention at a hospital or doctor's office? Yes When did it last happen?      40+ years ago If all above answers are "NO", may proceed with cephalosporin use.  Did  it involve swelling of the face/tongue/throat, SOB, or low BP? Yes Did it involve sudden or severe rash/hives, skin peeling, or any reaction on the inside of your mouth or nose? No Did you need to seek medical attention at a hospital or doctor's office? Yes When did it last happen?      40+ years ago If all above answers are "NO", may proceed with cephalosporin use. Did it involve swelling of the face/tongue/throat, SOB, or low BP? Yes Did it involve sudden or severe rash/hives, skin peeling, or any reaction on the inside of your mouth or nose? No Did you need to seek medical attention  at a hospital or doctor's office? Yes When did it last happen?      40+ years ago If all above answers are "NO", may proceed with cephalosporin use.   Azithromycin Diarrhea   Erythromycin Base Itching   Levofloxacin     Tendon pain   Penicillin G Rash, Swelling and Hives    Patient Care Team: Lavada Mesi as PCP - General (Family Medicine) Alphonsa Overall, MD as Consulting Physician (General Surgery) Gery Pray, MD as Consulting Physician (Radiation Oncology) Burden, Lincoln Brigham, MD as Referring Physician (Ophthalmology) Larey Dresser, MD as Consulting Physician (Cardiology) Mauro Kaufmann, RN as Oncology Nurse Navigator Rockwell Germany, RN as Oncology Nurse Navigator Benay Pike, MD as Consulting Physician (Hematology and Oncology)   Outpatient Medications Prior to Visit  Medication Sig   AMBULATORY NON FORMULARY MEDICATION Continuous positive airway pressure (CPAP) machine set at autopap, with all supplemental supplies as needed.   losartan (COZAAR) 100 MG tablet Take 1 tablet (100 mg total) by mouth daily.   tamoxifen (NOLVADEX) 20 MG tablet Take 1 tablet (20 mg total) by mouth daily.   venlafaxine XR (EFFEXOR-XR) 75 MG 24 hr capsule Take 1 capsule (75 mg total) by mouth daily with breakfast.   [DISCONTINUED] gabapentin (NEURONTIN) 300 MG capsule Take 1 capsule (300 mg total) by  mouth at bedtime.   albuterol (PROAIR HFA) 108 (90 Base) MCG/ACT inhaler Inhale 2 puffs into the lungs every 4 (four) hours as needed for wheezing or shortness of breath. (Patient not taking: Reported on 05/13/2022)   calcium-vitamin D (OSCAL WITH D) 500-200 MG-UNIT tablet Take 1 tablet by mouth daily with breakfast. (Patient not taking: Reported on 05/13/2022)   Multiple Vitamin (MULTIVITAMIN WITH MINERALS) TABS tablet Take 1 tablet by mouth daily. (Patient not taking: Reported on 05/13/2022)   valACYclovir (VALTREX) 1000 MG tablet TAKE 1 TABLET TWICE A DAY AS NEEDED FOR COLD SORE (Patient not taking: Reported on 05/13/2022)   zolpidem (AMBIEN) 5 MG tablet Take 1 tablet (5 mg total) by mouth at bedtime as needed for sleep. (Patient not taking: Reported on 05/13/2022)   [DISCONTINUED] benzonatate (TESSALON) 200 MG capsule Take 1 capsule (200 mg total) by mouth 3 (three) times daily as needed for cough.   [DISCONTINUED] chlorpheniramine-HYDROcodone (TUSSIONEX) 10-8 MG/5ML SUER Take 5 mLs by mouth every 12 (twelve) hours as needed.   [DISCONTINUED] ciclopirox (PENLAC) 8 % solution Apply topically at bedtime. Apply over nail and surrounding skin. Apply daily over previous coat. After seven (7) days, may remove with alcohol and continue cycle.   [DISCONTINUED] loratadine (CLARITIN) 10 MG tablet Take 10 mg by mouth daily as needed.    [DISCONTINUED] potassium chloride (MICRO-K) 10 MEQ CR capsule TAKE 2 CAPSULES DAILY (CHANGE IN DOSAGE OR PILL SIZE)   [DISCONTINUED] trimethoprim-polymyxin b (POLYTRIM) ophthalmic solution Place 1 drop into both eyes every 4 (four) hours. For 7 days.   No facility-administered medications prior to visit.    ROS   See HPI.      Objective:     BP 138/75 (BP Location: Left Arm)   Pulse 67   Ht '5\' 6"'$  (1.676 m)   Wt 179 lb (81.2 kg)   SpO2 100%   BMI 28.89 kg/m  BP Readings from Last 3 Encounters:  05/13/22 138/75  11/28/21 139/80  08/08/21 132/75   Wt Readings  from Last 3 Encounters:  05/13/22 179 lb (81.2 kg)  11/28/21 170 lb 12.8 oz (77.5 kg)  10/31/21 160 lb (  72.6 kg)      Physical Exam  BP 138/75 (BP Location: Left Arm)   Pulse 67   Ht '5\' 6"'$  (1.676 m)   Wt 179 lb (81.2 kg)   SpO2 100%   BMI 28.89 kg/m   General Appearance:    Alert, cooperative, no distress, appears stated age  Head:    Normocephalic, without obvious abnormality, atraumatic  Eyes:    PERRL, conjunctiva/corneas clear, EOM's intact, fundi    benign, both eyes  Ears:    Normal TM's and external ear canals, both ears  Nose:   Nares normal, septum midline, mucosa normal, no drainage    or sinus tenderness  Throat:   Lips, mucosa, and tongue normal; teeth and gums normal  Neck:   Supple, symmetrical, trachea midline, no adenopathy;    thyroid:  no enlargement/tenderness/nodules; no carotid   bruit or JVD  Back:     Symmetric, no curvature, ROM normal, no CVA tenderness  Lungs:     Clear to auscultation bilaterally, respirations unlabored  Chest Wall:    No tenderness or deformity   Heart:    Regular rate and rhythm, S1 and S2 normal, no murmur, rub   or gallop     Abdomen:     Soft, non-tender, bowel sounds active all four quadrants,    no masses, no organomegaly        Extremities:   Extremities normal, atraumatic, no cyanosis or edema  Pulses:   2+ and symmetric all extremities  Skin:   Skin color, texture, turgor normal, no rashes or lesions  Lymph nodes:   Cervical, supraclavicular, and axillary nodes normal  Neurologic:   CNII-XII intact, normal strength, sensation and reflexes    Throughout   EKG: NSR at 60. Some changes with new incomplete RBBB. No ST elevation or depression.     Assessment & Plan:    Routine Health Maintenance and Physical Exam  Immunization History  Administered Date(s) Administered   Influenza,inj,Quad PF,6+ Mos 05/27/2017, 05/18/2018, 06/15/2019, 05/19/2020   Influenza-Unspecified 06/19/2014, 06/03/2015, 05/30/2016, 05/26/2017    PFIZER(Purple Top)SARS-COV-2 Vaccination 11/11/2019, 12/07/2019   Td 10/18/2011   Tdap 03/20/2012, 05/13/2022    Health Maintenance  Topic Date Due   Zoster Vaccines- Shingrix (1 of 2) Never done   COVID-19 Vaccine (3 - Pfizer risk series) 05/29/2022 (Originally 01/04/2020)   HIV Screening  08/08/2022 (Originally 05/15/1975)   MAMMOGRAM  11/17/2023   COLONOSCOPY (Pts 45-50yr Insurance coverage will need to be confirmed)  06/23/2025   TETANUS/TDAP  05/13/2032   Hepatitis C Screening  Completed   HPV VACCINES  Aged Out    Discussed health benefits of physical activity, and encouraged her to engage in regular exercise appropriate for her age and condition.  .Marland KitchenPeggie was seen today for annual exam.  Diagnoses and all orders for this visit:  Routine physical examination -     COMPLETE METABOLIC PANEL WITH GFR -     Lipid Panel w/reflex Direct LDL -     TSH -     CBC with Differential/Platelet  Essential hypertension, benign -     COMPLETE METABOLIC PANEL WITH GFR  Lipid screening -     Lipid Panel w/reflex Direct LDL  Thyroid nodule -     TSH  OSA (obstructive sleep apnea)  Tachycardia -     metoprolol tartrate (LOPRESSOR) 25 MG tablet; Take one tablet as needed for palpitations or increased heart rate. Ok to take up to twice a day. -  EKG 12-Lead -     Ambulatory referral to Cardiology  Need for Tdap vaccination -     Tdap vaccine greater than or equal to 7yo IM  RBBB -     Ambulatory referral to Cardiology  Medication management  Abnormal weight gain  Menopausal symptoms -     gabapentin (NEURONTIN) 100 MG capsule; Take one to three tablets at bedtime.  Overweight (BMI 25.0-29.9)   .Marland Kitchen Discussed 150 minutes of exercise a week.  Encouraged vitamin D 1000 units and Calcium '1300mg'$  or 4 servings of dairy a day.  Fasting labs ordered PHQ no concerns Vitals look great EKG did show some changes with incomplete RBBB, will refer to cardiology.  Watch is  catching some episodes of tachycardia Not happening enough to get heart monitor No ischemic signs and symptoms Metoprolol given as needed Discussed gabapentin taper down until she is not taking any more Strongly encouraged to get back on CPAP Mammogram/PAP UTD Colonoscopy UTD  Encouraged shingles but declined today but will consider in future Tdap given today  .Marland KitchenDiscussed low carb diet with 1500 calories and 80g of protein.  Exercising at least 150 minutes a week.  [employer] could be a Microbiologist.     Return in about 3 months (around 08/12/2022) for tachycardia.     Iran Planas, PA-C

## 2022-05-13 NOTE — Patient Instructions (Addendum)
Metoprolol as needed for heart rate Get back on CPAP Start decreasing down on gabapentin '200mg'$  nightly for 1 week then '100mg'$  nightly for 2 weeks then every other night for a week then stop. Make referral to cardiology.   Right Bundle Branch Block  Right bundle branch block (RBBB) is a problem with the way that electrical impulses pass through the heart (electrical conduction abnormality). The heart depends on an electrical pulse to beat normally. The electrical signal for a heartbeat starts in the upper chambers of the heart (atria) and then travels to the two lower chambers (left and rightventricles). An RBBB is a partial or complete block of the pathway that carries the signal to the right ventricle. If you have RBBB, the right side of your heart beats a little more slowly than the left side. RBBB may be a warning of heart disease or a lung problem. What are the causes? This condition may be caused by: Heart attack (myocardial infarction). Being born with a heart defect (congenital heart disease). A blood clot that flows into the lung (pulmonary embolism). Infection of heart muscle (myocarditis). High blood pressure. In some cases, the cause may not be known. What increases the risk? The following factors may make you more likely to develop this condition: Being female. Being 52 years of age or older. Having heart disease. Having had a heart attack or heart surgery. Having an enlarged heart. Having a hole in the walls between the chambers of the heart (septal defect). What are the signs or symptoms? This condition does not typically cause symptoms. How is this diagnosed? This condition may be diagnosed based on an electrocardiogram (ECG). It is often diagnosed when an ECG is done as part of a routine physical. You may also have imaging tests to find out more about your condition. These may include: Chest X-rays. Echocardiogram. How is this treated? Treatment may not be needed for  this condition if you do not have symptoms or any other heart problems. However, you may need to see your health care provider more often because RBBB can be a warning sign of future heart or lung problems. You may need treatment for another condition that may be causing RBBB. Follow these instructions at home: Lifestyle  Follow instructions from your health care provider about eating or drinking restrictions. Follow a heart-healthy diet and maintain a healthy weight. Work with a dietitian to create an eating plan that is best for you. Do not use any products that contain nicotine or tobacco, such as cigarettes, e-cigarettes, and chewing tobacco. If you need help quitting, ask your health care provider. Activity Get regular exercise as told by your health care provider. Return to your normal activities as told by your health care provider. Ask your health care provider what activities are safe for you. General instructions Take over-the-counter and prescription medicines only as told by your health care provider. Keep all follow-up visits as told by your health care provider. This is important. Contact a health care provider if: You are light-headed. You faint. Get help right away if: You have chest pain. You have trouble breathing. These symptoms may represent a serious problem that is an emergency. Do not wait to see if the symptoms will go away. Get medical help right away. Call your local emergency services (911 in the [place] Do not drive yourself to the hospital.  Summary For the heart to beat normally, an electrical signal must travel to the heart's lower right chamber. Right bundle branch  block (RBBB) is a partial or complete block of the pathway that carries that signal. This condition does not typically cause symptoms. Treatment may not be needed for RBBB if you do not have symptoms or any other heart problems. You may need to see your health care provider more often because RBBB can  be a warning sign of future heart or lung problems. This information is not intended to replace advice given to you by your health care provider. Make sure you discuss any questions you have with your health care provider. Document Revised: 02/02/2019 Document Reviewed: 02/02/2019 Elsevier Patient Education  [place] Maintenance, Female Adopting a healthy lifestyle and getting preventive care are important in promoting health and wellness. Ask your health care provider about: The right schedule for you to have regular tests and exams. Things you can do on your own to prevent diseases and keep yourself healthy. What should I know about diet, weight, and exercise? Eat a healthy diet  Eat a diet that includes plenty of vegetables, fruits, low-fat dairy products, and lean protein. Do not eat a lot of foods that are high in solid fats, added sugars, or sodium. Maintain a healthy weight Body mass index (BMI) is used to identify weight problems. It estimates body fat based on height and weight. Your health care provider can help determine your BMI and help you achieve or maintain a healthy weight. Get regular exercise Get regular exercise. This is one of the most important things you can do for your health. Most adults should: Exercise for at least 150 minutes each week. The exercise should increase your heart rate and make you sweat (moderate-intensity exercise). Do strengthening exercises at least twice a week. This is in addition to the moderate-intensity exercise. Spend less time sitting. Even light physical activity can be beneficial. Watch cholesterol and blood lipids Have your blood tested for lipids and cholesterol at 62 years of age, then have this test every 5 years. Have your cholesterol levels checked more often if: Your lipid or cholesterol levels are high. You are older than 62 years of age. You are at high risk for heart disease. What should I know about cancer  screening? Depending on your health history and family history, you may need to have cancer screening at various ages. This may include screening for: Breast cancer. Cervical cancer. Colorectal cancer. Skin cancer. Lung cancer. What should I know about heart disease, diabetes, and high blood pressure? Blood pressure and heart disease High blood pressure causes heart disease and increases the risk of stroke. This is more likely to develop in people who have high blood pressure readings or are overweight. Have your blood pressure checked: Every 3-5 years if you are 80-13 years of age. Every year if you are 12 years old or older. Diabetes Have regular diabetes screenings. This checks your fasting blood sugar level. Have the screening done: Once every three years after age 23 if you are at a normal weight and have a low risk for diabetes. More often and at a younger age if you are overweight or have a high risk for diabetes. What should I know about preventing infection? Hepatitis B If you have a higher risk for hepatitis B, you should be screened for this virus. Talk with your health care provider to find out if you are at risk for hepatitis B infection. Hepatitis C Testing is recommended for: Everyone born from [birth year] through 1966. Anyone with known risk factors for hepatitis  C. Sexually transmitted infections (STIs) Get screened for STIs, including gonorrhea and chlamydia, if: You are sexually active and are younger than 62 years of age. You are older than 62 years of age and your health care provider tells you that you are at risk for this type of infection. Your sexual activity has changed since you were last screened, and you are at increased risk for chlamydia or gonorrhea. Ask your health care provider if you are at risk. Ask your health care provider about whether you are at high risk for HIV. Your health care provider may recommend a prescription medicine to help prevent HIV  infection. If you choose to take medicine to prevent HIV, you should first get tested for HIV. You should then be tested every 3 months for as long as you are taking the medicine. Pregnancy If you are about to stop having your period (premenopausal) and you may become pregnant, seek counseling before you get pregnant. Take 400 to 800 micrograms (mcg) of folic acid every day if you become pregnant. Ask for birth control (contraception) if you want to prevent pregnancy. Osteoporosis and menopause Osteoporosis is a disease in which the bones lose minerals and strength with aging. This can result in bone fractures. If you are 35 years old or older, or if you are at risk for osteoporosis and fractures, ask your health care provider if you should: Be screened for bone loss. Take a calcium or vitamin D supplement to lower your risk of fractures. Be given hormone replacement therapy (HRT) to treat symptoms of menopause. Follow these instructions at home: Alcohol use Do not drink alcohol if: Your health care provider tells you not to drink. You are pregnant, may be pregnant, or are planning to become pregnant. If you drink alcohol: Limit how much you have to: 0-1 drink a day. Know how much alcohol is in your drink. In the [place] one drink equals one 12 oz bottle of beer (355 mL), one 5 oz glass of wine (148 mL), or one 1 oz glass of hard liquor (44 mL). Lifestyle Do not use any products that contain nicotine or tobacco. These products include cigarettes, chewing tobacco, and vaping devices, such as e-cigarettes. If you need help quitting, ask your health care provider. Do not use street drugs. Do not share needles. Ask your health care provider for help if you need support or information about quitting drugs. General instructions Schedule regular health, dental, and eye exams. Stay current with your vaccines. Tell your health care provider if: You often feel depressed. You have ever been abused  or do not feel safe at home. Summary Adopting a healthy lifestyle and getting preventive care are important in promoting health and wellness. Follow your health care provider's instructions about healthy diet, exercising, and getting tested or screened for diseases. Follow your health care provider's instructions on monitoring your cholesterol and blood pressure. This information is not intended to replace advice given to you by your health care provider. Make sure you discuss any questions you have with your health care provider. Document Revised: 12/25/2020 Document Reviewed: 12/25/2020 Elsevier Patient Education  Debbie [name].

## 2022-05-14 ENCOUNTER — Encounter: Payer: Self-pay | Admitting: Physician Assistant

## 2022-05-14 LAB — COMPLETE METABOLIC PANEL WITH GFR
AG Ratio: 2 (calc) (ref 1.0–2.5)
ALT: 16 U/L (ref 6–29)
AST: 18 U/L (ref 10–35)
Albumin: 3.8 g/dL (ref 3.6–5.1)
Alkaline phosphatase (APISO): 54 U/L (ref 37–153)
BUN: 10 mg/dL (ref 7–25)
CO2: 30 mmol/L (ref 20–32)
Calcium: 8.9 mg/dL (ref 8.6–10.4)
Chloride: 107 mmol/L (ref 98–110)
Creat: 0.76 mg/dL (ref 0.50–1.05)
Globulin: 1.9 g/dL (calc) (ref 1.9–3.7)
Glucose, Bld: 86 mg/dL (ref 65–99)
Potassium: 3.6 mmol/L (ref 3.5–5.3)
Sodium: 145 mmol/L (ref 135–146)
Total Bilirubin: 0.5 mg/dL (ref 0.2–1.2)
Total Protein: 5.7 g/dL — ABNORMAL LOW (ref 6.1–8.1)
eGFR: 89 mL/min/{1.73_m2} (ref >=60)

## 2022-05-14 LAB — CBC WITH DIFFERENTIAL/PLATELET
Absolute Monocytes: 465 cells/uL (ref 200–950)
Basophils Absolute: 20 cells/uL (ref 0–200)
Basophils Relative: 0.4 %
Eosinophils Absolute: 260 cells/uL (ref 15–500)
Eosinophils Relative: 5.2 %
HCT: 37.4 % (ref 35.0–45.0)
Hemoglobin: 12.9 g/dL (ref 11.7–15.5)
Lymphs Abs: 1015 cells/uL (ref 850–3900)
MCH: 30.6 pg (ref 27.0–33.0)
MCHC: 34.5 g/dL (ref 32.0–36.0)
MCV: 88.8 fL (ref 80.0–100.0)
MPV: 8.9 fL (ref 7.5–12.5)
Monocytes Relative: 9.3 %
Neutro Abs: 3240 cells/uL (ref 1500–7800)
Neutrophils Relative %: 64.8 %
Platelets: 200 10*3/uL (ref 140–400)
RBC: 4.21 10*6/uL (ref 3.80–5.10)
RDW: 12 % (ref 11.0–15.0)
Total Lymphocyte: 20.3 %
WBC: 5 10*3/uL (ref 3.8–10.8)

## 2022-05-14 LAB — LIPID PANEL W/REFLEX DIRECT LDL
Cholesterol: 185 mg/dL (ref <200)
HDL: 88 mg/dL (ref >=50)
LDL Cholesterol (Calc): 78 mg/dL (calc)
Non-HDL Cholesterol (Calc): 97 mg/dL (calc) (ref <130)
Total CHOL/HDL Ratio: 2.1 (calc) (ref <5.0)
Triglycerides: 111 mg/dL (ref <150)

## 2022-05-14 LAB — TSH: TSH: 2.14 mIU/L (ref 0.40–4.50)

## 2022-05-14 NOTE — Progress Notes (Signed)
Debbie [name],   Thyroid stable and looks good.  Hemoglobin looks good.  Cholesterol looks great.  Kidney and liver look great.  Protein a little low. Make sure you are getting enough in your diet.

## 2022-05-29 ENCOUNTER — Other Ambulatory Visit: Payer: Self-pay | Admitting: *Deleted

## 2022-05-29 DIAGNOSIS — Z170 Estrogen receptor positive status [ER+]: Secondary | ICD-10-CM

## 2022-05-30 ENCOUNTER — Inpatient Hospital Stay: Payer: BC Managed Care – PPO | Attending: Hematology and Oncology

## 2022-05-30 ENCOUNTER — Encounter: Payer: Self-pay | Admitting: Hematology and Oncology

## 2022-05-30 ENCOUNTER — Inpatient Hospital Stay (HOSPITAL_BASED_OUTPATIENT_CLINIC_OR_DEPARTMENT_OTHER): Payer: BC Managed Care – PPO | Admitting: Hematology and Oncology

## 2022-05-30 ENCOUNTER — Other Ambulatory Visit: Payer: Self-pay

## 2022-05-30 VITALS — BP 159/77 | HR 71 | Temp 98.1°F | Resp 18 | Ht 66.0 in | Wt 176.2 lb

## 2022-05-30 DIAGNOSIS — Z170 Estrogen receptor positive status [ER+]: Secondary | ICD-10-CM

## 2022-05-30 DIAGNOSIS — E041 Nontoxic single thyroid nodule: Secondary | ICD-10-CM | POA: Insufficient documentation

## 2022-05-30 DIAGNOSIS — Z79810 Long term (current) use of selective estrogen receptor modulators (SERMs): Secondary | ICD-10-CM | POA: Insufficient documentation

## 2022-05-30 DIAGNOSIS — C50211 Malignant neoplasm of upper-inner quadrant of right female breast: Secondary | ICD-10-CM

## 2022-05-30 DIAGNOSIS — Z79899 Other long term (current) drug therapy: Secondary | ICD-10-CM | POA: Insufficient documentation

## 2022-05-30 LAB — CBC WITH DIFFERENTIAL (CANCER CENTER ONLY)
Abs Immature Granulocytes: 0.01 10*3/uL (ref 0.00–0.07)
Basophils Absolute: 0 10*3/uL (ref 0.0–0.1)
Basophils Relative: 0 %
Eosinophils Absolute: 0.2 10*3/uL (ref 0.0–0.5)
Eosinophils Relative: 3 %
HCT: 39.3 % (ref 36.0–46.0)
Hemoglobin: 13.7 g/dL (ref 12.0–15.0)
Immature Granulocytes: 0 %
Lymphocytes Relative: 21 %
Lymphs Abs: 1.3 10*3/uL (ref 0.7–4.0)
MCH: 30.4 pg (ref 26.0–34.0)
MCHC: 34.9 g/dL (ref 30.0–36.0)
MCV: 87.1 fL (ref 80.0–100.0)
Monocytes Absolute: 0.7 10*3/uL (ref 0.1–1.0)
Monocytes Relative: 10 %
Neutro Abs: 4.3 10*3/uL (ref 1.7–7.7)
Neutrophils Relative %: 66 %
Platelet Count: 222 10*3/uL (ref 150–400)
RBC: 4.51 MIL/uL (ref 3.87–5.11)
RDW: 12.8 % (ref 11.5–15.5)
WBC Count: 6.5 10*3/uL (ref 4.0–10.5)
nRBC: 0 % (ref 0.0–0.2)

## 2022-05-30 LAB — CMP (CANCER CENTER ONLY)
ALT: 12 U/L (ref 0–44)
AST: 17 U/L (ref 15–41)
Albumin: 4.2 g/dL (ref 3.5–5.0)
Alkaline Phosphatase: 63 U/L (ref 38–126)
Anion gap: 7 (ref 5–15)
BUN: 11 mg/dL (ref 8–23)
CO2: 28 mmol/L (ref 22–32)
Calcium: 9 mg/dL (ref 8.9–10.3)
Chloride: 107 mmol/L (ref 98–111)
Creatinine: 0.67 mg/dL (ref 0.44–1.00)
GFR, Estimated: >60 mL/min (ref >60)
Glucose, Bld: 93 mg/dL (ref 70–99)
Potassium: 3.7 mmol/L (ref 3.5–5.1)
Sodium: 142 mmol/L (ref 135–145)
Total Bilirubin: 0.7 mg/dL (ref 0.3–1.2)
Total Protein: 6.4 g/dL — ABNORMAL LOW (ref 6.5–8.1)

## 2022-05-30 NOTE — Progress Notes (Signed)
[place]  [phone] [phone]    ID: Debbie [name] DOB: 1961-03-08  MR#: [id number]  YYT#:[id number]  Patient Care Team: Lavada Mesi as PCP - General (Family Medicine) Alphonsa Overall, MD as Consulting Physician (General Surgery) Gery Pray, MD as Consulting Physician (Radiation Oncology) Burden, Lincoln Brigham, MD as Referring Physician (Ophthalmology) Larey Dresser, MD as Consulting Physician (Cardiology) Mauro Kaufmann, RN as Oncology Nurse Navigator Rockwell Germany, RN as Oncology Nurse Navigator Benay Pike, MD as Consulting Physician (Hematology and Oncology) OTHER MD: Earl Lagos, MD (Ophthalmology)   CHIEF COMPLAINT: Estrogen receptor positive breast cancer  CURRENT TREATMENT: tamoxifen  INTERVAL HISTORY: Debbie [name] returns today for follow-up of her HER-2 positive breast cancer.  Ms. Debbie [name] is tolerating tamoxifen very well.  She is very compliant with this.  She denies any new health complaints.  She wanted to stop taking gabapentin because she was concerned about long-term side effects of gabapentin but this has been helping her hot flashes.  No other breast changes reported.  No breakthrough vaginal bleeding, lower extremity swelling or change in shortness of breath.  She has some baseline asthma which is very well controlled.  Rest of the pertinent 10 point ROS reviewed and negative  REVIEW OF SYSTEMS: A detailed review of systems today was otherwise stable.   COVID 19 VACCINATION STATUS: full vaccinated Therapist, music) x2, no booster as of December 2022  HISTORY OF CURRENT ILLNESS: From the original intake note:  Debbie [name] had routine screening mammography on 10/21/2018 showing a possible abnormality in the right breast. She felt a lump in her breast, possibly dating back to 01/2018, but she didn't think anything about it due to her history with fibrous breasts; she has had fibrous breasts all her  life and began to have mammograms at the age of 61.   She underwent right breast ultrasonography at [facility] on 10/28/2018 showing: On physical exam, a firm, fixed mass is palpated in the superior right breast. Targeted ultrasound is performed, showing an irregular hypoechoic mass at the 1 o'clock position 4 cm from the nipple. It measures 2.8 x 2.4 x 1.8 cm. There is associated peripheral and internal vascularity. An oval, circumscribed hypoechoic mass is also identified within the vicinity at the 1 o'clock position 4 cm from the nipple. It measures 1.0 x 0.9 x 0.4 cm. This corresponds with an additional mammographically identified mass in the upper inner quadrant at middle depth. This is mammographically stable dating back to at least 2014. Additional stable, circumscribed masses are scattered throughout the bilateral breasts mammographically. Evaluation of the right axilla demonstrates a single, markedly abnormal lymph node with cortical thickening up to 1 cm. No additional suspicious lymphadenopathy is identified.  Accordingly on 10/28/2018 she proceeded to biopsy of the right breast area in question. The pathology from this procedure showed ([id number]): invasive ductal carcinoma, nottingham grade II of III, 2.8 cm, 1 o'clock, 4.0 cm from the nipple. Prognostic indicators significant for: estrogen receptor, 90% positive, with strong staining intensity and progesterone receptor, 0% negative. Proliferation marker Ki67 at 20%. HER2 Positive (3+) by immunohistochemistry.   An additional biopsy of the right axilla was performed on the same day ([id number]) showing:  2. Lymph node, needle/core biopsy, inferior right axilla - Tumor cells within fragmented nodal tissue  The patient's subsequent history is as detailed above.   PAST MEDICAL HISTORY: Past Medical History:  Diagnosis Date   Anxiety    Asthma  with severe URIs   Breast cancer ([place])    Bronchitis    Essential hypertension,  benign 12/01/2012   Family history of colon cancer    Generalized anxiety disorder 12/01/2012   GERD (gastroesophageal reflux disease)    Hypertension    lost over 100lbs, no BP meds now, HCTZ is for edema   Irritable bowel syndrome (IBS)    "had for years," per patient   Personal history of chemotherapy    Personal history of radiation therapy    PONV (postoperative nausea and vomiting)    Seasonal allergies    Sleep apnea    uses CPAP nightly    PAST SURGICAL HISTORY: Past Surgical History:  Procedure Laterality Date   ANKLE SURGERY     BREAST CYST EXCISION Bilateral    BREAST EXCISIONAL BIOPSY Bilateral    BREAST LUMPECTOMY Right 03/2019   BREAST LUMPECTOMY Left    benign. done around age 61   BREAST LUMPECTOMY WITH RADIOACTIVE SEED AND AXILLARY LYMPH NODE DISSECTION Right 04/15/2019   Procedure: RIGHT BREAST LUMPECTOMY WITH RADIOACTIVE SEED AND  TARGETED RIGHT AXILLARY LYMPH NODE DISSECTION WITH RADIOACTIVE SEED;  Surgeon: Alphonsa Overall, MD;  Location: [facility];  Service: General;  Laterality: Right;   CHOLECYSTECTOMY  2012   KNEE ARTHROSCOPY W/ MEDIAL COLLATERAL LIGAMENT (MCL) REPAIR Left 10/07/2012   PORTACATH PLACEMENT N/A 11/16/2018   Procedure: INSERTION PORT-A-CATH WITH ULTRASOUND;  Surgeon: Alphonsa Overall, MD;  Location: [facility];  Service: General;  Laterality: N/A;   REDUCTION MAMMAPLASTY Bilateral 11/17/2020   TONSILLECTOMY  1969   TOTAL ABDOMINAL HYSTERECTOMY  2011   with vaginal sling    FAMILY HISTORY: Family History  Problem Relation Age of Onset   Hypertension Mother    Rheum arthritis Mother    Heart failure Mother    Hypertension Father    Alcoholism Father    Colon cancer Father 38   Lung cancer Father    Depression Father    Diabetes Father    Skin cancer Father    Hypertension Brother    Stroke Brother    Atrial fibrillation Brother    Hyperlipidemia Maternal Grandfather    Stroke Paternal Grandmother    Skin cancer Paternal  Grandmother    Healthy Daughter    Healthy Daughter    Breast cancer Neg Hx    Ovarian cancer Neg Hx    Prostate cancer Neg Hx    Pancreatic cancer Neg Hx   Samariah's father died from lung cancer at age 62. Patients' mother died from congestive heart failure at age 70. The patient has 1 brother. Patient denies anyone in her family having breast, ovarian, prostate, or pancreatic cancer. Sherida's father was diagnosed with skin cancer, colon cancer, and lung cancer. [name]'s paternal grandmother was diagnosed with skin cancer.    GYNECOLOGIC HISTORY:  No LMP recorded. Patient has had a hysterectomy. 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 Her husband, Ascencion Stegner, is in [employer]	[place]. They have two daughters, Kerri Perches and Wellington. [address] Dawna Part is 20, lives in [place], [place], and works in Building surveyor. Adrian Blackwater Livingston Wheeler is 21, lives in [place], [place], [place], and is getting married in 2021   ADVANCED DIRECTIVES: In the absence  of any documentation, Talynn's spouse, [name], is her healthcare power of attorney.      HEALTH MAINTENANCE: Social History   Tobacco Use   Smoking status: Never   Smokeless tobacco: Never  Vaping Use   Vaping Use: Never used  Substance Use Topics   Alcohol use: Yes    Comment: rare   Drug use: No    Colonoscopy: yes, 2015, [facility]   PAP: 2011  Bone density: 07/2019, -1.8 Mammography: 10/21/2018  Allergies  Allergen Reactions   Erythromycin Itching and Rash   Penicillins Swelling, Rash and Hives    Did it involve swelling of the face/tongue/throat, SOB, or low BP? Yes Did it involve sudden or severe rash/hives, skin peeling, or any reaction on the inside of  your mouth or nose? No Did you need to seek medical attention at a hospital or doctor's office? Yes When did it last happen?      40+ years ago If all above answers are "NO", may proceed with cephalosporin use.  Did it involve swelling of the face/tongue/throat, SOB, or low BP? Yes Did it involve sudden or severe rash/hives, skin peeling, or any reaction on the inside of your mouth or nose? No Did you need to seek medical attention at a hospital or doctor's office? Yes When did it last happen?      40+ years ago If all above answers are "NO", may proceed with cephalosporin use. Did it involve swelling of the face/tongue/throat, SOB, or low BP? Yes Did it involve sudden or severe rash/hives, skin peeling, or any reaction on the inside of your mouth or nose? No Did you need to seek medical attention at a hospital or doctor's office? Yes When did it last happen?      40+ years ago If all above answers are "NO", may proceed with cephalosporin use.   Azithromycin Diarrhea   Erythromycin Base Itching   Levofloxacin     Tendon pain   Penicillin G Rash, Swelling and Hives    Current Outpatient Medications  Medication Sig Dispense Refill   albuterol (PROAIR HFA) 108 (90 Base) MCG/ACT inhaler Inhale 2 puffs into the lungs every 4 (four) hours as needed for wheezing or shortness of breath. (Patient not taking: Reported on 05/13/2022) 20.1 g 0   AMBULATORY NON FORMULARY MEDICATION Continuous positive airway pressure (CPAP) machine set at autopap, with all supplemental supplies as needed. 1 each 0   calcium-vitamin D (OSCAL WITH D) 500-200 MG-UNIT tablet Take 1 tablet by mouth daily with breakfast. (Patient not taking: Reported on 05/13/2022)     gabapentin (NEURONTIN) 100 MG capsule Take one to three tablets at bedtime. 90 capsule 0   losartan (COZAAR) 100 MG tablet Take 1 tablet (100 mg total) by mouth daily. 90 tablet 3   metoprolol tartrate (LOPRESSOR) 25 MG tablet Take one tablet as needed for  palpitations or increased heart rate. Ok to take up to twice a day. 30 tablet 0   Multiple Vitamin (MULTIVITAMIN WITH MINERALS) TABS tablet Take 1 tablet by mouth daily. (Patient not taking: Reported on 05/13/2022)     tamoxifen (NOLVADEX) 20 MG tablet Take 1 tablet (20 mg total) by mouth daily. 90 tablet 4   valACYclovir (VALTREX) 1000 MG tablet TAKE 1 TABLET TWICE A DAY AS NEEDED FOR COLD SORE (Patient not taking: Reported on 05/13/2022) 60 tablet 11   venlafaxine XR (EFFEXOR-XR) 75 MG 24 hr capsule Take 1 capsule (75 mg total) by mouth daily with breakfast. 90  capsule 3   zolpidem (AMBIEN) 5 MG tablet Take 1 tablet (5 mg total) by mouth at bedtime as needed for sleep. (Patient not taking: Reported on 05/13/2022) 90 tablet 0   No current facility-administered medications for this visit.    OBJECTIVE:  white woman who appears well  Vitals:   05/30/22 1304  BP: (!) 159/77  Pulse: 71  Resp: 18  Temp: 98.1 F (36.7 C)  SpO2: 99%     Body mass index is 28.44 kg/m.   Wt Readings from Last 3 Encounters:  05/30/22 176 lb 3.2 oz (79.9 kg)  05/13/22 179 lb (81.2 kg)  11/28/21 170 lb 12.8 oz (77.5 kg)  ECOG FS:1 - Symptomatic but completely ambulatory  Physical Exam Constitutional:      Appearance: Normal appearance.  Cardiovascular:     Rate and Rhythm: Normal rate and regular rhythm.  Chest:     Comments: Bilateral breasts with excellent cosmetic outcome.  No definitive palpable masses however she has very dense breast hence may not be able to feel small masses.  No regional adenopathy.  Musculoskeletal:     Cervical back: Normal range of motion and neck supple. No rigidity.  Lymphadenopathy:     Cervical: No cervical adenopathy.  Neurological:     Mental Status: She is alert.       LAB RESULTS:  CMP     Component Value Date/Time   NA 142 05/30/2022 1235   K 3.7 05/30/2022 1235   CL 107 05/30/2022 1235   CO2 28 05/30/2022 1235   GLUCOSE 93 05/30/2022 1235   BUN 11  05/30/2022 1235   CREATININE 0.67 05/30/2022 1235   CREATININE 0.76 05/13/2022 0000   CALCIUM 9.0 05/30/2022 1235   PROT 6.4 (L) 05/30/2022 1235   ALBUMIN 4.2 05/30/2022 1235   AST 17 05/30/2022 1235   ALT 12 05/30/2022 1235   ALKPHOS 63 05/30/2022 1235   BILITOT 0.7 05/30/2022 1235   GFRNONAA >60 05/30/2022 1235   GFRNONAA 90 08/25/2020 0000   GFRAA 104 08/25/2020 0000    No results found for: "TOTALPROTELP", "ALBUMINELP", "A1GS", "A2GS", "BETS", "BETA2SER", "GAMS", "MSPIKE", "SPEI"  No results found for: "KPAFRELGTCHN", "LAMBDASER", "KAPLAMBRATIO"  Lab Results  Component Value Date   WBC 6.5 05/30/2022   NEUTROABS 4.3 05/30/2022   HGB 13.7 05/30/2022   HCT 39.3 05/30/2022   MCV 87.1 05/30/2022   PLT 222 05/30/2022    No results found for: "LABCA2"  No components found for: "KMMNOT771"  No results for input(s): "INR" in the last 168 hours.  No results found for: "LABCA2"  No results found for: "HAF790"  No results found for: "CAN125"  No results found for: "CAN153"  No results found for: "CA2729"  No components found for: "HGQUANT"  No results found for: "CEA1", "CEA" / No results found for: "CEA1", "CEA"   No results found for: "AFPTUMOR"  No results found for: "CHROMOGRNA"  No results found for: "HGBA", "HGBA2QUANT", "HGBFQUANT", "HGBSQUAN" (Hemoglobinopathy evaluation)   No results found for: "LDH"  Lab Results  Component Value Date   IRON 46 05/11/2013   TIBC 254 05/11/2013   IRONPCTSAT 18 (L) 05/11/2013   (Iron and TIBC)  Lab Results  Component Value Date   FERRITIN 72 05/11/2013    Urinalysis    Component Value Date/Time   BILIRUBINUR Small 11/09/2018 0914   PROTEINUR Negative 11/09/2018 0914   UROBILINOGEN 0.2 11/09/2018 0914   NITRITE Negative 11/09/2018 0914   LEUKOCYTESUR Negative 11/09/2018 0914  STUDIES:  No results found.   ELIGIBLE FOR AVAILABLE RESEARCH PROTOCOL: no   ASSESSMENT: 61 y.o. [place] Ser, [place] woman  status post right breast upper inner quadrant biopsy 10/28/2018 for a clinical T2 N1, stage IIA invasive ductal carcinoma, grade 2, estrogen receptor positive, progesterone receptor negative, HER-2 amplified, with an MIB-1 of 20%  (a) staging work-up with CT scans of the chest abdomen and pelvis and thyroid ultrasonography shows multiple findings that will require follow-up:   (i) two 0.5 cm liver lesions   (ii) possible bladder mass, thickened sigmoid and rectum, and prominent adnexa   (iii) thyroid nodule biopsied 11/19/2018, read as [place] 3 (risk of malignancy 5-15%)   (iiii)  thyroid lymph node biopsied 11/19/2018 showing no evidence of carcinoma   (iiiii) abd MRI 05/07 2020 confirms two tiny liver lesions. No bladder or adnexal malignancy  (1) neoadjuvant chemotherapy consisting of carboplatin, docetaxel, trastuzumab, and pertuzumab every 21 days x 6 started 11/17/2018, copleted 03/02/2019  (a) pertuzumab discontinued after cycle 1 due to severe diarrhea  (2) trastuzumab continued to complete 1 year (last dose 10/19/2019).).  (a) baseline echocardiogram 11/11/2018 showed an ejection fraction in the 55-60% range  (b) echocardiogram 03/10/2019 shows EF of 60-65%  (c) echo 06/08/2019 shows EF 60%  (d) echo 09/14/2018 shows an ejection fraction in the 60-65% range.  (e) echo 12/14/2019 shows an ejection fraction in the 60-65% range  (3) Right breast lumpectomy on 04/15/2019: no residual carcinoma (ypT0 ypN0)  (a) a total of 4 lymph nodes were removed  (4) adjuvant radiation 05/31/2019 - 07/14/2019 Site Technique Total Dose (Gy) Dose per Fx (Gy) Completed Fx Beam Energies  Breast: Breast_Rt 3D 50.4/50.4 1.8 28/28 6X, 10X  Breast: Breast_Rt_axilla 3D 45/45 1.8 25/25 6X, 10X   (5) started tamoxifen 08/20/2019  (a) s/p hysterectomy  (b) bone density 07/30/2019 shows a T score of -1.8   (c) used oral contraceptives for 7 or 8 years in the past with no complications  (6) thyroid nodules  followed by Dr Armandina Gemma  (7) very small liver abnormalities noted on CT March 2020 and MRI May 2020 require follow-up   (a) repeat liver MRI 06/06/2020 shows these to be small harmartomas or cysts   PLAN:  Patient is here for follow-up on adjuvant tamoxifen.  She continues to have some hot flashes which are very well controlled with 100 mg of gabapentin nightly.  She was little bit concerned about gabapentin induced dementia, she read some information on this.  We have discussed that this is not a common complication and she is on a very small dose of gabapentin so encouraged her to continue it as needed. Physical examination today with postlumpectomy changes and postradiation changes in the right breast and postsurgical changes in the left breast. She had excellent cosmetic outcome after bilateral reduction mammoplasty.  She once again was encouraged to contact us with any new questions or concerns.  Self breast exam recommended monthly. Return to clinic in 6 months.  Total time spent: 30 minutes  *Total Encounter Time as defined by the Centers for Medicare and Medicaid Services includes, in addition to the face-to-face time of a patient visit (documented in the note above) non-face-to-face time: obtaining and reviewing outside history, ordering and reviewing medications, tests or procedures, care coordination (communications with other health care professionals or caregivers) and documentation in the medical record.

## 2022-07-09 DIAGNOSIS — J45909 Unspecified asthma, uncomplicated: Secondary | ICD-10-CM | POA: Insufficient documentation

## 2022-07-09 DIAGNOSIS — C50919 Malignant neoplasm of unspecified site of unspecified female breast: Secondary | ICD-10-CM | POA: Insufficient documentation

## 2022-07-09 DIAGNOSIS — F419 Anxiety disorder, unspecified: Secondary | ICD-10-CM | POA: Insufficient documentation

## 2022-07-09 DIAGNOSIS — Z9889 Other specified postprocedural states: Secondary | ICD-10-CM | POA: Insufficient documentation

## 2022-07-09 DIAGNOSIS — K219 Gastro-esophageal reflux disease without esophagitis: Secondary | ICD-10-CM | POA: Insufficient documentation

## 2022-07-09 DIAGNOSIS — J302 Other seasonal allergic rhinitis: Secondary | ICD-10-CM | POA: Insufficient documentation

## 2022-07-09 DIAGNOSIS — K589 Irritable bowel syndrome without diarrhea: Secondary | ICD-10-CM | POA: Insufficient documentation

## 2022-07-09 DIAGNOSIS — Z923 Personal history of irradiation: Secondary | ICD-10-CM | POA: Insufficient documentation

## 2022-07-09 DIAGNOSIS — J40 Bronchitis, not specified as acute or chronic: Secondary | ICD-10-CM | POA: Insufficient documentation

## 2022-07-10 ENCOUNTER — Ambulatory Visit: Payer: BC Managed Care – PPO | Attending: Cardiology | Admitting: Cardiology

## 2022-07-10 ENCOUNTER — Ambulatory Visit: Payer: BC Managed Care – PPO | Attending: Cardiology

## 2022-07-10 ENCOUNTER — Encounter: Payer: Self-pay | Admitting: Cardiology

## 2022-07-10 VITALS — BP 122/72 | HR 90 | Ht 66.6 in | Wt 173.0 lb

## 2022-07-10 DIAGNOSIS — I10 Essential (primary) hypertension: Secondary | ICD-10-CM | POA: Diagnosis not present

## 2022-07-10 DIAGNOSIS — I2510 Atherosclerotic heart disease of native coronary artery without angina pectoris: Secondary | ICD-10-CM

## 2022-07-10 DIAGNOSIS — I2584 Coronary atherosclerosis due to calcified coronary lesion: Secondary | ICD-10-CM

## 2022-07-10 DIAGNOSIS — R002 Palpitations: Secondary | ICD-10-CM

## 2022-07-10 DIAGNOSIS — G4733 Obstructive sleep apnea (adult) (pediatric): Secondary | ICD-10-CM

## 2022-07-10 DIAGNOSIS — R011 Cardiac murmur, unspecified: Secondary | ICD-10-CM

## 2022-07-10 DIAGNOSIS — R7989 Other specified abnormal findings of blood chemistry: Secondary | ICD-10-CM

## 2022-07-10 DIAGNOSIS — R0683 Snoring: Secondary | ICD-10-CM

## 2022-07-10 DIAGNOSIS — E876 Hypokalemia: Secondary | ICD-10-CM

## 2022-07-10 HISTORY — DX: Atherosclerotic heart disease of native coronary artery without angina pectoris: I25.10

## 2022-07-10 HISTORY — DX: Palpitations: R00.2

## 2022-07-10 NOTE — Patient Instructions (Signed)
Medication Instructions:  Your physician recommends that you continue on your current medications as directed. Please refer to the Current Medication list given to you today.  *If you need a refill on your cardiac medications before your next appointment, please call your pharmacy*   Lab Work: Your physician recommends that you have labs done in the office today for a complete metabolic panel.   [address] 2nd floor M-W 8-11:30 and 1-4:30 and Thursday and Friday 8-11:30.  If you have labs (blood work) drawn today and your tests are completely normal, you will receive your results only by: [place] (if you have MyChart) OR A paper copy in the mail If you have any lab test that is abnormal or we need to change your treatment, we will call you to review the results.   Testing/Procedures: Your physician has requested that you have an echocardiogram. Echocardiography is a painless test that uses sound waves to create images of your heart. It provides your doctor with information about the size and shape of your heart and how well your heart's chambers and valves are working. This procedure takes approximately one hour. There are no restrictions for this procedure.  Your physician has recommended that you wear an event monitor. Event monitors are medical devices that record the heart's electrical activity. Doctors most often [place] these monitors to diagnose arrhythmias. Arrhythmias are problems with the speed or rhythm of the heartbeat. The monitor is a small, portable device. You can wear one while you do your normal daily activities. This is usually used to diagnose what is causing palpitations/syncope (passing out). This is a 30 day monitor.   Follow-Up: At [facility], you and your health needs are our priority.  As part of our continuing mission to provide you with exceptional heart care, we have created designated Provider Care Teams.  These Care Teams include your  primary Cardiologist (physician) and Advanced Practice Providers (APPs -  Physician Assistants and Nurse Practitioners) who all work together to provide you with the care you need, when you need it.  We recommend signing up for the patient portal called "MyChart".  Sign up information is provided on this After Visit Summary.  MyChart is used to connect with patients for Virtual Visits (Telemedicine).  Patients are able to view lab/test results, encounter notes, upcoming appointments, etc.  Non-urgent messages can be sent to your provider as well.   To learn more about what you can do with MyChart, go to [email or web address].    Your next appointment:   9 month(s)  The format for your next appointment:   In Person  Provider:   Jyl Heinz, MD   Other Instructions Echocardiogram An echocardiogram is a test that uses sound waves (ultrasound) to produce images of the heart. Images from an echocardiogram can provide important information about: Heart size and shape. The size and thickness and movement of your heart's walls. Heart muscle function and strength. Heart valve function or if you have stenosis. Stenosis is when the heart valves are too narrow. If blood is flowing backward through the heart valves (regurgitation). A tumor or infectious growth around the heart valves. Areas of heart muscle that are not working well because of poor blood flow or injury from a heart attack. Aneurysm detection. An aneurysm is a weak or damaged part of an artery wall. The wall bulges out from the normal force of blood pumping through the body. Tell a health care provider about: Any allergies you have.  All medicines you are taking, including vitamins, herbs, eye drops, creams, and over-the-counter medicines. Any blood disorders you have. Any surgeries you have had. Any medical conditions you have. Whether you are pregnant or may be pregnant. What are the risks? Generally, this is a safe test.  However, problems may occur, including an allergic reaction to dye (contrast) that may be used during the test. What happens before the test? No specific preparation is needed. You may eat and drink normally. What happens during the test? You will take off your clothes from the waist up and put on a hospital gown. Electrodes or electrocardiogram (ECG)patches may be placed on your chest. The electrodes or patches are then connected to a device that monitors your heart rate and rhythm. You will lie down on a table for an ultrasound exam. A gel will be applied to your chest to help sound waves pass through your skin. A handheld device, called a transducer, will be pressed against your chest and moved over your heart. The transducer produces sound waves that travel to your heart and bounce back (or "echo" back) to the transducer. These sound waves will be captured in real-time and changed into images of your heart that can be viewed on a video monitor. The images will be recorded on a computer and reviewed by your health care provider. You may be asked to change positions or hold your breath for a short time. This makes it easier to get different views or better views of your heart. In some cases, you may receive contrast through an IV in one of your veins. This can improve the quality of the pictures from your heart. The procedure may vary among health care providers and hospitals.   What can I expect after the test? You may return to your normal, everyday life, including diet, activities, and medicines, unless your health care provider tells you not to do that. Follow these instructions at home: It is up to you to get the results of your test. Ask your health care provider, or the department that is doing the test, when your results will be ready. Keep all follow-up visits. This is important. Summary An echocardiogram is a test that uses sound waves (ultrasound) to produce images of the heart. Images  from an echocardiogram can provide important information about the size and shape of your heart, heart muscle function, heart valve function, and other possible heart problems. You do not need to do anything to prepare before this test. You may eat and drink normally. After the echocardiogram is completed, you may return to your normal, everyday life, unless your health care provider tells you not to do that. This information is not intended to replace advice given to you by your health care provider. Make sure you discuss any questions you have with your health care provider. Document Revised: 03/28/2020 Document Reviewed: 03/28/2020 Elsevier Patient Education  2021 [employer].

## 2022-07-10 NOTE — Progress Notes (Signed)
Cardiology Office Note:    Date:  07/10/2022   ID:  BETZAIDA CREMEENS, DOB 06-22-1961, MRN [id number]  PCP:  Donella Stade, PA-C  Cardiologist:  Jenean Lindau, MD   Referring MD: Donella Stade, PA-C    ASSESSMENT:    1. Essential hypertension, benign   2. Coronary artery calcification   3. Palpitations   4. Elevated LFTs   5. Snoring   6. OSA (obstructive sleep apnea)   7. Murmur, cardiac    PLAN:    In order of problems listed above:  Coronary artery calcification: Secondary prevention stressed with the patient.  Importance of compliance with diet medication stressed and she vocalized understanding.  She was advised to walk at least half an hour a day on a regular basis and she does so 5 days a week.  She is asymptomatic. Palpitations: Blood work is fine.  TSH is unremarkable.  We will do a 1 month monitor to assess this.  She also has cardia app and I actively encouraged her to use it. Cardiac murmur: Echocardiogram will be done to assess murmur heard on auscultation. Sleep apnea: Palpitations were discussed.  She has been diagnosed with sleep apnea.  She uses the machine.  I want her to see our partners, our sleep specialist to review this in view of her significant symptoms of palpitations. Patient will be seen in follow-up appointment in 6 months or earlier if the patient has any concerns    Medication Adjustments/Labs and Tests Ordered: Current medicines are reviewed at length with the patient today.  Concerns regarding medicines are outlined above.  No orders of the defined types were placed in this encounter.  No orders of the defined types were placed in this encounter.    No chief complaint on file.    History of Present Illness:    Kiley L Schier is a 61 y.o. female.  Patient has undergone treatment for breast cancer in the past.  She also has history of receiving chemotherapy for breast cancer.  She gives history of palpitations.  She has sleep  apnea.  She has had a sleep apnea machine and has used it for 5 years.  She has never seen a doctor for this.  She tells me that her palpitations occur sporadically.  It happened in August and lasted the whole day.  She did not record this tracing.  Subsequently she has a cardia app.  At the time of my evaluation, the patient is alert awake oriented and in no distress.  Past Medical History:  Diagnosis Date   Abnormal CT of the abdomen 11/11/2018   Anxiety    Asthma    with severe URIs   Bilateral hand pain 01/23/2018   Bladder mass 11/13/2018   Breast cancer ([place])    Bronchitis    Chest tightness 07/09/2017   Chronic shoulder pain 06/26/2016   Class 1 obesity due to excess calories with serious comorbidity and body mass index (BMI) of 30.0 to 30.9 in adult 03/05/2013   Dr. Valetta Close: Qsymia started July 2014, sleep study ordered.   Cough 07/09/2017   Elevated LFTs 05/11/2013   Liver biopsy normal spring 2015   Enlarged lymph nodes 11/13/2018   Essential hypertension, benign 12/01/2012   Family history of colon cancer    Family history of stroke 04/29/2018   Fungal infection of toenail 01/17/2017   Gastritis 02/17/2013   GERD (gastroesophageal reflux disease)    Hand joint stiff, unspecified laterality 01/23/2018  History of hysterectomy 01/04/2013   History of shingles 01/04/2013   Age 37   Hypotension 04/29/2018   Irritable bowel syndrome (IBS)    "had for years," per patient   Labral tear of shoulder 08/29/2014   Large breasts 06/26/2016   Liver lesion 11/13/2018   Liver nodule 11/11/2018   Malignant neoplasm of upper-inner quadrant of right breast in female, estrogen receptor positive ([place]) 11/02/2018   Mid back pain 06/26/2016   Multiple food allergies 07/20/2018   Wheat/casein/corn/peanuts. IgG labs 2019.   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   No energy 10/20/2017   Nocturnal hypoxemia 11/23/2017   Non-restorative sleep 10/20/2017   OSA  (obstructive sleep apnea) 11/23/2017   Periodic limb movement 11/23/2017   Personal history of chemotherapy    Personal history of radiation therapy    PONV (postoperative nausea and vomiting)    Port-A-Cath in place 12/29/2018   Positive ANA (antinuclear antibody) 02/06/2018   Primary insomnia 06/26/2016   Primary osteoarthritis of both feet 04/29/2018   Primary osteoarthritis of both hands 04/29/2018   RBBB 05/13/2022   Reactive airway disease, mild intermittent, uncomplicated 74/94/4967   Rectocele 10/20/2017   Seasonal allergies    Sigmoid thickening 11/13/2018   Snoring 10/20/2017   SOB (shortness of breath) 07/09/2017   Stress 08/28/2020   Tachycardia 08/28/2020   Thyroid nodule 11/13/2018    Past Surgical History:  Procedure Laterality Date   ANKLE SURGERY     BREAST CYST EXCISION Bilateral    BREAST EXCISIONAL BIOPSY Bilateral    BREAST LUMPECTOMY Right 03/2019   BREAST LUMPECTOMY Left    benign. done around age 76   BREAST LUMPECTOMY WITH RADIOACTIVE SEED AND AXILLARY LYMPH NODE DISSECTION Right 04/15/2019   Procedure: RIGHT BREAST LUMPECTOMY WITH RADIOACTIVE SEED AND  TARGETED RIGHT AXILLARY LYMPH NODE DISSECTION WITH RADIOACTIVE SEED;  Surgeon: Alphonsa Overall, MD;  Location: [facility];  Service: General;  Laterality: Right;   CHOLECYSTECTOMY  2012   KNEE ARTHROSCOPY W/ MEDIAL COLLATERAL LIGAMENT (MCL) REPAIR Left 10/07/2012   PORTACATH PLACEMENT N/A 11/16/2018   Procedure: INSERTION PORT-A-CATH WITH ULTRASOUND;  Surgeon: Alphonsa Overall, MD;  Location: [facility];  Service: General;  Laterality: N/A;   REDUCTION MAMMAPLASTY Bilateral 11/17/2020   TONSILLECTOMY  1969   TOTAL ABDOMINAL HYSTERECTOMY  2011   with vaginal sling    Current Medications: Current Meds  Medication Sig   albuterol (PROAIR HFA) 108 (90 Base) MCG/ACT inhaler Inhale 2 puffs into the lungs every 4 (four) hours as needed for wheezing or shortness of breath.   AMBULATORY NON FORMULARY  MEDICATION Continuous positive airway pressure (CPAP) machine set at autopap, with all supplemental supplies as needed.   calcium-vitamin D (OSCAL WITH D) 500-200 MG-UNIT tablet Take 1 tablet by mouth daily with breakfast.   gabapentin (NEURONTIN) 100 MG capsule Take one to three tablets at bedtime.   losartan (COZAAR) 100 MG tablet Take 1 tablet (100 mg total) by mouth daily.   metoprolol tartrate (LOPRESSOR) 25 MG tablet Take one tablet as needed for palpitations or increased heart rate. Ok to take up to twice a day.   Multiple Vitamin (MULTIVITAMIN WITH MINERALS) TABS tablet Take 1 tablet by mouth daily.   tamoxifen (NOLVADEX) 20 MG tablet Take 1 tablet (20 mg total) by mouth daily.   valACYclovir (VALTREX) 1000 MG tablet TAKE 1 TABLET TWICE A DAY AS NEEDED FOR COLD SORE   venlafaxine XR (EFFEXOR-XR) 75 MG 24 hr capsule Take 1  capsule (75 mg total) by mouth daily with breakfast.   zolpidem (AMBIEN) 5 MG tablet Take 1 tablet (5 mg total) by mouth at bedtime as needed for sleep.     Allergies:   Erythromycin, Penicillins, Azithromycin, Erythromycin base, Levofloxacin, and Penicillin g   Social History   Socioeconomic History   Marital status: Married    Spouse name: [name]   Number of children: 2   Years of education: Not on file   Highest education level: Not on file  Occupational History   Not on file  Tobacco Use   Smoking status: Never   Smokeless tobacco: Never  Vaping Use   Vaping Use: Never used  Substance and Sexual Activity   Alcohol use: Yes    Comment: rare   Drug use: No   Sexual activity: Not Currently  Other Topics Concern   Not on file  Social History Narrative   Not on file   Social Determinants of Health   Financial Resource Strain: Not on file  Food Insecurity: Not on file  Transportation Needs: Not on file  Physical Activity: Not on file  Stress: Not on file  Social Connections: Not on file     Family History: The patient's family history includes  Alcoholism in her father; Atrial fibrillation in her brother; Colon cancer (age of onset: 78) in her father; Depression in her father; Diabetes in her father; Healthy in her daughter and daughter; Heart failure in her mother; Hyperlipidemia in her maternal grandfather; Hypertension in her brother, father, and mother; Lung cancer in her father; Rheum arthritis in her mother; Skin cancer in her father and paternal grandmother; Stroke in her brother and paternal grandmother. There is no history of Breast cancer, Ovarian cancer, Prostate cancer, or Pancreatic cancer.  ROS:   Please see the history of present illness.    All other systems reviewed and are negative.  EKGs/Labs/Other Studies Reviewed:    The following studies were reviewed today: EKG reveals sinus rhythm and nonspecific ST-T changes   Recent Labs: 05/13/2022: TSH 2.14 05/30/2022: ALT 12; BUN 11; Creatinine 0.67; Hemoglobin 13.7; Platelet Count 222; Potassium 3.7; Sodium 142  Recent Lipid Panel    Component Value Date/Time   CHOL 185 05/13/2022 0000   TRIG 111 05/13/2022 0000   HDL 88 05/13/2022 0000   CHOLHDL 2.1 05/13/2022 0000   VLDL 17 06/09/2019 1236   LDLCALC 78 05/13/2022 0000    Physical Exam:    VS:  BP 122/72   Pulse 90   Ht 5' 6.6" (1.692 m)   Wt 173 lb (78.5 kg)   SpO2 97%   BMI 27.42 kg/m     Wt Readings from Last 3 Encounters:  07/10/22 173 lb (78.5 kg)  05/30/22 176 lb 3.2 oz (79.9 kg)  05/13/22 179 lb (81.2 kg)     GEN: Patient is in no acute distress HEENT: Normal NECK: No JVD; No carotid bruits LYMPHATICS: No lymphadenopathy CARDIAC: Hear sounds regular, 2/6 systolic murmur at the apex. RESPIRATORY:  Clear to auscultation without rales, wheezing or rhonchi  ABDOMEN: Soft, non-tender, non-distended MUSCULOSKELETAL:  No edema; No deformity  SKIN: Warm and dry NEUROLOGIC:  Alert and oriented x 3 PSYCHIATRIC:  Normal affect   Signed, Jenean Lindau, MD  07/10/2022 10:09 AM    [place]

## 2022-07-11 ENCOUNTER — Other Ambulatory Visit: Payer: Self-pay | Admitting: Physician Assistant

## 2022-07-11 DIAGNOSIS — I10 Essential (primary) hypertension: Secondary | ICD-10-CM

## 2022-07-11 LAB — COMPREHENSIVE METABOLIC PANEL
ALT: 22 IU/L (ref 0–32)
AST: 25 IU/L (ref 0–40)
Albumin/Globulin Ratio: 2.3 — ABNORMAL HIGH (ref 1.2–2.2)
Albumin: 4.5 g/dL (ref 3.9–4.9)
Alkaline Phosphatase: 97 IU/L (ref 44–121)
BUN/Creatinine Ratio: 16 (ref 12–28)
BUN: 12 mg/dL (ref 8–27)
Bilirubin Total: 0.5 mg/dL (ref 0.0–1.2)
CO2: 29 mmol/L (ref 20–29)
Calcium: 9.9 mg/dL (ref 8.7–10.3)
Chloride: 100 mmol/L (ref 96–106)
Creatinine, Ser: 0.73 mg/dL (ref 0.57–1.00)
Globulin, Total: 2 g/dL (ref 1.5–4.5)
Glucose: 87 mg/dL (ref 70–99)
Potassium: 3.2 mmol/L — ABNORMAL LOW (ref 3.5–5.2)
Sodium: 143 mmol/L (ref 134–144)
Total Protein: 6.5 g/dL (ref 6.0–8.5)
eGFR: 93 mL/min/{1.73_m2} (ref >59)

## 2022-07-15 NOTE — Addendum Note (Signed)
Addended by: Truddie Hidden on: 07/15/2022 05:38 PM   Modules accepted: Orders

## 2022-08-06 ENCOUNTER — Ambulatory Visit (HOSPITAL_COMMUNITY): Payer: BC Managed Care – PPO | Attending: Cardiology

## 2022-08-06 DIAGNOSIS — R011 Cardiac murmur, unspecified: Secondary | ICD-10-CM | POA: Diagnosis not present

## 2022-08-06 LAB — ECHOCARDIOGRAM COMPLETE
Area-P 1/2: 2.57 cm2
S' Lateral: 2.7 cm

## 2022-08-16 ENCOUNTER — Ambulatory Visit: Payer: BC Managed Care – PPO | Admitting: Physician Assistant

## 2022-08-21 ENCOUNTER — Other Ambulatory Visit: Payer: Self-pay | Admitting: Physician Assistant

## 2022-08-21 DIAGNOSIS — U071 COVID-19: Secondary | ICD-10-CM

## 2022-08-21 DIAGNOSIS — R051 Acute cough: Secondary | ICD-10-CM

## 2022-08-22 MED ORDER — ALBUTEROL SULFATE HFA 108 (90 BASE) MCG/ACT IN AERS: 2.0000 | INHALATION_SPRAY | RESPIRATORY_TRACT | 0 refills | Status: DC | PRN

## 2022-08-23 ENCOUNTER — Encounter: Payer: Self-pay | Admitting: Physician Assistant

## 2022-08-23 ENCOUNTER — Ambulatory Visit: Payer: BC Managed Care – PPO | Admitting: Physician Assistant

## 2022-08-23 VITALS — BP 113/52 | HR 75 | Ht 66.0 in | Wt 179.1 lb

## 2022-08-23 DIAGNOSIS — I10 Essential (primary) hypertension: Secondary | ICD-10-CM

## 2022-08-23 DIAGNOSIS — G4733 Obstructive sleep apnea (adult) (pediatric): Secondary | ICD-10-CM

## 2022-08-23 DIAGNOSIS — F439 Reaction to severe stress, unspecified: Secondary | ICD-10-CM

## 2022-08-23 DIAGNOSIS — R000 Tachycardia, unspecified: Secondary | ICD-10-CM | POA: Diagnosis not present

## 2022-08-23 DIAGNOSIS — R002 Palpitations: Secondary | ICD-10-CM

## 2022-08-23 MED ORDER — VENLAFAXINE HCL ER 75 MG PO CP24: 75.0000 mg | ORAL_CAPSULE | Freq: Every day | ORAL | 3 refills | Status: DC

## 2022-08-23 NOTE — Progress Notes (Signed)
Established Patient Office Visit  Subjective   Patient ID: Debbie [name], female    DOB: 1960/12/09  Age: 62 y.o. MRN: [id number]  No chief complaint on file.   HPI Pt is a 62 yo female with history of breast cancer in remission, palpitations, HTN, OSA, stress and anxiety who presents to the clinic for follow up.   She saw cardiology on 07/10/2022. He did not make any medication changes and set her up with heart monitor and echocardiogram for one month. She would like to know what results mean. She has not had any palpitations since the original 3. She is using her CPAP nightly. She is sleeping better. She has not needed ambien to sleep. Mood is good on effexor.    .. Active Ambulatory Problems    Diagnosis Date Noted   Essential hypertension, benign 12/01/2012   Family history of colon cancer 12/01/2012   History of hysterectomy 01/04/2013   History of shingles 01/04/2013   Gastritis 02/17/2013   Class 1 obesity due to excess calories with serious comorbidity and body mass index (BMI) of 30.0 to 30.9 in adult 03/05/2013   Elevated LFTs 05/11/2013   Labral tear of shoulder 08/29/2014   Large breasts 06/26/2016   Primary insomnia 06/26/2016   Mid back pain 06/26/2016   Chronic shoulder pain 06/26/2016   Fungal infection of toenail 01/17/2017   Chest tightness 07/09/2017   SOB (shortness of breath) 07/09/2017   Cough 07/09/2017   Snoring 10/20/2017   Rectocele 10/20/2017   Non-restorative sleep 10/20/2017   No energy 10/20/2017   Reactive airway disease, mild intermittent, uncomplicated 32/20/2542   Periodic limb movement 11/23/2017   OSA (obstructive sleep apnea) 11/23/2017   Nocturnal hypoxemia 11/23/2017   Bilateral hand pain 01/23/2018   Hand joint stiff, unspecified laterality 01/23/2018   Positive ANA (antinuclear antibody) 02/06/2018   Hypotension 04/29/2018   Family history of stroke 04/29/2018   Primary osteoarthritis of both hands 04/29/2018   Primary  osteoarthritis of both feet 04/29/2018   Multiple food allergies 07/20/2018   Malignant neoplasm of upper-inner quadrant of right breast in female, estrogen receptor positive ([place]) 11/02/2018   Liver nodule 11/11/2018   Abnormal CT of the abdomen 11/11/2018   Liver lesion 11/13/2018   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   Sigmoid thickening 11/13/2018   Bladder mass 11/13/2018   Thyroid nodule 11/13/2018   Enlarged lymph nodes 11/13/2018   Port-A-Cath in place 12/29/2018   Stress 08/28/2020   Tachycardia 08/28/2020   RBBB 05/13/2022   Anxiety 07/09/2022   Asthma 07/09/2022   Breast cancer ([place]) 07/09/2022   Bronchitis 07/09/2022   Personal history of chemotherapy 12/01/2012   Personal history of radiation therapy 07/09/2022   PONV (postoperative nausea and vomiting) 07/09/2022   Seasonal allergies 07/09/2022   GERD (gastroesophageal reflux disease) 07/09/2022   Irritable bowel syndrome (IBS) 07/09/2022   Coronary artery calcification 07/10/2022   Palpitations 07/10/2022   Resolved Ambulatory Problems    Diagnosis Date Noted   Generalized anxiety disorder 12/01/2012   No Additional Past Medical History    Review of Systems  All other systems reviewed and are negative.     Objective:     There were no vitals taken for this visit. BP Readings from Last 3 Encounters:  08/23/22 (!) 113/52  07/10/22 122/72  05/30/22 (!) 159/77   Wt Readings from Last 3 Encounters:  08/23/22 179 lb 1.9 oz (81.2 kg)  07/10/22 173 lb (78.5 kg)  05/30/22 176 lb 3.2 oz (79.9 kg)    ..    08/23/2022    9:55 AM 05/13/2022    9:11 AM 08/08/2021   11:01 AM 11/28/2017    6:01 PM 07/08/2017   11:33 AM  Depression screen PHQ 2/9  Decreased Interest 0 0 0 2 0  Down, Depressed, Hopeless 0 0 0 0 0  PHQ - 2 Score 0 0 0 2 0  Altered sleeping  1 1 0   Tired, decreased energy  '1 1 2   '$ Change in appetite  '3 1 3   '$ Feeling bad or failure about yourself   0 0 0   Trouble  concentrating  0 0 0   Moving slowly or fidgety/restless  0 0 0   Suicidal thoughts  0 0 0   PHQ-9 Score  '5 3 7   '$ Difficult doing work/chores  Not difficult at all Not difficult at all Somewhat difficult      Physical Exam Constitutional:      Appearance: Normal appearance.  HENT:     Head: Normocephalic.  Neck:     Vascular: No carotid bruit.  Cardiovascular:     Rate and Rhythm: Normal rate and regular rhythm.     Heart sounds: Murmur heard.  Pulmonary:     Effort: Pulmonary effort is normal.     Breath sounds: Normal breath sounds.  Neurological:     General: No focal deficit present.     Mental Status: She is alert and oriented to person, place, and time.  Psychiatric:        Mood and Affect: Mood normal.        The 10-year ASCVD risk score (Arnett DK, et al., 2019) is: 3.6%    Assessment & Plan:  Marland KitchenMarland KitchenPeggie was seen today for heart problem.  Diagnoses and all orders for this visit:  Tachycardia  Stress -     venlafaxine XR (EFFEXOR-XR) 75 MG 24 hr capsule; Take 1 capsule (75 mg total) by mouth daily with breakfast.  OSA (obstructive sleep apnea)  Essential hypertension, benign  Palpitations    Palpitations resolved Vitals look great today Echo and heart monitor overall no worrisome findings She is having some PVCs and increased HR on heart monitor Ok to used as needed metoprolol Continue to use CPAP nightly Took ambien off med list Effexor refilled PHQ numbers look good  Strongly encouraged patient to get shingles vaccine HO given Pt will consider  Follow up in 6 months   Iran Planas, PA-C

## 2022-08-23 NOTE — Patient Instructions (Signed)
Recombinant Zoster (Shingles) Vaccine: What You Need to Know 1. Why get vaccinated? Recombinant zoster (shingles) vaccine can prevent shingles. Shingles (also called herpes zoster, or just zoster) is a painful skin rash, usually with blisters. In addition to the rash, shingles can cause fever, headache, chills, or upset stomach. Rarely, shingles can lead to complications such as pneumonia, hearing problems, blindness, brain inflammation (encephalitis), or death. The risk of shingles increases with age. The most common complication of shingles is long-term nerve pain called postherpetic neuralgia (PHN). PHN occurs in the areas where the shingles rash was and can last for months or years after the rash goes away. The pain from PHN can be severe and debilitating. The risk of PHN increases with age. An older adult with shingles is more likely to develop PHN and have longer lasting and more severe pain than a younger person. People with weakened immune systems also have a higher risk of getting shingles and complications from the disease. Shingles is caused by varicella-zoster virus, the same virus that causes chickenpox. After you have chickenpox, the virus stays in your body and can cause shingles later in life. Shingles cannot be passed from one person to another, but the virus that causes shingles can spread and cause chickenpox in someone who has never had chickenpox or has never received chickenpox vaccine. 2. Recombinant shingles vaccine Recombinant shingles vaccine provides strong protection against shingles. By preventing shingles, recombinant shingles vaccine also protects against PHN and other complications. Recombinant shingles vaccine is recommended for: Adults 68 years and older Adults 19 years and older who have a weakened immune system because of disease or treatments Shingles vaccine is given as a two-dose series. For most people, the second dose should be given 2 to 6 months after the first  dose. Some people who have or will have a weakened immune system can get the second dose 1 to 2 months after the first dose. Ask your health care provider for guidance. People who have had shingles in the past and people who have received varicella (chickenpox) vaccine are recommended to get recombinant shingles vaccine. The vaccine is also recommended for people who have already gotten another type of shingles vaccine, the live shingles vaccine. There is no live virus in recombinant shingles vaccine. Shingles vaccine may be given at the same time as other vaccines. 3. Talk with your health care provider Tell your vaccination provider if the person getting the vaccine: Has had an allergic reaction after a previous dose of recombinant shingles vaccine, or has any severe, life-threatening allergies Is currently experiencing an episode of shingles Is pregnant In some cases, your health care provider may decide to postpone shingles vaccination until a future visit. People with minor illnesses, such as a cold, may be vaccinated. People who are moderately or severely ill should usually wait until they recover before getting recombinant shingles vaccine. Your health care provider can give you more information. 4. Risks of a vaccine reaction A sore arm with mild or moderate pain is very common after recombinant shingles vaccine. Redness and swelling can also happen at the site of the injection. Tiredness, muscle pain, headache, shivering, fever, stomach pain, and nausea are common after recombinant shingles vaccine. These side effects may temporarily prevent a vaccinated person from doing regular activities. Symptoms usually go away on their own in 2 to 3 days. You should still get the second dose of recombinant shingles vaccine even if you had one of these reactions after the first dose. Guillain-Barr  syndrome (GBS), a serious nervous system disorder, has been reported very rarely after recombinant zoster  vaccine. People sometimes faint after medical procedures, including vaccination. Tell your provider if you feel dizzy or have vision changes or ringing in the ears. As with any medicine, there is a very remote chance of a vaccine causing a severe allergic reaction, other serious injury, or death. 5. What if there is a serious problem? An allergic reaction could occur after the vaccinated person leaves the clinic. If you see signs of a severe allergic reaction (hives, swelling of the face and throat, difficulty breathing, a fast heartbeat, dizziness, or weakness), call 9-1-1 and get the person to the nearest hospital. For other signs that concern you, call your health care provider. Adverse reactions should be reported to the Vaccine Adverse Event Reporting System (VAERS). Your health care provider will usually file this report, or you can do it yourself. Visit the VAERS website at www.vaers.SamedayNews.es or call 603-759-6393. VAERS is only for reporting reactions, and VAERS staff members do not give medical advice. 6. How can I learn more? Ask your health care provider. Call your local or state health department. Visit the website of the Food and Drug Administration (FDA) for vaccine package inserts and additional information at http://lopez-wang.org/. Contact the Centers for Disease Control and Prevention (CDC): Call 515-560-1533 (1-800-CDC-INFO) or Visit CDC's website at http://hunter.com/. Source: CDC Vaccine Information Statement Recombinant Zoster Vaccine (09/22/2020) This same material is available at http://www.wolf.info/ for no charge. This information is not intended to replace advice given to you by your health care provider. Make sure you discuss any questions you have with your health care provider. Document Revised: 07/03/2021 Document Reviewed: 10/06/2020 Elsevier Patient Education  [place].

## 2022-09-02 ENCOUNTER — Other Ambulatory Visit: Payer: Self-pay

## 2022-09-02 MED ORDER — TAMOXIFEN CITRATE 20 MG PO TABS: 20.0000 mg | ORAL_TABLET | Freq: Every day | ORAL | 4 refills | Status: DC

## 2022-09-04 ENCOUNTER — Telehealth: Payer: Self-pay

## 2022-09-04 DIAGNOSIS — I4729 Other ventricular tachycardia: Secondary | ICD-10-CM

## 2022-09-04 NOTE — Telephone Encounter (Signed)
-----  Message from Jenean Lindau, MD sent at 09/04/2022  1:09 PM EST ----- Mildly abnormal.  Please bring her in for a stress echo copy primary care Jenean Lindau, MD 09/04/2022 1:09 PM

## 2022-09-05 NOTE — Addendum Note (Signed)
Addended by: Truddie Hidden on: 09/05/2022 12:49 PM   Modules accepted: Orders

## 2022-09-06 NOTE — Addendum Note (Signed)
Addended by: Jyl Heinz R on: 09/06/2022 05:35 PM   Modules accepted: Orders

## 2022-09-12 DIAGNOSIS — Z713 Dietary counseling and surveillance: Secondary | ICD-10-CM | POA: Diagnosis not present

## 2022-09-17 DIAGNOSIS — L570 Actinic keratosis: Secondary | ICD-10-CM | POA: Diagnosis not present

## 2022-09-17 DIAGNOSIS — D485 Neoplasm of uncertain behavior of skin: Secondary | ICD-10-CM | POA: Diagnosis not present

## 2022-09-17 DIAGNOSIS — H02403 Unspecified ptosis of bilateral eyelids: Secondary | ICD-10-CM | POA: Diagnosis not present

## 2022-09-18 ENCOUNTER — Telehealth (HOSPITAL_COMMUNITY): Payer: Self-pay | Admitting: *Deleted

## 2022-09-18 NOTE — Telephone Encounter (Signed)
Pt reached and given instructions for stress echo on 09/19/22. Hold metoprolol.

## 2022-09-19 ENCOUNTER — Ambulatory Visit (HOSPITAL_BASED_OUTPATIENT_CLINIC_OR_DEPARTMENT_OTHER): Payer: BC Managed Care – PPO

## 2022-09-19 ENCOUNTER — Ambulatory Visit (HOSPITAL_COMMUNITY): Payer: BC Managed Care – PPO | Attending: Cardiology

## 2022-09-19 DIAGNOSIS — I4729 Other ventricular tachycardia: Secondary | ICD-10-CM | POA: Insufficient documentation

## 2022-09-19 MED ORDER — PERFLUTREN LIPID MICROSPHERE
1.0000 mL | INTRAVENOUS | Status: AC | PRN
  Administered 2022-09-19: 2 mL via INTRAVENOUS
  Administered 2022-09-19: 3 mL via INTRAVENOUS
  Administered 2022-09-19: 2 mL via INTRAVENOUS
  Administered 2022-09-19: 3 mL via INTRAVENOUS

## 2022-09-20 DIAGNOSIS — Z853 Personal history of malignant neoplasm of breast: Secondary | ICD-10-CM | POA: Diagnosis not present

## 2022-09-20 DIAGNOSIS — Z1151 Encounter for screening for human papillomavirus (HPV): Secondary | ICD-10-CM | POA: Diagnosis not present

## 2022-09-20 DIAGNOSIS — N816 Rectocele: Secondary | ICD-10-CM | POA: Diagnosis not present

## 2022-09-20 DIAGNOSIS — Z01419 Encounter for gynecological examination (general) (routine) without abnormal findings: Secondary | ICD-10-CM | POA: Diagnosis not present

## 2022-09-26 DIAGNOSIS — D485 Neoplasm of uncertain behavior of skin: Secondary | ICD-10-CM | POA: Diagnosis not present

## 2022-09-26 DIAGNOSIS — L570 Actinic keratosis: Secondary | ICD-10-CM | POA: Diagnosis not present

## 2022-10-17 ENCOUNTER — Ambulatory Visit: Payer: BC Managed Care – PPO | Admitting: Cardiology

## 2022-10-20 ENCOUNTER — Encounter: Payer: Self-pay | Admitting: Physician Assistant

## 2022-10-21 ENCOUNTER — Ambulatory Visit: Payer: BC Managed Care – PPO | Admitting: Physician Assistant

## 2022-10-21 VITALS — BP 131/75 | HR 76 | Ht 66.0 in | Wt 184.0 lb

## 2022-10-21 DIAGNOSIS — L309 Dermatitis, unspecified: Secondary | ICD-10-CM

## 2022-10-21 DIAGNOSIS — R21 Rash and other nonspecific skin eruption: Secondary | ICD-10-CM | POA: Diagnosis not present

## 2022-10-21 MED ORDER — METHYLPREDNISOLONE SODIUM SUCC 125 MG IJ SOLR
125.0000 mg | Freq: Once | INTRAMUSCULAR | Status: AC
  Administered 2022-10-21: 125 mg via INTRAMUSCULAR

## 2022-10-21 MED ORDER — VALACYCLOVIR HCL 1 G PO TABS: 1000.0000 mg | ORAL_TABLET | Freq: Three times a day (TID) | ORAL | 0 refills | Status: DC

## 2022-10-21 MED ORDER — TRIAMCINOLONE ACETONIDE 0.1 % EX CREA: 1.0000 | TOPICAL_CREAM | Freq: Two times a day (BID) | CUTANEOUS | 0 refills | Status: DC

## 2022-10-21 NOTE — Progress Notes (Unsigned)
   Acute Office Visit  Subjective:     Patient ID: Debbie [name], female    DOB: Nov 01, 1960, 62 y.o.   MRN: [id number]  Chief Complaint  Patient presents with   Rash    Rash   Patient is a 62 yo female in today for rash on her R forearm, R hip, and feet. She says it started 5 days ago and feels it is getting worse. Rash on right arm she describes as "little bumps leaking yellow fluid and crusting over". Rash on other areas looks like "red blotches". Rash is mainly itchy but not very painful. Has been using benadryl spray and keeping it covered with a bandage. She has felt fatigued for the past week as well. She thinks it may be poison ivy because she was pulling up weeds in the woods about 10 days ago. She also had a cold sore last week and started taking her valcyclovir. She has had shingles before when she was 8. No numbness, tingling, fever or chills.   Review of Systems  Skin:  Positive for rash.  See HPI     Objective:    BP 131/75   Pulse 76   Ht '5\' 6"'$  (1.676 m)   Wt 83.5 kg   SpO2 98%   BMI 29.70 kg/m  {Vitals History (Optional):23777}  Physical Exam Constitutional:      Appearance: Normal appearance.  Musculoskeletal:        General: Normal range of motion.  Skin:    Findings: Rash present.     Comments: R forearm: open and closed vesicular lesions on erythematous base with yellow fluid discharge R hip: two 1cm erythematous papular lesions  Feet: one 1cm erythematous macular lesion on dorsum of feet bilaterally  Neurological:     Mental Status: She is alert and oriented to person, place, and time.     No results found for any visits on 10/21/22.      Assessment & Plan:   Debbie [name] was seen today for rash.  Diagnoses and all orders for this visit:  Rash -     methylPREDNISolone sodium succinate (SOLU-MEDROL) 125 mg/2 mL injection 125 mg -     Varicella Zoster,Rapid Method,Culture  Dermatitis  Other orders -     valACYclovir (VALTREX) 1000 MG  tablet; Take 1 tablet (1,000 mg total) by mouth 3 (three) times daily. For shingles. -     triamcinolone cream (KENALOG) 0.1 %; Apply 1 Application topically 2 (two) times daily.   Unclear etiology if it is a masked shingles infection or contact dermatitis, pt has history of both  Start triamcinolone cream BID  Gave prednisone injection in office Start valtrex TID x 21 days  Keep rash covered and clean    Follow up as needed   Stephens November, [employer]

## 2022-10-21 NOTE — Telephone Encounter (Signed)
Please schedule visit for today!

## 2022-10-21 NOTE — Patient Instructions (Addendum)
Continue valtrex three times a day for 7 days.  Triamcinolone as needed for itchy rash.  Will culture for shingles

## 2022-10-22 ENCOUNTER — Encounter: Payer: Self-pay | Admitting: Physician Assistant

## 2022-10-24 ENCOUNTER — Encounter: Payer: Self-pay | Admitting: Hematology and Oncology

## 2022-10-28 LAB — VARICELLA ZOSTER VIRUS,RAPID METHOD,CULTURE

## 2022-10-28 NOTE — Progress Notes (Signed)
Shingles not detected.

## 2022-11-14 ENCOUNTER — Encounter: Payer: Self-pay | Admitting: Physician Assistant

## 2022-11-14 DIAGNOSIS — Z780 Asymptomatic menopausal state: Secondary | ICD-10-CM

## 2022-11-14 DIAGNOSIS — Z13820 Encounter for screening for osteoporosis: Secondary | ICD-10-CM

## 2022-11-15 ENCOUNTER — Ambulatory Visit: Payer: BC Managed Care – PPO | Admitting: Cardiology

## 2022-11-18 ENCOUNTER — Ambulatory Visit
Admission: RE | Admit: 2022-11-18 | Discharge: 2022-11-18 | Disposition: A | Discharge status: Home / Self Care | Payer: BC Managed Care – PPO | Source: Ambulatory Visit | Attending: Hematology and Oncology | Admitting: Hematology and Oncology

## 2022-11-18 DIAGNOSIS — Z170 Estrogen receptor positive status [ER+]: Secondary | ICD-10-CM

## 2022-11-18 DIAGNOSIS — Z853 Personal history of malignant neoplasm of breast: Secondary | ICD-10-CM | POA: Diagnosis not present

## 2022-11-26 DIAGNOSIS — H04552 Acquired stenosis of left nasolacrimal duct: Secondary | ICD-10-CM | POA: Diagnosis not present

## 2022-11-29 ENCOUNTER — Inpatient Hospital Stay: Payer: BC Managed Care – PPO | Attending: Hematology and Oncology | Admitting: Hematology and Oncology

## 2022-11-29 VITALS — BP 141/71 | HR 69 | Temp 97.7°F | Resp 16 | Ht 66.0 in | Wt 187.6 lb

## 2022-11-29 DIAGNOSIS — C50211 Malignant neoplasm of upper-inner quadrant of right female breast: Secondary | ICD-10-CM | POA: Insufficient documentation

## 2022-11-29 DIAGNOSIS — Z800 Family history of malignant neoplasm of digestive organs: Secondary | ICD-10-CM | POA: Diagnosis not present

## 2022-11-29 DIAGNOSIS — Z170 Estrogen receptor positive status [ER+]: Secondary | ICD-10-CM | POA: Diagnosis not present

## 2022-11-29 DIAGNOSIS — Z9221 Personal history of antineoplastic chemotherapy: Secondary | ICD-10-CM | POA: Diagnosis not present

## 2022-11-29 DIAGNOSIS — Z79810 Long term (current) use of selective estrogen receptor modulators (SERMs): Secondary | ICD-10-CM | POA: Insufficient documentation

## 2022-11-29 DIAGNOSIS — Z79899 Other long term (current) drug therapy: Secondary | ICD-10-CM | POA: Diagnosis not present

## 2022-11-29 DIAGNOSIS — Z801 Family history of malignant neoplasm of trachea, bronchus and lung: Secondary | ICD-10-CM | POA: Diagnosis not present

## 2022-11-29 DIAGNOSIS — Z923 Personal history of irradiation: Secondary | ICD-10-CM | POA: Insufficient documentation

## 2022-11-29 NOTE — Progress Notes (Signed)
No meds had [facility]  [phone] [phone]    ID: Debbie [name] DOB: 07/14/1961  MR#: [id number]  ZHY#:[id number]  Patient Care Team: Nolene Ebbs as PCP - General (Family Medicine) Ovidio Kin, MD as Consulting Physician (General Surgery) Antony Blackbird, MD as Consulting Physician (Radiation Oncology) Burden, Eli Phillips, MD as Referring Physician (Ophthalmology) Laurey Morale, MD as Consulting Physician (Cardiology) Pershing Proud, RN as Oncology Nurse Navigator Donnelly Angelica, RN as Oncology Nurse Navigator Rachel Moulds, MD as Consulting Physician (Hematology and Oncology) OTHER MD: Corky Sox, MD (Ophthalmology)  CHIEF COMPLAINT: Estrogen receptor positive breast cancer  CURRENT TREATMENT: tamoxifen  INTERVAL HISTORY:  Debbie [name] returns today for follow-up of her HER-2 positive breast cancer.  Ms. Debbie [name] is tolerating tamoxifen very well.  She is very compliant with this.  She denies any new health complaints.  No breakthrough vaginal bleeding, lower extremity swelling or change in shortness of breath.  She stopped taking gabapentin for hot flashes. She hasn't noted any change in the hot flashes since she discontinued.  Rest of the pertinent 10 point ROS reviewed and negative  REVIEW OF SYSTEMS: A detailed review of systems today was otherwise stable.   COVID 19 VACCINATION STATUS: full vaccinated Proofreader) x2, no booster as of December 2022  HISTORY OF CURRENT ILLNESS: From the original intake note:  Debbie [name] had routine screening mammography on 10/21/2018 showing a possible abnormality in the right breast. She felt a lump in her breast, possibly dating back to 01/2018, but she didn't think anything about it due to her history with fibrous breasts; she has had fibrous breasts all her life and began to have mammograms at the age of 47.   She underwent right breast ultrasonography at [facility] on 10/28/2018 showing: On physical exam, a firm, fixed mass is palpated in the superior right breast. Targeted ultrasound is performed, showing an irregular hypoechoic mass at the 1 o'clock position 4 cm from the nipple. It measures 2.8 x 2.4 x 1.8 cm. There is associated peripheral and internal vascularity. An oval, circumscribed hypoechoic mass is also identified within the vicinity at the 1 o'clock position 4 cm from the nipple. It measures 1.0 x 0.9 x 0.4 cm. This corresponds with an additional mammographically identified mass in the upper inner quadrant at middle depth. This is mammographically stable dating back to at least 2014. Additional stable, circumscribed masses are scattered throughout the bilateral breasts mammographically. Evaluation of the right axilla demonstrates a single, markedly abnormal lymph node with cortical thickening up to 1 cm. No additional suspicious lymphadenopathy is identified.  Accordingly on 10/28/2018 she proceeded to biopsy of the right breast area in question. The pathology from this procedure showed ([id number]): invasive ductal carcinoma, nottingham grade II of III, 2.8 cm, 1 o'clock, 4.0 cm from the nipple. Prognostic indicators significant for: estrogen receptor, 90% positive, with strong staining intensity and progesterone receptor, 0% negative. Proliferation marker Ki67 at 20%. HER2 Positive (3+) by immunohistochemistry.   An additional biopsy of the right axilla was performed on the same day ([id number]) showing:  2. Lymph node, needle/core biopsy, inferior right axilla - Tumor cells within fragmented nodal tissue  The patient's subsequent history is as detailed above.   PAST MEDICAL HISTORY: Past Medical History:  Diagnosis Date   Abnormal CT of the abdomen 11/11/2018   Anxiety    Asthma    with severe URIs   Bilateral hand pain 01/23/2018  Bladder mass 11/13/2018   Breast cancer    Bronchitis    Chest tightness 07/09/2017   Chronic  shoulder pain 06/26/2016   Class 1 obesity due to excess calories with serious comorbidity and body mass index (BMI) of 30.0 to 30.9 in adult 03/05/2013   Dr. Cathey Endow: Qsymia started July 2014, sleep study ordered.   Cough 07/09/2017   Elevated LFTs 05/11/2013   Liver biopsy normal spring 2015   Enlarged lymph nodes 11/13/2018   Essential hypertension, benign 12/01/2012   Family history of colon cancer    Family history of stroke 04/29/2018   Fungal infection of toenail 01/17/2017   Gastritis 02/17/2013   GERD (gastroesophageal reflux disease)    Hand joint stiff, unspecified laterality 01/23/2018   History of hysterectomy 01/04/2013   History of shingles 01/04/2013   Age 62   Hypotension 04/29/2018   Irritable bowel syndrome (IBS)    "had for years," per patient   Labral tear of shoulder 08/29/2014   Large breasts 06/26/2016   Liver lesion 11/13/2018   Liver nodule 11/11/2018   Malignant neoplasm of upper-inner quadrant of right breast in female, 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 IgG labs 2019.   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   No energy 10/20/2017   Nocturnal hypoxemia 11/23/2017   Non-restorative sleep 10/20/2017   OSA (obstructive sleep apnea) 11/23/2017   Periodic limb movement 11/23/2017   Personal history of chemotherapy    Personal history of radiation therapy    PONV (postoperative nausea and vomiting)    Port-A-Cath in place 12/29/2018   Positive ANA (antinuclear antibody) 02/06/2018   Primary insomnia 06/26/2016   Primary osteoarthritis of both feet 04/29/2018   Primary osteoarthritis of both hands 04/29/2018   RBBB 05/13/2022   Reactive airway disease, mild intermittent, uncomplicated 10/21/2017   Rectocele 10/20/2017   Seasonal allergies    Sigmoid thickening 11/13/2018   Snoring 10/20/2017   SOB (shortness of breath) 07/09/2017    Stress 08/28/2020   Tachycardia 08/28/2020   Thyroid nodule 11/13/2018    PAST SURGICAL HISTORY: Past Surgical History:  Procedure Laterality Date   ANKLE SURGERY     BREAST CYST EXCISION Bilateral    BREAST EXCISIONAL BIOPSY Bilateral    BREAST LUMPECTOMY Right 03/2019   BREAST LUMPECTOMY Left    benign. done around age 45   BREAST LUMPECTOMY WITH RADIOACTIVE SEED AND AXILLARY LYMPH NODE DISSECTION Right 04/15/2019   Procedure: RIGHT BREAST LUMPECTOMY WITH RADIOACTIVE SEED AND  TARGETED RIGHT AXILLARY LYMPH NODE DISSECTION WITH RADIOACTIVE SEED;  Surgeon: Ovidio Kin, MD;  Location: [facility];  Service: General;  Laterality: Right;   CHOLECYSTECTOMY  2012   KNEE ARTHROSCOPY W/ MEDIAL COLLATERAL LIGAMENT (MCL) REPAIR Left 10/07/2012   PORTACATH PLACEMENT N/A 11/16/2018   Procedure: INSERTION PORT-A-CATH WITH ULTRASOUND;  Surgeon: Ovidio Kin, MD;  Location: [facility];  Service: General;  Laterality: N/A;   REDUCTION MAMMAPLASTY Bilateral 11/17/2020   TONSILLECTOMY  1969   TOTAL ABDOMINAL HYSTERECTOMY  2011   with vaginal sling    FAMILY HISTORY: Family History  Problem Relation Age of Onset   Hypertension Mother    Rheum arthritis Mother    Heart failure Mother    Hypertension Father    Alcoholism Father    Colon cancer Father 70   Lung cancer Father    Depression Father    Diabetes Father  Skin cancer Father    Hypertension Brother    Stroke Brother    Atrial fibrillation Brother    Hyperlipidemia Maternal Grandfather    Stroke Paternal Grandmother    Skin cancer Paternal Grandmother    Healthy Daughter    Healthy Daughter    Breast cancer Neg Hx    Ovarian cancer Neg Hx    Prostate cancer Neg Hx    Pancreatic cancer Neg Hx   Anasophia's father died from lung cancer at age 74. Patients' mother died from congestive heart failure at age 72. The patient has 1 brother. Patient denies anyone in her family having breast, ovarian, prostate, or pancreatic  cancer. Kymoni's father was diagnosed with skin cancer, colon cancer, and lung cancer. [name]'s paternal grandmother was diagnosed with skin cancer.    GYNECOLOGIC HISTORY:  No LMP recorded. Patient has had a hysterectomy. 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 Her husband, Takyla Boik, is in [employer]. They have two daughters, Jodelle Green and [place]. [address] Clare Gandy is 42, lives in [place], [place], and works in Special educational needs teacher. Jules Husbands Clarendon is 39, lives in [place], [place], and is getting married in 2021   ADVANCED DIRECTIVES: In the absence of any documentation, Vincie's spouse, Harvie Heck, is her healthcare power of attorney.      HEALTH MAINTENANCE: Social History   Tobacco Use   Smoking status: Never   Smokeless tobacco: Never  Vaping Use   Vaping Use: Never used  Substance Use Topics   Alcohol use: Yes    Comment: rare   Drug use: No    Colonoscopy: yes, 2015, [facility]   PAP: 2011  Bone density: 07/2019, -1.8 Mammography: 10/21/2018  Allergies  Allergen Reactions   Erythromycin Itching and Rash   Penicillins Swelling, Rash and Hives    Did it involve swelling of the face/tongue/throat, SOB, or low BP? Yes Did it involve sudden or severe rash/hives, skin peeling, or any reaction on the inside of your mouth or nose? No Did you need to seek medical attention at a hospital or doctor's office? Yes When did it last happen?      40+ years ago If all above answers are "NO", may proceed with cephalosporin use.  Did it involve swelling of the face/tongue/throat, SOB, or low BP? Yes Did it involve sudden or severe rash/hives, skin peeling, or any reaction on the inside of your  mouth or nose? No Did you need to seek medical attention at a hospital or doctor's office? Yes When did it last happen?      40+ years ago If all above answers are "NO", may proceed with cephalosporin use. Did it involve swelling of the face/tongue/throat, SOB, or low BP? Yes Did it involve sudden or severe rash/hives, skin peeling, or any reaction on the inside of your mouth or nose? No Did you need to seek medical attention at a hospital or doctor's office? Yes When did it last happen?      40+ years ago If all above answers are "NO", may proceed with cephalosporin use.   Azithromycin Diarrhea   Erythromycin Base Itching   Levofloxacin     Tendon pain   Penicillin G Rash, Swelling and Hives  Current Outpatient Medications  Medication Sig Dispense Refill   albuterol (PROAIR HFA) 108 (90 Base) MCG/ACT inhaler Inhale 2 puffs into the lungs every 4 (four) hours as needed for wheezing or shortness of breath. 20.1 g 0   AMBULATORY NON FORMULARY MEDICATION Continuous positive airway pressure (CPAP) machine set at autopap, with all supplemental supplies as needed. 1 each 0   calcium-vitamin D (OSCAL WITH D) 500-200 MG-UNIT tablet Take 1 tablet by mouth daily with breakfast.     losartan (COZAAR) 100 MG tablet TAKE 1 TABLET DAILY 90 tablet 3   metoprolol tartrate (LOPRESSOR) 25 MG tablet Take one tablet as needed for palpitations or increased heart rate. Ok to take up to twice a day. 30 tablet 0   Multiple Vitamin (MULTIVITAMIN WITH MINERALS) TABS tablet Take 1 tablet by mouth daily.     tamoxifen (NOLVADEX) 20 MG tablet Take 1 tablet (20 mg total) by mouth daily. 90 tablet 4   triamcinolone cream (KENALOG) 0.1 % Apply 1 Application topically 2 (two) times daily. 30 g 0   valACYclovir (VALTREX) 1000 MG tablet TAKE 1 TABLET TWICE A DAY AS NEEDED FOR COLD SORE 60 tablet 11   valACYclovir (VALTREX) 1000 MG tablet Take 1 tablet (1,000 mg total) by mouth 3 (three) times daily. For shingles. 21  tablet 0   venlafaxine XR (EFFEXOR-XR) 75 MG 24 hr capsule Take 1 capsule (75 mg total) by mouth daily with breakfast. 90 capsule 3   No current facility-administered medications for this visit.    OBJECTIVE:  white woman who appears well  Vitals:   11/29/22 1320  BP: (!) 141/71  Pulse: 69  Resp: 16  Temp: 97.7 F (36.5 C)  SpO2: 98%     Body mass index is 30.28 kg/m.   Wt Readings from Last 3 Encounters:  11/29/22 187 lb 9.6 oz (85.1 kg)  10/21/22 184 lb (83.5 kg)  08/23/22 179 lb 1.9 oz (81.2 kg)  ECOG FS:1 - Symptomatic but completely ambulatory  Physical Exam Constitutional:      Appearance: Normal appearance.  Cardiovascular:     Rate and Rhythm: Normal rate and regular rhythm.  Chest:     Comments: Bilateral breasts with excellent cosmetic outcome.  No definitive palpable masses however she has very dense breast hence may not be able to feel small masses.  No regional adenopathy.  Musculoskeletal:     Cervical back: Normal range of motion and neck supple. No rigidity.  Lymphadenopathy:     Cervical: No cervical adenopathy.  Neurological:     Mental Status: She is alert.       LAB RESULTS:  CMP     Component Value Date/Time   NA 143 07/10/2022 1035   K 3.2 (L) 07/10/2022 1035   CL 100 07/10/2022 1035   CO2 29 07/10/2022 1035   GLUCOSE 87 07/10/2022 1035   GLUCOSE 93 05/30/2022 1235   BUN 12 07/10/2022 1035   CREATININE 0.73 07/10/2022 1035   CREATININE 0.67 05/30/2022 1235   CREATININE 0.76 05/13/2022 0000   CALCIUM 9.9 07/10/2022 1035   PROT 6.5 07/10/2022 1035   ALBUMIN 4.5 07/10/2022 1035   AST 25 07/10/2022 1035   AST 17 05/30/2022 1235   ALT 22 07/10/2022 1035   ALT 12 05/30/2022 1235   ALKPHOS 97 07/10/2022 1035   BILITOT 0.5 07/10/2022 1035   BILITOT 0.7 05/30/2022 1235   GFRNONAA >60 05/30/2022 1235   GFRNONAA 90 08/25/2020 0000   GFRAA 104 08/25/2020 0000  No results found for: "TOTALPROTELP", "ALBUMINELP", "A1GS", "A2GS",  "BETS", "BETA2SER", "GAMS", "MSPIKE", "SPEI"  No results found for: "KPAFRELGTCHN", "LAMBDASER", "KAPLAMBRATIO"  Lab Results  Component Value Date   WBC 6.5 05/30/2022   NEUTROABS 4.3 05/30/2022   HGB 13.7 05/30/2022   HCT 39.3 05/30/2022   MCV 87.1 05/30/2022   PLT 222 05/30/2022    No results found for: "LABCA2"  No components found for: "NWGNFA213"  No results for input(s): "INR" in the last 168 hours.  No results found for: "LABCA2"  No results found for: "YQM578"  No results found for: "CAN125"  No results found for: "CAN153"  No results found for: "CA2729"  No components found for: "HGQUANT"  No results found for: "CEA1", "CEA" / No results found for: "CEA1", "CEA"   No results found for: "AFPTUMOR"  No results found for: "CHROMOGRNA"  No results found for: "HGBA", "HGBA2QUANT", "HGBFQUANT", "HGBSQUAN" (Hemoglobinopathy evaluation)   No results found for: "LDH"  Lab Results  Component Value Date   IRON 46 05/11/2013   TIBC 254 05/11/2013   IRONPCTSAT 18 (L) 05/11/2013   (Iron and TIBC)  Lab Results  Component Value Date   FERRITIN 72 05/11/2013    Urinalysis    Component Value Date/Time   BILIRUBINUR Small 11/09/2018 0914   PROTEINUR Negative 11/09/2018 0914   UROBILINOGEN 0.2 11/09/2018 0914   NITRITE Negative 11/09/2018 0914   LEUKOCYTESUR Negative 11/09/2018 0914    STUDIES:  MM DIAG BREAST TOMO BILATERAL  Result Date: 11/18/2022 CLINICAL DATA:  61 year old female presenting for annual exam. History of right breast cancer status post lumpectomy in 2020. Patient has history of bilateral breast reduction surgery. EXAM: DIGITAL DIAGNOSTIC BILATERAL MAMMOGRAM WITH TOMOSYNTHESIS TECHNIQUE: Bilateral digital diagnostic mammography and breast tomosynthesis was performed. COMPARISON:  Previous exam(s). ACR Breast Density Category c: The breasts are heterogeneously dense, which may obscure small masses. FINDINGS: Right breast: A spot 2D  magnification view of the lumpectomy site was performed in addition to standard views. There are stable postsurgical changes. No suspicious mass, distortion, or microcalcifications are identified to suggest presence of malignancy. Left breast: No suspicious mass, distortion, or microcalcifications are identified to suggest presence of malignancy. IMPRESSION: 1. Stable postsurgical changes in the right breast. 2. No mammographic evidence of malignancy in the left breast. RECOMMENDATION: Diagnostic bilateral mammogram in 1 year. I have discussed the findings and recommendations with the patient. If applicable, a reminder letter will be sent to the patient regarding the next appointment. BI-RADS CATEGORY  2: Benign. Electronically Signed   By: Emmaline Kluver M.D.   On: 11/18/2022 12:16    ELIGIBLE FOR AVAILABLE RESEARCH PROTOCOL: no   ASSESSMENT: 62 y.o. Kathryne Sharper, [place] woman status post right breast upper inner quadrant biopsy 10/28/2018 for a clinical T2 N1, stage IIA invasive ductal carcinoma, grade 2, estrogen receptor positive, progesterone receptor negative, HER-2 amplified, with an MIB-1 of 20%  (a) staging work-up with CT scans of the chest abdomen and pelvis and thyroid ultrasonography shows multiple findings that will require follow-up:   (i) two 0.5 cm liver lesions   (ii) possible bladder mass, thickened sigmoid and rectum, and prominent adnexa   (iii) thyroid nodule biopsied 11/19/2018, read as [place] 3 (risk of malignancy 5-15%)   (iiii)  thyroid lymph node biopsied 11/19/2018 showing no evidence of carcinoma   (iiiii) abd MRI 05/07 2020 confirms two tiny liver lesions. No bladder or adnexal malignancy  (1) neoadjuvant chemotherapy consisting of carboplatin, docetaxel, trastuzumab, and pertuzumab every 21 days  x 6 started 11/17/2018, copleted 03/02/2019  (a) pertuzumab discontinued after cycle 1 due to severe diarrhea  (2) trastuzumab continued to complete 1 year (last dose  10/19/2019).).  (a) baseline echocardiogram 11/11/2018 showed an ejection fraction in the 55-60% range  (b) echocardiogram 03/10/2019 shows EF of 60-65%  (c) echo 06/08/2019 shows EF 60%  (d) echo 09/14/2018 shows an ejection fraction in the 60-65% range.  (e) echo 12/14/2019 shows an ejection fraction in the 60-65% range  (3) Right breast lumpectomy on 04/15/2019: no residual carcinoma (ypT0 ypN0)  (a) a total of 4 lymph nodes were removed  (4) adjuvant radiation 05/31/2019 - 07/14/2019 Site Technique Total Dose (Gy) Dose per Fx (Gy) Completed Fx Beam Energies  Breast: Breast_Rt 3D 50.4/50.4 1.8 28/28 6X, 10X  Breast: Breast_Rt_axilla 3D 45/45 1.8 25/25 6X, 10X   (5) started tamoxifen 08/20/2019  (a) s/p hysterectomy  (b) bone density 07/30/2019 shows a T score of -1.8   (c) used oral contraceptives for 7 or 8 years in the past with no complications  (6) thyroid nodules followed by Dr Darnell Level  (7) very small liver abnormalities noted on CT March 2020 and MRI May 2020 require follow-up   (a) repeat liver MRI 06/06/2020 shows these to be small harmartomas or cysts   PLAN:  Patient is here for follow-up on adjuvant tamoxifen.  No concerns for toxicity. She will continue anti estrogen therapy in Jan 2026. On physical exam today, she has dense breasts but no obvious abnormalities Mammogram with no concerns for malignancy She said many yrs ago, she had an US thyroid and she wondered if I can order a FU [place]. I did. She however was instructed to let me know if no one from radiology calls her. She will RTC in one yr or sooner as needed Total time spent: 30 minutes  *Total Encounter Time as defined by the Centers for Medicare and Medicaid Services includes, in addition to the face-to-face time of a patient visit (documented in the note above) non-face-to-face time: obtaining and reviewing outside history, ordering and reviewing medications, tests or procedures, care coordination  (communications with other health care professionals or caregivers) and documentation in the medical record.

## 2022-12-09 ENCOUNTER — Telehealth: Payer: Self-pay | Admitting: *Deleted

## 2022-12-09 ENCOUNTER — Encounter: Payer: Self-pay | Admitting: Cardiology

## 2022-12-09 ENCOUNTER — Ambulatory Visit: Payer: BC Managed Care – PPO | Attending: Cardiology | Admitting: Cardiology

## 2022-12-09 VITALS — BP 118/70 | HR 73 | Ht 66.0 in | Wt 187.8 lb

## 2022-12-09 DIAGNOSIS — I10 Essential (primary) hypertension: Secondary | ICD-10-CM

## 2022-12-09 DIAGNOSIS — G4733 Obstructive sleep apnea (adult) (pediatric): Secondary | ICD-10-CM

## 2022-12-09 NOTE — Telephone Encounter (Signed)
Per Dr Mayford Knife, please let patient know that she is having < 1 event per hour but needs to improve compliance.  Gave patient recommendation to improve in compliance made by Dr. Mayford Knife and  understanding was verbalized.

## 2022-12-09 NOTE — Progress Notes (Signed)
Sleep Medicine CONSULT Note    Date:  12/09/2022   ID:  Debbie [name], DOB January 28, 1961, MRN [id number]  PCP:  Debbie Longs, PA-C  Cardiologist: None   Chief Complaint  Patient presents with   New Patient (Initial Visit)    Obstructive sleep apnea    History of Present Illness:  Debbie [name] is a 62 y.o. female who is being seen today for the evaluation of OSA at the request of Belva Crome, MD.  This is a 62 year old female with a history of hypertension, GERD, palpitations and coronary artery calcifications.  She also has a history of obstructive sleep apnea and uses CPAP.  She is currently not followed by sleep physician and she is now referred to sleep medicine for consultation to establish sleep care.    She initially had a sleep study done on 11/14/2017 which showed mild obstructive sleep apnea with AHI 12.0/h but had difficulty initiating and maintaining sleep.  Lowest O2 saturation was 81%.  She also had periodic limb movement noted during sleep.  She was started on auto CPAP.  She then had a home sleep study done on 11/01/2021 because she wanted to get off CPAP and this revealed mild sleep apnea with an AHI of 10.9/h.  She remains on CPAP therapy.  She is doing well with her PAP device and thinks that she has gotten used to it.  She tolerates the nasal pillow mask and feels the pressure is adequate.  Since going on PAP she feels rested in the am and has no significant daytime sleepiness although rarely she will take a nap during the day.  She denies any significant mouth or nasal dryness or nasal congestion.  She does not think that he snores.    Past Medical History:  Diagnosis Date   Abnormal CT of the abdomen 11/11/2018   Anxiety    Asthma    with severe URIs   Bilateral hand pain 01/23/2018   Bladder mass 11/13/2018   Breast cancer    Bronchitis    Chest tightness 07/09/2017   Chronic shoulder pain 06/26/2016   Class 1 obesity due to excess calories  with serious comorbidity and body mass index (BMI) of 30.0 to 30.9 in adult 03/05/2013   Dr. Cathey Endow: Qsymia started July 2014, sleep study ordered.   Cough 07/09/2017   Elevated LFTs 05/11/2013   Liver biopsy normal spring 2015   Enlarged lymph nodes 11/13/2018   Essential hypertension, benign 12/01/2012   Family history of colon cancer    Family history of stroke 04/29/2018   Fungal infection of toenail 01/17/2017   Gastritis 02/17/2013   GERD (gastroesophageal reflux disease)    Hand joint stiff, unspecified laterality 01/23/2018   History of hysterectomy 01/04/2013   History of shingles 01/04/2013   Age 7   Hypotension 04/29/2018   Irritable bowel syndrome (IBS)    "had for years," per patient   Labral tear of shoulder 08/29/2014   Large breasts 06/26/2016   Liver lesion 11/13/2018   Liver nodule 11/11/2018   Malignant neoplasm of upper-inner quadrant of right breast in female, 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 IgG labs 2019.   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   No energy 10/20/2017   Nocturnal hypoxemia 11/23/2017   Non-restorative sleep 10/20/2017   OSA (obstructive sleep apnea) 11/23/2017   Periodic limb movement 11/23/2017  Personal history of chemotherapy    Personal history of radiation therapy    PONV (postoperative nausea and vomiting)    Port-A-Cath in place 12/29/2018   Positive ANA (antinuclear antibody) 02/06/2018   Primary insomnia 06/26/2016   Primary osteoarthritis of both feet 04/29/2018   Primary osteoarthritis of both hands 04/29/2018   RBBB 05/13/2022   Reactive airway disease, mild intermittent, uncomplicated 10/21/2017   Rectocele 10/20/2017   Seasonal allergies    Sigmoid thickening 11/13/2018   Snoring 10/20/2017   SOB (shortness of breath) 07/09/2017   Stress 08/28/2020   Tachycardia 08/28/2020   Thyroid nodule  11/13/2018    Past Surgical History:  Procedure Laterality Date   ANKLE SURGERY     BREAST CYST EXCISION Bilateral    BREAST EXCISIONAL BIOPSY Bilateral    BREAST LUMPECTOMY Right 03/2019   BREAST LUMPECTOMY Left    benign. done around age 41   BREAST LUMPECTOMY WITH RADIOACTIVE SEED AND AXILLARY LYMPH NODE DISSECTION Right 04/15/2019   Procedure: RIGHT BREAST LUMPECTOMY WITH RADIOACTIVE SEED AND  TARGETED RIGHT AXILLARY LYMPH NODE DISSECTION WITH RADIOACTIVE SEED;  Surgeon: Ovidio Kin, MD;  Location: [facility];  Service: General;  Laterality: Right;   CHOLECYSTECTOMY  2012   KNEE ARTHROSCOPY W/ MEDIAL COLLATERAL LIGAMENT (MCL) REPAIR Left 10/07/2012   PORTACATH PLACEMENT N/A 11/16/2018   Procedure: INSERTION PORT-A-CATH WITH ULTRASOUND;  Surgeon: Ovidio Kin, MD;  Location: [facility];  Service: General;  Laterality: N/A;   REDUCTION MAMMAPLASTY Bilateral 11/17/2020   TONSILLECTOMY  1969   TOTAL ABDOMINAL HYSTERECTOMY  2011   with vaginal sling    Current Medications: Current Meds  Medication Sig   albuterol (PROAIR HFA) 108 (90 Base) MCG/ACT inhaler Inhale 2 puffs into the lungs every 4 (four) hours as needed for wheezing or shortness of breath.   AMBULATORY NON FORMULARY MEDICATION Continuous positive airway pressure (CPAP) machine set at autopap, with all supplemental supplies as needed.   calcium-vitamin D (OSCAL WITH D) 500-200 MG-UNIT tablet Take 1 tablet by mouth daily with breakfast.   losartan (COZAAR) 100 MG tablet TAKE 1 TABLET DAILY   metoprolol tartrate (LOPRESSOR) 25 MG tablet Take one tablet as needed for palpitations or increased heart rate. Ok to take up to twice a day.   Multiple Vitamin (MULTIVITAMIN WITH MINERALS) TABS tablet Take 1 tablet by mouth daily.   tamoxifen (NOLVADEX) 20 MG tablet Take 1 tablet (20 mg total) by mouth daily.   triamcinolone cream (KENALOG) 0.1 % Apply 1 Application topically 2 (two) times daily.   valACYclovir (VALTREX)  1000 MG tablet TAKE 1 TABLET TWICE A DAY AS NEEDED FOR COLD SORE   venlafaxine XR (EFFEXOR-XR) 75 MG 24 hr capsule Take 1 capsule (75 mg total) by mouth daily with breakfast.    Allergies:   Erythromycin, Penicillins, Azithromycin, Erythromycin base, Levofloxacin, and Penicillin g   Social History   Socioeconomic History   Marital status: Married    Spouse name: Harvie Heck   Number of children: 2   Years of education: Not on file   Highest education level: Not on file  Occupational History   Not on file  Tobacco Use   Smoking status: Never   Smokeless tobacco: Never  Vaping Use   Vaping Use: Never used  Substance and Sexual Activity   Alcohol use: Yes    Comment: rare   Drug use: No   Sexual activity: Not Currently  Other Topics Concern   Not on file  Social History  Narrative   Not on file   Social Determinants of Health   Financial Resource Strain: Not on file  Food Insecurity: Not on file  Transportation Needs: Not on file  Physical Activity: Not on file  Stress: Not on file  Social Connections: Not on file     Family History:  The patient's family history includes Alcoholism in her father; Atrial fibrillation in her brother; Colon cancer (age of onset: 32) in her father; Depression in her father; Diabetes in her father; Healthy in her daughter and daughter; Heart failure in her mother; Hyperlipidemia in her maternal grandfather; Hypertension in her brother, father, and mother; Lung cancer in her father; Rheum arthritis in her mother; Skin cancer in her father and paternal grandmother; Stroke in her brother and paternal grandmother.   ROS:   Please see the history of present illness.    ROS All other systems reviewed and are negative.      No data to display             PHYSICAL EXAM:   VS:  BP 118/70   Pulse 73   Ht 5\' 6"  (1.676 m)   Wt 187 lb 12.8 oz (85.2 kg)   SpO2 95%   BMI 30.31 kg/m    GEN: Well nourished, well developed, in no acute distress   HEENT: small posterior ooropharynx Neck: no JVD, carotid bruits, or masses Cardiac: RRR; no murmurs, rubs, or gallops,no edema.  Intact distal pulses bilaterally.  Respiratory:  clear to auscultation bilaterally, normal work of breathing GI: soft, nontender, nondistended, + BS MS: no deformity or atrophy  Skin: warm and dry, no rash Neuro:  Alert and Oriented x 3, Strength and sensation are intact Psych: euthymic mood, full affect  Wt Readings from Last 3 Encounters:  12/09/22 187 lb 12.8 oz (85.2 kg)  11/29/22 187 lb 9.6 oz (85.1 kg)  10/21/22 184 lb (83.5 kg)      Studies/Labs Reviewed:   Home sleep study and PAP compliance download  Recent Labs: 05/13/2022: TSH 2.14 05/30/2022: Hemoglobin 13.7; Platelet Count 222 07/10/2022: ALT 22; BUN 12; Creatinine, Ser 0.73; Potassium 3.2; Sodium 143   Additional studies/ records that were reviewed today include:  none    ASSESSMENT:    1. OSA (obstructive sleep apnea)   2. Essential hypertension, benign      PLAN:  In order of problems listed above:  OSA - The patient is tolerating PAP therapy well without any problems. The patient has been using and benefiting from PAP use and will continue to benefit from therapy.  -I will get a download from her DME and I will call her with results -if AHI is good on download then will followup with me in 1 year  2.  Hypertension -BP controlled on exam today -Continue prescription drug management with losartan 100 mg daily with as needed refills   Time Spent: 20 minutes total time of encounter, including 15 minutes spent in face-to-face patient care on the date of this encounter. This time includes coordination of care and counseling regarding above mentioned problem list. Remainder of non-face-to-face time involved reviewing chart documents/testing relevant to the patient encounter and documentation in the medical record. I have independently reviewed documentation from referring  provider  Medication Adjustments/Labs and Tests Ordered: Current medicines are reviewed at length with the patient today.  Concerns regarding medicines are outlined above.  Medication changes, Labs and Tests ordered today are listed in the Patient Instructions below.  There are  no Patient Instructions on file for this visit.   Signed, Armanda Magic, MD  12/09/2022 1:21 PM    [facility] [address] [place], [place], [place]  [address] Phone: [phone]; Fax: [phone]

## 2022-12-09 NOTE — Patient Instructions (Signed)
Medication Instructions:  Your physician recommends that you continue on your current medications as directed. Please refer to the Current Medication list given to you today.  *If you need a refill on your cardiac medications before your next appointment, please call your pharmacy*   Lab Work: None.  If you have labs (blood work) drawn today and your tests are completely normal, you will receive your results only by: MyChart Message (if you have MyChart) OR A paper copy in the mail If you have any lab test that is abnormal or we need to change your treatment, we will call you to review the results.   Testing/Procedures: None.   Follow-Up:  Your next appointment:   1 year(s)  Provider:   Dr. Traci Turner, MD   

## 2022-12-11 DIAGNOSIS — H02403 Unspecified ptosis of bilateral eyelids: Secondary | ICD-10-CM | POA: Diagnosis not present

## 2022-12-11 DIAGNOSIS — H02401 Unspecified ptosis of right eyelid: Secondary | ICD-10-CM | POA: Diagnosis not present

## 2022-12-11 DIAGNOSIS — H04552 Acquired stenosis of left nasolacrimal duct: Secondary | ICD-10-CM | POA: Diagnosis not present

## 2022-12-11 DIAGNOSIS — H02402 Unspecified ptosis of left eyelid: Secondary | ICD-10-CM | POA: Diagnosis not present

## 2022-12-18 ENCOUNTER — Ambulatory Visit (INDEPENDENT_AMBULATORY_CARE_PROVIDER_SITE_OTHER): Payer: BC Managed Care – PPO

## 2022-12-18 DIAGNOSIS — Z780 Asymptomatic menopausal state: Secondary | ICD-10-CM

## 2022-12-18 DIAGNOSIS — Z13820 Encounter for screening for osteoporosis: Secondary | ICD-10-CM

## 2022-12-18 DIAGNOSIS — M8589 Other specified disorders of bone density and structure, multiple sites: Secondary | ICD-10-CM | POA: Diagnosis not present

## 2022-12-20 ENCOUNTER — Encounter: Payer: Self-pay | Admitting: Physician Assistant

## 2022-12-20 DIAGNOSIS — M8580 Other specified disorders of bone density and structure, unspecified site: Secondary | ICD-10-CM

## 2022-12-20 HISTORY — DX: Other specified disorders of bone density and structure, unspecified site: M85.80

## 2022-12-20 NOTE — Progress Notes (Signed)
Osteopenia, low bone mass, make sure taking vitamin D and calcium daily. Recheck in 2-3 years.

## 2023-01-09 DIAGNOSIS — Z713 Dietary counseling and surveillance: Secondary | ICD-10-CM | POA: Diagnosis not present

## 2023-01-15 ENCOUNTER — Ambulatory Visit
Admission: RE | Admit: 2023-01-15 | Discharge: 2023-01-15 | Disposition: A | Discharge status: Home / Self Care | Payer: BC Managed Care – PPO | Source: Ambulatory Visit | Attending: Hematology and Oncology | Admitting: Hematology and Oncology

## 2023-01-15 DIAGNOSIS — C50211 Malignant neoplasm of upper-inner quadrant of right female breast: Secondary | ICD-10-CM

## 2023-01-15 DIAGNOSIS — E042 Nontoxic multinodular goiter: Secondary | ICD-10-CM | POA: Diagnosis not present

## 2023-02-21 ENCOUNTER — Ambulatory Visit: Payer: BC Managed Care – PPO | Admitting: Physician Assistant

## 2023-02-27 ENCOUNTER — Ambulatory Visit: Payer: BC Managed Care – PPO | Admitting: Physician Assistant

## 2023-02-27 VITALS — BP 126/58 | HR 68 | Ht 66.0 in | Wt 189.0 lb

## 2023-02-27 DIAGNOSIS — I10 Essential (primary) hypertension: Secondary | ICD-10-CM | POA: Diagnosis not present

## 2023-02-27 DIAGNOSIS — G4733 Obstructive sleep apnea (adult) (pediatric): Secondary | ICD-10-CM | POA: Diagnosis not present

## 2023-02-27 DIAGNOSIS — E6609 Other obesity due to excess calories: Secondary | ICD-10-CM | POA: Diagnosis not present

## 2023-02-27 DIAGNOSIS — F439 Reaction to severe stress, unspecified: Secondary | ICD-10-CM

## 2023-02-27 DIAGNOSIS — E66811 Other obesity due to excess calories: Secondary | ICD-10-CM

## 2023-02-27 DIAGNOSIS — Z6830 Body mass index (BMI) 30.0-30.9, adult: Secondary | ICD-10-CM

## 2023-02-27 DIAGNOSIS — Z79899 Other long term (current) drug therapy: Secondary | ICD-10-CM

## 2023-02-27 DIAGNOSIS — Z131 Encounter for screening for diabetes mellitus: Secondary | ICD-10-CM

## 2023-02-27 DIAGNOSIS — Z13220 Encounter for screening for lipoid disorders: Secondary | ICD-10-CM

## 2023-02-27 DIAGNOSIS — C50211 Malignant neoplasm of upper-inner quadrant of right female breast: Secondary | ICD-10-CM

## 2023-02-27 DIAGNOSIS — Z170 Estrogen receptor positive status [ER+]: Secondary | ICD-10-CM

## 2023-02-27 HISTORY — DX: Other obesity due to excess calories: E66.811

## 2023-02-27 HISTORY — DX: Other obesity due to excess calories: E66.09

## 2023-02-27 MED ORDER — ZEPBOUND 2.5 MG/0.5ML ~~LOC~~ SOAJ
2.5000 mg | SUBCUTANEOUS | 0 refills | Status: DC
Start: 2023-02-27 — End: 2023-05-13

## 2023-02-27 NOTE — Progress Notes (Signed)
Established Patient Office Visit  Subjective   Patient ID: Debbie [name], female    DOB: 05-May-1961  Age: 62 y.o. MRN: [id number]  Chief Complaint  Patient presents with   Follow-up    HPI Pt is a 62 yo obese female with BC in remission and followed by oncology with HTN and stress reaction who needs refills today.   She is doing great with mood on effexor. No concerns.   She denies any CP, palpitations, headaches or vision changes. Taking losartan. Only had to use metoprolol twice for increase heart rate.   She did have cholesterol checked at work and they told her it was elevated. Her last check here it was not and she wants it rechecked.   She is frustrated with weight gain. In 2019 she lost 125lbs. She used saxenda. She has gained 25lbs of it back. She would like to try another injection to help. She is active and doing kick boxing and walking. She trys to keep diet clean but she reports a very strong appetite.   Using CPAP nightly.  .. Active Ambulatory Problems    Diagnosis Date Noted   Essential hypertension, benign 12/01/2012   Family history of colon cancer 12/01/2012   History of hysterectomy 01/04/2013   History of shingles 01/04/2013   Gastritis 02/17/2013   Class 1 obesity due to excess calories with serious comorbidity and body mass index (BMI) of 30.0 to 30.9 in adult 03/05/2013   Elevated LFTs 05/11/2013   Labral tear of shoulder 08/29/2014   Large breasts 06/26/2016   Primary insomnia 06/26/2016   Mid back pain 06/26/2016   Chronic shoulder pain 06/26/2016   Fungal infection of toenail 01/17/2017   Chest tightness 07/09/2017   SOB (shortness of breath) 07/09/2017   Cough 07/09/2017   Snoring 10/20/2017   Rectocele 10/20/2017   Non-restorative sleep 10/20/2017   No energy 10/20/2017   Reactive airway disease, mild intermittent, uncomplicated 10/21/2017   Periodic limb movement 11/23/2017   OSA (obstructive sleep apnea) 11/23/2017   Nocturnal  hypoxemia 11/23/2017   Bilateral hand pain 01/23/2018   Hand joint stiff, unspecified laterality 01/23/2018   Positive ANA (antinuclear antibody) 02/06/2018   Hypotension 04/29/2018   Family history of stroke 04/29/2018   Primary osteoarthritis of both hands 04/29/2018   Primary osteoarthritis of both feet 04/29/2018   Multiple food allergies 07/20/2018   Malignant neoplasm of upper-inner quadrant of right breast in female, estrogen receptor positive (HCC) 11/02/2018   Liver nodule 11/11/2018   Abnormal CT of the abdomen 11/11/2018   Liver lesion 11/13/2018   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   Sigmoid thickening 11/13/2018   Bladder mass 11/13/2018   Thyroid nodule 11/13/2018   Enlarged lymph nodes 11/13/2018   Port-A-Cath in place 12/29/2018   Stress 08/28/2020   Tachycardia 08/28/2020   RBBB 05/13/2022   Anxiety 07/09/2022   Asthma 07/09/2022   Breast cancer (HCC) 07/09/2022   Bronchitis 07/09/2022   Personal history of chemotherapy 12/01/2012   Personal history of radiation therapy 07/09/2022   PONV (postoperative nausea and vomiting) 07/09/2022   Seasonal allergies 07/09/2022   GERD (gastroesophageal reflux disease) 07/09/2022   Irritable bowel syndrome (IBS) 07/09/2022   Coronary artery calcification 07/10/2022   Palpitations 07/10/2022   Osteopenia 12/20/2022   Class 1 obesity due to excess calories without serious comorbidity with body mass index (BMI) of 30.0 to 30.9 in adult 02/27/2023   Resolved Ambulatory Problems    Diagnosis Date  Noted   Generalized anxiety disorder 12/01/2012   No Additional Past Medical History     ROS See HPI.    Objective:     BP (!) 126/58   Pulse 68   Ht 5\' 6"  (1.676 m)   Wt 189 lb (85.7 kg)   SpO2 99%   BMI 30.51 kg/m  BP Readings from Last 3 Encounters:  02/27/23 (!) 126/58  12/09/22 118/70  11/29/22 (!) 141/71   Wt Readings from Last 3 Encounters:  02/27/23 189 lb (85.7 kg)  12/09/22 187  lb 12.8 oz (85.2 kg)  11/29/22 187 lb 9.6 oz (85.1 kg)    ..    10/21/2022    2:19 PM 08/23/2022    9:55 AM 05/13/2022    9:11 AM 08/08/2021   11:01 AM 11/28/2017    6:01 PM  Depression screen PHQ 2/9  Decreased Interest 0 0 0 0 2  Down, Depressed, Hopeless 0 0 0 0 0  PHQ - 2 Score 0 0 0 0 2  Altered sleeping   1 1 0  Tired, decreased energy   1 1 2   Change in appetite   3 1 3   Feeling bad or failure about yourself    0 0 0  Trouble concentrating   0 0 0  Moving slowly or fidgety/restless   0 0 0  Suicidal thoughts   0 0 0  PHQ-9 Score   5 3 7   Difficult doing work/chores   Not difficult at all Not difficult at all Somewhat difficult      Physical Exam Constitutional:      Appearance: Normal appearance. She is obese.  HENT:     Head: Normocephalic.  Cardiovascular:     Rate and Rhythm: Normal rate and regular rhythm.     Pulses: Normal pulses.     Heart sounds: Murmur heard.  Pulmonary:     Effort: Pulmonary effort is normal.     Breath sounds: Normal breath sounds.  Abdominal:     Palpations: Abdomen is soft.  Musculoskeletal:     Cervical back: Normal range of motion and neck supple. No tenderness.     Right lower leg: No edema.     Left lower leg: No edema.  Lymphadenopathy:     Cervical: No cervical adenopathy.  Neurological:     General: No focal deficit present.     Mental Status: She is alert and oriented to person, place, and time.  Psychiatric:        Mood and Affect: Mood normal.       Assessment & Plan:  Marland KitchenMarland KitchenPeggie was seen today for follow-up.  Diagnoses and all orders for this visit:  Essential hypertension, benign -     COMPLETE METABOLIC PANEL WITH GFR -     tirzepatide (ZEPBOUND) 2.5 MG/0.5ML Pen; Inject 2.5 mg into the skin once a week.  OSA (obstructive sleep apnea) -     tirzepatide (ZEPBOUND) 2.5 MG/0.5ML Pen; Inject 2.5 mg into the skin once a week.  Stress  Class 1 obesity due to excess calories without serious comorbidity with body  mass index (BMI) of 30.0 to 30.9 in adult -     TSH -     tirzepatide (ZEPBOUND) 2.5 MG/0.5ML Pen; Inject 2.5 mg into the skin once a week.  Screening for lipid disorders -     Lipid Panel w/reflex Direct LDL  Screening for diabetes mellitus -     COMPLETE METABOLIC PANEL WITH GFR -  CBC w/Diff/Platelet  Medication management -     COMPLETE METABOLIC PANEL WITH GFR  Malignant neoplasm of upper-inner quadrant of right breast in female, estrogen receptor positive (HCC)   BP looks good Continue on same medications LDL elevated at work will recheck lipid in office, come back when fasting  .Marland KitchenDiscussed low carb diet with 1500 calories and 80g of protein.  Exercising at least 150 minutes a week.  [employer] could be a Chief Technology Officer.  TSH/CMP ordered Discussed options Agreed to zepbound, discussed side effects and titration up Follow upin 3 months  Continue effexor for mood.   Breast cancer in remission and managed by oncology   Tandy Gaw, PA-C

## 2023-02-27 NOTE — Patient Instructions (Signed)
Tirzepatide Injection (Weight Management) What is this medication? TIRZEPATIDE (tir ZEP a tide) promotes weight loss. It may also be used to maintain weight loss.  It works by decreasing appetite. Changes to diet and exercise are often combined with this medication. This medicine may be used for other purposes; ask your health care provider or pharmacist if you have questions. COMMON BRAND NAME(S): Zepbound What should I tell my care team before I take this medication? They need to know if you have any of these conditions: Eye disease caused by diabetes Gallbladder disease History of depression Pancreatic disease Kidney disease Stomach or intestine problems, such as problems digesting food Suicidal thoughts, plans, or attempt by you or a family member Personal or family history of MEN 2, a condition that causes endocrine gland tumors Personal or family history of thyroid cancer An unusual or allergic reaction to tirzepatide, other medications, foods, dyes, or preservatives Pregnant or trying to get pregnant Breastfeeding How should I use this medication? This medication is injected under the skin. You will be taught how to prepare and give it. Take it as directed on the prescription label. Keep taking it unless your care team tells you to stop. It is important that you put your used needles and syringes in a special sharps container. Do not put them in a trash can. If you do not have a sharps container, call your pharmacist or care team to get one. A special MedGuide will be given to you by the pharmacist with each prescription and refill. Be sure to read this information carefully each time. This medication comes with INSTRUCTIONS FOR USE. Ask your pharmacist for directions on how to use this medication. Read the information carefully. Talk to your pharmacist or care team if you have questions. Talk to your care team about the use of this medication in children. Special care may be  needed. Overdosage: If you think you have taken too much of this medicine contact a poison control center or emergency room at once. NOTE: This medicine is only for you. Do not share this medicine with others. What if I miss a dose? If you miss a dose, take it as soon as you can unless it is more than 4 days (96 hours) late. If it is more than 4 days late, skip the missed dose. Take the next dose at the normal time. Do not take 2 doses within 3 days (72 hours) of each other. What may interact with this medication? Certain medications for diabetes, such as insulin, glyburide, glipizide This medication may affect how other medications work. Talk with your care team about all of the medications you take. They may suggest changes to your treatment plan to lower the risk of side effects and to make sure your medications work as intended. This list may not describe all possible interactions. Give your health care provider a list of all the medicines, herbs, non-prescription drugs, or dietary supplements you use. Also tell them if you smoke, drink alcohol, or use illegal drugs. Some items may interact with your medicine. What should I watch for while using this medication? Visit your care team for regular checks on your progress. It may be some time before you see the benefit from this medication. Check with your care team if you have severe diarrhea, nausea, and vomiting, or if you sweat a lot. The loss of too much body fluid may make it dangerous for you to take this medication. Tell your care team if you are  taking medications to treat diabetes, such as insulin or sulfonylureas. This may increase your risk of low blood sugar. Know the symptoms of low blood sugar and how to treat it. Talk to your care team about your risk of cancer. You may be more at risk for certain types of cancer if you take this medication. Estrogen and progestin hormones may not work as well while you are taking this medication. If  you take these as pills by mouth, your care team may recommend another type of contraception for 4 weeks after you start this medication and for 4 weeks after each dose increase. Talk to your care team about contraceptive options. They can help you find the option that works for you. What side effects may I notice from receiving this medication? Side effects that you should report to your care team as soon as possible: Allergic reactions or angioedema--skin rash, itching or hives, swelling of the face, eyes, lips, tongue, arms, or legs, trouble swallowing or breathing Bowel blockage--stomach cramping, unable to have a bowel movement or pass gas, loss of appetite, vomiting Change in vision Dehydration--increased thirst, dry mouth, feeling faint or lightheaded, headache, dark yellow or brown urine Gallbladder problems--severe stomach pain, nausea, vomiting, fever Kidney injury--decrease in the amount of urine, swelling of the ankles, hands, or feet Pancreatitis--severe stomach pain that spreads to your back or gets worse after eating or when touched, fever, nausea, vomiting Thoughts of suicide or self-harm, worsening mood, feelings of depression Thyroid cancer--new mass or lump in the neck, pain or trouble swallowing, trouble breathing, hoarseness Side effects that usually do not require medical attention (report these to your care team if they continue or are bothersome): Diarrhea Loss of appetite Nausea Upset stomach This list may not describe all possible side effects. Call your doctor for medical advice about side effects. You may report side effects to FDA at 1-800-FDA-1088. Where should I keep my medication? Keep out of the reach of children and pets. Store in a refrigerator or at room temperature up to 30 degrees C (86 degrees F). Keep it in the original container. Protect from light. Refrigeration (preferred): Store in the refrigerator. Do not freeze. Get rid of any unused medication after  the expiration date. Room temperature: This medication may be stored at room temperature for up to 21 days. If it is stored at room temperature, get rid of any unused medication after 21 days or after it expires, whichever is first. To get rid of medications that are no longer needed or have expired: Take the medication to a medication take-back program. Check with your pharmacy or law enforcement to find a location. If you cannot return the medication, ask your pharmacist or care team how to get rid of this medication safely. NOTE: This sheet is a summary. It may not cover all possible information. If you have questions about this medicine, talk to your doctor, pharmacist, or health care provider.  2024 Elsevier/Gold Standard (2022-10-13 00:00:00)

## 2023-03-04 DIAGNOSIS — Z713 Dietary counseling and surveillance: Secondary | ICD-10-CM | POA: Diagnosis not present

## 2023-03-07 ENCOUNTER — Encounter: Payer: Self-pay | Admitting: Physician Assistant

## 2023-03-07 ENCOUNTER — Telehealth: Payer: Self-pay

## 2023-03-07 NOTE — Telephone Encounter (Signed)
Initiated Prior authorization ZOX:[id number] 2.5mg  Via: Covermymeds Case/Key:[id number] Status: Pending as of 03/07/23 Reason: Notified Pt via: Mychart

## 2023-03-17 ENCOUNTER — Encounter: Payer: Self-pay | Admitting: Physician Assistant

## 2023-03-17 DIAGNOSIS — M2041 Other hammer toe(s) (acquired), right foot: Secondary | ICD-10-CM

## 2023-03-17 DIAGNOSIS — M79671 Pain in right foot: Secondary | ICD-10-CM

## 2023-03-18 DIAGNOSIS — M2041 Other hammer toe(s) (acquired), right foot: Secondary | ICD-10-CM | POA: Insufficient documentation

## 2023-03-18 DIAGNOSIS — M79671 Pain in right foot: Secondary | ICD-10-CM | POA: Insufficient documentation

## 2023-03-18 HISTORY — DX: Other hammer toe(s) (acquired), right foot: M20.41

## 2023-03-18 HISTORY — DX: Pain in right foot: M79.671

## 2023-03-20 ENCOUNTER — Encounter: Payer: Self-pay | Admitting: Oncology

## 2023-03-28 ENCOUNTER — Encounter: Payer: Self-pay | Admitting: Physician Assistant

## 2023-04-03 ENCOUNTER — Ambulatory Visit: Payer: BC Managed Care – PPO | Admitting: Podiatry

## 2023-04-03 ENCOUNTER — Ambulatory Visit (INDEPENDENT_AMBULATORY_CARE_PROVIDER_SITE_OTHER): Payer: BC Managed Care – PPO

## 2023-04-03 ENCOUNTER — Encounter: Payer: Self-pay | Admitting: Podiatry

## 2023-04-03 ENCOUNTER — Other Ambulatory Visit: Payer: Self-pay | Admitting: Podiatry

## 2023-04-03 DIAGNOSIS — M19071 Primary osteoarthritis, right ankle and foot: Secondary | ICD-10-CM | POA: Diagnosis not present

## 2023-04-03 DIAGNOSIS — M79671 Pain in right foot: Secondary | ICD-10-CM

## 2023-04-03 DIAGNOSIS — M2041 Other hammer toe(s) (acquired), right foot: Secondary | ICD-10-CM | POA: Diagnosis not present

## 2023-04-03 NOTE — Progress Notes (Signed)
Subjective:  Patient ID: Debbie [name], female    DOB: February 06, 1961,   MRN: [id number]  Chief Complaint  Patient presents with   Hammer Toe    Painful bil hammer toe worse on the right. She had them for a long time, just starting hurting about a month ago.    62 y.o. female presents for concern as above. Relates she was wearing On clouds and that's what started the pain. Has done well in [place] in the past.  . Denies any other pedal complaints. Denies n/v/f/c.   Past Medical History:  Diagnosis Date   Abnormal CT of the abdomen 11/11/2018   Anxiety    Asthma    with severe URIs   Bilateral hand pain 01/23/2018   Bladder mass 11/13/2018   Breast cancer (HCC)    Bronchitis    Chest tightness 07/09/2017   Chronic shoulder pain 06/26/2016   Class 1 obesity due to excess calories with serious comorbidity and body mass index (BMI) of 30.0 to 30.9 in adult 03/05/2013   Dr. Cathey Endow: Qsymia started July 2014, sleep study ordered.   Cough 07/09/2017   Elevated LFTs 05/11/2013   Liver biopsy normal spring 2015   Enlarged lymph nodes 11/13/2018   Essential hypertension, benign 12/01/2012   Family history of colon cancer    Family history of stroke 04/29/2018   Fungal infection of toenail 01/17/2017   Gastritis 02/17/2013   GERD (gastroesophageal reflux disease)    Hand joint stiff, unspecified laterality 01/23/2018   History of hysterectomy 01/04/2013   History of shingles 01/04/2013   Age 63   Hypotension 04/29/2018   Irritable bowel syndrome (IBS)    "had for years," per patient   Labral tear of shoulder 08/29/2014   Large breasts 06/26/2016   Liver lesion 11/13/2018   Liver nodule 11/11/2018   Malignant neoplasm of upper-inner quadrant of right breast in female, 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 IgG labs 2019.   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    No energy 10/20/2017   Nocturnal hypoxemia 11/23/2017   Non-restorative sleep 10/20/2017   OSA (obstructive sleep apnea) 11/23/2017   Periodic limb movement 11/23/2017   Personal history of chemotherapy    Personal history of radiation therapy    PONV (postoperative nausea and vomiting)    Port-A-Cath in place 12/29/2018   Positive ANA (antinuclear antibody) 02/06/2018   Primary insomnia 06/26/2016   Primary osteoarthritis of both feet 04/29/2018   Primary osteoarthritis of both hands 04/29/2018   RBBB 05/13/2022   Reactive airway disease, mild intermittent, uncomplicated 10/21/2017   Rectocele 10/20/2017   Seasonal allergies    Sigmoid thickening 11/13/2018   Snoring 10/20/2017   SOB (shortness of breath) 07/09/2017   Stress 08/28/2020   Tachycardia 08/28/2020   Thyroid nodule 11/13/2018    Objective:  Physical Exam: Vascular: DP/PT pulses 2/4 bilateral. CFT <3 seconds. Normal hair growth on digits. No edema.  Skin. No lacerations or abrasions bilateral feet.  Musculoskeletal: MMT 5/5 bilateral lower extremities in DF, PF, Inversion and Eversion. Deceased ROM in DF of ankle joint. Hammered digits bilateral 2-4. Worse on the right. Hyperkeratosis noted to dorsum of second and fourth digits. Tender to palpation  Neurological: Sensation intact to light touch.   Assessment:   1. Hammertoe of right foot      Plan:  Patient was evaluated and treated and all questions answered. -X-rays  reviewed. Hammered digits 2-4 noted on right foot.  -Educated on hammertoes and treatment options  -Debrided hyperkeratotic tissue as courtesy.  -Discussed padding including toe caps and crest pads.  -Discussed need for potential surgery if pain does not improved.  -Patient to follow-up as needed. Discussed calling if any changes or increased pain.    Louann Sjogren, DPM

## 2023-04-07 ENCOUNTER — Other Ambulatory Visit: Payer: Self-pay | Admitting: Physician Assistant

## 2023-04-07 ENCOUNTER — Encounter: Payer: Self-pay | Admitting: Physician Assistant

## 2023-04-07 DIAGNOSIS — I10 Essential (primary) hypertension: Secondary | ICD-10-CM

## 2023-04-07 DIAGNOSIS — G4733 Obstructive sleep apnea (adult) (pediatric): Secondary | ICD-10-CM

## 2023-04-07 DIAGNOSIS — E6609 Other obesity due to excess calories: Secondary | ICD-10-CM

## 2023-04-07 MED ORDER — ZEPBOUND 5 MG/0.5ML ~~LOC~~ SOAJ: 5.0000 mg | SUBCUTANEOUS | 0 refills | Status: DC

## 2023-04-08 ENCOUNTER — Other Ambulatory Visit: Payer: Self-pay | Admitting: Physician Assistant

## 2023-04-08 DIAGNOSIS — R000 Tachycardia, unspecified: Secondary | ICD-10-CM

## 2023-04-08 MED ORDER — METOPROLOL TARTRATE 25 MG PO TABS
ORAL_TABLET | ORAL | 3 refills | Status: AC
Start: 2023-04-08 — End: ?

## 2023-04-08 NOTE — Progress Notes (Signed)
Pt due for refill.  

## 2023-04-22 ENCOUNTER — Encounter: Payer: Self-pay | Admitting: Physician Assistant

## 2023-04-22 DIAGNOSIS — R110 Nausea: Secondary | ICD-10-CM

## 2023-04-24 MED ORDER — ONDANSETRON 8 MG PO TBDP: 8.0000 mg | ORAL_TABLET | Freq: Three times a day (TID) | ORAL | 1 refills | Status: DC | PRN

## 2023-04-24 NOTE — Addendum Note (Signed)
Addended by: Chalmers Cater on: 04/24/2023 03:30 PM   Modules accepted: Orders

## 2023-04-25 DIAGNOSIS — R110 Nausea: Secondary | ICD-10-CM | POA: Diagnosis not present

## 2023-04-26 LAB — LIPASE: Lipase: 33 U/L (ref 14–72)

## 2023-04-28 DIAGNOSIS — I10 Essential (primary) hypertension: Secondary | ICD-10-CM | POA: Diagnosis not present

## 2023-04-28 DIAGNOSIS — J45909 Unspecified asthma, uncomplicated: Secondary | ICD-10-CM | POA: Diagnosis not present

## 2023-04-28 DIAGNOSIS — R1084 Generalized abdominal pain: Secondary | ICD-10-CM | POA: Diagnosis not present

## 2023-04-28 DIAGNOSIS — R112 Nausea with vomiting, unspecified: Secondary | ICD-10-CM | POA: Diagnosis not present

## 2023-04-28 DIAGNOSIS — I959 Hypotension, unspecified: Secondary | ICD-10-CM | POA: Diagnosis not present

## 2023-04-28 DIAGNOSIS — K580 Irritable bowel syndrome with diarrhea: Secondary | ICD-10-CM | POA: Diagnosis not present

## 2023-04-28 DIAGNOSIS — K219 Gastro-esophageal reflux disease without esophagitis: Secondary | ICD-10-CM | POA: Diagnosis not present

## 2023-04-28 DIAGNOSIS — R140 Abdominal distension (gaseous): Secondary | ICD-10-CM | POA: Diagnosis not present

## 2023-04-28 DIAGNOSIS — E876 Hypokalemia: Secondary | ICD-10-CM | POA: Diagnosis not present

## 2023-04-28 DIAGNOSIS — G4733 Obstructive sleep apnea (adult) (pediatric): Secondary | ICD-10-CM | POA: Diagnosis not present

## 2023-04-28 DIAGNOSIS — R197 Diarrhea, unspecified: Secondary | ICD-10-CM | POA: Diagnosis not present

## 2023-04-28 DIAGNOSIS — Z923 Personal history of irradiation: Secondary | ICD-10-CM | POA: Diagnosis not present

## 2023-04-28 DIAGNOSIS — Z9221 Personal history of antineoplastic chemotherapy: Secondary | ICD-10-CM | POA: Diagnosis not present

## 2023-04-28 DIAGNOSIS — R9431 Abnormal electrocardiogram [ECG] [EKG]: Secondary | ICD-10-CM | POA: Diagnosis not present

## 2023-04-28 DIAGNOSIS — K529 Noninfective gastroenteritis and colitis, unspecified: Secondary | ICD-10-CM | POA: Diagnosis not present

## 2023-04-28 DIAGNOSIS — E860 Dehydration: Secondary | ICD-10-CM | POA: Diagnosis not present

## 2023-04-28 DIAGNOSIS — F419 Anxiety disorder, unspecified: Secondary | ICD-10-CM | POA: Diagnosis not present

## 2023-04-28 DIAGNOSIS — Z853 Personal history of malignant neoplasm of breast: Secondary | ICD-10-CM | POA: Diagnosis not present

## 2023-04-28 NOTE — Progress Notes (Signed)
No sign of pancreatitis! Great news.

## 2023-04-29 DIAGNOSIS — E876 Hypokalemia: Secondary | ICD-10-CM

## 2023-04-29 DIAGNOSIS — Z853 Personal history of malignant neoplasm of breast: Secondary | ICD-10-CM | POA: Insufficient documentation

## 2023-04-29 DIAGNOSIS — K529 Noninfective gastroenteritis and colitis, unspecified: Secondary | ICD-10-CM

## 2023-04-29 DIAGNOSIS — K922 Gastrointestinal hemorrhage, unspecified: Secondary | ICD-10-CM

## 2023-04-29 DIAGNOSIS — R197 Diarrhea, unspecified: Secondary | ICD-10-CM | POA: Insufficient documentation

## 2023-04-29 HISTORY — DX: Personal history of malignant neoplasm of breast: Z85.3

## 2023-04-29 HISTORY — DX: Hypokalemia: E87.6

## 2023-04-29 HISTORY — DX: Diarrhea, unspecified: R19.7

## 2023-04-29 HISTORY — DX: Noninfective gastroenteritis and colitis, unspecified: K52.9

## 2023-04-29 HISTORY — DX: Gastrointestinal hemorrhage, unspecified: K92.2

## 2023-05-02 ENCOUNTER — Telehealth: Payer: Self-pay

## 2023-05-02 NOTE — Transitions of Care (Post Inpatient/ED Visit) (Signed)
05/02/2023  Name: Debbie [name] MRN: [id number] DOB: [birth year]  Today's TOC FU Call Status: Today's TOC FU Call Status:: Unsuccessful Call (1st Attempt) Unsuccessful Call (1st Attempt) Date: 05/02/23  Attempted to reach the patient regarding the most recent Inpatient/ED visit.  Follow Up Plan: Additional outreach attempts will be made to reach the patient to complete the Transitions of Care (Post Inpatient/ED visit) call.     Antionette Fairy, RN,BSN,CCM [facility] Care Management Care Management Community Coordinator Direct Phone: [phone] Toll Free: [phone] Fax: [phone]

## 2023-05-05 ENCOUNTER — Telehealth: Payer: Self-pay

## 2023-05-05 NOTE — Transitions of Care (Post Inpatient/ED Visit) (Signed)
05/05/2023  Name: JENAH ELLINGTON MRN: [id number] DOB: [birth year]  Today's TOC FU Call Status: Today's TOC FU Call Status:: Unsuccessful Call (3rd Attempt) Unsuccessful Call (3rd Attempt) Date: 05/05/23  Attempted to reach the patient regarding the most recent Inpatient/ED visit.  Follow Up Plan: No further outreach attempts will be made at this time. We have been unable to contact the patient.    Antionette Fairy, RN,BSN,CCM [facility] Care Management Care Management Community Coordinator Direct Phone: [phone] Toll Free: [phone] Fax: [phone]

## 2023-05-05 NOTE — Transitions of Care (Post Inpatient/ED Visit) (Signed)
05/05/2023  Name: ANALIZE LIZ MRN: [id number] DOB: Apr 14, 1961  Today's TOC FU Call Status: Today's TOC FU Call Status:: Unsuccessful Call (2nd Attempt) Unsuccessful Call (2nd Attempt) Date: 05/05/23  Attempted to reach the patient regarding the most recent Inpatient/ED visit.  Follow Up Plan: Additional outreach attempts will be made to reach the patient to complete the Transitions of Care (Post Inpatient/ED visit) call.     Antionette Fairy, RN,BSN,CCM [facility] Care Management Care Management Community Coordinator Direct Phone: [phone] Toll Free: [phone] Fax: [phone]

## 2023-05-08 ENCOUNTER — Encounter: Payer: Self-pay | Admitting: Cardiology

## 2023-05-08 ENCOUNTER — Ambulatory Visit: Payer: BC Managed Care – PPO | Attending: Cardiology | Admitting: Cardiology

## 2023-05-08 VITALS — BP 118/72 | HR 72 | Ht 66.0 in | Wt 183.1 lb

## 2023-05-08 DIAGNOSIS — I10 Essential (primary) hypertension: Secondary | ICD-10-CM | POA: Diagnosis not present

## 2023-05-08 DIAGNOSIS — G4733 Obstructive sleep apnea (adult) (pediatric): Secondary | ICD-10-CM | POA: Diagnosis not present

## 2023-05-08 DIAGNOSIS — Z6830 Body mass index (BMI) 30.0-30.9, adult: Secondary | ICD-10-CM

## 2023-05-08 DIAGNOSIS — E6609 Other obesity due to excess calories: Secondary | ICD-10-CM

## 2023-05-08 DIAGNOSIS — I2510 Atherosclerotic heart disease of native coronary artery without angina pectoris: Secondary | ICD-10-CM | POA: Diagnosis not present

## 2023-05-08 DIAGNOSIS — I2584 Coronary atherosclerosis due to calcified coronary lesion: Secondary | ICD-10-CM

## 2023-05-08 DIAGNOSIS — E876 Hypokalemia: Secondary | ICD-10-CM

## 2023-05-08 NOTE — Patient Instructions (Signed)
Medication Instructions:  Your physician recommends that you continue on your current medications as directed. Please refer to the Current Medication list given to you today.  *If you need a refill on your cardiac medications before your next appointment, please call your pharmacy*   Lab Work: None ordered If you have labs (blood work) drawn today and your tests are completely normal, you will receive your results only by: MyChart Message (if you have MyChart) OR A paper copy in the mail If you have any lab test that is abnormal or we need to change your treatment, we will call you to review the results.   Testing/Procedures: None ordered   Follow-Up: At [facility], you and your health needs are our priority.  As part of our continuing mission to provide you with exceptional heart care, we have created designated Provider Care Teams.  These Care Teams include your primary Cardiologist (physician) and Advanced Practice Providers (APPs -  Physician Assistants and Nurse Practitioners) who all work together to provide you with the care you need, when you need it.  We recommend signing up for the patient portal called "MyChart".  Sign up information is provided on this After Visit Summary.  MyChart is used to connect with patients for Virtual Visits (Telemedicine).  Patients are able to view lab/test results, encounter notes, upcoming appointments, etc.  Non-urgent messages can be sent to your provider as well.   To learn more about what you can do with MyChart, go to [email or web address].    Your next appointment:   9 month(s)  The format for your next appointment:   In Person  Provider:   Belva Crome, MD    Other Instructions none  Important Information About Sugar

## 2023-05-08 NOTE — Progress Notes (Signed)
Cardiology Office Note:    Date:  05/08/2023   ID:  TOREE SANTOR, DOB [birth year], MRN [id number]  PCP:  Jomarie Longs, PA-C  Cardiologist:  Garwin Brothers, MD   Referring MD: Jomarie Longs, PA-C    ASSESSMENT:    1. Coronary artery calcification   2. Essential hypertension, benign   3. OSA (obstructive sleep apnea)   4. Class 1 obesity due to excess calories with serious comorbidity and body mass index (BMI) of 30.0 to 30.9 in adult   5. Hypokalemia    PLAN:    In order of problems listed above:  Coronary artery calcification: Secondary prevention stressed to the patient.  Importance of compliance with diet medication stressed and she vocalized understanding.  She was advised to walk at least half an hour a day on a regular basis and she promises to do so. Essential hypertension: Blood pressure stable and diet was emphasized. Mixed dyslipidemia: Statin therapy discussed and benefits discussed.  She wants to get her blood work checked by primary care.  Goal LDL must be less than 60 and statin therapy is recommended as guideline therapy in view of coronary artery calcification.  She will discuss this with her primary care when her blood work is back in October. Hypokalemia: She tells me that she will get to her primary care early next week to get this checked.  She has had diarrhea issues in the recent past. Patient will be seen in follow-up appointment in 6 months or earlier if the patient has any concerns.    Medication Adjustments/Labs and Tests Ordered: Current medicines are reviewed at length with the patient today.  Concerns regarding medicines are outlined above.  No orders of the defined types were placed in this encounter.  No orders of the defined types were placed in this encounter.    No chief complaint on file.    History of Present Illness:    Debbie [name] is a 62 y.o. female.  Patient has past medical history of coronary artery  calcification, essential hypertension, mixed dyslipidemia.  She denies any problems at this time and takes care of activities of daily living.  No chest pain orthopnea or PND.  She has had significant colon-itis and diarrhea so she has been lax with exercise over the past several weeks and she is getting back into the exercise pattern.  At the time of my evaluation, the patient is alert awake oriented and in no distress.  Past Medical History:  Diagnosis Date   Abnormal CT of the abdomen 11/11/2018   Anxiety    Asthma    with severe URIs   Bilateral hand pain 01/23/2018   Bladder mass 11/13/2018   Breast cancer (HCC)    Bronchitis    Chest tightness 07/09/2017   Chronic shoulder pain 06/26/2016   Class 1 obesity due to excess calories with serious comorbidity and body mass index (BMI) of 30.0 to 30.9 in adult 03/05/2013   Dr. Cathey Endow: Qsymia started July 2014, sleep study ordered.   Class 1 obesity due to excess calories without serious comorbidity with body mass index (BMI) of 30.0 to 30.9 in adult 02/27/2023   Colitis presumed infectious 04/29/2023   Coronary artery calcification 07/10/2022   Cough 07/09/2017   Diarrhea 04/29/2023   Elevated LFTs 05/11/2013   Liver biopsy normal spring 2015   Enlarged lymph nodes 11/13/2018   Essential hypertension, benign 12/01/2012   Family history of colon cancer    Family  history of stroke 04/29/2018   Fungal infection of toenail 01/17/2017   Gastritis 02/17/2013   GERD (gastroesophageal reflux disease)    GIB (gastrointestinal bleeding) 04/29/2023   Hammertoe of right foot 03/18/2023   Hand joint stiff, unspecified laterality 01/23/2018   History of breast cancer in adulthood 04/29/2023   History of hysterectomy 01/04/2013   History of shingles 01/04/2013   Age 67   Hypokalemia 04/29/2023   Hypotension 04/29/2018   Irritable bowel syndrome (IBS)    "had for years," per patient   Labral tear of shoulder 08/29/2014   Large breasts  06/26/2016   Liver lesion 11/13/2018   Liver nodule 11/11/2018   Malignant neoplasm of upper-inner quadrant of right breast in female, 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 IgG labs 2019.   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   No energy 10/20/2017   Nocturnal hypoxemia 11/23/2017   Non-restorative sleep 10/20/2017   OSA (obstructive sleep apnea) 11/23/2017   Osteopenia 12/20/2022   Palpitations 07/10/2022   Periodic limb movement 11/23/2017   Personal history of chemotherapy    Personal history of radiation therapy    PONV (postoperative nausea and vomiting)    Port-A-Cath in place 12/29/2018   Positive ANA (antinuclear antibody) 02/06/2018   Primary insomnia 06/26/2016   Primary osteoarthritis of both feet 04/29/2018   Primary osteoarthritis of both hands 04/29/2018   RBBB 05/13/2022   Reactive airway disease, mild intermittent, uncomplicated 10/21/2017   Rectocele 10/20/2017   Right foot pain 03/18/2023   Seasonal allergies    Sigmoid thickening 11/13/2018   Snoring 10/20/2017   SOB (shortness of breath) 07/09/2017   Stress 08/28/2020   Tachycardia 08/28/2020   Thyroid nodule 11/13/2018    Past Surgical History:  Procedure Laterality Date   ANKLE SURGERY     BREAST CYST EXCISION Bilateral    BREAST EXCISIONAL BIOPSY Bilateral    BREAST LUMPECTOMY Right 03/2019   BREAST LUMPECTOMY Left    benign. done around age 68   BREAST LUMPECTOMY WITH RADIOACTIVE SEED AND AXILLARY LYMPH NODE DISSECTION Right 04/15/2019   Procedure: RIGHT BREAST LUMPECTOMY WITH RADIOACTIVE SEED AND  TARGETED RIGHT AXILLARY LYMPH NODE DISSECTION WITH RADIOACTIVE SEED;  Surgeon: Ovidio Kin, MD;  Location: [facility];  Service: General;  Laterality: Right;   CHOLECYSTECTOMY  2012   KNEE ARTHROSCOPY W/ MEDIAL COLLATERAL LIGAMENT (MCL) REPAIR Left 10/07/2012    PORTACATH PLACEMENT N/A 11/16/2018   Procedure: INSERTION PORT-A-CATH WITH ULTRASOUND;  Surgeon: Ovidio Kin, MD;  Location: [facility];  Service: General;  Laterality: N/A;   REDUCTION MAMMAPLASTY Bilateral 11/17/2020   TONSILLECTOMY  1969   TOTAL ABDOMINAL HYSTERECTOMY  2011   with vaginal sling    Current Medications: Current Meds  Medication Sig   albuterol (PROAIR HFA) 108 (90 Base) MCG/ACT inhaler Inhale 2 puffs into the lungs every 4 (four) hours as needed for wheezing or shortness of breath.   AMBULATORY NON FORMULARY MEDICATION Continuous positive airway pressure (CPAP) machine set at autopap, with all supplemental supplies as needed.   calcium-vitamin D (OSCAL WITH D) 500-200 MG-UNIT tablet Take 1 tablet by mouth daily with breakfast.   losartan (COZAAR) 100 MG tablet TAKE 1 TABLET DAILY   metoprolol tartrate (LOPRESSOR) 25 MG tablet Take one tablet as needed for palpitations or increased heart rate. Ok to take up to twice a day.   Multiple Vitamin (MULTIVITAMIN WITH MINERALS) TABS  tablet Take 1 tablet by mouth daily.   ondansetron (ZOFRAN-ODT) 8 MG disintegrating tablet Take 1 tablet (8 mg total) by mouth every 8 (eight) hours as needed.   tamoxifen (NOLVADEX) 20 MG tablet Take 1 tablet (20 mg total) by mouth daily.   tirzepatide (ZEPBOUND) 5 MG/0.5ML Pen Inject 5 mg into the skin once a week.   valACYclovir (VALTREX) 1000 MG tablet TAKE 1 TABLET TWICE A DAY AS NEEDED FOR COLD SORE   venlafaxine XR (EFFEXOR-XR) 75 MG 24 hr capsule Take 1 capsule (75 mg total) by mouth daily with breakfast.     Allergies:   Erythromycin, Penicillins, Azithromycin, Erythromycin base, Levofloxacin, and Penicillin g   Social History   Socioeconomic History   Marital status: Married    Spouse name: Harvie Heck   Number of children: 2   Years of education: Not on file   Highest education level: Bachelor's degree (e.g., BA, AB, BS)  Occupational History   Not on file  Tobacco Use   Smoking status:  Never   Smokeless tobacco: Never  Vaping Use   Vaping status: Never Used  Substance and Sexual Activity   Alcohol use: Yes    Comment: rare   Drug use: No   Sexual activity: Not Currently  Other Topics Concern   Not on file  Social History Narrative   Not on file   Social Determinants of Health   Financial Resource Strain: Low Risk  (02/27/2023)   Overall Financial Resource Strain (CARDIA)    Difficulty of Paying Living Expenses: Not very hard  Food Insecurity: No Food Insecurity (04/30/2023)   Received from [facility]   Hunger Vital Sign    Worried About Running Out of Food in the Last Year: Never true    Ran Out of Food in the Last Year: Never true  Transportation Needs: No Transportation Needs (04/30/2023)   Received from [facility] - Transportation    Lack of Transportation (Medical): No    Lack of Transportation (Non-Medical): No  Physical Activity: Sufficiently Active (02/27/2023)   Exercise Vital Sign    Days of Exercise per Week: 4 days    Minutes of Exercise per Session: 50 min  Stress: No Stress Concern Present (04/30/2023)   Received from [facility] - Occupational Stress Questionnaire    Feeling of Stress : Not at all  Social Connections: Moderately Integrated (02/27/2023)   Social Connection and Isolation Panel [NHANES]    Frequency of Communication with Friends and Family: Once a week    Frequency of Social Gatherings with Friends and Family: Twice a week    Attends Religious Services: 1 to 4 times per year    Active Member of [employer] or Organizations: No    Attends Engineer, structural: Not on file    Marital Status: Married     Family History: The patient's family history includes Alcoholism in her father; Atrial fibrillation in her brother; Colon cancer (age of onset: 73) in her father; Depression in her father; Diabetes in her father; Healthy in her daughter and daughter; Heart failure in  her mother; Hyperlipidemia in her maternal grandfather; Hypertension in her brother, father, and mother; Lung cancer in her father; Rheum arthritis in her mother; Skin cancer in her father and paternal grandmother; Stroke in her brother and paternal grandmother. There is no history of Breast cancer, Ovarian cancer, Prostate cancer, or Pancreatic cancer.  ROS:   Please see the  history of present illness.    All other systems reviewed and are negative.  EKGs/Labs/Other Studies Reviewed:    The following studies were reviewed today: I discussed my findings with the patient at length   Recent Labs: 05/13/2022: TSH 2.14 05/30/2022: Hemoglobin 13.7; Platelet Count 222 07/10/2022: ALT 22; BUN 12; Creatinine, Ser 0.73; Potassium 3.2; Sodium 143  Recent Lipid Panel    Component Value Date/Time   CHOL 185 05/13/2022 0000   TRIG 111 05/13/2022 0000   HDL 88 05/13/2022 0000   CHOLHDL 2.1 05/13/2022 0000   VLDL 17 06/09/2019 1236   LDLCALC 78 05/13/2022 0000    Physical Exam:    VS:  BP 118/72   Pulse 72   Ht 5\' 6"  (1.676 m)   Wt 183 lb 1.3 oz (83 kg)   SpO2 97%   BMI 29.55 kg/m     Wt Readings from Last 3 Encounters:  05/08/23 183 lb 1.3 oz (83 kg)  02/27/23 189 lb (85.7 kg)  12/09/22 187 lb 12.8 oz (85.2 kg)     GEN: Patient is in no acute distress HEENT: Normal NECK: No JVD; No carotid bruits LYMPHATICS: No lymphadenopathy CARDIAC: Hear sounds regular, 2/6 systolic murmur at the apex. RESPIRATORY:  Clear to auscultation without rales, wheezing or rhonchi  ABDOMEN: Soft, non-tender, non-distended MUSCULOSKELETAL:  No edema; No deformity  SKIN: Warm and dry NEUROLOGIC:  Alert and oriented x 3 PSYCHIATRIC:  Normal affect   Signed, Garwin Brothers, MD  05/08/2023 2:39 PM    [facility]

## 2023-05-09 ENCOUNTER — Encounter: Payer: Self-pay | Admitting: Physician Assistant

## 2023-05-13 ENCOUNTER — Telehealth (INDEPENDENT_AMBULATORY_CARE_PROVIDER_SITE_OTHER): Payer: BC Managed Care – PPO | Admitting: Physician Assistant

## 2023-05-13 DIAGNOSIS — K529 Noninfective gastroenteritis and colitis, unspecified: Secondary | ICD-10-CM

## 2023-05-13 DIAGNOSIS — E876 Hypokalemia: Secondary | ICD-10-CM | POA: Diagnosis not present

## 2023-05-13 DIAGNOSIS — E6609 Other obesity due to excess calories: Secondary | ICD-10-CM | POA: Diagnosis not present

## 2023-05-13 DIAGNOSIS — A09 Infectious gastroenteritis and colitis, unspecified: Secondary | ICD-10-CM

## 2023-05-13 MED ORDER — ZEPBOUND 2.5 MG/0.5ML ~~LOC~~ SOAJ
2.5000 mg | SUBCUTANEOUS | 0 refills | Status: DC
Start: 2023-05-13 — End: 2023-06-10

## 2023-05-13 NOTE — Telephone Encounter (Signed)
We had in  person visit today to discuss this.

## 2023-05-13 NOTE — Progress Notes (Unsigned)
..Virtual Visit via Video Note  I connected with Debbie [name] on 05/13/23 at  9:10 AM EDT by a video enabled telemedicine application and verified that I am speaking with the correct person using two identifiers.  Location: Patient: home Provider: clinic  .Marland KitchenParticipating in visit:  Patient: Debbie [name] Provider: Tandy Gaw PA-C Provider in training: Weston Anna PA-S   I discussed the limitations of evaluation and management by telemedicine and the availability of in person appointments. The patient expressed understanding and agreed to proceed.  History of Present Illness: Pt is a 62 yo female who calls into the clinic for follow up after hospital visit for infectious colitis with hypomagnesium/hypokalemia on 04/28/2023. She was treated with antibiotics and fluids. Not clear on etiology but do not think zepbound is responsible for this. She did eat a hamburger at [employer] that was a little pink inside. She is feeling much better and has not had zepbound in 2 weeks. She would like to restart.   .. Active Ambulatory Problems    Diagnosis Date Noted   Essential hypertension, benign 12/01/2012   Family history of colon cancer 12/01/2012   History of hysterectomy 01/04/2013   History of shingles 01/04/2013   Gastritis 02/17/2013   Class 1 obesity due to excess calories with serious comorbidity and body mass index (BMI) of 30.0 to 30.9 in adult 03/05/2013   Elevated LFTs 05/11/2013   Labral tear of shoulder 08/29/2014   Large breasts 06/26/2016   Primary insomnia 06/26/2016   Mid back pain 06/26/2016   Chronic shoulder pain 06/26/2016   Fungal infection of toenail 01/17/2017   Chest tightness 07/09/2017   SOB (shortness of breath) 07/09/2017   Cough 07/09/2017   Snoring 10/20/2017   Rectocele 10/20/2017   Non-restorative sleep 10/20/2017   No energy 10/20/2017   Reactive airway disease, mild intermittent, uncomplicated 10/21/2017   Periodic limb movement 11/23/2017    OSA (obstructive sleep apnea) 11/23/2017   Nocturnal hypoxemia 11/23/2017   Bilateral hand pain 01/23/2018   Hand joint stiff, unspecified laterality 01/23/2018   Positive ANA (antinuclear antibody) 02/06/2018   Hypotension 04/29/2018   Family history of stroke 04/29/2018   Primary osteoarthritis of both hands 04/29/2018   Primary osteoarthritis of both feet 04/29/2018   Multiple food allergies 07/20/2018   Malignant neoplasm of upper-inner quadrant of right breast in female, estrogen receptor positive (HCC) 11/02/2018   Liver nodule 11/11/2018   Abnormal CT of the abdomen 11/11/2018   Liver lesion 11/13/2018   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   Sigmoid thickening 11/13/2018   Bladder mass 11/13/2018   Thyroid nodule 11/13/2018   Enlarged lymph nodes 11/13/2018   Port-A-Cath in place 12/29/2018   Stress 08/28/2020   Tachycardia 08/28/2020   RBBB 05/13/2022   Anxiety 07/09/2022   Asthma 07/09/2022   Breast cancer (HCC) 07/09/2022   Bronchitis 07/09/2022   Personal history of chemotherapy 12/01/2012   Personal history of radiation therapy 07/09/2022   PONV (postoperative nausea and vomiting) 07/09/2022   Seasonal allergies 07/09/2022   GERD (gastroesophageal reflux disease) 07/09/2022   Irritable bowel syndrome (IBS) 07/09/2022   Coronary artery calcification 07/10/2022   Palpitations 07/10/2022   Osteopenia 12/20/2022   Class 1 obesity due to excess calories without serious comorbidity with body mass index (BMI) of 30.0 to 30.9 in adult 02/27/2023   Hammertoe of right foot 03/18/2023   Right foot pain 03/18/2023   Colitis presumed infectious 04/29/2023   Diarrhea 04/29/2023   GIB (  gastrointestinal bleeding) 04/29/2023   History of breast cancer in adulthood 04/29/2023   Hypokalemia 04/29/2023   Resolved Ambulatory Problems    Diagnosis Date Noted   Generalized anxiety disorder 12/01/2012   No Additional Past Medical History      Observations/Objective: No acute distress  Normal mood and appearance Normal breathing   Assessment and Plan: Marland KitchenMarland KitchenDiagnoses and all orders for this visit:  Colitis presumed infectious -     CMP14+EGFR -     Magnesium -     CBC w/Diff/Platelet  Hypokalemia -     CMP14+EGFR -     Magnesium -     CBC w/Diff/Platelet  Hypomagnesemia -     CMP14+EGFR -     Magnesium -     CBC w/Diff/Platelet  Class 1 obesity due to excess calories with serious comorbidity and body mass index (BMI) of 30.0 to 30.9 in adult -     tirzepatide (ZEPBOUND) 2.5 MG/0.5ML Pen; Inject 2.5 mg into the skin once a week.  Pts symptoms have resolved and feeling much better Get labs on Thursday then will restart zepbound at 2.5mg  if labs have improved back to normal   Follow Up Instructions:    I discussed the assessment and treatment plan with the patient. The patient was provided an opportunity to ask questions and all were answered. The patient agreed with the plan and demonstrated an understanding of the instructions.   The patient was advised to call back or seek an in-person evaluation if the symptoms worsen or if the condition fails to improve as anticipated.  Tandy Gaw, PA-C

## 2023-05-14 ENCOUNTER — Encounter: Payer: Self-pay | Admitting: Physician Assistant

## 2023-05-14 HISTORY — DX: Hypomagnesemia: E83.42

## 2023-05-15 ENCOUNTER — Encounter: Payer: Self-pay | Admitting: Physician Assistant

## 2023-05-15 DIAGNOSIS — K529 Noninfective gastroenteritis and colitis, unspecified: Secondary | ICD-10-CM | POA: Diagnosis not present

## 2023-05-15 DIAGNOSIS — E876 Hypokalemia: Secondary | ICD-10-CM | POA: Diagnosis not present

## 2023-05-16 LAB — CMP14+EGFR
ALT: 17 [IU]/L (ref 0–32)
AST: 18 [IU]/L (ref 0–40)
Albumin: 4.1 g/dL (ref 3.9–4.9)
Alkaline Phosphatase: 65 [IU]/L (ref 44–121)
BUN/Creatinine Ratio: 22 (ref 12–28)
BUN: 15 mg/dL (ref 8–27)
Bilirubin Total: 0.6 mg/dL (ref 0.0–1.2)
CO2: 25 mmol/L (ref 20–29)
Calcium: 8.9 mg/dL (ref 8.7–10.3)
Chloride: 108 mmol/L — ABNORMAL HIGH (ref 96–106)
Creatinine, Ser: 0.69 mg/dL (ref 0.57–1.00)
Globulin, Total: 1.7 g/dL (ref 1.5–4.5)
Glucose: 90 mg/dL (ref 70–99)
Potassium: 4.1 mmol/L (ref 3.5–5.2)
Sodium: 144 mmol/L (ref 134–144)
Total Protein: 5.8 g/dL — ABNORMAL LOW (ref 6.0–8.5)
eGFR: 97 mL/min/{1.73_m2} (ref >59)

## 2023-05-16 LAB — CBC WITH DIFFERENTIAL/PLATELET
Basophils Absolute: 0 10*3/uL (ref 0.0–0.2)
Basos: 0 %
EOS (ABSOLUTE): 0.2 10*3/uL (ref 0.0–0.4)
Eos: 4 %
Hematocrit: 40.2 % (ref 34.0–46.6)
Hemoglobin: 13.4 g/dL (ref 11.1–15.9)
Immature Grans (Abs): 0 10*3/uL (ref 0.0–0.1)
Immature Granulocytes: 0 %
Lymphocytes Absolute: 1 10*3/uL (ref 0.7–3.1)
Lymphs: 20 %
MCH: 30.5 pg (ref 26.6–33.0)
MCHC: 33.3 g/dL (ref 31.5–35.7)
MCV: 91 fL (ref 79–97)
Monocytes Absolute: 0.5 10*3/uL (ref 0.1–0.9)
Monocytes: 10 %
Neutrophils Absolute: 3.5 10*3/uL (ref 1.4–7.0)
Neutrophils: 66 %
Platelets: 272 10*3/uL (ref 150–450)
RBC: 4.4 x10E6/uL (ref 3.77–5.28)
RDW: 12.6 % (ref 11.7–15.4)
WBC: 5.3 10*3/uL (ref 3.4–10.8)

## 2023-05-16 LAB — MAGNESIUM: Magnesium: 2 mg/dL (ref 1.6–2.3)

## 2023-05-16 NOTE — Telephone Encounter (Signed)
It may just be a mistake I would say lets reach out to the patient and find out why she is trying to fill 2.5 and verify that she really is taking 7 and then let her know that per our records she still has an approval on file.

## 2023-05-16 NOTE — Progress Notes (Signed)
Labs look much better than from the hospital. Potassium normal. Sodium normal. Magnesium normal. Ok to restart zepbound.

## 2023-05-16 NOTE — Telephone Encounter (Signed)
Attempted call to patient. Left a voice mail message requesting a return call.  

## 2023-05-19 NOTE — Telephone Encounter (Signed)
Spoke with patient. Lesly Rubenstein had asked that she go back to 2.5mg  as she was out of the 5mg  x 4 weeks due to hospitalization.  She will remainon 2.5mg   for 4 weeks and then return to 5mg  for 4 weeks. Can a prior auth be done again for the 2.5mg  instead?

## 2023-05-20 ENCOUNTER — Encounter: Payer: Self-pay | Admitting: Physician Assistant

## 2023-05-30 ENCOUNTER — Telehealth: Payer: Self-pay

## 2023-05-30 ENCOUNTER — Ambulatory Visit: Payer: BC Managed Care – PPO | Admitting: Physician Assistant

## 2023-05-30 NOTE — Telephone Encounter (Signed)
Initiated Prior authorization [id number] 2.5MG /0.5ML pen-injectors  Via: Covermymeds Case/Key:[id number] Status: approved as of 05/30/23 Reason:05/30/23-06/08/23,pt  can restart  the 2.5 mg dose with the exception that the pt would have to titrate to the next dose after Notified Pt via: Mychart

## 2023-06-02 ENCOUNTER — Ambulatory Visit (INDEPENDENT_AMBULATORY_CARE_PROVIDER_SITE_OTHER): Payer: BC Managed Care – PPO | Admitting: Physician Assistant

## 2023-06-02 ENCOUNTER — Encounter: Payer: Self-pay | Admitting: Physician Assistant

## 2023-06-02 VITALS — BP 134/79 | HR 67 | Ht 66.0 in | Wt 186.0 lb

## 2023-06-02 DIAGNOSIS — E6609 Other obesity due to excess calories: Secondary | ICD-10-CM

## 2023-06-02 DIAGNOSIS — I10 Essential (primary) hypertension: Secondary | ICD-10-CM | POA: Diagnosis not present

## 2023-06-02 DIAGNOSIS — E66811 Obesity, class 1: Secondary | ICD-10-CM

## 2023-06-02 DIAGNOSIS — F439 Reaction to severe stress, unspecified: Secondary | ICD-10-CM

## 2023-06-02 DIAGNOSIS — Z6830 Body mass index (BMI) 30.0-30.9, adult: Secondary | ICD-10-CM

## 2023-06-02 MED ORDER — LOSARTAN POTASSIUM 100 MG PO TABS
100.0000 mg | ORAL_TABLET | Freq: Every day | ORAL | 3 refills | Status: DC
Start: 2023-06-02 — End: 2024-06-07

## 2023-06-02 MED ORDER — TIRZEPATIDE 5 MG/0.5ML ~~LOC~~ SOAJ: 5.0000 mg | SUBCUTANEOUS | 0 refills | Status: DC

## 2023-06-02 MED ORDER — VENLAFAXINE HCL ER 75 MG PO CP24
75.0000 mg | ORAL_CAPSULE | Freq: Every day | ORAL | 3 refills | Status: DC
Start: 2023-06-02 — End: 2024-06-07

## 2023-06-02 NOTE — Progress Notes (Unsigned)
Established Patient Office Visit  Subjective   Patient ID: Debbie [name], female    DOB: [birth year]  Age: 62 y.o. MRN: [id number]  No chief complaint on file.   HPI Tues thurs strength Walking   {History (Optional):23778}  ROS    Objective:     BP 134/79   Pulse 67   Ht 5\' 6"  (1.676 m)   Wt 186 lb (84.4 kg)   SpO2 97%   BMI 30.02 kg/m  BP Readings from Last 3 Encounters:  06/02/23 134/79  05/08/23 118/72  02/27/23 (!) 126/58   Wt Readings from Last 3 Encounters:  06/02/23 186 lb (84.4 kg)  05/08/23 183 lb 1.3 oz (83 kg)  02/27/23 189 lb (85.7 kg)      Physical Exam    The 10-year ASCVD risk score (Arnett DK, et al., 2019) is: 4.9%    Assessment & Plan:  Marland KitchenMarland KitchenDiagnoses and all orders for this visit:  Class 1 obesity due to excess calories with serious comorbidity and body mass index (BMI) of 30.0 to 30.9 in adult -     tirzepatide [facility]) 5 MG/0.5ML Pen; Inject 5 mg into the skin once a week.  Essential hypertension, benign -     tirzepatide (MOUNJARO) 5 MG/0.5ML Pen; Inject 5 mg into the skin once a week. -     losartan (COZAAR) 100 MG tablet; Take 1 tablet (100 mg total) by mouth daily.  Stress -     venlafaxine XR (EFFEXOR-XR) 75 MG 24 hr capsule; Take 1 capsule (75 mg total) by mouth daily with breakfast.      Return in about 6 months (around 12/01/2023), or if symptoms worsen or fail to improve.    Tandy Gaw, PA-C

## 2023-06-03 ENCOUNTER — Encounter: Payer: Self-pay | Admitting: Physician Assistant

## 2023-06-10 ENCOUNTER — Encounter: Payer: Self-pay | Admitting: Physician Assistant

## 2023-06-10 MED ORDER — OMEPRAZOLE 40 MG PO CPDR: 40.0000 mg | DELAYED_RELEASE_CAPSULE | Freq: Every day | ORAL | 0 refills | Status: DC

## 2023-06-10 MED ORDER — DIPHENOXYLATE-ATROPINE 2.5-0.025 MG PO TABS: 1.0000 | ORAL_TABLET | Freq: Four times a day (QID) | ORAL | 0 refills | Status: DC | PRN

## 2023-06-10 MED ORDER — PROMETHAZINE HCL 25 MG PO TABS: 25.0000 mg | ORAL_TABLET | Freq: Four times a day (QID) | ORAL | 0 refills | Status: DC | PRN

## 2023-06-18 ENCOUNTER — Encounter: Payer: Self-pay | Admitting: Physician Assistant

## 2023-06-18 DIAGNOSIS — E66811 Obesity, class 1: Secondary | ICD-10-CM

## 2023-06-18 DIAGNOSIS — I10 Essential (primary) hypertension: Secondary | ICD-10-CM

## 2023-06-23 MED ORDER — SEMAGLUTIDE-WEIGHT MANAGEMENT 0.25 MG/0.5ML ~~LOC~~ SOAJ: 0.2500 mg | SUBCUTANEOUS | 0 refills | Status: AC

## 2023-06-23 MED ORDER — SEMAGLUTIDE-WEIGHT MANAGEMENT 0.5 MG/0.5ML ~~LOC~~ SOAJ: 0.5000 mg | SUBCUTANEOUS | 0 refills | Status: AC

## 2023-07-07 ENCOUNTER — Other Ambulatory Visit: Payer: Self-pay | Admitting: Physician Assistant

## 2023-07-10 ENCOUNTER — Telehealth: Payer: Self-pay

## 2023-07-10 DIAGNOSIS — Z713 Dietary counseling and surveillance: Secondary | ICD-10-CM | POA: Diagnosis not present

## 2023-07-10 NOTE — Telephone Encounter (Signed)
Initiated Prior authorization FIE:[id number] 0.5MG /0.5ML auto-injectors Via: Covermymeds Case/Key:[id number] Status: approved as of 07/10/23 Reason:Coverage End Date:02/05/2024; Notified Pt via: Mychart

## 2023-07-31 DIAGNOSIS — Z713 Dietary counseling and surveillance: Secondary | ICD-10-CM | POA: Diagnosis not present

## 2023-08-07 DIAGNOSIS — R194 Change in bowel habit: Secondary | ICD-10-CM | POA: Diagnosis not present

## 2023-09-01 ENCOUNTER — Encounter: Payer: Self-pay | Admitting: Physician Assistant

## 2023-09-01 ENCOUNTER — Other Ambulatory Visit: Payer: Self-pay | Admitting: Hematology and Oncology

## 2023-09-01 DIAGNOSIS — Z9889 Other specified postprocedural states: Secondary | ICD-10-CM

## 2023-09-02 MED ORDER — WEGOVY 1 MG/0.5ML ~~LOC~~ SOAJ: 1.0000 mg | SUBCUTANEOUS | 0 refills | Status: DC

## 2023-09-15 ENCOUNTER — Encounter: Payer: Self-pay | Admitting: Physician Assistant

## 2023-09-16 MED ORDER — NALTREXONE-BUPROPION HCL ER 8-90 MG PO TB12: ORAL_TABLET | ORAL | 0 refills | Status: DC

## 2023-09-19 DIAGNOSIS — K3189 Other diseases of stomach and duodenum: Secondary | ICD-10-CM | POA: Diagnosis not present

## 2023-09-19 DIAGNOSIS — K449 Diaphragmatic hernia without obstruction or gangrene: Secondary | ICD-10-CM | POA: Diagnosis not present

## 2023-09-19 DIAGNOSIS — R197 Diarrhea, unspecified: Secondary | ICD-10-CM | POA: Diagnosis not present

## 2023-09-24 MED ORDER — NALTREXONE-BUPROPION HCL ER 8-90 MG PO TB12: ORAL_TABLET | ORAL | 0 refills | Status: DC

## 2023-09-24 NOTE — Addendum Note (Signed)
 Addended by: Doretha Ganja on: 09/24/2023 01:21 PM   Modules accepted: Orders

## 2023-09-24 NOTE — Addendum Note (Signed)
 Addended by: Araceli Knight on: 09/24/2023 03:30 PM   Modules accepted: Orders

## 2023-09-29 ENCOUNTER — Telehealth: Payer: BC Managed Care – PPO | Admitting: Medical-Surgical

## 2023-09-29 ENCOUNTER — Telehealth: Payer: Self-pay

## 2023-09-29 DIAGNOSIS — U071 COVID-19: Secondary | ICD-10-CM | POA: Diagnosis not present

## 2023-09-29 MED ORDER — METHYLPREDNISOLONE 4 MG PO TBPK
ORAL_TABLET | ORAL | 0 refills | Status: DC
Start: 2023-09-29 — End: 2023-12-01

## 2023-09-29 MED ORDER — NIRMATRELVIR/RITONAVIR (PAXLOVID)TABLET: 3.0000 | ORAL_TABLET | Freq: Two times a day (BID) | ORAL | 0 refills | Status: AC

## 2023-09-29 MED ORDER — HYDROCOD POLI-CHLORPHE POLI ER 10-8 MG/5ML PO SUER: 5.0000 mL | Freq: Two times a day (BID) | ORAL | 0 refills | Status: DC | PRN

## 2023-09-29 NOTE — Progress Notes (Signed)
 Virtual Visit via Video Note  I connected with Debbie [name] on 09/29/23 at 10:30 AM EST by a video enabled telemedicine application and verified that I am speaking with the correct person using two identifiers.   I discussed the limitations of evaluation and management by telemedicine and the availability of in person appointments. The patient expressed understanding and agreed to proceed.  Patient location: home Provider locations: office  Subjective:    CC: COVID-19 positive  HPI: Very pleasant 63 year old female presents via MyChart video visit with reports of testing positive last night.  She began having symptoms including sore throat, cough, headache, runny nose, and back pain last night around 10:00.  Her husband had COVID last week so she decided to do a test.  Notes that the COVID test had a faint pink line but was not dark.  She took Advil  last night for the headache but has not taken anything otherwise.  Notes a history of asthma and already feels that her chest is tight and she has some congestion with her cough.  Has an albuterol  inhaler that she uses on an as-needed basis.  Past medical history, Surgical history, Family history not pertinant except as noted below, Social history, Allergies, and medications have been entered into the medical record, reviewed, and corrections made.   Review of Systems: See HPI for pertinent positives and negatives.   Objective:    General: Speaking clearly in complete sentences without any shortness of breath.  Alert and oriented x3.  Normal judgment. No apparent acute distress.  Impression and Recommendations:    1. COVID-19 virus infection (Primary) Faint positive on home test with associated symptoms and recent exposure.  Treating with Paxlovid  twice daily x 5 days.  Adding Tussionex for cough management.  Ultimately, would like for her to hold off on steroids until at least day 5 through 7 but with a setting of asthma, I agreed to go  ahead and send a Medrol  Dosepak to her pharmacy and she will only take this if her respiratory status starts to deteriorate.  Discussed over-the-counter symptom management with Coricidin or NyQuil/DayQuil HBP.  Reviewed Tylenol  and Advil  dosing recommendations.  Offered work note however she works from home and feels that she will be able to do what she needs to.  If this changes, she will let me know and I will be glad to send a note for time missed.  Also reviewed CDC recommendations for quarantine and masking.  All questions answered at the time of the appointment.   I discussed the assessment and treatment plan with the patient. The patient was provided an opportunity to ask questions and all were answered. The patient agreed with the plan and demonstrated an understanding of the instructions.   The patient was advised to call back or seek an in-person evaluation if the symptoms worsen or if the condition fails to improve as anticipated.  Return if symptoms worsen or fail to improve.  Maryl Snook, DNP, APRN, FNP-BC [facility]

## 2023-09-29 NOTE — Telephone Encounter (Signed)
 Copied from CRM [id number]. Topic: Clinical - Prescription Issue >> Sep 29, 2023  2:23 PM Hilton Lucky wrote: Reason for CRM: Patient is calling to state that she just checked with her pharmacy and they have not received the prescriptions physician stated would be sent in. Patient would like these medications sent now so she can start taking them.

## 2023-09-29 NOTE — Addendum Note (Signed)
 Addended byCherre Cornish on: 09/29/2023 08:06 PM   Modules accepted: Orders

## 2023-09-30 ENCOUNTER — Telehealth: Payer: Self-pay

## 2023-09-30 NOTE — Telephone Encounter (Signed)
Copied from CRM [id number]. Topic: Clinical - Prescription Issue >> Sep 29, 2023  5:02 PM Gery Pray wrote: Reason for CRM: Patient calling because prescription for Paxlovid has not been submitted to the pharmacy. Patient can be reached at [phone] regarding.

## 2023-09-30 NOTE — Telephone Encounter (Signed)
Called patient and left a voice mail message that if still having problems getting paxlovid to please call our office tomorrow   Called [facility] pharmacy  [place] states patient had picked up paxlovid yesterday.

## 2023-09-30 NOTE — Telephone Encounter (Signed)
Patient informed. She will check with pharmacy again today to see if medication has been received.

## 2023-10-06 ENCOUNTER — Encounter: Payer: Self-pay | Admitting: Physician Assistant

## 2023-10-06 DIAGNOSIS — R194 Change in bowel habit: Secondary | ICD-10-CM

## 2023-10-08 DIAGNOSIS — R194 Change in bowel habit: Secondary | ICD-10-CM | POA: Insufficient documentation

## 2023-10-08 HISTORY — DX: Change in bowel habit: R19.4

## 2023-11-07 DIAGNOSIS — Z713 Dietary counseling and surveillance: Secondary | ICD-10-CM | POA: Diagnosis not present

## 2023-11-10 ENCOUNTER — Encounter: Payer: Self-pay | Admitting: Physician Assistant

## 2023-11-14 DIAGNOSIS — J3089 Other allergic rhinitis: Secondary | ICD-10-CM | POA: Diagnosis not present

## 2023-11-14 DIAGNOSIS — R052 Subacute cough: Secondary | ICD-10-CM | POA: Diagnosis not present

## 2023-11-14 DIAGNOSIS — H1045 Other chronic allergic conjunctivitis: Secondary | ICD-10-CM | POA: Diagnosis not present

## 2023-11-14 DIAGNOSIS — J301 Allergic rhinitis due to pollen: Secondary | ICD-10-CM | POA: Diagnosis not present

## 2023-11-19 ENCOUNTER — Ambulatory Visit
Admission: RE | Admit: 2023-11-19 | Discharge: 2023-11-19 | Disposition: A | Discharge status: Home / Self Care | Payer: BC Managed Care – PPO | Source: Ambulatory Visit | Attending: Hematology and Oncology

## 2023-11-19 DIAGNOSIS — Z9889 Other specified postprocedural states: Secondary | ICD-10-CM

## 2023-11-19 DIAGNOSIS — Z853 Personal history of malignant neoplasm of breast: Secondary | ICD-10-CM | POA: Diagnosis not present

## 2023-11-19 DIAGNOSIS — Z08 Encounter for follow-up examination after completed treatment for malignant neoplasm: Secondary | ICD-10-CM | POA: Diagnosis not present

## 2023-11-28 ENCOUNTER — Other Ambulatory Visit: Payer: Self-pay | Admitting: *Deleted

## 2023-11-28 MED ORDER — TAMOXIFEN CITRATE 20 MG PO TABS: 20.0000 mg | ORAL_TABLET | Freq: Every day | ORAL | 4 refills | Status: DC

## 2023-12-01 ENCOUNTER — Encounter: Payer: Self-pay | Admitting: Physician Assistant

## 2023-12-01 ENCOUNTER — Ambulatory Visit: Payer: BC Managed Care – PPO | Admitting: Physician Assistant

## 2023-12-01 ENCOUNTER — Encounter: Payer: Self-pay | Admitting: Oncology

## 2023-12-01 ENCOUNTER — Telehealth: Payer: Self-pay

## 2023-12-01 ENCOUNTER — Other Ambulatory Visit (HOSPITAL_COMMUNITY): Payer: Self-pay

## 2023-12-01 VITALS — BP 119/80 | HR 73 | Ht 66.0 in | Wt 181.0 lb

## 2023-12-01 DIAGNOSIS — Z8639 Personal history of other endocrine, nutritional and metabolic disease: Secondary | ICD-10-CM | POA: Diagnosis not present

## 2023-12-01 DIAGNOSIS — E663 Overweight: Secondary | ICD-10-CM

## 2023-12-01 DIAGNOSIS — I10 Essential (primary) hypertension: Secondary | ICD-10-CM

## 2023-12-01 HISTORY — DX: Overweight: E66.3

## 2023-12-01 MED ORDER — PHENTERMINE HCL 37.5 MG PO TABS: ORAL_TABLET | ORAL | 0 refills | Status: DC

## 2023-12-01 NOTE — Telephone Encounter (Signed)
 Pharmacy Patient Advocate Encounter   Received notification from Patient Pharmacy that prior authorization for Phentermine 37.5 is required/requested.   Insurance verification completed.   The patient is insured through [facility] .   Per test claim: PA required; PA submitted to above mentioned insurance via CoverMyMeds Key/confirmation #/EOC [facility] Status is pending

## 2023-12-01 NOTE — Telephone Encounter (Signed)
 Pharmacy Patient Advocate Encounter  Received notification from [employer] that Prior Authorization for Phentermine 37.5 has been DENIED.  Full denial letter will be uploaded to the media tab. See denial reason below.   PA #/Case ID/Reference #: [id number]

## 2023-12-01 NOTE — Progress Notes (Unsigned)
 Established Patient Office Visit  Subjective   Patient ID: Debbie [name], female    DOB: [birth year]  Age: 63 y.o. MRN: [id number]  Chief Complaint  Patient presents with   Medical Management of Chronic Issues    6 mo fup on wgt management     HPI Pt is a 63 yo overweight female who presents to the clinic to follow up on weight. She feels like for the last year she has craved more sugar and been hungrier than ever. It has been so hard to not gain weight and even harder to lose weight. She was approved for wegovy and not able to tolerate due to GI side effects. She then tried zepbound and ended up in the hospital with diarrhea, nausea, vomiting. She tried the shots one more time and noticed the GI side effects again. She loved how it took away her cravings and food noise.   She was placed on contrave and has been on for the last 6 weeks. She does not feel like helps with cravings and does not really like the way it makes her feel. She is down 5lbs since October 2024. She is interested in phentermine that her friend is on. She is very active. She is exercising 4-5 days a week and doing strength training. She is trying to make healthy food choices and portion control.   .. Active Ambulatory Problems    Diagnosis Date Noted   Essential hypertension, benign 12/01/2012   Family history of colon cancer 12/01/2012   History of hysterectomy 01/04/2013   History of shingles 01/04/2013   Gastritis 02/17/2013   Class 1 obesity due to excess calories with serious comorbidity and body mass index (BMI) of 30.0 to 30.9 in adult 03/05/2013   Elevated LFTs 05/11/2013   Labral tear of shoulder 08/29/2014   Large breasts 06/26/2016   Primary insomnia 06/26/2016   Mid back pain 06/26/2016   Chronic shoulder pain 06/26/2016   Fungal infection of toenail 01/17/2017   Chest tightness 07/09/2017   SOB (shortness of breath) 07/09/2017   Cough 07/09/2017   Snoring 10/20/2017   Rectocele 10/20/2017    Non-restorative sleep 10/20/2017   No energy 10/20/2017   Reactive airway disease, mild intermittent, uncomplicated 10/21/2017   Periodic limb movement 11/23/2017   OSA (obstructive sleep apnea) 11/23/2017   Nocturnal hypoxemia 11/23/2017   Bilateral hand pain 01/23/2018   Hand joint stiff, unspecified laterality 01/23/2018   Positive ANA (antinuclear antibody) 02/06/2018   Hypotension 04/29/2018   Family history of stroke 04/29/2018   Primary osteoarthritis of both hands 04/29/2018   Primary osteoarthritis of both feet 04/29/2018   Multiple food allergies 07/20/2018   Malignant neoplasm of upper-inner quadrant of right breast in female, estrogen receptor positive (HCC) 11/02/2018   Liver nodule 11/11/2018   Abnormal CT of the abdomen 11/11/2018   Liver lesion 11/13/2018   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   Sigmoid thickening 11/13/2018   Bladder mass 11/13/2018   Thyroid nodule 11/13/2018   Enlarged lymph nodes 11/13/2018   Port-A-Cath in place 12/29/2018   Stress 08/28/2020   Tachycardia 08/28/2020   RBBB 05/13/2022   Anxiety 07/09/2022   Asthma 07/09/2022   Breast cancer (HCC) 07/09/2022   Bronchitis 07/09/2022   Personal history of chemotherapy 12/01/2012   Personal history of radiation therapy 07/09/2022   PONV (postoperative nausea and vomiting) 07/09/2022   Seasonal allergies 07/09/2022   GERD (gastroesophageal reflux disease) 07/09/2022   Irritable bowel syndrome (  IBS) 07/09/2022   Coronary artery calcification 07/10/2022   Palpitations 07/10/2022   Osteopenia 12/20/2022   Class 1 obesity due to excess calories without serious comorbidity with body mass index (BMI) of 30.0 to 30.9 in adult 02/27/2023   Hammertoe of right foot 03/18/2023   Right foot pain 03/18/2023   Colitis presumed infectious 04/29/2023   Diarrhea 04/29/2023   GIB (gastrointestinal bleeding) 04/29/2023   History of breast cancer in adulthood 04/29/2023   Hypokalemia  04/29/2023   Hypomagnesemia 05/14/2023   Change in bowel habits 10/08/2023   Overweight (BMI 25.0-29.9) 12/01/2023   History of obesity 12/02/2023   Resolved Ambulatory Problems    Diagnosis Date Noted   Generalized anxiety disorder 12/01/2012   No Additional Past Medical History     ROS See HPI.    Objective:     BP 119/80   Pulse 73   Ht 5\' 6"  (1.676 m)   Wt 181 lb (82.1 kg)   SpO2 99%   BMI 29.21 kg/m  BP Readings from Last 3 Encounters:  12/01/23 119/80  06/02/23 134/79  05/08/23 118/72   Wt Readings from Last 3 Encounters:  12/01/23 181 lb (82.1 kg)  06/02/23 186 lb (84.4 kg)  05/08/23 183 lb 1.3 oz (83 kg)      Physical Exam Constitutional:      Appearance: Normal appearance.  Cardiovascular:     Rate and Rhythm: Normal rate.     Pulses: Normal pulses.  Pulmonary:     Effort: Pulmonary effort is normal.  Neurological:     General: No focal deficit present.     Mental Status: She is alert and oriented to person, place, and time.  Psychiatric:        Mood and Affect: Mood normal.      The 10-year ASCVD risk score (Arnett DK, et al., 2019) is: 3.9%    Assessment & Plan:  Debbie [name] was seen today for medical management of chronic issues.  Diagnoses and all orders for this visit:  Overweight (BMI 25.0-29.9) -     phentermine (ADIPEX-P) 37.5 MG tablet; One tab by mouth qAM  History of obesity -     phentermine (ADIPEX-P) 37.5 MG tablet; One tab by mouth qAM  Essential hypertension, benign   Vitals and HR look great today  .Debbie AasDiscussed low carb diet with 1500 calories and 80g of protein.  Exercising at least 150 minutes a week.  [employer] could be a Chief Technology Officer.  Discussed weight loss medication options Taper down off contrave over the next 2 weeks by going to 1 tablet bid and then 1 tablet every day then stop Failed contrave, wegovy, zepbound Discussed slenderiiz drops OTC that are natural for weight loss and stress  balancing Discussed phentermine and risk and side effects Start with 1/2 tablet daily in the morning    Return in about 3 months (around 03/01/2024).   Spent 30 minutes with patient in chart review and discussion of weight loss medications.    Debbie Zody, PA-C

## 2023-12-01 NOTE — Patient Instructions (Addendum)
 Consider weight watchers Slenderiiz drops [email or web address]  Or just google [email or web address]

## 2023-12-02 ENCOUNTER — Encounter: Payer: Self-pay | Admitting: Hematology and Oncology

## 2023-12-02 ENCOUNTER — Other Ambulatory Visit (HOSPITAL_COMMUNITY): Payer: Self-pay

## 2023-12-02 ENCOUNTER — Inpatient Hospital Stay: Payer: BC Managed Care – PPO | Attending: Hematology and Oncology | Admitting: Hematology and Oncology

## 2023-12-02 VITALS — BP 131/75 | HR 71 | Temp 97.8°F | Resp 16 | Wt 181.6 lb

## 2023-12-02 DIAGNOSIS — C50211 Malignant neoplasm of upper-inner quadrant of right female breast: Secondary | ICD-10-CM | POA: Insufficient documentation

## 2023-12-02 DIAGNOSIS — E042 Nontoxic multinodular goiter: Secondary | ICD-10-CM | POA: Diagnosis not present

## 2023-12-02 DIAGNOSIS — Z9221 Personal history of antineoplastic chemotherapy: Secondary | ICD-10-CM | POA: Insufficient documentation

## 2023-12-02 DIAGNOSIS — Z8639 Personal history of other endocrine, nutritional and metabolic disease: Secondary | ICD-10-CM

## 2023-12-02 DIAGNOSIS — Z79810 Long term (current) use of selective estrogen receptor modulators (SERMs): Secondary | ICD-10-CM | POA: Insufficient documentation

## 2023-12-02 DIAGNOSIS — Z170 Estrogen receptor positive status [ER+]: Secondary | ICD-10-CM | POA: Insufficient documentation

## 2023-12-02 DIAGNOSIS — Z1722 Progesterone receptor negative status: Secondary | ICD-10-CM | POA: Diagnosis not present

## 2023-12-02 DIAGNOSIS — Z923 Personal history of irradiation: Secondary | ICD-10-CM | POA: Insufficient documentation

## 2023-12-02 HISTORY — DX: Personal history of other endocrine, nutritional and metabolic disease: Z86.39

## 2023-12-02 NOTE — Progress Notes (Signed)
 No meds had [facility]  [phone] [phone]    ID: Debbie [name] DOB: 1960/11/24  MR#: [id number]  ZHY#:[id number]  Patient Care Team: Nolene Ebbs as PCP - General (Family Medicine) Ovidio Kin, MD as Consulting Physician (General Surgery) Antony Blackbird, MD as Consulting Physician (Radiation Oncology) Burden, Eli Phillips, MD as Referring Physician (Ophthalmology) Laurey Morale, MD as Consulting Physician (Cardiology) Pershing Proud, RN as Oncology Nurse Navigator Donnelly Angelica, RN as Oncology Nurse Navigator Rachel Moulds, MD as Consulting Physician (Hematology and Oncology) OTHER MD: Corky Sox, MD (Ophthalmology)  CHIEF COMPLAINT: Estrogen receptor positive breast cancer  CURRENT TREATMENT: tamoxifen  INTERVAL HISTORY:  Debbie [name] returns today for follow-up of her HER-2 positive breast cancer.  Discussed the use of AI scribe software for clinical note transcription with the patient, who gave verbal consent to proceed.  History of Present Illness Debbie [name] is a 63 year old female with breast cancer on tamoxifen therapy who presents for routine follow-up.  She has been on tamoxifen since January 2021 and is expected to continue until January 2026. She reports no side effects from the medication. She notes an increase in breast sensation, which she describes as 'finally' returning after five years.  She underwent a thyroid ultrasound in May 2024, with a recommendation for annual follow-ups for five years. She is due for another ultrasound.  She exercises four days a week and has recently started taking omeprazole for heartburn. No bleeding, leg swelling, or shortness of breath. Occasional finger swelling is noted, but it is not present today.  She has arthritis in her hands, with multiple fingers affected. She recalls having an x-ray that confirmed the condition.  She experiences occasional tics, which  she associates with stress and lack of sleep. She is supposed to take a multivitamin but has not been consistent.  Rest of the pertinent 10 point ROS reviewed and negative  REVIEW OF SYSTEMS: A detailed review of systems today was otherwise stable.   COVID 19 VACCINATION STATUS: full vaccinated Proofreader) x2, no booster as of December 2022  HISTORY OF CURRENT ILLNESS: From the original intake note:  Debbie [name] had routine screening mammography on 10/21/2018 showing a possible abnormality in the right breast. She felt a lump in her breast, possibly dating back to 01/2018, but she didn't think anything about it due to her history with fibrous breasts; she has had fibrous breasts all her life and began to have mammograms at the age of 9.   She underwent right breast ultrasonography at [facility] on 10/28/2018 showing: On physical exam, a firm, fixed mass is palpated in the superior right breast. Targeted ultrasound is performed, showing an irregular hypoechoic mass at the 1 o'clock position 4 cm from the nipple. It measures 2.8 x 2.4 x 1.8 cm. There is associated peripheral and internal vascularity. An oval, circumscribed hypoechoic mass is also identified within the vicinity at the 1 o'clock position 4 cm from the nipple. It measures 1.0 x 0.9 x 0.4 cm. This corresponds with an additional mammographically identified mass in the upper inner quadrant at middle depth. This is mammographically stable dating back to at least 2014. Additional stable, circumscribed masses are scattered throughout the bilateral breasts mammographically. Evaluation of the right axilla demonstrates a single, markedly abnormal lymph node with cortical thickening up to 1 cm. No additional suspicious lymphadenopathy is identified.  Accordingly on 10/28/2018 she proceeded to biopsy of the right breast area in  question. The pathology from this procedure showed ([id number]): invasive ductal carcinoma, nottingham grade II  of III, 2.8 cm, 1 o'clock, 4.0 cm from the nipple. Prognostic indicators significant for: estrogen receptor, 90% positive, with strong staining intensity and progesterone receptor, 0% negative. Proliferation marker Ki67 at 20%. HER2 Positive (3+) by immunohistochemistry.   An additional biopsy of the right axilla was performed on the same day ([id number]) showing:  2. Lymph node, needle/core biopsy, inferior right axilla - Tumor cells within fragmented nodal tissue  The patient's subsequent history is as detailed above.   PAST MEDICAL HISTORY: Past Medical History:  Diagnosis Date   Abnormal CT of the abdomen 11/11/2018   Anxiety    Asthma    with severe URIs   Bilateral hand pain 01/23/2018   Bladder mass 11/13/2018   Breast cancer (HCC)    Bronchitis    Chest tightness 07/09/2017   Chronic shoulder pain 06/26/2016   Class 1 obesity due to excess calories with serious comorbidity and body mass index (BMI) of 30.0 to 30.9 in adult 03/05/2013   Dr. Cathey Endow: Qsymia started July 2014, sleep study ordered.   Class 1 obesity due to excess calories without serious comorbidity with body mass index (BMI) of 30.0 to 30.9 in adult 02/27/2023   Colitis presumed infectious 04/29/2023   Coronary artery calcification 07/10/2022   Cough 07/09/2017   Diarrhea 04/29/2023   Elevated LFTs 05/11/2013   Liver biopsy normal spring 2015   Enlarged lymph nodes 11/13/2018   Essential hypertension, benign 12/01/2012   Family history of colon cancer    Family history of stroke 04/29/2018   Fungal infection of toenail 01/17/2017   Gastritis 02/17/2013   GERD (gastroesophageal reflux disease)    GIB (gastrointestinal bleeding) 04/29/2023   Hammertoe of right foot 03/18/2023   Hand joint stiff, unspecified laterality 01/23/2018   History of breast cancer in adulthood 04/29/2023   History of hysterectomy 01/04/2013   History of shingles 01/04/2013   Age 34   Hypokalemia 04/29/2023   Hypotension  04/29/2018   Irritable bowel syndrome (IBS)    "had for years," per patient   Labral tear of shoulder 08/29/2014   Large breasts 06/26/2016   Liver lesion 11/13/2018   Liver nodule 11/11/2018   Malignant neoplasm of upper-inner quadrant of right breast in female, 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 IgG labs 2019.   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   No energy 10/20/2017   Nocturnal hypoxemia 11/23/2017   Non-restorative sleep 10/20/2017   OSA (obstructive sleep apnea) 11/23/2017   Osteopenia 12/20/2022   Palpitations 07/10/2022   Periodic limb movement 11/23/2017   Personal history of chemotherapy    Personal history of radiation therapy    PONV (postoperative nausea and vomiting)    Port-A-Cath in place 12/29/2018   Positive ANA (antinuclear antibody) 02/06/2018   Primary insomnia 06/26/2016   Primary osteoarthritis of both feet 04/29/2018   Primary osteoarthritis of both hands 04/29/2018   RBBB 05/13/2022   Reactive airway disease, mild intermittent, uncomplicated 10/21/2017   Rectocele 10/20/2017   Right foot pain 03/18/2023   Seasonal allergies    Sigmoid thickening 11/13/2018   Snoring 10/20/2017   SOB (shortness of breath) 07/09/2017   Stress 08/28/2020   Tachycardia 08/28/2020   Thyroid nodule 11/13/2018    PAST SURGICAL HISTORY: Past Surgical History:  Procedure Laterality Date   ANKLE SURGERY  BREAST CYST EXCISION Bilateral    BREAST EXCISIONAL BIOPSY Bilateral    BREAST LUMPECTOMY Right 03/2019   BREAST LUMPECTOMY Left    benign. done around age 19   BREAST LUMPECTOMY WITH RADIOACTIVE SEED AND AXILLARY LYMPH NODE DISSECTION Right 04/15/2019   Procedure: RIGHT BREAST LUMPECTOMY WITH RADIOACTIVE SEED AND  TARGETED RIGHT AXILLARY LYMPH NODE DISSECTION WITH RADIOACTIVE SEED;  Surgeon: Ovidio Kin, MD;  Location: [facility];  Service: General;  Laterality: Right;   CHOLECYSTECTOMY  2012   KNEE ARTHROSCOPY W/ MEDIAL COLLATERAL LIGAMENT (MCL) REPAIR Left 10/07/2012   PORTACATH PLACEMENT N/A 11/16/2018   Procedure: INSERTION PORT-A-CATH WITH ULTRASOUND;  Surgeon: Ovidio Kin, MD;  Location: [facility];  Service: General;  Laterality: N/A;   REDUCTION MAMMAPLASTY Bilateral 11/17/2020   TONSILLECTOMY  1969   TOTAL ABDOMINAL HYSTERECTOMY  2011   with vaginal sling    FAMILY HISTORY: Family History  Problem Relation Age of Onset   Hypertension Mother    Rheum arthritis Mother    Heart failure Mother    Hypertension Father    Alcoholism Father    Colon cancer Father 76   Lung cancer Father    Depression Father    Diabetes Father    Skin cancer Father    Hypertension Brother    Stroke Brother    Atrial fibrillation Brother    Hyperlipidemia Maternal Grandfather    Stroke Paternal Grandmother    Skin cancer Paternal Grandmother    Healthy Daughter    Healthy Daughter    Breast cancer Neg Hx    Ovarian cancer Neg Hx    Prostate cancer Neg Hx    Pancreatic cancer Neg Hx   Nakari's father died from lung cancer at age 46. Patients' mother died from congestive heart failure at age 61. The patient has 1 brother. Patient denies anyone in her family having breast, ovarian, prostate, or pancreatic cancer. Demitria's father was diagnosed with skin cancer, colon cancer, and lung cancer. [name]'s paternal grandmother was diagnosed with skin cancer.    GYNECOLOGIC HISTORY:  No LMP recorded. Patient has had a hysterectomy. 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 Her husband, Inge Waldroup, is in [employer]. They have two daughters, Jodelle Green and  Olustee. [address] Clare Gandy is 89, lives in [place], [place], and works in Special educational needs teacher. Jules Husbands [place] is 26, lives in [place], [place], and is getting married in 2021   ADVANCED DIRECTIVES: In the absence of any documentation, Khamiya's spouse, Harvie Heck, is her healthcare power of attorney.      HEALTH MAINTENANCE: Social History   Tobacco Use   Smoking status: Never   Smokeless tobacco: Never  Vaping Use   Vaping status: Never Used  Substance Use Topics   Alcohol use: Yes    Comment: rare   Drug use: No    Colonoscopy: yes, 2015, [facility]   PAP: 2011  Bone density: 07/2019, -1.8 Mammography: 10/21/2018  Allergies  Allergen Reactions   Erythromycin Itching and Rash   Penicillins Swelling, Rash and Hives    Did it involve swelling of the face/tongue/throat, SOB, or low BP? Yes Did it involve sudden or severe rash/hives, skin peeling, or any reaction on the inside  of your mouth or nose? No Did you need to seek medical attention at a hospital or doctor's office? Yes When did it last happen?      40+ years ago If all above answers are "NO", may proceed with cephalosporin use.  Did it involve swelling of the face/tongue/throat, SOB, or low BP? Yes Did it involve sudden or severe rash/hives, skin peeling, or any reaction on the inside of your mouth or nose? No Did you need to seek medical attention at a hospital or doctor's office? Yes When did it last happen?      40+ years ago If all above answers are "NO", may proceed with cephalosporin use. Did it involve swelling of the face/tongue/throat, SOB, or low BP? Yes Did it involve sudden or severe rash/hives, skin peeling, or any reaction on the inside of your mouth or nose? No Did you need to seek medical attention at a hospital or doctor's office? Yes When did it last happen?      40+ years ago If all above answers are "NO", may proceed with cephalosporin use.   Azithromycin Diarrhea    Erythromycin Base Itching   Levofloxacin     Tendon pain   Mounjaro [Tirzepatide]     GI side effects.    Semaglutide     GI side effects   Penicillin G Rash, Swelling and Hives    Current Outpatient Medications  Medication Sig Dispense Refill   albuterol (PROAIR HFA) 108 (90 Base) MCG/ACT inhaler Inhale 2 puffs into the lungs every 4 (four) hours as needed for wheezing or shortness of breath. 20.1 g 0   AMBULATORY NON FORMULARY MEDICATION Continuous positive airway pressure (CPAP) machine set at autopap, with all supplemental supplies as needed. 1 each 0   calcium-vitamin D (OSCAL WITH D) 500-200 MG-UNIT tablet Take 1 tablet by mouth daily with breakfast.     losartan (COZAAR) 100 MG tablet Take 1 tablet (100 mg total) by mouth daily. 90 tablet 3   metoprolol tartrate (LOPRESSOR) 25 MG tablet Take one tablet as needed for palpitations or increased heart rate. Ok to take up to twice a day. 90 tablet 3   Multiple Vitamin (MULTIVITAMIN WITH MINERALS) TABS tablet Take 1 tablet by mouth daily.     omeprazole (PRILOSEC) 40 MG capsule TAKE 1 CAPSULE(40 MG) BY MOUTH DAILY 90 capsule 1   phentermine (ADIPEX-P) 37.5 MG tablet One tab by mouth qAM 90 tablet 0   tamoxifen (NOLVADEX) 20 MG tablet Take 1 tablet (20 mg total) by mouth daily. 90 tablet 4   valACYclovir (VALTREX) 1000 MG tablet TAKE 1 TABLET TWICE A DAY AS NEEDED FOR COLD SORE 60 tablet 11   venlafaxine XR (EFFEXOR-XR) 75 MG 24 hr capsule Take 1 capsule (75 mg total) by mouth daily with breakfast. 90 capsule 3   No current facility-administered medications for this visit.    OBJECTIVE:  white woman who appears well  Vitals:   12/02/23 1259  BP: 131/75  Pulse: 71  Resp: 16  Temp: 97.8 F (36.6 C)  SpO2: 97%     Body mass index is 29.31 kg/m.   Wt Readings from Last 3 Encounters:  12/02/23 181 lb 9.6 oz (82.4 kg)  12/01/23 181 lb (82.1 kg)  06/02/23 186 lb (84.4 kg)  ECOG FS:1 - Symptomatic but completely  ambulatory  Physical Exam Constitutional:      Appearance: Normal appearance.  Cardiovascular:     Rate and Rhythm: Normal rate and regular rhythm.  Chest:     Comments: Bilateral breasts with excellent cosmetic outcome.  No definitive palpable masses . No regional adenopathy.  Musculoskeletal:     Cervical back: Normal range of motion and neck supple. No rigidity.  Lymphadenopathy:     Cervical: No cervical adenopathy.  Neurological:     Mental Status: She is alert.       LAB RESULTS:  CMP     Component Value Date/Time   NA 144 05/15/2023 0930   K 4.1 05/15/2023 0930   CL 108 (H) 05/15/2023 0930   CO2 25 05/15/2023 0930   GLUCOSE 90 05/15/2023 0930   GLUCOSE 93 05/30/2022 1235   BUN 15 05/15/2023 0930   CREATININE 0.69 05/15/2023 0930   CREATININE 0.67 05/30/2022 1235   CREATININE 0.76 05/13/2022 0000   CALCIUM 8.9 05/15/2023 0930   PROT 5.8 (L) 05/15/2023 0930   ALBUMIN 4.1 05/15/2023 0930   AST 18 05/15/2023 0930   AST 17 05/30/2022 1235   ALT 17 05/15/2023 0930   ALT 12 05/30/2022 1235   ALKPHOS 65 05/15/2023 0930   BILITOT 0.6 05/15/2023 0930   BILITOT 0.7 05/30/2022 1235   GFRNONAA >60 05/30/2022 1235   GFRNONAA 90 08/25/2020 0000   GFRAA 104 08/25/2020 0000    No results found for: "TOTALPROTELP", "ALBUMINELP", "A1GS", "A2GS", "BETS", "BETA2SER", "GAMS", "MSPIKE", "SPEI"  No results found for: "KPAFRELGTCHN", "LAMBDASER", "KAPLAMBRATIO"  Lab Results  Component Value Date   WBC 5.3 05/15/2023   NEUTROABS 3.5 05/15/2023   HGB 13.4 05/15/2023   HCT 40.2 05/15/2023   MCV 91 05/15/2023   PLT 272 05/15/2023    No results found for: "LABCA2"  No components found for: "ZOXWRU045"  No results for input(s): "INR" in the last 168 hours.  No results found for: "LABCA2"  No results found for: "WUJ811"  No results found for: "CAN125"  No results found for: "CAN153"  No results found for: "CA2729"  No components found for: "HGQUANT"  No  results found for: "CEA1", "CEA" / No results found for: "CEA1", "CEA"   No results found for: "AFPTUMOR"  No results found for: "CHROMOGRNA"  No results found for: "HGBA", "HGBA2QUANT", "HGBFQUANT", "HGBSQUAN" (Hemoglobinopathy evaluation)   No results found for: "LDH"  Lab Results  Component Value Date   IRON 46 05/11/2013   TIBC 254 05/11/2013   IRONPCTSAT 18 (L) 05/11/2013   (Iron and TIBC)  Lab Results  Component Value Date   FERRITIN 72 05/11/2013    Urinalysis    Component Value Date/Time   BILIRUBINUR Small 11/09/2018 0914   PROTEINUR Negative 11/09/2018 0914   UROBILINOGEN 0.2 11/09/2018 0914   NITRITE Negative 11/09/2018 0914   LEUKOCYTESUR Negative 11/09/2018 0914    STUDIES:  MM 3D DIAGNOSTIC MAMMOGRAM BILATERAL BREAST Result Date: 11/19/2023 CLINICAL DATA:  History of right breast cancer status post lumpectomy in 2020. Patient had bilateral reduction mammoplasty in 2022. EXAM: DIGITAL DIAGNOSTIC BILATERAL MAMMOGRAM WITH TOMOSYNTHESIS AND CAD TECHNIQUE: Bilateral digital diagnostic mammography and breast tomosynthesis was performed. The images were evaluated with computer-aided detection. COMPARISON:  Previous exam(s). ACR Breast Density Category c: The breasts are heterogeneously dense, which may obscure small masses. FINDINGS: Stable lumpectomy changes are seen in the right breast. Stable changes from reduction mammoplasty seen bilaterally. No suspicious mass or malignant type microcalcifications seen in either breast. IMPRESSION: No evidence of malignancy either breast. RECOMMENDATION: Bilateral diagnostic mammogram in 1 year is recommended. I have discussed the findings and recommendations with the patient. If applicable, a  reminder letter will be sent to the patient regarding the next appointment. BI-RADS CATEGORY  2: Benign. Electronically Signed   By: Baird Lyons M.D.   On: 11/19/2023 11:49     ELIGIBLE FOR AVAILABLE RESEARCH PROTOCOL: no   ASSESSMENT: 63  y.o. Kathryne Sharper, [place] woman status post right breast upper inner quadrant biopsy 10/28/2018 for a clinical T2 N1, stage IIA invasive ductal carcinoma, grade 2, estrogen receptor positive, progesterone receptor negative, HER-2 amplified, with an MIB-1 of 20%  (a) staging work-up with CT scans of the chest abdomen and pelvis and thyroid ultrasonography shows multiple findings that will require follow-up:   (i) two 0.5 cm liver lesions   (ii) possible bladder mass, thickened sigmoid and rectum, and prominent adnexa   (iii) thyroid nodule biopsied 11/19/2018, read as [place] 3 (risk of malignancy 5-15%)   (iiii)  thyroid lymph node biopsied 11/19/2018 showing no evidence of carcinoma   (iiiii) abd MRI 05/07 2020 confirms two tiny liver lesions. No bladder or adnexal malignancy  (1) neoadjuvant chemotherapy consisting of carboplatin, docetaxel, trastuzumab, and pertuzumab every 21 days x 6 started 11/17/2018, copleted 03/02/2019  (a) pertuzumab discontinued after cycle 1 due to severe diarrhea  (2) trastuzumab continued to complete 1 year (last dose 10/19/2019).).  (a) baseline echocardiogram 11/11/2018 showed an ejection fraction in the 55-60% range  (b) echocardiogram 03/10/2019 shows EF of 60-65%  (c) echo 06/08/2019 shows EF 60%  (d) echo 09/14/2018 shows an ejection fraction in the 60-65% range.  (e) echo 12/14/2019 shows an ejection fraction in the 60-65% range  (3) Right breast lumpectomy on 04/15/2019: no residual carcinoma (ypT0 ypN0)  (a) a total of 4 lymph nodes were removed  (4) adjuvant radiation 05/31/2019 - 07/14/2019 Site Technique Total Dose (Gy) Dose per Fx (Gy) Completed Fx Beam Energies  Breast: Breast_Rt 3D 50.4/50.4 1.8 28/28 6X, 10X  Breast: Breast_Rt_axilla 3D 45/45 1.8 25/25 6X, 10X   (5) started tamoxifen 08/20/2019  (a) s/p hysterectomy  (b) bone density 07/30/2019 shows a T score of -1.8   (c) used oral contraceptives for 7 or 8 years in the past with no  complications  (6) thyroid nodules followed by Dr Darnell Level  (7) very small liver abnormalities noted on CT March 2020 and MRI May 2020 require follow-up   (a) repeat liver MRI 06/06/2020 shows these to be small harmartomas or cysts   PLAN:  Assessment & Plan Breast cancer On tamoxifen since January 2021, with no side effects. Mammogram normal. Prefers specialist follow-up. - Continue tamoxifen until January 2026. - Schedule follow-up in one year. - Advise to contact clinic if issues arise.  Thyroid nodule Annual follow-up recommended for five years starting May 2024. - Order thyroid ultrasound. - Schedule annual thyroid ultrasound for five years.  Arthritis Arthritis in hands with extra bone formation and occasional swelling.  Heartburn Started on omeprazole.  Tics Tics related to stress and lack of sleep. - Recommend taking a multivitamin.   Total time spent: 30 minutes  *Total Encounter Time as defined by the Centers for Medicare and Medicaid Services includes, in addition to the face-to-face time of a patient visit (documented in the note above) non-face-to-face time: obtaining and reviewing outside history, ordering and reviewing medications, tests or procedures, care coordination (communications with other health care professionals or caregivers) and documentation in the medical record.

## 2023-12-22 ENCOUNTER — Other Ambulatory Visit: Payer: Self-pay | Admitting: Medical Genetics

## 2024-01-08 ENCOUNTER — Ambulatory Visit (HOSPITAL_COMMUNITY)
Admission: RE | Admit: 2024-01-08 | Discharge: 2024-01-08 | Disposition: A | Discharge status: Home / Self Care | Source: Ambulatory Visit | Attending: Hematology and Oncology | Admitting: Hematology and Oncology

## 2024-01-08 DIAGNOSIS — C50211 Malignant neoplasm of upper-inner quadrant of right female breast: Secondary | ICD-10-CM | POA: Insufficient documentation

## 2024-01-08 DIAGNOSIS — Z170 Estrogen receptor positive status [ER+]: Secondary | ICD-10-CM | POA: Diagnosis not present

## 2024-01-08 DIAGNOSIS — E042 Nontoxic multinodular goiter: Secondary | ICD-10-CM | POA: Diagnosis not present

## 2024-01-09 ENCOUNTER — Other Ambulatory Visit: Payer: Self-pay | Admitting: Hematology and Oncology

## 2024-01-09 DIAGNOSIS — E041 Nontoxic single thyroid nodule: Secondary | ICD-10-CM

## 2024-01-09 NOTE — Progress Notes (Signed)
 Thyroid  nodule stable, repeat US  ordered. Discussed results with pt.

## 2024-01-16 ENCOUNTER — Other Ambulatory Visit (HOSPITAL_COMMUNITY)
Admission: RE | Admit: 2024-01-16 | Discharge: 2024-01-16 | Disposition: A | Discharge status: Home / Self Care | Payer: Self-pay | Source: Ambulatory Visit | Attending: Medical Genetics | Admitting: Medical Genetics

## 2024-01-21 ENCOUNTER — Ambulatory Visit: Attending: Cardiology | Admitting: Cardiology

## 2024-01-21 ENCOUNTER — Encounter: Payer: Self-pay | Admitting: Cardiology

## 2024-01-21 VITALS — BP 110/66 | HR 84 | Ht 66.0 in | Wt 180.1 lb

## 2024-01-21 DIAGNOSIS — I2510 Atherosclerotic heart disease of native coronary artery without angina pectoris: Secondary | ICD-10-CM | POA: Diagnosis not present

## 2024-01-21 DIAGNOSIS — I10 Essential (primary) hypertension: Secondary | ICD-10-CM | POA: Diagnosis not present

## 2024-01-21 DIAGNOSIS — Z01818 Encounter for other preprocedural examination: Secondary | ICD-10-CM

## 2024-01-21 DIAGNOSIS — G4733 Obstructive sleep apnea (adult) (pediatric): Secondary | ICD-10-CM

## 2024-01-21 DIAGNOSIS — Z01810 Encounter for preprocedural cardiovascular examination: Secondary | ICD-10-CM | POA: Diagnosis not present

## 2024-01-21 NOTE — Progress Notes (Signed)
 Cardiology Office Note:    Date:  01/21/2024   ID:  Debbie [name], DOB June 26, 1961, MRN [id number]  PCP:  Araceli Knight, PA-C  Cardiologist:  Nelia Balzarine, MD   Referring MD: Araceli Knight, PA-C    ASSESSMENT:    1. Preop examination   2. Coronary artery calcification   3. Essential hypertension, benign   4. OSA (obstructive sleep apnea)    PLAN:    In order of problems listed above:  Coronary artery calcification found on CT scan in the remote past: Secondary prevention stressed to the patient.  Importance of compliance with diet medication stressed and she vocalized understanding.  She was advised to walk at least half an hour a day on a daily basis and she promises to do so. Mixed dyslipidemia: She will be back in the morning for blood work and she will need statin therapy.  It is not clear to me why she is not on statins.  Her lipids from last evaluation are fine but we will reinitiate this for her.  She is agreeable.  Benefits risks explained. Essential hypertension: Blood pressure is stable and diet was emphasized.  Lifestyle modification urged. She request that the blood work so we will do a complete blood work including hemoglobin A1c and she gives history of vitamin D  deficiency so we will do a screening test also. Patient will be seen in follow-up appointment in 6 months or earlier if the patient has any concerns.    Medication Adjustments/Labs and Tests Ordered: Current medicines are reviewed at length with the patient today.  Concerns regarding medicines are outlined above.  Orders Placed This Encounter  Procedures   EKG 12-Lead   No orders of the defined types were placed in this encounter.    No chief complaint on file.    History of Present Illness:    Debbie [name] is a 63 y.o. female.  Patient has past medical history of coronary artery calcification, essential hypertension.  She denies any problems at this time and takes care of  activities of daily living.  No chest pain orthopnea or PND.  She exercises on a regular basis.  At the time of my evaluation, the patient is alert awake oriented and in no distress.  Past Medical History:  Diagnosis Date   Abnormal CT of the abdomen 11/11/2018   Anxiety    Asthma    with severe URIs   Bilateral hand pain 01/23/2018   Bladder mass 11/13/2018   Breast cancer (HCC)    Bronchitis    Change in bowel habits 10/08/2023   Chest tightness 07/09/2017   Chronic shoulder pain 06/26/2016   Class 1 obesity due to excess calories with serious comorbidity and body mass index (BMI) of 30.0 to 30.9 in adult 03/05/2013   Dr. Ambrosio Junker: Qsymia started July 2014, sleep study ordered.   Class 1 obesity due to excess calories without serious comorbidity with body mass index (BMI) of 30.0 to 30.9 in adult 02/27/2023   Colitis presumed infectious 04/29/2023   Coronary artery calcification 07/10/2022   Cough 07/09/2017   Diarrhea 04/29/2023   Elevated LFTs 05/11/2013   Liver biopsy normal spring 2015   Enlarged lymph nodes 11/13/2018   Essential hypertension, benign 12/01/2012   Family history of colon cancer    Family history of stroke 04/29/2018   Fungal infection of toenail 01/17/2017   Gastritis 02/17/2013   GERD (gastroesophageal reflux disease)    GIB (gastrointestinal bleeding) 04/29/2023  Hammertoe of right foot 03/18/2023   Hand joint stiff, unspecified laterality 01/23/2018   History of breast cancer in adulthood 04/29/2023   History of hysterectomy 01/04/2013   History of obesity 12/02/2023   History of shingles 01/04/2013   Age 19   Hypokalemia 04/29/2023   Hypomagnesemia 05/14/2023   Hypotension 04/29/2018   Irritable bowel syndrome (IBS)    "had for years," per patient   Labral tear of shoulder 08/29/2014   Large breasts 06/26/2016   Liver lesion 11/13/2018   Liver nodule 11/11/2018   Malignant neoplasm of upper-inner quadrant of right breast in female, estrogen  receptor positive (HCC) 11/02/2018   Mid back pain 06/26/2016   Multiple food allergies 07/20/2018   Wheat/casein/corn/peanuts. IgG labs 2019.   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   No energy 10/20/2017   Nocturnal hypoxemia 11/23/2017   Non-restorative sleep 10/20/2017   OSA (obstructive sleep apnea) 11/23/2017   Osteopenia 12/20/2022   Overweight (BMI 25.0-29.9) 12/01/2023   Palpitations 07/10/2022   Periodic limb movement 11/23/2017   Personal history of chemotherapy    Personal history of radiation therapy    PONV (postoperative nausea and vomiting)    Port-A-Cath in place 12/29/2018   Positive ANA (antinuclear antibody) 02/06/2018   Primary insomnia 06/26/2016   Primary osteoarthritis of both feet 04/29/2018   Primary osteoarthritis of both hands 04/29/2018   RBBB 05/13/2022   Reactive airway disease, mild intermittent, uncomplicated 10/21/2017   Rectocele 10/20/2017   Right foot pain 03/18/2023   Seasonal allergies    Sigmoid thickening 11/13/2018   Snoring 10/20/2017   SOB (shortness of breath) 07/09/2017   Stress 08/28/2020   Tachycardia 08/28/2020   Thyroid  nodule 11/13/2018    Past Surgical History:  Procedure Laterality Date   ANKLE SURGERY     BREAST CYST EXCISION Bilateral    BREAST EXCISIONAL BIOPSY Bilateral    BREAST LUMPECTOMY Right 03/2019   BREAST LUMPECTOMY Left    benign. done around age 44   BREAST LUMPECTOMY WITH RADIOACTIVE SEED AND AXILLARY LYMPH NODE DISSECTION Right 04/15/2019   Procedure: RIGHT BREAST LUMPECTOMY WITH RADIOACTIVE SEED AND  TARGETED RIGHT AXILLARY LYMPH NODE DISSECTION WITH RADIOACTIVE SEED;  Surgeon: Juanita Norlander, MD;  Location: [facility];  Service: General;  Laterality: Right;   CHOLECYSTECTOMY  2012   KNEE ARTHROSCOPY W/ MEDIAL COLLATERAL LIGAMENT (MCL) REPAIR Left 10/07/2012   PORTACATH PLACEMENT N/A 11/16/2018   Procedure: INSERTION PORT-A-CATH WITH ULTRASOUND;  Surgeon: Juanita Norlander, MD;  Location: [facility];  Service: General;  Laterality: N/A;   REDUCTION MAMMAPLASTY Bilateral 11/17/2020   TONSILLECTOMY  1969   TOTAL ABDOMINAL HYSTERECTOMY  2011   with vaginal sling    Current Medications: Current Meds  Medication Sig   albuterol  (PROAIR  HFA) 108 (90 Base) MCG/ACT inhaler Inhale 2 puffs into the lungs every 4 (four) hours as needed for wheezing or shortness of breath.   AMBULATORY NON FORMULARY MEDICATION Continuous positive airway pressure (CPAP) machine set at autopap, with all supplemental supplies as needed.   calcium-vitamin D  (OSCAL WITH D) 500-200 MG-UNIT tablet Take 1 tablet by mouth daily with breakfast.   losartan  (COZAAR ) 100 MG tablet Take 1 tablet (100 mg total) by mouth daily.   metoprolol  tartrate (LOPRESSOR ) 25 MG tablet Take one tablet as needed for palpitations or increased heart rate. Ok to take up to twice a day.   Multiple Vitamin (MULTIVITAMIN WITH MINERALS) TABS tablet Take 1 tablet by mouth daily.  omeprazole  (PRILOSEC) 40 MG capsule TAKE 1 CAPSULE(40 MG) BY MOUTH DAILY   phentermine  (ADIPEX-P ) 37.5 MG tablet One tab by mouth qAM   tamoxifen  (NOLVADEX ) 20 MG tablet Take 1 tablet (20 mg total) by mouth daily.   valACYclovir  (VALTREX ) 1000 MG tablet TAKE 1 TABLET TWICE A DAY AS NEEDED FOR COLD SORE   venlafaxine  XR (EFFEXOR -XR) 75 MG 24 hr capsule Take 1 capsule (75 mg total) by mouth daily with breakfast.     Allergies:   Erythromycin, Penicillins, Azithromycin, Erythromycin base, Levofloxacin, Mounjaro  [tirzepatide ], Semaglutide , and Penicillin g   Social History   Socioeconomic History   Marital status: Married    Spouse name: Mont Antis   Number of children: 2   Years of education: Not on file   Highest education level: Bachelor's degree (e.g., BA, AB, BS)  Occupational History   Not on file  Tobacco Use   Smoking status: Never   Smokeless tobacco: Never  Vaping Use   Vaping status: Never Used  Substance and Sexual Activity    Alcohol use: Yes    Comment: rare   Drug use: No   Sexual activity: Not Currently  Other Topics Concern   Not on file  Social History Narrative   Not on file   Social Drivers of Health   Financial Resource Strain: Low Risk  (09/29/2023)   Overall Financial Resource Strain (CARDIA)    Difficulty of Paying Living Expenses: Not hard at all  Food Insecurity: No Food Insecurity (09/29/2023)   Hunger Vital Sign    Worried About Running Out of Food in the Last Year: Never true    Ran Out of Food in the Last Year: Never true  Transportation Needs: No Transportation Needs (09/29/2023)   PRAPARE - Administrator, Civil Service (Medical): No    Lack of Transportation (Non-Medical): No  Physical Activity: Sufficiently Active (09/29/2023)   Exercise Vital Sign    Days of Exercise per Week: 4 days    Minutes of Exercise per Session: 50 min  Stress: No Stress Concern Present (09/29/2023)   [employer] of Occupational Health - Occupational Stress Questionnaire    Feeling of Stress : Not at all  Social Connections: Moderately Isolated (09/29/2023)   Social Connection and Isolation Panel [NHANES]    Frequency of Communication with Friends and Family: More than three times a week    Frequency of Social Gatherings with Friends and Family: Twice a week    Attends Religious Services: Never    Diplomatic Services operational officer: No    Attends Engineer, structural: Not on file    Marital Status: Married     Family History: The patient's family history includes Alcoholism in her father; Atrial fibrillation in her brother; Colon cancer (age of onset: 51) in her father; Depression in her father; Diabetes in her father; Healthy in her daughter and daughter; Heart failure in her mother; Hyperlipidemia in her maternal grandfather; Hypertension in her brother, father, and mother; Lung cancer in her father; Rheum arthritis in her mother; Skin cancer in her father and paternal  grandmother; Stroke in her brother and paternal grandmother. There is no history of Breast cancer, Ovarian cancer, Prostate cancer, or Pancreatic cancer.  ROS:   Please see the history of present illness.    All other systems reviewed and are negative.  EKGs/Labs/Other Studies Reviewed:    The following studies were reviewed today: I discussed my findings with the patient at length  Recent Labs: 05/15/2023: ALT 17; BUN 15; Creatinine, Ser 0.69; Hemoglobin 13.4; Magnesium 2.0; Platelets 272; Potassium 4.1; Sodium 144  Recent Lipid Panel    Component Value Date/Time   CHOL 185 05/13/2022 0000   TRIG 111 05/13/2022 0000   HDL 88 05/13/2022 0000   CHOLHDL 2.1 05/13/2022 0000   VLDL 17 06/09/2019 1236   LDLCALC 78 05/13/2022 0000    Physical Exam:    VS:  BP 110/66   Pulse 84   Ht 5\' 6"  (1.676 m)   Wt 180 lb 1.9 oz (81.7 kg)   SpO2 97%   BMI 29.07 kg/m     Wt Readings from Last 3 Encounters:  01/21/24 180 lb 1.9 oz (81.7 kg)  12/02/23 181 lb 9.6 oz (82.4 kg)  12/01/23 181 lb (82.1 kg)     GEN: Patient is in no acute distress HEENT: Normal NECK: No JVD; No carotid bruits LYMPHATICS: No lymphadenopathy CARDIAC: Hear sounds regular, 2/6 systolic murmur at the apex. RESPIRATORY:  Clear to auscultation without rales, wheezing or rhonchi  ABDOMEN: Soft, non-tender, non-distended MUSCULOSKELETAL:  No edema; No deformity  SKIN: Warm and dry NEUROLOGIC:  Alert and oriented x 3 PSYCHIATRIC:  Normal affect   Signed, Nelia Balzarine, MD  01/21/2024 2:39 PM    [facility]

## 2024-01-21 NOTE — Patient Instructions (Signed)
 Medication Instructions:  Your physician recommends that you continue on your current medications as directed. Please refer to the Current Medication list given to you today.  *If you need a refill on your cardiac medications before your next appointment, please call your pharmacy*  Lab Work: Tomorrow: BMET, CBC, TSH, LFTs, Lipid panel, vitamin D , hgb A1c If you have labs (blood work) drawn today and your tests are completely normal, you will receive your results only by: MyChart Message (if you have MyChart) OR A paper copy in the mail If you have any lab test that is abnormal or we need to change your treatment, we will call you to review the results.  Follow-Up: At [facility], you and your health needs are our priority.  As part of our continuing mission to provide you with exceptional heart care, our providers are all part of one team.  This team includes your primary Cardiologist (physician) and Advanced Practice Providers or APPs (Physician Assistants and Nurse Practitioners) who all work together to provide you with the care you need, when you need it.  Your next appointment:   9 month(s)  Provider:   Hillis Lu, MD   We recommend signing up for the patient portal called "MyChart".  Sign up information is provided on this After Visit Summary.  MyChart is used to connect with patients for Virtual Visits (Telemedicine).  Patients are able to view lab/test results, encounter notes, upcoming appointments, etc.  Non-urgent messages can be sent to your provider as well.   To learn more about what you can do with MyChart, go to [email or web address].

## 2024-01-21 NOTE — Addendum Note (Signed)
 Addended by: Luwana Salvo on: 01/21/2024 03:09 PM   Modules accepted: Orders

## 2024-01-22 ENCOUNTER — Encounter: Payer: Self-pay | Admitting: Physician Assistant

## 2024-01-22 DIAGNOSIS — G4733 Obstructive sleep apnea (adult) (pediatric): Secondary | ICD-10-CM | POA: Diagnosis not present

## 2024-01-22 DIAGNOSIS — I2510 Atherosclerotic heart disease of native coronary artery without angina pectoris: Secondary | ICD-10-CM | POA: Diagnosis not present

## 2024-01-22 DIAGNOSIS — I10 Essential (primary) hypertension: Secondary | ICD-10-CM | POA: Diagnosis not present

## 2024-01-22 DIAGNOSIS — Z01818 Encounter for other preprocedural examination: Secondary | ICD-10-CM | POA: Diagnosis not present

## 2024-01-23 LAB — LIPID PANEL

## 2024-01-26 ENCOUNTER — Encounter: Payer: Self-pay | Admitting: Physician Assistant

## 2024-01-27 ENCOUNTER — Ambulatory Visit: Payer: Self-pay | Admitting: Cardiology

## 2024-01-27 LAB — GENECONNECT MOLECULAR SCREEN: Genetic Analysis Overall Interpretation: NEGATIVE

## 2024-01-29 DIAGNOSIS — R300 Dysuria: Secondary | ICD-10-CM | POA: Diagnosis not present

## 2024-01-30 ENCOUNTER — Encounter: Payer: Self-pay | Admitting: Physician Assistant

## 2024-01-30 ENCOUNTER — Ambulatory Visit: Admitting: Physician Assistant

## 2024-01-30 VITALS — BP 130/77 | HR 91 | Temp 98.2°F | Ht 66.0 in | Wt 177.0 lb

## 2024-01-30 DIAGNOSIS — I2510 Atherosclerotic heart disease of native coronary artery without angina pectoris: Secondary | ICD-10-CM

## 2024-01-30 DIAGNOSIS — N3001 Acute cystitis with hematuria: Secondary | ICD-10-CM

## 2024-01-30 DIAGNOSIS — R300 Dysuria: Secondary | ICD-10-CM

## 2024-01-30 DIAGNOSIS — M545 Low back pain, unspecified: Secondary | ICD-10-CM

## 2024-01-30 DIAGNOSIS — G8929 Other chronic pain: Secondary | ICD-10-CM

## 2024-01-30 LAB — POCT URINALYSIS DIP (CLINITEK)
Bilirubin, UA: NEGATIVE
Glucose, UA: NEGATIVE mg/dL
Nitrite, UA: NEGATIVE
POC PROTEIN,UA: NEGATIVE
Spec Grav, UA: >=1.03 — AB
Urobilinogen, UA: 0.2 U/dL
pH, UA: 5.5

## 2024-01-30 MED ORDER — NITROFURANTOIN MONOHYD MACRO 100 MG PO CAPS: 100.0000 mg | ORAL_CAPSULE | Freq: Two times a day (BID) | ORAL | 0 refills | Status: DC

## 2024-01-30 MED ORDER — ROSUVASTATIN CALCIUM 10 MG PO TABS: 10.0000 mg | ORAL_TABLET | Freq: Every day | ORAL | 3 refills | Status: AC

## 2024-01-30 NOTE — Progress Notes (Unsigned)
 Acute Office Visit  Subjective:     Patient ID: Debbie [name], female    DOB: [birth year], 63 y.o.   MRN: [id number]  Chief Complaint  Patient presents with   Medical Management of Chronic Issues    kindey - lower back pain    HPI Patient is in today for low back right sided pain worsening but been present for years. She denies any known injury. No radiation of pain down legs. The low back pain comes a goes but most recently paired with dysuria and increase in urinary frequency. She is concerned about kidney stone or infection. She denies any fever, chills, body aches. She has not tried anything to make better.   She was told about some coronary calcification and would like to consider statin therapy.   ROS See HPI.      Objective:    BP 130/77   Pulse 91   Temp 98.2 F (36.8 C) (Oral)   Ht 5' 6 (1.676 m)   Wt 177 lb (80.3 kg)   SpO2 99%   BMI 28.57 kg/m  BP Readings from Last 3 Encounters:  01/30/24 130/77  01/21/24 110/66  12/02/23 131/75   Wt Readings from Last 3 Encounters:  01/30/24 177 lb (80.3 kg)  01/21/24 180 lb 1.9 oz (81.7 kg)  12/02/23 181 lb 9.6 oz (82.4 kg)      Physical Exam Constitutional:      Appearance: Normal appearance.  HENT:     Head: Normocephalic.   Cardiovascular:     Rate and Rhythm: Normal rate and regular rhythm.  Pulmonary:     Effort: Pulmonary effort is normal.     Breath sounds: Normal breath sounds.  Abdominal:     General: There is no distension.     Palpations: Abdomen is soft. There is no mass.     Tenderness: There is no abdominal tenderness. There is no right CVA tenderness, left CVA tenderness, guarding or rebound.   Musculoskeletal:     Right lower leg: No edema.     Left lower leg: No edema.     Comments: NROM at waist Pain to palpation low right sided back with tight muscles surrounding   Neurological:     General: No focal deficit present.     Mental Status: She is alert and oriented to person,  place, and time.   Psychiatric:        Mood and Affect: Mood normal.     Results for orders placed or performed in visit on 01/30/24  Urine Culture   Specimen: Urine   Urine  Result Value Ref Range   Urine Culture, Routine Final report    Organism ID, Bacteria Comment   POCT URINALYSIS DIP (CLINITEK)  Result Value Ref Range   Color, UA yellow yellow   Clarity, UA clear clear   Glucose, UA negative negative mg/dL   Bilirubin, UA negative negative   Ketones, POC UA trace (5) (A) negative mg/dL   Spec Grav, UA >=5.284 (A) 1.010 - 1.025   Blood, UA small (A) negative   pH, UA 5.5 5.0 - 8.0   POC PROTEIN,UA negative negative, trace   Urobilinogen, UA 0.2 0.2 or 1.0 E.U./dL   Nitrite, UA Negative Negative   Leukocytes, UA Small (1+) (A) Negative        Assessment & Plan:  Debbie [name] was seen today for medical management of chronic issues.  Diagnoses and all orders for this visit:  Acute cystitis with hematuria -  POCT URINALYSIS DIP (CLINITEK) -     Urine Culture -     nitrofurantoin, macrocrystal-monohydrate, (MACROBID) 100 MG capsule; Take 1 capsule (100 mg total) by mouth 2 (two) times daily.  Chronic right-sided low back pain without sciatica  Coronary artery calcification -     rosuvastatin (CRESTOR) 10 MG tablet; Take 1 tablet (10 mg total) by mouth daily.   UA in office positive for blood, ketones, leukocytes Started empirical therapy with macrobid Will send off for urine culture  Suspect right sided low back pain is more musculoskeletal Consider massage therapy, icy hot patches, tens unit, muscle relaxers, stretches for low back Follow up as needed Consider xrays of lumbar spine and pelvis  Discussed coronary calcium/plaque Ok with trial of crestor 10mg  daily LDL looks great on last check.    Debbie Berdan, PA-C

## 2024-01-30 NOTE — Patient Instructions (Addendum)
 Start macrobid for 5 days.  Start crestor for cholesterol and stroke risk reduction.   Urinary Tract Infection, Female A urinary tract infection (UTI) is an infection in your urinary tract. The urinary tract is made up of organs that make, store, and get rid of pee (urine) in your body. These organs include: The kidneys. The ureters. The bladder. The urethra. What are the causes? Most UTIs are caused by germs called bacteria. They may be in or near your genitals. These germs grow and cause swelling in your urinary tract. What increases the risk? You're more likely to get a UTI if: You're a female. The urethra is shorter in females than in males. You have a soft tube called a catheter that drains your pee. You can't control when you pee or poop. You have trouble peeing because of: A kidney stone. A urinary blockage. A nerve condition that affects your bladder. Not getting enough to drink. You're sexually active. You use a birth control inside your vagina, like spermicide. You're pregnant. You have low levels of the hormone estrogen in your body. You're an older adult. You're also more likely to get a UTI if you have other health problems. These may include: Diabetes. A weak immune system. Your immune system is your body's defense system. Sickle cell disease. Injury of the spine. What are the signs or symptoms? Symptoms may include: Needing to pee right away. Peeing small amounts often. Pain or burning when you pee. Blood in your pee. Pee that smells bad or odd. Pain in your belly or lower back. You may also: Feel confused. This may be the first symptom in older adults. Vomit. Not feel hungry. Feel tired or easily annoyed. Have a fever or chills. How is this diagnosed? A UTI is diagnosed based on your medical history and an exam. You may also have other tests. These may include: Pee tests. Blood tests. Tests for sexually transmitted infections (STIs). If you've had  more than one UTI, you may need to have imaging studies done to find out why you keep getting them. How is this treated? A UTI can be treated by: Taking antibiotics or other medicines. Drinking enough fluid to keep your pee pale yellow. In rare cases, a UTI can cause a very bad condition called sepsis. Sepsis may be treated in the hospital. Follow these instructions at home: Medicines Take your medicines only as told by your health care provider. If you were given antibiotics, take them as told by your provider. Do not stop taking them even if you start to feel better. General instructions Make sure you: Pee often and fully. Do not hold your pee for a long time. Wipe from front to back after you pee or poop. Use each tissue only once when you wipe. Pee after you have sex. Do not douche or use sprays or powders in your genital area. Contact a health care provider if: Your symptoms don't get better after 1-2 days of taking antibiotics. Your symptoms go away and then come back. You have a fever or chills. You vomit or feel like you may vomit. Get help right away if: You have very bad pain in your back or lower belly. You faint. This information is not intended to replace advice given to you by your health care provider. Make sure you discuss any questions you have with your health care provider. Document Revised: 07/16/2023 Document Reviewed: 11/08/2022 Elsevier Patient Education  2025 [employer].

## 2024-01-31 LAB — CBC
Hematocrit: 40.4 % (ref 34.0–46.6)
Hemoglobin: 13.4 g/dL (ref 11.1–15.9)
MCH: 29.8 pg (ref 26.6–33.0)
MCHC: 33.2 g/dL (ref 31.5–35.7)
MCV: 90 fL (ref 79–97)
Platelets: 237 10*3/uL (ref 150–450)
RBC: 4.49 x10E6/uL (ref 3.77–5.28)
RDW: 11.8 % (ref 11.7–15.4)
WBC: 5.8 10*3/uL (ref 3.4–10.8)

## 2024-01-31 LAB — BASIC METABOLIC PANEL WITH GFR
BUN/Creatinine Ratio: 19 (ref 12–28)
BUN: 14 mg/dL (ref 8–27)
CO2: 22 mmol/L (ref 20–29)
Calcium: 9.3 mg/dL (ref 8.7–10.3)
Chloride: 106 mmol/L (ref 96–106)
Creatinine, Ser: 0.75 mg/dL (ref 0.57–1.00)
Glucose: 86 mg/dL (ref 70–99)
Potassium: 3.9 mmol/L (ref 3.5–5.2)
Sodium: 145 mmol/L — ABNORMAL HIGH (ref 134–144)
eGFR: 89 mL/min/{1.73_m2} (ref >59)

## 2024-01-31 LAB — LIPID PANEL
Chol/HDL Ratio: 1.9 ratio (ref 0.0–4.4)
Cholesterol, Total: 168 mg/dL (ref 100–199)
HDL: 88 mg/dL (ref >39)
LDL Chol Calc (NIH): 67 mg/dL (ref 0–99)
Triglycerides: 70 mg/dL (ref 0–149)
VLDL Cholesterol Cal: 13 mg/dL (ref 5–40)

## 2024-01-31 LAB — HEPATIC FUNCTION PANEL
ALT: 18 IU/L (ref 0–32)
AST: 23 IU/L (ref 0–40)
Albumin: 4.1 g/dL (ref 3.9–4.9)
Alkaline Phosphatase: 89 IU/L (ref 44–121)
Bilirubin Total: 0.4 mg/dL (ref 0.0–1.2)
Bilirubin, Direct: 0.14 mg/dL (ref 0.00–0.40)
Total Protein: 6 g/dL (ref 6.0–8.5)

## 2024-01-31 LAB — VITAMIN D 1,25 DIHYDROXY
Vitamin D 1, 25 (OH)2 Total: 78 pg/mL — ABNORMAL HIGH
Vitamin D2 1, 25 (OH)2: <10 pg/mL
Vitamin D3 1, 25 (OH)2: 73 pg/mL

## 2024-01-31 LAB — HEMOGLOBIN A1C
Est. average glucose Bld gHb Est-mCnc: 103 mg/dL
Hgb A1c MFr Bld: 5.2 % (ref 4.8–5.6)

## 2024-01-31 LAB — TSH: TSH: 1.27 u[IU]/mL (ref 0.450–4.500)

## 2024-02-01 LAB — URINE CULTURE

## 2024-02-02 ENCOUNTER — Ambulatory Visit: Payer: Self-pay | Admitting: Physician Assistant

## 2024-02-02 ENCOUNTER — Encounter: Payer: Self-pay | Admitting: Physician Assistant

## 2024-02-02 DIAGNOSIS — G8929 Other chronic pain: Secondary | ICD-10-CM | POA: Insufficient documentation

## 2024-02-02 DIAGNOSIS — R300 Dysuria: Secondary | ICD-10-CM | POA: Insufficient documentation

## 2024-02-02 DIAGNOSIS — I2510 Atherosclerotic heart disease of native coronary artery without angina pectoris: Secondary | ICD-10-CM | POA: Insufficient documentation

## 2024-02-02 NOTE — Progress Notes (Signed)
 No significant bacteria found on urine culture.

## 2024-02-05 DIAGNOSIS — Z713 Dietary counseling and surveillance: Secondary | ICD-10-CM | POA: Diagnosis not present

## 2024-02-06 DIAGNOSIS — M79671 Pain in right foot: Secondary | ICD-10-CM | POA: Diagnosis not present

## 2024-02-06 DIAGNOSIS — M79672 Pain in left foot: Secondary | ICD-10-CM | POA: Diagnosis not present

## 2024-02-10 ENCOUNTER — Other Ambulatory Visit: Payer: Self-pay | Admitting: Physician Assistant

## 2024-02-10 DIAGNOSIS — U071 COVID-19: Secondary | ICD-10-CM

## 2024-02-10 DIAGNOSIS — R051 Acute cough: Secondary | ICD-10-CM

## 2024-02-23 ENCOUNTER — Encounter: Payer: Self-pay | Admitting: Physician Assistant

## 2024-02-23 ENCOUNTER — Other Ambulatory Visit: Payer: Self-pay | Admitting: Physician Assistant

## 2024-02-23 DIAGNOSIS — Z8639 Personal history of other endocrine, nutritional and metabolic disease: Secondary | ICD-10-CM

## 2024-02-23 DIAGNOSIS — E663 Overweight: Secondary | ICD-10-CM

## 2024-02-24 ENCOUNTER — Other Ambulatory Visit: Payer: Self-pay

## 2024-02-24 DIAGNOSIS — B001 Herpesviral vesicular dermatitis: Secondary | ICD-10-CM

## 2024-02-24 MED ORDER — PHENTERMINE HCL 37.5 MG PO TABS: ORAL_TABLET | ORAL | 0 refills | Status: DC

## 2024-02-24 NOTE — Telephone Encounter (Signed)
 Requesting rx rf of valcyclovir 1,000mg  Last written 08/08/2021 Last ov 01/30/2024 Upcoming appt 03/05/2024

## 2024-02-25 ENCOUNTER — Telehealth: Payer: Self-pay

## 2024-02-25 ENCOUNTER — Other Ambulatory Visit (HOSPITAL_COMMUNITY): Payer: Self-pay

## 2024-02-25 MED ORDER — VALACYCLOVIR HCL 1 G PO TABS: ORAL_TABLET | ORAL | 11 refills | Status: DC

## 2024-02-25 NOTE — Telephone Encounter (Signed)
 Pharmacy Patient Advocate Encounter   Received notification from Patient Pharmacy that prior authorization for Phentermine  37.5mg  tabs is required/requested.   Insurance verification completed.   The patient is insured through [employer] .   Per test claim: PA required; PA started via CoverMyMeds. KEY [id number] . Waiting for clinical questions to populate.

## 2024-02-26 NOTE — Telephone Encounter (Signed)
 Clinical questions answered and PA submitted.

## 2024-02-26 NOTE — Telephone Encounter (Signed)
 Pharmacy Patient Advocate Encounter  Received notification from [facility] that Prior Authorization for Phentermine  37.5mg  tabs has been DENIED.  See denial reason below. No denial letter attached in CMM. Will attach denial letter to Media tab once received.   PA #/Case ID/Reference #: [id number]

## 2024-02-27 MED ORDER — PHENTERMINE HCL 37.5 MG PO TABS: ORAL_TABLET | ORAL | 0 refills | Status: DC

## 2024-02-27 NOTE — Addendum Note (Signed)
 Addended by: BONNY JON DEL on: 02/27/2024 02:33 PM   Modules accepted: Orders

## 2024-02-27 NOTE — Addendum Note (Signed)
 Addended byBETHA ANTONIETTE VERMELL LITTIE on: 02/27/2024 02:50 PM   Modules accepted: Orders

## 2024-02-27 NOTE — Telephone Encounter (Signed)
 Pt will pay out of pocket

## 2024-03-05 ENCOUNTER — Ambulatory Visit: Admitting: Physician Assistant

## 2024-03-05 ENCOUNTER — Encounter: Payer: Self-pay | Admitting: Physician Assistant

## 2024-03-05 VITALS — BP 122/74 | HR 90 | Ht 66.0 in | Wt 178.0 lb

## 2024-03-05 DIAGNOSIS — Z8639 Personal history of other endocrine, nutritional and metabolic disease: Secondary | ICD-10-CM | POA: Diagnosis not present

## 2024-03-05 DIAGNOSIS — B001 Herpesviral vesicular dermatitis: Secondary | ICD-10-CM

## 2024-03-05 DIAGNOSIS — E663 Overweight: Secondary | ICD-10-CM

## 2024-03-05 DIAGNOSIS — I10 Essential (primary) hypertension: Secondary | ICD-10-CM

## 2024-03-05 DIAGNOSIS — Z713 Dietary counseling and surveillance: Secondary | ICD-10-CM | POA: Diagnosis not present

## 2024-03-05 MED ORDER — VALACYCLOVIR HCL 1 G PO TABS: ORAL_TABLET | ORAL | 4 refills | Status: AC

## 2024-03-05 NOTE — Progress Notes (Signed)
 Established Patient Office Visit  Subjective   Patient ID: Debbie [name], female    DOB: 1961/05/18  Age: 63 y.o. MRN: [id number]  Chief Complaint  Patient presents with   Medical Management of Chronic Issues    Wgt managment    Patient is here for a weight-loss follow up. She has been taking phentermine  and using the SlenderX drops since her last visit. Patient has felt like the 'food noise' has been reduced by the SlenderX drops by 20-30%. She states that stress will often trigger her emotional eating, and for about 8 weeks after her visit, her kitchen was being redone, causing an increased stress level and she admits to eating out more during this time. Patient states she has now been trying to increase water intake, count calories, make better snack decisions. Patient is working out 4 days a week. She asks about her Vitamin D  and Magnesium levels because she heard at a weight loss webinar, that deficiency in these levels may have an influence on weight.   Her anxiety has been well controlled on Effexor . Patient is asking about her kidney function and recent lab results from her prior visit for further clarification.   Needs refills on valtrex  due to recurrent cold sores.   Review of Systems  All other systems reviewed and are negative.     Objective:     BP 122/74   Pulse 90   Ht 5' 6 (1.676 m)   Wt 178 lb (80.7 kg)   SpO2 98%   BMI 28.73 kg/m  Wt Readings from Last 3 Encounters:  03/05/24 178 lb (80.7 kg)  01/30/24 177 lb (80.3 kg)  01/21/24 180 lb 1.9 oz (81.7 kg)      Physical Exam Vitals reviewed.  Constitutional:      Appearance: Normal appearance.  HENT:     Head: Normocephalic.  Cardiovascular:     Rate and Rhythm: Normal rate.     Pulses: Normal pulses.     Heart sounds: Normal heart sounds.  Pulmonary:     Effort: Pulmonary effort is normal.     Breath sounds: Normal breath sounds.  Abdominal:     General: Abdomen is flat.  Musculoskeletal:         General: Normal range of motion.     Cervical back: Normal range of motion.  Skin:    General: Skin is warm and dry.  Neurological:     Mental Status: She is alert.       Last metabolic panel Lab Results  Component Value Date   GLUCOSE 86 01/22/2024   NA 145 (H) 01/22/2024   K 3.9 01/22/2024   CL 106 01/22/2024   CO2 22 01/22/2024   BUN 14 01/22/2024   CREATININE 0.75 01/22/2024   EGFR 89 01/22/2024   CALCIUM  9.3 01/22/2024   PROT 6.0 01/22/2024   ALBUMIN 4.1 01/22/2024   LABGLOB 1.7 05/15/2023   AGRATIO 2.3 (H) 07/10/2022   BILITOT 0.4 01/22/2024   ALKPHOS 89 01/22/2024   AST 23 01/22/2024   ALT 18 01/22/2024   ANIONGAP 7 05/30/2022    The 10-year ASCVD risk score (Arnett DK, et al., 2019) is: 3.9%    Assessment & Plan:    Overweight/ hx of obesity Weight loss medication monitoring - Continue phentermine ; discussed tapering down on dose/ cutting tablet in half at next visit  - Continue SlenderX drops  -pt could not tolerate GLP-1s due to nausea/vomiting/GI symptoms -consider referral to healthy weight and  wellness - Educated patient on taking magnesium glycinate at night; oral or spray formulation - Educated patient on Vit D levels from previous visit, which were WNL - Encouraged patient to continue to make healthy food choices, stay active and continue to drink water  Essential Hypertension  - BP 122/74 - Continue losartan  and metoprolol  - Educated patient on kidney function from prior visit, what GFR and serum creatinine was   Recurrent cold sores - Valtrex  prescription sent into [facility]   Anxiety - Continue effexor  - controlled symptoms today  Return in about 3 months (around 06/05/2024).    Yoali Conry, PA-C

## 2024-03-05 NOTE — Patient Instructions (Signed)
 Magnesium glycinate up to 400mg  at bedtime Consider referral to weight loss clinic Continue phentermine  for now until you run out then we need to go down to at least 15mg 

## 2024-03-08 ENCOUNTER — Encounter: Payer: Self-pay | Admitting: Physician Assistant

## 2024-04-15 DIAGNOSIS — Z713 Dietary counseling and surveillance: Secondary | ICD-10-CM | POA: Diagnosis not present

## 2024-05-07 DIAGNOSIS — Z713 Dietary counseling and surveillance: Secondary | ICD-10-CM | POA: Diagnosis not present

## 2024-06-04 ENCOUNTER — Ambulatory Visit: Admitting: Physician Assistant

## 2024-06-07 ENCOUNTER — Other Ambulatory Visit: Payer: Self-pay | Admitting: Physician Assistant

## 2024-06-07 DIAGNOSIS — I10 Essential (primary) hypertension: Secondary | ICD-10-CM

## 2024-06-07 DIAGNOSIS — F439 Reaction to severe stress, unspecified: Secondary | ICD-10-CM

## 2024-06-22 ENCOUNTER — Encounter: Payer: Self-pay | Admitting: Physician Assistant

## 2024-06-23 ENCOUNTER — Encounter: Payer: Self-pay | Admitting: Physician Assistant

## 2024-06-23 ENCOUNTER — Ambulatory Visit: Admitting: Physician Assistant

## 2024-06-23 VITALS — BP 116/74 | HR 71 | Ht 66.0 in | Wt 176.0 lb

## 2024-06-23 DIAGNOSIS — Z23 Encounter for immunization: Secondary | ICD-10-CM | POA: Diagnosis not present

## 2024-06-23 DIAGNOSIS — G4733 Obstructive sleep apnea (adult) (pediatric): Secondary | ICD-10-CM

## 2024-06-23 DIAGNOSIS — R5383 Other fatigue: Secondary | ICD-10-CM | POA: Diagnosis not present

## 2024-06-23 DIAGNOSIS — E663 Overweight: Secondary | ICD-10-CM

## 2024-06-23 DIAGNOSIS — F439 Reaction to severe stress, unspecified: Secondary | ICD-10-CM

## 2024-06-23 DIAGNOSIS — I10 Essential (primary) hypertension: Secondary | ICD-10-CM

## 2024-06-23 MED ORDER — PHENTERMINE HCL 15 MG PO CAPS: 15.0000 mg | ORAL_CAPSULE | ORAL | 0 refills | Status: DC

## 2024-06-23 NOTE — Patient Instructions (Signed)
 Referral to weight and wellness Labs ordered today.

## 2024-06-23 NOTE — Progress Notes (Signed)
 "  Established Patient Office Visit  Subjective   Patient ID: Debbie [name], female    DOB: 11-27-1960  Age: 63 y.o. MRN: [id number]  Chief Complaint  Patient presents with   Medical Management of Chronic Issues    HPI Discussed the use of AI scribe software for clinical note transcription with the patient, who gave verbal consent to proceed.  History of Present Illness Debbie [name] is a 63 year old female with hypertension who presents for follow-up on weight management and blood pressure control.  Hypertension - Hypertension managed with losartan  (Cozaar ). - No consistent home blood pressure monitoring. - Focus of visit includes blood pressure control.  Weight management - Currently taking phentermine  for weight loss; only 2 pounds lost since July. - Intolerant to higher doses of Zepbound  due to adverse effects. - Unable to tolerate Topamax due to dysgeusia. - Goal to lose approximately 30 pounds. - Exploring alternative weight loss options.  Exercise and physical activity - Kickboxing on Mondays and Wednesdays. - Strength training on Tuesdays. - High-intensity interval training (HIIT) on Thursdays. - Engages in shopping or yard work on weekends.  Dietary considerations - Attempting to increase protein intake. - Difficulty chewing large amounts of bubble gum. - Lactose intolerance limits dietary options.  Immunization status - Received influenza vaccine at work. - Up to date with pneumonia booster. - Declined COVID-19 and shingles vaccines at this time.  Fatigue and palpitations - Intermittent fatigue, especially with caffeine intake. - Palpitations present.  Soft drink consumption - Does not drink soft drinks regularly. - Occasionally consumes one Cheerwine per week.    ROS See HPI.    Objective:     BP 116/74   Pulse 71   Ht 5' 6 (1.676 m)   Wt 176 lb (79.8 kg)   SpO2 99%   BMI 28.41 kg/m  BP Readings from Last 3 Encounters:   06/23/24 116/74  03/05/24 122/74  01/30/24 130/77   Wt Readings from Last 3 Encounters:  06/23/24 176 lb (79.8 kg)  03/05/24 178 lb (80.7 kg)  01/30/24 177 lb (80.3 kg)      Physical Exam Constitutional:      Appearance: Normal appearance.  HENT:     Head: Normocephalic.  Cardiovascular:     Rate and Rhythm: Normal rate and regular rhythm.  Pulmonary:     Effort: Pulmonary effort is normal.  Neurological:     General: No focal deficit present.     Mental Status: She is alert and oriented to person, place, and time.  Psychiatric:        Mood and Affect: Mood normal.        Assessment & Plan:  SABRASABRAPeggie was seen today for medical management of chronic issues.  Diagnoses and all orders for this visit:  Essential hypertension, benign  Stress  Overweight (BMI 25.0-29.9) -     CBC w/Diff/Platelet -     B12 and Folate Panel -     TSH + free T4 -     Fe+TIBC+Fer -     CMP14+EGFR -     Amb Ref to Medical Weight Management -     phentermine  15 MG capsule; Take 1 capsule (15 mg total) by mouth every morning.  Immunization due -     Pneumococcal conjugate vaccine 20-valent (Prevnar 20)  No energy -     CBC w/Diff/Platelet -     B12 and Folate Panel -     TSH + free T4 -  Fe+TIBC+Fer -     CMP14+EGFR   Assessment & Plan Essential hypertension Blood pressure slightly elevated at 141/74 mmHg. Current medications include Losartan  and Metoprolol . - Rechecked blood pressure and decreased to 116/74. Looks [employer].  - Continue Losartan  and Metoprolol .  Overweight with history of obesity Weight loss minimal with 2 pounds lost since July. Phentermine  not indicated at higher doses. Discussed alternative weight management strategies, including compounded medications and lifestyle modifications. Discussed potential side effects of phentermine , including heart strain and fatigue, especially with caffeine intake. - Referred to [facility] for dietary guidance  and support. - Continue phentermine  for another three months. - Keep a food diary for a few days before seeing [facility]. - Encouraged exercise routine including kickboxing, strength training, and HIIT. - Advised on dietary modifications and healthy snack options.  OSA - contact DME for ordering of mask and supplies - zepbound  2.5mg  for weight support patient does not tolerate above 2.5mg  due to GI side effects  General Health Maintenance Flu shot received at work. Administered pneumonia booster. Declined COVID and shingles vaccines. Discussed importance of maintaining up-to-date vaccinations. - Administered pneumonia booster. - Continue routine health maintenance and vaccinations as needed.    Return in 3 months (on 09/23/2024), or if symptoms worsen or fail to improve.    [name], PA-C  "

## 2024-06-24 LAB — CMP14+EGFR
ALT: 21 IU/L (ref 0–32)
AST: 25 IU/L (ref 0–40)
Albumin: 4.1 g/dL (ref 3.9–4.9)
Alkaline Phosphatase: 89 IU/L (ref 49–135)
BUN/Creatinine Ratio: 14 (ref 12–28)
BUN: 18 mg/dL (ref 8–27)
Bilirubin Total: 0.3 mg/dL (ref 0.0–1.2)
CO2: 27 mmol/L (ref 20–29)
Calcium: 9.2 mg/dL (ref 8.7–10.3)
Chloride: 101 mmol/L (ref 96–106)
Creatinine, Ser: 1.25 mg/dL — ABNORMAL HIGH (ref 0.57–1.00)
Globulin, Total: 2 g/dL (ref 1.5–4.5)
Glucose: 96 mg/dL (ref 70–99)
Potassium: 3.1 mmol/L — ABNORMAL LOW (ref 3.5–5.2)
Sodium: 143 mmol/L (ref 134–144)
Total Protein: 6.1 g/dL (ref 6.0–8.5)
eGFR: 48 mL/min/1.73 — ABNORMAL LOW (ref >59)

## 2024-06-24 LAB — CBC WITH DIFFERENTIAL/PLATELET
Basophils Absolute: 0 x10E3/uL (ref 0.0–0.2)
Basos: 1 %
EOS (ABSOLUTE): 0.3 x10E3/uL (ref 0.0–0.4)
Eos: 4 %
Hematocrit: 40.3 % (ref 34.0–46.6)
Hemoglobin: 13.6 g/dL (ref 11.1–15.9)
Immature Grans (Abs): 0 x10E3/uL (ref 0.0–0.1)
Immature Granulocytes: 0 %
Lymphocytes Absolute: 1.5 x10E3/uL (ref 0.7–3.1)
Lymphs: 23 %
MCH: 30.3 pg (ref 26.6–33.0)
MCHC: 33.7 g/dL (ref 31.5–35.7)
MCV: 90 fL (ref 79–97)
Monocytes Absolute: 0.7 x10E3/uL (ref 0.1–0.9)
Monocytes: 10 %
Neutrophils Absolute: 4.1 x10E3/uL (ref 1.4–7.0)
Neutrophils: 62 %
Platelets: 275 x10E3/uL (ref 150–450)
RBC: 4.49 x10E6/uL (ref 3.77–5.28)
RDW: 11.7 % (ref 11.7–15.4)
WBC: 6.6 x10E3/uL (ref 3.4–10.8)

## 2024-06-24 LAB — B12 AND FOLATE PANEL
Folate: 5.1 ng/mL (ref >3.0)
Vitamin B-12: 363 pg/mL (ref 232–1245)

## 2024-06-24 LAB — IRON,TIBC AND FERRITIN PANEL
Ferritin: 78 ng/mL (ref 15–150)
Iron Saturation: 29 % (ref 15–55)
Iron: 73 ug/dL (ref 27–139)
Total Iron Binding Capacity: 255 ug/dL (ref 250–450)
UIBC: 182 ug/dL (ref 118–369)

## 2024-06-24 LAB — TSH+FREE T4
Free T4: 0.94 ng/dL (ref 0.82–1.77)
TSH: 0.963 u[IU]/mL (ref 0.450–4.500)

## 2024-06-25 ENCOUNTER — Encounter: Payer: Self-pay | Admitting: Physician Assistant

## 2024-06-25 ENCOUNTER — Ambulatory Visit: Payer: Self-pay | Admitting: Physician Assistant

## 2024-06-25 DIAGNOSIS — R899 Unspecified abnormal finding in specimens from other organs, systems and tissues: Secondary | ICD-10-CM

## 2024-06-25 DIAGNOSIS — E876 Hypokalemia: Secondary | ICD-10-CM

## 2024-06-25 MED ORDER — ZEPBOUND 2.5 MG/0.5ML ~~LOC~~ SOAJ: 2.5000 mg | SUBCUTANEOUS | 1 refills | Status: DC

## 2024-06-25 NOTE — Progress Notes (Signed)
 Debbie [name],   Stop phentermine . Your kidney function decreased quite a bit over 5 months. Increase hydration and recheck in 1 week. Your potassium is low as well. Increase potassium rich foods and recheck in one week.

## 2024-06-29 NOTE — Telephone Encounter (Signed)
 Orders placed in patient chart for BMP for repeat lab testing in one week.

## 2024-06-30 ENCOUNTER — Telehealth: Payer: Self-pay | Admitting: Pharmacy Technician

## 2024-06-30 NOTE — Telephone Encounter (Signed)
 Pharmacy Patient Advocate Encounter   Received notification from Onbase that prior authorization for Phentermine  HCl 15MG  capsules is required/requested.   Insurance verification completed.   The patient is insured through [employer].  Medication was discontinued on 06/25/2024. Achieved Key # [id number].

## 2024-07-01 ENCOUNTER — Other Ambulatory Visit (HOSPITAL_COMMUNITY): Payer: Self-pay

## 2024-07-01 ENCOUNTER — Encounter: Payer: Self-pay | Admitting: Physician Assistant

## 2024-07-02 ENCOUNTER — Other Ambulatory Visit (HOSPITAL_COMMUNITY): Payer: Self-pay

## 2024-07-02 ENCOUNTER — Telehealth: Payer: Self-pay

## 2024-07-02 DIAGNOSIS — R899 Unspecified abnormal finding in specimens from other organs, systems and tissues: Secondary | ICD-10-CM | POA: Diagnosis not present

## 2024-07-02 DIAGNOSIS — E876 Hypokalemia: Secondary | ICD-10-CM | POA: Diagnosis not present

## 2024-07-02 NOTE — Telephone Encounter (Signed)
 Pharmacy Patient Advocate Encounter   Received notification from Patient Advice Request messages that prior authorization for Zepbound  2.5mg /0.58ml is required/requested.   Insurance verification completed.   The patient is insured through [employer].   Per test claim: The current 28 day co-pay is, $30.  No PA needed at this time. This test claim was processed through [facility]- copay amounts may vary at other pharmacies due to pharmacy/plan contracts, or as the patient moves through the different stages of their insurance plan.     Placed a call to [facility] and per the representative, the prior authorization for the Zepbound  was extended until 08/18/2098.

## 2024-07-03 LAB — BMP8+EGFR
BUN/Creatinine Ratio: 22 (ref 12–28)
BUN: 14 mg/dL (ref 8–27)
CO2: 25 mmol/L (ref 20–29)
Calcium: 8.7 mg/dL (ref 8.7–10.3)
Chloride: 108 mmol/L — ABNORMAL HIGH (ref 96–106)
Creatinine, Ser: 0.64 mg/dL (ref 0.57–1.00)
Glucose: 81 mg/dL (ref 70–99)
Potassium: 3.9 mmol/L (ref 3.5–5.2)
Sodium: 144 mmol/L (ref 134–144)
eGFR: 99 mL/min/1.73 (ref >59)

## 2024-07-05 NOTE — Progress Notes (Signed)
 Debbie [name],   Kidney function MUCH better!

## 2024-07-13 ENCOUNTER — Ambulatory Visit: Admitting: Family Medicine

## 2024-07-13 ENCOUNTER — Encounter: Payer: Self-pay | Admitting: Family Medicine

## 2024-07-13 VITALS — BP 131/78 | HR 77 | Ht 64.5 in | Wt 174.0 lb

## 2024-07-13 DIAGNOSIS — I10 Essential (primary) hypertension: Secondary | ICD-10-CM | POA: Diagnosis not present

## 2024-07-13 DIAGNOSIS — Z853 Personal history of malignant neoplasm of breast: Secondary | ICD-10-CM | POA: Diagnosis not present

## 2024-07-13 DIAGNOSIS — E663 Overweight: Secondary | ICD-10-CM | POA: Diagnosis not present

## 2024-07-13 DIAGNOSIS — Z6829 Body mass index (BMI) 29.0-29.9, adult: Secondary | ICD-10-CM

## 2024-07-13 NOTE — Telephone Encounter (Signed)
 She needs to stop the phentermine  if she is starting Zepbound .

## 2024-07-13 NOTE — Progress Notes (Signed)
 Office: [phone]  /  Fax: [phone]   Initial Visit  Debbie [name] was seen in clinic today to evaluate for obesity. She is interested in losing weight to improve overall health and reduce the risk of weight related complications. She presents today to review program treatment options, initial physical assessment, and evaluation.     She was referred by: PCP  When asked what else they would like to accomplish? She states: Adopt a healthier eating pattern and lifestyle, Improve energy levels and physical activity, Improve existing medical conditions, Reduce number of medications, and Improve quality of life  Weight history:  max weight 273 lb in 2015 and lost a good amount of weight using meal replacement (Robard).  She has wanted to be ~140 lb.  She had skin removal at 153 lb.  She tried Zepbound  at low doses but had V/D above the lowest dose.  She also tried Phentermine  in the past.  She lives w/ her husband.  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 She states: Contributed to medical problems and Having fatigue  Some associated conditions: Hypertension, GERD, and Other: R breast cancer  Contributing factors: moderate to high levels of stress, menopause, and sedentary job Had R breast cancer in 2020, had chemo, radiation and lumpectomy  Weight promoting medications identified: Other: maybe tamoxifen   Current nutrition plan: None  Current level of physical activity: Other: cardio and weights 4 x a week  Current or previous pharmacotherapy: GLP-1 and Phentermine   Response to medication: had GI SE on Zepbound  5+ mg and with Wegovy    Past medical history includes:   Past Medical History:  Diagnosis Date   Abnormal CT of the abdomen 11/11/2018   Allergy  12/1995   spring seasonal   Anxiety 12/01/2012   Asthma    with severe URIs   Bilateral hand pain 01/23/2018    Bladder mass 11/13/2018   Breast cancer (HCC) 10/30/2018   right breast and lymph   Bronchitis    Change in bowel habits 10/08/2023   Chest tightness 07/09/2017   Chronic shoulder pain 06/26/2016   Class 1 obesity due to excess calories with serious comorbidity and body mass index (BMI) of 30.0 to 30.9 in adult 03/05/2013   Dr. Waylan: Qsymia started July 2014, sleep study ordered.   Class 1 obesity due to excess calories without serious comorbidity with body mass index (BMI) of 30.0 to 30.9 in adult 02/27/2023   Colitis presumed infectious 04/29/2023   Coronary artery calcification 07/10/2022   Cough 07/09/2017   Diarrhea 04/29/2023   Elevated LFTs 05/11/2013   Liver biopsy normal spring 2015   Enlarged lymph nodes 11/13/2018   Essential hypertension, benign 12/01/2012   Family history of colon cancer    Family history of stroke 04/29/2018   Fungal infection of toenail 01/17/2017   Gastritis 02/17/2013   GERD (gastroesophageal reflux disease)    GIB (gastrointestinal bleeding) 04/29/2023   Hammertoe of right foot 03/18/2023   Hand joint stiff, unspecified laterality 01/23/2018   History of breast cancer in adulthood 04/29/2023   History of hysterectomy 01/04/2013   History of obesity 12/02/2023   History of shingles 01/04/2013   Age 64   Hypokalemia 04/29/2023   Hypomagnesemia 05/14/2023   Hypotension 04/29/2018   Irritable bowel syndrome (IBS)    had for years, per patient   Labral tear of shoulder 08/29/2014  Large breasts 06/26/2016   Liver lesion 11/13/2018   Liver nodule 11/11/2018   Malignant neoplasm of upper-inner quadrant of right breast in female, 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 IgG labs 2019.   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   No energy 10/20/2017   Nocturnal hypoxemia 11/23/2017   Non-restorative sleep 10/20/2017   OSA  (obstructive sleep apnea) 11/23/2017   Osteopenia 12/20/2022   Overweight (BMI 25.0-29.9) 12/01/2023   Palpitations 07/10/2022   Periodic limb movement 11/23/2017   Personal history of chemotherapy    Personal history of radiation therapy    PONV (postoperative nausea and vomiting)    Port-A-Cath in place 12/29/2018   Positive ANA (antinuclear antibody) 02/06/2018   Primary insomnia 06/26/2016   Primary osteoarthritis of both feet 04/29/2018   Primary osteoarthritis of both hands 04/29/2018   RBBB 05/13/2022   Reactive airway disease, mild intermittent, uncomplicated 10/21/2017   Rectocele 10/20/2017   Right foot pain 03/18/2023   Seasonal allergies    Sigmoid thickening 11/13/2018   Snoring 10/20/2017   SOB (shortness of breath) 07/09/2017   Stress 08/28/2020   Tachycardia 08/28/2020   Thyroid  nodule 11/13/2018     Objective:   BP 131/78   Pulse 77   Ht 5' 4.5 (1.638 m)   Wt 174 lb (78.9 kg)   SpO2 98%   BMI 29.41 kg/m  She was weighed on the bioimpedance scale: Body mass index is 29.41 kg/m.  Peak Weight:273 , Body Fat%:40.2, Visceral Fat Rating:10, Weight trend over the last 12 months: Increasing  General:  Alert, oriented and cooperative. Patient is in no acute distress.  Respiratory: Normal respiratory effort, no problems with respiration noted   Gait: able to ambulate independently  Mental Status: Normal mood and affect. Normal behavior. Normal judgment and thought content.   DIAGNOSTIC DATA REVIEWED:  BMET    Component Value Date/Time   NA 144 07/02/2024 0933   K 3.9 07/02/2024 0933   CL 108 (H) 07/02/2024 0933   CO2 25 07/02/2024 0933   GLUCOSE 81 07/02/2024 0933   GLUCOSE 93 05/30/2022 1235   BUN 14 07/02/2024 0933   CREATININE 0.64 07/02/2024 0933   CREATININE 0.67 05/30/2022 1235   CREATININE 0.76 05/13/2022 0000   CALCIUM  8.7 07/02/2024 0933   GFRNONAA >60 05/30/2022 1235   GFRNONAA 90 08/25/2020 0000   GFRAA 104 08/25/2020 0000   Lab  Results  Component Value Date   HGBA1C 5.2 01/22/2024   No results found for: INSULIN CBC    Component Value Date/Time   WBC 6.6 06/23/2024 1439   WBC 6.5 05/30/2022 1235   WBC 5.0 05/13/2022 0000   RBC 4.49 06/23/2024 1439   RBC 4.51 05/30/2022 1235   HGB 13.6 06/23/2024 1439   HCT 40.3 06/23/2024 1439   PLT 275 06/23/2024 1439   MCV 90 06/23/2024 1439   MCH 30.3 06/23/2024 1439   MCH 30.4 05/30/2022 1235   MCHC 33.7 06/23/2024 1439   MCHC 34.9 05/30/2022 1235   RDW 11.7 06/23/2024 1439   Iron/TIBC/Ferritin/ %Sat    Component Value Date/Time   IRON 73 06/23/2024 1439   TIBC 255 06/23/2024 1439   FERRITIN 78 06/23/2024 1439   IRONPCTSAT 29 06/23/2024 1439   IRONPCTSAT 18 (L) 05/11/2013 0001   Lipid Panel     Component Value Date/Time   CHOL 168 01/22/2024 0856   TRIG 70 01/22/2024 0856  HDL 88 01/22/2024 0856   CHOLHDL 1.9 01/22/2024 0856   CHOLHDL 2.1 05/13/2022 0000   VLDL 17 06/09/2019 1236   LDLCALC 67 01/22/2024 0856   LDLCALC 78 05/13/2022 0000   Hepatic Function Panel     Component Value Date/Time   PROT 6.1 06/23/2024 1439   ALBUMIN 4.1 06/23/2024 1439   AST 25 06/23/2024 1439   AST 17 05/30/2022 1235   ALT 21 06/23/2024 1439   ALT 12 05/30/2022 1235   ALKPHOS 89 06/23/2024 1439   BILITOT 0.3 06/23/2024 1439   BILITOT 0.7 05/30/2022 1235   BILIDIR 0.14 01/22/2024 0856      Component Value Date/Time   TSH 0.963 06/23/2024 1439     Assessment and Plan:   History of breast cancer Reviewed R breast cancer history Work on reducing body fat % toward 30 to reduce risk of future weight related cancers Continue Tamoxifen  per Dr Loretha  Overweight (BMI 25.0-29.9) Patient well known to me from obesity management in years past with a 100 lb weight loss using protein sparing LCD.  Reviewed her expectations and goals.  Primary hypertension BP well controlled on losartan  100 mg daily, metoprolol  25 mg as needed for tachycardia       Obesity  Treatment / Action Plan:  Patient will work on garnering support from family and friends to begin weight loss journey. Will work on eliminating or reducing the presence of highly palatable, calorie dense foods in the home. Will complete provided nutritional and psychosocial assessment questionnaire before the next appointment. Will be scheduled for indirect calorimetry to determine resting energy expenditure in a fasting state.  This will allow us  to create a reduced calorie, high-protein meal plan to promote loss of fat mass while preserving muscle mass. Will think about ideas on how to incorporate physical activity into their daily routine. Was counseled on nutritional approaches to weight loss and benefits of reducing processed foods and consuming plant-based foods and high quality protein as part of nutritional weight management. Was counseled on pharmacotherapy and role as an adjunct in weight management.   Obesity Education Performed Today:  She was weighed on the bioimpedance scale and results were discussed and documented in the synopsis.  We discussed obesity as a disease and the importance of a more detailed evaluation of all the factors contributing to the disease.  We discussed the importance of long term lifestyle changes which include nutrition, exercise and behavioral modifications as well as the importance of customizing this to her specific health and social needs.  We discussed the benefits of reaching a healthier weight to alleviate the symptoms of existing conditions and reduce the risks of the biomechanical, metabolic and psychological effects of obesity.  Debbie [name] appears to be in the action stage of change and states they are ready to start intensive lifestyle modifications and behavioral modifications.  22 minutes was spent today on this visit including the above counseling, pre-visit chart review, and post-visit documentation.  Reviewed by clinician on day  of visit: allergies, medications, problem list, medical history, surgical history, family history, social history, and previous encounter notes pertinent to obesity diagnosis.    Darice Haddock, D.O. DABFM, [facility] [address] [place], [place] [address] [phone]

## 2024-07-14 NOTE — Telephone Encounter (Signed)
 Personally I would not recommend this because I want a make sure that she is doing okay on the Zepbound  and if she is having any concerns or side effects I want to know if it is the medication or if it is a combination of the 2 then it is much more confusing.  So I recommend that she stop the phentermine  when she starts the Zepbound .

## 2024-07-14 NOTE — Telephone Encounter (Signed)
 Patient will not take phentermine  ( has not had this in last 5 days ) she will start Zepbound  on sataurday 07/16/2024

## 2024-08-03 DIAGNOSIS — H1045 Other chronic allergic conjunctivitis: Secondary | ICD-10-CM | POA: Diagnosis not present

## 2024-08-03 DIAGNOSIS — J3089 Other allergic rhinitis: Secondary | ICD-10-CM | POA: Diagnosis not present

## 2024-08-03 DIAGNOSIS — J4520 Mild intermittent asthma, uncomplicated: Secondary | ICD-10-CM | POA: Diagnosis not present

## 2024-08-03 DIAGNOSIS — J301 Allergic rhinitis due to pollen: Secondary | ICD-10-CM | POA: Diagnosis not present

## 2024-08-04 ENCOUNTER — Ambulatory Visit: Admitting: Family Medicine

## 2024-08-04 ENCOUNTER — Encounter: Payer: Self-pay | Admitting: Family Medicine

## 2024-08-04 VITALS — BP 130/79 | HR 70 | Temp 98.2°F | Ht 64.5 in | Wt 174.0 lb

## 2024-08-04 DIAGNOSIS — Z1331 Encounter for screening for depression: Secondary | ICD-10-CM | POA: Diagnosis not present

## 2024-08-04 DIAGNOSIS — E663 Overweight: Secondary | ICD-10-CM | POA: Diagnosis not present

## 2024-08-04 DIAGNOSIS — R252 Cramp and spasm: Secondary | ICD-10-CM | POA: Diagnosis not present

## 2024-08-04 DIAGNOSIS — Z853 Personal history of malignant neoplasm of breast: Secondary | ICD-10-CM | POA: Diagnosis not present

## 2024-08-04 DIAGNOSIS — R5383 Other fatigue: Secondary | ICD-10-CM

## 2024-08-04 DIAGNOSIS — M8580 Other specified disorders of bone density and structure, unspecified site: Secondary | ICD-10-CM

## 2024-08-04 DIAGNOSIS — R0602 Shortness of breath: Secondary | ICD-10-CM | POA: Diagnosis not present

## 2024-08-04 DIAGNOSIS — G4733 Obstructive sleep apnea (adult) (pediatric): Secondary | ICD-10-CM

## 2024-08-04 DIAGNOSIS — I10 Essential (primary) hypertension: Secondary | ICD-10-CM | POA: Diagnosis not present

## 2024-08-04 NOTE — Progress Notes (Signed)
 At a Glance:  Vitals Temp: 98.2 F (36.8 C) BP: 130/79 Pulse Rate: 70 SpO2: 97 %   Anthropometric Measurements Height: 5' 4.5 (1.638 m) Weight: 174 lb (78.9 kg) BMI (Calculated): 29.42 Starting Weight: 174lb Peak Weight: 273lb   Body Composition  Body Fat %: 40.6 % Fat Mass (lbs): 71 lbs Muscle Mass (lbs): 98.6 lbs Total Body Water (lbs): 69 lbs Visceral Fat Rating : 11   Other Clinical Data RMR: 1944 Fasting: yes Labs: yes Today's Visit #: 1 Starting Date: 08/04/24    EKG: NSR, 84 BPM with NSR,  reviewed from 01/21/24  Indirect Calorimeter completed today shows a VO2 of 281 and a REE of 1944.  Her calculated basal metabolic rate is 8571 thus her basal metabolic rate is better than expected.  Chief Complaint:  Obesity   Subjective:  Debbie [name] (MR# [id number]) is a 63 y.o. female who presents for evaluation and treatment of obesity and related comorbidities.   Debbie [name] is currently in the action stage of change and ready to dedicate time achieving and maintaining a healthier weight. Debbie [name] is interested in becoming our patient and working on intensive lifestyle modifications including (but not limited to) diet and exercise for weight loss.  Debbie [name] has been struggling with her weight. She has been unsuccessful in either losing weight, maintaining weight loss, or reaching her healthy weight goal.  She successfully lost ~100 lb 10 years ago using [place] (saw me at [facility] in 2015) meal replacement.  She would like to get back down to 145 lb where she felt better.  She has a hx of OSA on CPAP, hepatic steatosis and R breast cancer in 2020.  She lives w/ her husband and works a sedentary job.  Her PCP recently restarted her on Zepbound  2.5 mg weekly.  Hx has a hx of severe GI intolerance to Zebpound at 0.5 mg dose.  So far, she is doing well on this lowest dose.  She works out with a psychologist, educational (weight training) 4 days/ wk.  Debbie [name]'s habits were reviewed today and are  as follows: she has significant food cravings issues, she skips meals frequently, she frequently makes poor food choices, and she struggles with emotional eating. She averages 4,000 steps/ day.  She has found it harder to lose weight while on Tamoxifen  post breast cancer diagnosis.  Other Fatigue Debbie [name] denies daytime somnolence and denies waking up still tired. Patient has a history of symptoms of morning fatigue. Debbie [name] generally gets 6 hours of sleep per night, and states that she has generally restful sleep. Snoring is present. Apneic episodes are not present. Epworth Sleepiness Score is 3. Wearing CPAP nasal pillows (requests new supplies, last PSG done >10 year ago)  Shortness of Breath Margarite notes increasing shortness of breath with exercising and seems to be worsening over time with weight gain. She notes getting out of breath sooner with activity than she used to. This has gotten worse recently. Debbie [name] denies shortness of breath at rest or orthopnea.   Depression Screen Debbie [name]'s Food and Mood (modified PHQ-9) score was 8.     08/04/2024    8:41 AM  Depression screen PHQ 2/9  Decreased Interest 0  Down, Depressed, Hopeless 1  PHQ - 2 Score 1  Altered sleeping 0  Tired, decreased energy 1  Change in appetite 1  Feeling bad or failure about yourself  1  Trouble concentrating 0  Moving slowly or fidgety/restless 0  Suicidal thoughts 0  PHQ-9 Score 4  Difficult doing work/chores Not difficult at all     Assessment and Plan:   Other Fatigue Debbie [name] does feel that her weight is causing her energy to be lower than it should be. Fatigue may be related to obesity, depression or many other causes. Labs will be ordered, and in the meanwhile, Caidyn will focus on self care including making healthy food choices, increasing physical activity and focusing on stress reduction.  Shortness of Breath Debbie [name] does not know feel that she gets out of breath more easily that she used to when she  exercises. Sharmain's shortness of breath appears to be obesity related and exercise induced. She has agreed to work on weight loss and gradually increase exercise to treat her exercise induced shortness of breath. Will continue to monitor closely.   Problem List Items Addressed This Visit     OSA on CPAP Continue CPAP nightly with likely need to repeat PSG in 2026  Continue Zepbound  at 2.5 mg weekly initiated recently by PCP Aim for improving sleep time to 8 hrs at night    Osteopenia Not taking vitamin D  or calcium  consistently Recommend DEXA q 2 years Check vitamin D  levels today and recommend a chewable calcium  supplement daily.   Relevant Orders   VITAMIN D  25 Hydroxy (Vit-D Deficiency, Fractures)   Overweight (BMI 25.0-29.9) Given her co- morbidities, body fat % goal will be 30-35%   Other Visit Diagnoses       SOBOE (shortness of breath on exertion)    -  Primary     Other fatigue       Relevant Orders   Insulin , random   Hemoglobin A1c     Depression screen    Reviewed results with patient       Muscle cramps  Improve hydration with water to > 64 oz/ day Check mag level today       Relevant Orders   Magnesium     History of breast cancer     Continue routine follow up with Dr Loretha Work on increasing walking time and fiber intake throughout the day      Primary hypertension  Well controlled on   Losartan  100 mg daily        Debbie [name] is currently in the action stage of change and her goal is to get back to weightloss efforts . I recommend Debbie [name] begin the structured treatment plan as follows:  She has agreed to Category 3 Plan  Exercise goals: For substantial health benefits, adults should do at least 150 minutes (2 hours and 30 minutes) a week of moderate-intensity, or 75 minutes (1 hour and 15 minutes) a week of vigorous-intensity aerobic physical activity, or an equivalent combination of moderate- and vigorous-intensity aerobic activity. Aerobic activity should  be performed in episodes of at least 10 minutes, and preferably, it should be spread throughout the week. - great job with HIIT training/ weight training at gym 4 days/ w - increase daily steps up to 10,000 daily over the next 2 mos  Behavioral modification strategies:increasing lean protein intake, increase H2O intake, decrease liquid calories, increase high fiber foods, decreasing eating out, no skipping meals, keeping healthy foods in the home, better snacking choices, celebration eating strategies, avoiding temptations, and decrease junk food   She was informed of the importance of frequent follow-up visits to maximize her success with intensive lifestyle modifications for her multiple health conditions. She was informed we would discuss her lab results at her next visit unless there is  a critical issue that needs to be addressed sooner. Debbie [name] agreed to keep her next visit at the agreed upon time to discuss these results.  Objective:  General: Cooperative, alert, well developed, in no acute distress. HEENT: Conjunctivae and lids unremarkable. Cardiovascular: Regular rhythm.  Lungs: Normal work of breathing. Neurologic: No focal deficits.   Lab Results  Component Value Date   CREATININE 0.64 07/02/2024   BUN 14 07/02/2024   NA 144 07/02/2024   K 3.9 07/02/2024   CL 108 (H) 07/02/2024   CO2 25 07/02/2024   Lab Results  Component Value Date   ALT 21 06/23/2024   AST 25 06/23/2024   ALKPHOS 89 06/23/2024   BILITOT 0.3 06/23/2024   Lab Results  Component Value Date   HGBA1C 5.2 01/22/2024   No results found for: INSULIN  Lab Results  Component Value Date   TSH 0.963 06/23/2024   Lab Results  Component Value Date   CHOL 168 01/22/2024   HDL 88 01/22/2024   LDLCALC 67 01/22/2024   TRIG 70 01/22/2024   CHOLHDL 1.9 01/22/2024   Lab Results  Component Value Date   WBC 6.6 06/23/2024   HGB 13.6 06/23/2024   HCT 40.3 06/23/2024   MCV 90 06/23/2024   PLT 275 06/23/2024    Lab Results  Component Value Date   IRON 73 06/23/2024   TIBC 255 06/23/2024   FERRITIN 78 06/23/2024    Attestation Statements:  Reviewed by clinician on day of visit: allergies, medications, problem list, medical history, surgical history, family history, social history, and previous encounter notes.  Time spent on visit including pre-visit chart review and post-visit charting and face- to face care including nutritional counseling, review of EKG, interpretation of body composition scale and indirect calorimetry and nutrition prescription  was 42 minutes.   Darice Haddock, D.O. DABFM, DABOM [facility] [address] [place], [place] [address] [phone]

## 2024-08-05 ENCOUNTER — Encounter: Payer: Self-pay | Admitting: Hematology and Oncology

## 2024-08-05 LAB — HEMOGLOBIN A1C
Est. average glucose Bld gHb Est-mCnc: 103 mg/dL
Hgb A1c MFr Bld: 5.2 % (ref 4.8–5.6)

## 2024-08-05 LAB — INSULIN, RANDOM: INSULIN: 8.6 u[IU]/mL (ref 2.6–24.9)

## 2024-08-05 LAB — VITAMIN D 25 HYDROXY (VIT D DEFICIENCY, FRACTURES): Vit D, 25-Hydroxy: 22.3 ng/mL — ABNORMAL LOW (ref 30.0–100.0)

## 2024-08-05 LAB — MAGNESIUM: Magnesium: 2 mg/dL (ref 1.6–2.3)

## 2024-08-16 ENCOUNTER — Ambulatory Visit: Payer: Self-pay | Admitting: Family Medicine

## 2024-08-18 ENCOUNTER — Ambulatory Visit: Admitting: Family Medicine

## 2024-08-18 ENCOUNTER — Encounter: Payer: Self-pay | Admitting: Family Medicine

## 2024-08-18 VITALS — BP 132/81 | HR 73 | Temp 98.6°F | Ht 64.5 in | Wt 174.0 lb

## 2024-08-18 DIAGNOSIS — Z8639 Personal history of other endocrine, nutritional and metabolic disease: Secondary | ICD-10-CM | POA: Diagnosis not present

## 2024-08-18 DIAGNOSIS — G4733 Obstructive sleep apnea (adult) (pediatric): Secondary | ICD-10-CM | POA: Diagnosis not present

## 2024-08-18 DIAGNOSIS — I10 Essential (primary) hypertension: Secondary | ICD-10-CM | POA: Diagnosis not present

## 2024-08-18 DIAGNOSIS — Z6829 Body mass index (BMI) 29.0-29.9, adult: Secondary | ICD-10-CM

## 2024-08-18 DIAGNOSIS — E663 Overweight: Secondary | ICD-10-CM

## 2024-08-18 DIAGNOSIS — E559 Vitamin D deficiency, unspecified: Secondary | ICD-10-CM | POA: Diagnosis not present

## 2024-08-18 MED ORDER — ZEPBOUND 2.5 MG/0.5ML ~~LOC~~ SOAJ: 2.5000 mg | SUBCUTANEOUS | Status: DC

## 2024-08-18 MED ORDER — VITAMIN D (ERGOCALCIFEROL) 1.25 MG (50000 UNIT) PO CAPS: 50000.0000 [IU] | ORAL_CAPSULE | ORAL | 0 refills | Status: AC

## 2024-08-18 NOTE — Progress Notes (Signed)
 "  Office: [phone]  /  Fax: [phone]  WEIGHT SUMMARY AND BIOMETRICS  Starting Date: 08/04/24  Starting Weight: 174lb   Weight Lost Since Last Visit: 0lb   Vitals Temp: 98.6 F (37 C) BP: 132/81 Pulse Rate: 73 SpO2: 95 %   Body Composition  Body Fat %: 40.4 % Fat Mass (lbs): 70.4 lbs Muscle Mass (lbs): 98.8 lbs Total Body Water (lbs): 69.4 lbs Visceral Fat Rating : 11    HPI  Chief Complaint: OBESITY  Debbie [name] is here to discuss her progress with her obesity treatment plan. She is on the the Category 3 Plan and states she is following her eating plan approximately 25 % of the time. She states she is exercising 0 minutes 0 times per week.  Interval History:  Since last office visit she is down 0 lb She is down 0.6 lb of body fat since last visit She has recently cleaned out her pantry from junk food Her husband plans to be supportive with healthy eating She has been trying out foods on meal plan Hunger and cravings are better on Zepbound  2.5 mg weekly without GI upset She is working out 3 x a week She has been working on improving water intake and increasing daily steps  Pharmacotherapy: Zepbound  2.5 mg weekly  PHYSICAL EXAM:  Blood pressure 132/81, pulse 73, temperature 98.6 F (37 C), height 5' 4.5 (1.638 m), weight 174 lb (78.9 kg), SpO2 95%. Body mass index is 29.41 kg/m.  General: She is healthy appearing, cooperative, alert, well developed, and in no acute distress. PSYCH: Has normal mood, affect and thought process.   Lungs: Normal breathing effort, no conversational dyspnea.  ASSESSMENT AND PLAN  TREATMENT PLAN FOR OBESITY:  Recommended Dietary Goals  Judeen is currently in the action stage of change. As such, her goal is to continue weight management plan. She has agreed to keeping a food journal and adhering to recommended goals of 1600 calories and 70-80 g of  protein. - change from set meal plan - read ingredient labels, cutting out  ultra processed foods  Behavioral Intervention  We discussed the following Behavioral Modification Strategies today: increasing lean protein intake to established goals, increasing fiber rich foods, increasing water intake , work on meal planning and preparation, work on tracking and journaling calories using tracking application, keeping healthy foods at home, identifying sources and decreasing liquid calories, continue to practice mindfulness when eating, and planning for success.  Additional resources provided today: NA  Recommended Physical Activity Goals  Judith has been advised to work up to 150 minutes of moderate intensity aerobic activity a week and strengthening exercises 2-3 times per week for cardiovascular health, weight loss maintenance and preservation of muscle mass.   She has agreed to Increase the intensity, frequency or duration of strengthening exercises  and Increase and monitor steps for a goal of 10,000 per day Step goal set at > 7,000 for the next month Keep weight training 3 days/ wk  Pharmacotherapy changes for the treatment of obesity: none  ASSOCIATED CONDITIONS ADDRESSED TODAY  Vitamin D  deficiency New Not on a vitamin D  supplement but notes a hx of osteopenia We discussed the importance of treating vitamin D  deficiency Begin: -     Vitamin D  (Ergocalciferol ); Take 1 capsule (50,000 Units total) by mouth every 7 (seven) days.  Dispense: 5 capsule; Refill: 0 Repeat lab in 3-4 mos  OSA (obstructive sleep apnea) She wears CPAP nightly but wonders if she still needs it.  She has lost over 100 lb in the past 10 years and has not been retested.  Doing well on Zepbound  but has hx of severe GI upset on doses over 2.5 mg weekly.  Ref to lung and sleep wellness made  -     Pulmonary Visit -     Zepbound ; Inject 2.5 mg into the skin once a week.  Overweight (BMI 25.0-29.9) Improving Reviewed plan above  BMI 29.0-29.9,adult  History of  obesity Improving  Primary hypertension BP at goal on losartan  100 mg daily     She was informed of the importance of frequent follow up visits to maximize her success with intensive lifestyle modifications for her multiple health conditions.   ATTESTASTION STATEMENTS:  Reviewed by clinician on day of visit: allergies, medications, problem list, medical history, surgical history, family history, social history, and previous encounter notes pertinent to obesity diagnosis.   I have personally spent 30 minutes total time today in preparation, patient care, nutritional counseling and education,  and documentation for this visit, including the following: review of most recent clinical lab tests, prescribing medications/ refilling medications, reviewing medical assistant documentation, review and interpretation of bioimpedence results.     Darice Haddock, D.O. DABFM, DABOM [facility] [address] [place], [place] [address] [phone] "

## 2024-08-18 NOTE — Patient Instructions (Addendum)
 Try to track your calories using the LoseIt app Aim for 1600 cal/ day This should include 70-80 g of protein daily   You can try out plain Fage Lactose free yogurt/ Lactaid cottage cheese Fairlife low fat or whole milk is also a good choice  Begin RX vitamin D  1 x a week  Keep steps > 7,000 per day  Keep weight training 2-3 x a week

## 2024-08-24 ENCOUNTER — Inpatient Hospital Stay: Attending: Adult Health | Admitting: Adult Health

## 2024-08-24 ENCOUNTER — Encounter: Payer: Self-pay | Admitting: Adult Health

## 2024-08-24 ENCOUNTER — Encounter: Payer: Self-pay | Admitting: Oncology

## 2024-08-24 VITALS — BP 141/70 | HR 76 | Temp 98.8°F | Resp 18 | Wt 180.2 lb

## 2024-08-24 DIAGNOSIS — Z9221 Personal history of antineoplastic chemotherapy: Secondary | ICD-10-CM | POA: Insufficient documentation

## 2024-08-24 DIAGNOSIS — E042 Nontoxic multinodular goiter: Secondary | ICD-10-CM | POA: Insufficient documentation

## 2024-08-24 DIAGNOSIS — Z170 Estrogen receptor positive status [ER+]: Secondary | ICD-10-CM | POA: Insufficient documentation

## 2024-08-24 DIAGNOSIS — Z1722 Progesterone receptor negative status: Secondary | ICD-10-CM | POA: Diagnosis not present

## 2024-08-24 DIAGNOSIS — Z923 Personal history of irradiation: Secondary | ICD-10-CM | POA: Diagnosis not present

## 2024-08-24 DIAGNOSIS — C50211 Malignant neoplasm of upper-inner quadrant of right female breast: Secondary | ICD-10-CM | POA: Insufficient documentation

## 2024-08-24 DIAGNOSIS — Z1732 Human epidermal growth factor receptor 2 negative status: Secondary | ICD-10-CM | POA: Diagnosis not present

## 2024-08-24 DIAGNOSIS — Z79810 Long term (current) use of selective estrogen receptor modulators (SERMs): Secondary | ICD-10-CM | POA: Insufficient documentation

## 2024-08-24 NOTE — Progress Notes (Signed)
 [facility] Cancer Follow up:    Antoniette Vermell CROME, PA-C [address] [place] [address]   DIAGNOSIS: Cancer Staging  Malignant neoplasm of upper-inner quadrant of right breast in female, estrogen receptor positive (HCC) Staging form: Breast, AJCC 8th Edition - Clinical stage from 11/04/2018: Stage IIA (cT2, cN1, cM0, G2, ER+, PR-, HER2+) - Unsigned Histologic grading system: 3 grade system Laterality: Right Stage used in treatment planning: Yes National guidelines used in treatment planning: Yes Type of national guideline used in treatment planning: NCCN    SUMMARY OF ONCOLOGIC HISTORY:  [place] woman status post right breast upper inner quadrant biopsy 10/28/2018 for a clinical T2 N1, stage IIA invasive ductal carcinoma, grade 2, estrogen receptor positive, progesterone receptor negative, HER-2 amplified, with an MIB-1 of 20%             (a) staging work-up with CT scans of the chest abdomen and pelvis and thyroid  ultrasonography shows multiple findings that will require follow-up:                         (i) two 0.5 cm liver lesions                         (ii) possible bladder mass, thickened sigmoid and rectum, and prominent adnexa                         (iii) thyroid  nodule biopsied 11/19/2018, read as [place] 3 (risk of malignancy 5-15%)                         (iiii)  thyroid  lymph node biopsied 11/19/2018 showing no evidence of carcinoma                         (iiiii) abd MRI 05/07 2020 confirms two tiny liver lesions. No bladder or adnexal malignancy   (1) neoadjuvant chemotherapy consisting of carboplatin , docetaxel , trastuzumab , and pertuzumab  every 21 days x 6 started 11/17/2018, copleted 03/02/2019             (a) pertuzumab  discontinued after cycle 1 due to severe diarrhea   (2) trastuzumab  continued to complete 1 year (last dose 10/19/2019).).             (a) baseline echocardiogram 11/11/2018 showed an ejection fraction in  the 55-60% range             (b) echocardiogram 03/10/2019 shows EF of 60-65%             (c) echo 06/08/2019 shows EF 60%             (d) echo 09/14/2018 shows an ejection fraction in the 60-65% range.             (e) echo 12/14/2019 shows an ejection fraction in the 60-65% range   (3) Right breast lumpectomy on 04/15/2019: no residual carcinoma (ypT0 ypN0)             (a) a total of 4 lymph nodes were removed   (4) adjuvant radiation 05/31/2019 - 07/14/2019 Site Technique Total Dose (Gy) Dose per Fx (Gy) Completed Fx Beam Energies  Breast: Breast_Rt 3D 50.4/50.4 1.8 28/28 6X, 10X  Breast: Breast_Rt_axilla 3D 45/45 1.8 25/25 6X, 10X    (5) started tamoxifen  08/20/2019             (  a) s/p hysterectomy             (b) bone density 07/30/2019 shows a T score of -1.8              (c) used oral contraceptives for 7 or 8 years in the past with no complications   (6) thyroid  nodules followed by Dr Krystal Spinner   (7) very small liver abnormalities noted on CT March 2020 and MRI May 2020 require follow-up              (a) repeat liver MRI 06/06/2020 shows these to be small harmartomas or cysts  CURRENT THERAPY: tamoxifen   INTERVAL HISTORY:  Discussed the use of AI scribe software for clinical note transcription with the patient, who gave verbal consent to proceed.  History of Present Illness Debbie [name] is a 64 year old female with stage IIA, ER-positive, HER2-positive right breast invasive ductal carcinoma who presents for oncology follow-up and evaluation of her history of breast cancer.  She was diagnosed in March 2020 and has completed neoadjuvant chemotherapy, lumpectomy, maintenance trastuzumab  in March 2021, and adjuvant radiation. She is currently on tamoxifen  with planned discontinuation in January 2026. Her most recent mammogram was in April 2025, with the next planned for April 2026.  She has a new irregularly shaped tender area on the right breast, present for about one  month since early December 2025. It is tender to palpation without erythema, bleeding, swelling, or pain, and there are no changes in the left breast. She had breast reduction two years ago and is concerned about the cause of this new lesion.  Bone density testing in May 2024 showed hip osteopenia. She remains physically active with kickboxing, strength training, and high-intensity interval training. She is on high-dose vitamin D  for deficiency (recent level 22) and is considering calcium  supplementation and a multivitamin. She denies falls or balance problems.     Patient Active Problem List   Diagnosis Date Noted   Coronary artery disease due to lipid rich plaque 02/02/2024   Chronic right-sided low back pain without sciatica 02/02/2024   Dysuria 02/02/2024   History of obesity 12/02/2023   Overweight (BMI 25.0-29.9) 12/01/2023   Change in bowel habits 10/08/2023   Hypomagnesemia 05/14/2023   Colitis presumed infectious 04/29/2023   Diarrhea 04/29/2023   GIB (gastrointestinal bleeding) 04/29/2023   History of breast cancer in adulthood 04/29/2023   Hypokalemia 04/29/2023   Hammertoe of right foot 03/18/2023   Right foot pain 03/18/2023   Osteopenia 12/20/2022   Coronary artery calcification 07/10/2022   Palpitations 07/10/2022   Anxiety 07/09/2022   Asthma 07/09/2022   Breast cancer (HCC) 07/09/2022   Bronchitis 07/09/2022   Personal history of radiation therapy 07/09/2022   PONV (postoperative nausea and vomiting) 07/09/2022   Seasonal allergies 07/09/2022   GERD (gastroesophageal reflux disease) 07/09/2022   Irritable bowel syndrome (IBS) 07/09/2022   RBBB 05/13/2022   Stress 08/28/2020   Tachycardia 08/28/2020   Port-A-Cath in place 12/29/2018   Liver lesion 11/13/2018   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   Sigmoid thickening 11/13/2018   Bladder mass 11/13/2018   Thyroid  nodule 11/13/2018   Enlarged lymph nodes 11/13/2018   Liver nodule  11/11/2018   Abnormal CT of the abdomen 11/11/2018   Malignant neoplasm of upper-inner quadrant of right breast in female, estrogen receptor positive (HCC) 11/02/2018   Multiple food allergies 07/20/2018   Hypotension 04/29/2018   Primary osteoarthritis of both hands 04/29/2018   Primary  osteoarthritis of both feet 04/29/2018   Positive ANA (antinuclear antibody) 02/06/2018   Bilateral hand pain 01/23/2018   Hand joint stiff, unspecified laterality 01/23/2018   Periodic limb movement 11/23/2017   OSA on CPAP 11/23/2017   Nocturnal hypoxemia 11/23/2017   Reactive airway disease, mild intermittent, uncomplicated 10/21/2017   Snoring 10/20/2017   Rectocele 10/20/2017   Non-restorative sleep 10/20/2017   No energy 10/20/2017   Chest tightness 07/09/2017   SOB (shortness of breath) 07/09/2017   Cough 07/09/2017   Fungal infection of toenail 01/17/2017   Large breasts 06/26/2016   Primary insomnia 06/26/2016   Mid back pain 06/26/2016   Chronic shoulder pain 06/26/2016   Labral tear of shoulder 08/29/2014   Elevated LFTs 05/11/2013   Class 1 obesity due to excess calories with serious comorbidity and body mass index (BMI) of 30.0 to 30.9 in adult 03/05/2013   Gastritis 02/17/2013   History of hysterectomy 01/04/2013   History of shingles 01/04/2013   Essential hypertension, benign 12/01/2012   Family history of colon cancer 12/01/2012   Personal history of chemotherapy 12/01/2012    is allergic to erythromycin, penicillins, azithromycin, erythromycin base, levofloxacin, semaglutide , and penicillin g.  MEDICAL HISTORY: Past Medical History:  Diagnosis Date   Abnormal CT of the abdomen 11/11/2018   Allergy  12/1995   spring seasonal   Anxiety 12/01/2012   Asthma    with severe URIs   Bilateral hand pain 01/23/2018   Bladder mass 11/13/2018   Breast cancer (HCC) 10/30/2018   right breast and lymph   Bronchitis    Change in bowel habits 10/08/2023   Chest tightness 07/09/2017    Chronic shoulder pain 06/26/2016   Class 1 obesity due to excess calories with serious comorbidity and body mass index (BMI) of 30.0 to 30.9 in adult 03/05/2013   Dr. Waylan: Qsymia started July 2014, sleep study ordered.   Class 1 obesity due to excess calories without serious comorbidity with body mass index (BMI) of 30.0 to 30.9 in adult 02/27/2023   Colitis presumed infectious 04/29/2023   Coronary artery calcification 07/10/2022   Cough 07/09/2017   Diarrhea 04/29/2023   Elevated LFTs 05/11/2013   Liver biopsy normal spring 2015   Enlarged lymph nodes 11/13/2018   Essential hypertension, benign 12/01/2012   Family history of colon cancer    Family history of stroke 04/29/2018   Fungal infection of toenail 01/17/2017   Gastritis 02/17/2013   GERD (gastroesophageal reflux disease)    GIB (gastrointestinal bleeding) 04/29/2023   Hammertoe of right foot 03/18/2023   Hand joint stiff, unspecified laterality 01/23/2018   History of breast cancer in adulthood 04/29/2023   History of hysterectomy 01/04/2013   History of obesity 12/02/2023   History of shingles 01/04/2013   Age 23   Hypokalemia 04/29/2023   Hypomagnesemia 05/14/2023   Hypotension 04/29/2018   Irritable bowel syndrome (IBS)    had for years, per patient   Labral tear of shoulder 08/29/2014   Large breasts 06/26/2016   Liver lesion 11/13/2018   Liver nodule 11/11/2018   Malignant neoplasm of upper-inner quadrant of right breast in female, 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 IgG labs 2019.   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   No energy 10/20/2017   Nocturnal hypoxemia 11/23/2017   Non-restorative sleep 10/20/2017   OSA (obstructive sleep apnea) 11/23/2017   Osteopenia 12/20/2022   Overweight (BMI 25.0-29.9)  12/01/2023   Palpitations 07/10/2022   Periodic limb movement 11/23/2017    Personal history of chemotherapy    Personal history of radiation therapy    PONV (postoperative nausea and vomiting)    Port-A-Cath in place 12/29/2018   Positive ANA (antinuclear antibody) 02/06/2018   Primary insomnia 06/26/2016   Primary osteoarthritis of both feet 04/29/2018   Primary osteoarthritis of both hands 04/29/2018   RBBB 05/13/2022   Reactive airway disease, mild intermittent, uncomplicated 10/21/2017   Rectocele 10/20/2017   Right foot pain 03/18/2023   Seasonal allergies    Sigmoid thickening 11/13/2018   Snoring 10/20/2017   SOB (shortness of breath) 07/09/2017   Stress 08/28/2020   Tachycardia 08/28/2020   Thyroid  nodule 11/13/2018    SURGICAL HISTORY: Past Surgical History:  Procedure Laterality Date   ANKLE SURGERY     BREAST CYST EXCISION Bilateral    BREAST EXCISIONAL BIOPSY Bilateral    BREAST LUMPECTOMY Right 03/2019   BREAST LUMPECTOMY Left    benign. done around age 70   BREAST LUMPECTOMY WITH RADIOACTIVE SEED AND AXILLARY LYMPH NODE DISSECTION Right 04/15/2019   Procedure: RIGHT BREAST LUMPECTOMY WITH RADIOACTIVE SEED AND  TARGETED RIGHT AXILLARY LYMPH NODE DISSECTION WITH RADIOACTIVE SEED;  Surgeon: Ethyl Lenis, MD;  Location: [facility];  Service: General;  Laterality: Right;   CHOLECYSTECTOMY  2012   EYE SURGERY  01/2000   lid surgery   FRACTURE SURGERY  01/2003   right ankle   KNEE ARTHROSCOPY W/ MEDIAL COLLATERAL LIGAMENT (MCL) REPAIR Left 10/07/2012   PORTACATH PLACEMENT N/A 11/16/2018   Procedure: INSERTION PORT-A-CATH WITH ULTRASOUND;  Surgeon: Ethyl Lenis, MD;  Location: [facility];  Service: General;  Laterality: N/A;   REDUCTION MAMMAPLASTY Bilateral 11/17/2020   TONSILLECTOMY  1969   TOTAL ABDOMINAL HYSTERECTOMY  2011   with vaginal sling    SOCIAL HISTORY: Social History   Socioeconomic History   Marital status: Married    Spouse name: Darina   Number of children: 2   Years of education: Not on file   Highest  education level: Bachelor's degree (e.g., BA, AB, BS)  Occupational History   Not on file  Tobacco Use   Smoking status: Never   Smokeless tobacco: Never  Vaping Use   Vaping status: Never Used  Substance and Sexual Activity   Alcohol use: Yes    Comment: rare   Drug use: No   Sexual activity: Not Currently  Other Topics Concern   Not on file  Social History Narrative   Not on file   Social Drivers of Health   Tobacco Use: Low Risk (08/24/2024)   Patient History    Smoking Tobacco Use: Never    Smokeless Tobacco Use: Never    Passive Exposure: Not on file  Financial Resource Strain: Low Risk (08/24/2024)   Overall Financial Resource Strain (CARDIA)    Difficulty of Paying Living Expenses: Not hard at all  Food Insecurity: No Food Insecurity (08/24/2024)   Epic    Worried About Radiation Protection Practitioner of Food in the Last Year: Never true    Ran Out of Food in the Last Year: Never true  Transportation Needs: No Transportation Needs (08/24/2024)   Epic    Lack of Transportation (Medical): No    Lack of Transportation (Non-Medical): No  Physical Activity: Sufficiently Active (08/24/2024)   Exercise Vital Sign    Days of Exercise per Week: 4 days    Minutes of Exercise per Session: 50 min  Recent  Concern: Physical Activity - Insufficiently Active (08/24/2024)   Exercise Vital Sign    Days of Exercise per Week: 2 days    Minutes of Exercise per Session: 40 min  Stress: Stress Concern Present (08/24/2024)   [employer] of Occupational Health - Occupational Stress Questionnaire    Feeling of Stress: To some extent  Social Connections: Moderately Isolated (08/24/2024)   Social Connection and Isolation Panel    Frequency of Communication with Friends and Family: More than three times a week    Frequency of Social Gatherings with Friends and Family: Three times a week    Attends Religious Services: Never    Active Member of Clubs or Organizations: No    Attends Banker Meetings:  Never    Marital Status: Married  Catering Manager Violence: Not At Risk (08/24/2024)   Epic    Fear of Current or Ex-Partner: No    Emotionally Abused: No    Physically Abused: No    Sexually Abused: No  Depression (PHQ2-9): Low Risk (08/24/2024)   Depression (PHQ2-9)    PHQ-2 Score: 0  Alcohol Screen: Low Risk (08/24/2024)   Alcohol Screen    Last Alcohol Screening Score (AUDIT): 1  Housing: Unknown (08/24/2024)   Epic    Unable to Pay for Housing in the Last Year: No    Number of Times Moved in the Last Year: Not on file    Homeless in the Last Year: No  Utilities: Not At Risk (08/24/2024)   Epic    Threatened with loss of utilities: No  Health Literacy: Adequate Health Literacy (08/24/2024)   B1300 Health Literacy    Frequency of need for help with medical instructions: Never    FAMILY HISTORY: Family History  Problem Relation Age of Onset   Hypertension Mother    Rheum arthritis Mother    Heart failure Mother    Heart disease Mother    Hypertension Father    Alcoholism Father    Colon cancer Father 93   Lung cancer Father    Depression Father    Diabetes Father    Skin cancer Father    Asthma Father    Hypertension Brother    Stroke Brother    Atrial fibrillation Brother    Heart disease Maternal Grandmother    Hypertension Maternal Grandmother    Hyperlipidemia Maternal Grandfather    Heart attack Maternal Grandfather        died at 31   Heart disease Maternal Grandfather    Stroke Paternal Grandmother    Skin cancer Paternal Grandmother    Arthritis Paternal Grandmother    Hypertension Paternal Actor    Healthy Daughter    Healthy Daughter    Breast cancer Neg Hx    Ovarian cancer Neg Hx    Prostate cancer Neg Hx    Pancreatic cancer Neg Hx     Review of Systems  Constitutional:  Negative for appetite change, chills, fatigue, fever and unexpected weight change.  HENT:   Negative for hearing loss, lump/mass and trouble swallowing.   Eyes:  Negative  for eye problems and icterus.  Respiratory:  Negative for chest tightness, cough and shortness of breath.   Cardiovascular:  Negative for chest pain, leg swelling and palpitations.  Gastrointestinal:  Negative for abdominal distention, abdominal pain, constipation, diarrhea, nausea and vomiting.  Endocrine: Negative for hot flashes.  Genitourinary:  Negative for difficulty urinating.   Musculoskeletal:  Negative for arthralgias.  Skin:  Negative for itching  and rash.  Neurological:  Negative for dizziness, extremity weakness, headaches and numbness.  Hematological:  Negative for adenopathy. Does not bruise/bleed easily.  Psychiatric/Behavioral:  Negative for depression. The patient is not nervous/anxious.       PHYSICAL EXAMINATION   Onc Performance Status - 08/24/24 1402       ECOG Perf Status   ECOG Perf Status Restricted in physically strenuous activity but ambulatory and able to carry out work of a light or sedentary nature, e.g., light house work, office work      KPS SCALE   KPS % SCORE Able to carry on normal activity, minor s/s of disease          Vitals:   08/24/24 1353  BP: (!) 141/70  Pulse: 76  Resp: 18  Temp: 98.8 F (37.1 C)  SpO2: 95%    Physical Exam Constitutional:      General: She is not in acute distress.    Appearance: Normal appearance. She is not toxic-appearing.  HENT:     Head: Normocephalic and atraumatic.     Mouth/Throat:     Mouth: Mucous membranes are moist.     Pharynx: Oropharynx is clear. No oropharyngeal exudate or posterior oropharyngeal erythema.  Eyes:     General: No scleral icterus. Cardiovascular:     Rate and Rhythm: Normal rate and regular rhythm.     Pulses: Normal pulses.     Heart sounds: Normal heart sounds.  Pulmonary:     Effort: Pulmonary effort is normal.     Breath sounds: Normal breath sounds.  Chest:     Comments: Right breast s/p lumpectomy and radiation, nodularity noted between 3 and 4 o'clock towards  chest wall; left breast benign Abdominal:     General: Abdomen is flat. Bowel sounds are normal. There is no distension.     Palpations: Abdomen is soft.     Tenderness: There is no abdominal tenderness.  Musculoskeletal:        General: No swelling.     Cervical back: Neck supple.  Lymphadenopathy:     Cervical: No cervical adenopathy.     Upper Body:     Right upper body: No supraclavicular or axillary adenopathy.     Left upper body: No supraclavicular or axillary adenopathy.  Skin:    General: Skin is warm and dry.     Findings: No rash.  Neurological:     General: No focal deficit present.     Mental Status: She is alert.  Psychiatric:        Mood and Affect: Mood normal.        Behavior: Behavior normal.     ASSESSMENT and THERAPY PLAN:   Assessment and Plan Assessment & Plan Stage IIa ER-positive, HER2-positive invasive ductal carcinoma of the right breast Status post neoadjuvant chemotherapy, lumpectomy, maintenance trastuzumab , adjuvant radiation, and tamoxifen . New tender area in the right breast may represent fat necrosis or scar tissue, but recurrence or other pathology must be excluded. Most recent mammogram was unremarkable. - Instructed her to discontinue tamoxifen  after completing the current supply, as planned. - Ordered diagnostic mammogram and ultrasound of the right breast to evaluate the new tender area and exclude recurrence or other pathology. - Contacted the breast imaging center to facilitate scheduling and confirm receipt of orders. - Scheduled follow-up in April 2026.  Osteopenia of the hips Osteopenia on bone density testing with T-scores in the mid-range. Regular physical activity maintained, but dietary calcium  intake is limited. -  Recommended calcium  supplementation due to low dietary intake. - Advised continuation of vitamin D  supplementation to support calcium  absorption. - Encouraged ongoing weight-bearing exercise and walking. - Provided  education on fracture risk and fall prevention.  Vitamin D  deficiency Vitamin D  deficiency with recent level of 22 ng/mL, goal >56. - Continue high-dose vitamin D  supplementation for one month. - Transition to daily multivitamin after completion of high-dose regimen.  RTC in 11/2024 as scheduled with Dr. Loretha, or sooner if needed.   All questions were answered. The patient knows to call the clinic with any problems, questions or concerns. We can certainly see the patient much sooner if necessary.  Total encounter time:30 minutes*in face-to-face visit time, chart review, lab review, care coordination, order entry, and documentation of the encounter time.    Morna Kendall, NP 08/24/2024 2:08 PM Medical Oncology and Hematology [facility] [address] [place], [place] [address] Tel. [phone]    Fax. [phone]  *Total Encounter Time as defined by the Centers for Medicare and Medicaid Services includes, in addition to the face-to-face time of a patient visit (documented in the note above) non-face-to-face time: obtaining and reviewing outside history, ordering and reviewing medications, tests or procedures, care coordination (communications with other health care professionals or caregivers) and documentation in the medical record.

## 2024-08-25 ENCOUNTER — Telehealth: Payer: Self-pay | Admitting: *Deleted

## 2024-08-25 NOTE — Telephone Encounter (Signed)
 [facility]

## 2024-08-26 ENCOUNTER — Encounter: Payer: Self-pay | Admitting: Oncology

## 2024-08-31 ENCOUNTER — Inpatient Hospital Stay: Admission: RE | Admit: 2024-08-31 | Discharge: 2024-08-31 | Attending: Adult Health | Admitting: Adult Health

## 2024-08-31 ENCOUNTER — Ambulatory Visit
Admission: RE | Admit: 2024-08-31 | Discharge: 2024-08-31 | Disposition: A | Source: Ambulatory Visit | Attending: Adult Health | Admitting: Adult Health

## 2024-08-31 ENCOUNTER — Encounter: Payer: Self-pay | Admitting: Oncology

## 2024-08-31 DIAGNOSIS — Z170 Estrogen receptor positive status [ER+]: Secondary | ICD-10-CM

## 2024-09-06 ENCOUNTER — Other Ambulatory Visit: Payer: Self-pay | Admitting: Family Medicine

## 2024-09-06 DIAGNOSIS — R112 Nausea with vomiting, unspecified: Secondary | ICD-10-CM

## 2024-09-06 MED ORDER — ONDANSETRON 8 MG PO TBDP: 8.0000 mg | ORAL_TABLET | Freq: Three times a day (TID) | ORAL | 0 refills | Status: AC | PRN

## 2024-09-08 ENCOUNTER — Telehealth: Payer: Self-pay

## 2024-09-08 ENCOUNTER — Other Ambulatory Visit: Payer: Self-pay | Admitting: Family Medicine

## 2024-09-08 NOTE — Transitions of Care (Post Inpatient/ED Visit) (Signed)
" ° °  09/08/2024  Name: Debbie [name] MRN: [id number] DOB: 03-24-1961  Today's TOC FU Call Status: Today's TOC FU Call Status:: Unsuccessful Call (1st Attempt) Unsuccessful Call (1st Attempt) Date: 09/08/24  Attempted to reach the patient regarding the most recent Inpatient/ED visit.  Follow Up Plan: Additional outreach attempts will be made to reach the patient to complete the Transitions of Care (Post Inpatient/ED visit) call.   Signature Julian Lemmings, LPN [facility] Nurse Health Advisor Direct Dial [phone]  "

## 2024-09-09 NOTE — Transitions of Care (Post Inpatient/ED Visit) (Signed)
" ° °  09/09/2024  Name: Debbie [name] MRN: [id number] DOB: 1960-10-20  Today's TOC FU Call Status: Today's TOC FU Call Status:: Unsuccessful Call (2nd Attempt) Unsuccessful Call (1st Attempt) Date: 09/08/24 Unsuccessful Call (2nd Attempt) Date: 09/09/24  Attempted to reach the patient regarding the most recent Inpatient/ED visit.  Follow Up Plan: Additional outreach attempts will be made to reach the patient to complete the Transitions of Care (Post Inpatient/ED visit) call.   Signature Julian Lemmings, LPN [facility] Nurse Health Advisor Direct Dial [phone]  "

## 2024-09-10 NOTE — Transitions of Care (Post Inpatient/ED Visit) (Signed)
" ° °  09/10/2024  Name: Debbie [name] MRN: [id number] DOB: 1961/02/08  Today's TOC FU Call Status: Today's TOC FU Call Status:: Unsuccessful Call (3rd Attempt) Unsuccessful Call (1st Attempt) Date: 09/08/24 Unsuccessful Call (2nd Attempt) Date: 09/09/24 Unsuccessful Call (3rd Attempt) Date: 09/10/24  Attempted to reach the patient regarding the most recent Inpatient/ED visit.  Follow Up Plan: No further outreach attempts will be made at this time. We have been unable to contact the patient.  Signature Julian Lemmings, LPN [facility] Nurse Health Advisor Direct Dial [phone]  "

## 2024-09-15 ENCOUNTER — Ambulatory Visit: Admitting: Family Medicine

## 2024-09-23 ENCOUNTER — Telehealth: Admitting: Family Medicine

## 2024-09-23 VITALS — Ht 64.5 in | Wt 177.0 lb

## 2024-09-23 DIAGNOSIS — E663 Overweight: Secondary | ICD-10-CM

## 2024-09-23 DIAGNOSIS — Z8719 Personal history of other diseases of the digestive system: Secondary | ICD-10-CM | POA: Diagnosis not present

## 2024-09-23 DIAGNOSIS — Z6829 Body mass index (BMI) 29.0-29.9, adult: Secondary | ICD-10-CM

## 2024-09-23 DIAGNOSIS — E739 Lactose intolerance, unspecified: Secondary | ICD-10-CM

## 2024-09-23 NOTE — Progress Notes (Signed)
 "  Office: [phone]  /  Fax: [phone]  WEIGHT SUMMARY AND BIOMETRICS  No data recorded No data recorded  No data recorded  No data recorded No data recorded I connected with  Debbie [name] on 09/23/24 by a video and audio enabled telemedicine application and verified that I am speaking with the correct person using two identifiers.  Patient Location: Home  Provider Location: Office/Clinic  Persons Participating in Visit: Patient.  I discussed the limitations of evaluation and management by telemedicine. The patient expressed understanding and agreed to proceed.   Vital Signs: Because this visit was a virtual/telehealth visit, some criteria may be missing or patient reported. Any vitals not documented were not able to be obtained and vitals that have been documented are patient reported.      HPI  Chief Complaint: OBESITY  Debbie [name] is here to discuss her progress with her obesity treatment plan. She is on the keeping a food journal and adhering to recommended goals of 1600 calories and 70-80 g of  protein and states she is following her eating plan approximately 60 % of the time. She states she is exercising 0 minutes 0 times per week.  Interval History:  Since last office visit she is up 3 lb Since was in the hospital 1/19 for acute pancreatitis that occurred after starting Zepbound  2.5 mg injection for weight management She was treated with IV fluids and was kept overnight She is feeling better Energy level almost back to normal Resuming exercise, starting slow this week Denies any further nausea or abdominal pain Bowel movements are back to baseline She is abstaining from high fat foods and lactose  Pharmacotherapy: none  PHYSICAL EXAM:  Height 5' 4.5 (1.638 m), weight 177 lb (80.3 kg). Body mass index is 29.91 kg/m.  General: She is healthy appearing, cooperative, alert, well developed, and in no acute distress. PSYCH: Has normal mood, affect and  thought process.   Lungs: Normal breathing effort, no conversational dyspnea.  ASSESSMENT AND PLAN  TREATMENT PLAN FOR OBESITY:  Recommended Dietary Goals  Debbie [name] is currently in the action stage of change. As such, her goal is to continue weight management plan. She has agreed to keeping a food journal and adhering to recommended goals of 1600 calories and 70 g of  protein.  Behavioral Intervention  We discussed the following Behavioral Modification Strategies today: increasing fiber rich foods, increasing water intake , work on meal planning and preparation, keeping healthy foods at home, continue to practice mindfulness when eating, and planning for success.  Additional resources provided today: NA  Recommended Physical Activity Goals  Debbie [name] has been advised to work up to 150 minutes of moderate intensity aerobic activity a week and strengthening exercises 2-3 times per week for cardiovascular health, weight loss maintenance and preservation of muscle mass.   She has agreed to Increase the intensity, frequency or duration of aerobic exercises    Pharmacotherapy changes for the treatment of obesity: none (stay OFF all GLP-1 Ras)  ASSOCIATED CONDITIONS ADDRESSED TODAY  History of pancreatitis Reviewed hospital notes from 09/06/2024 for drug-induced pancreatitis due to new start of Zepbound .  She has immediately discontinued.  She was treated with IV fluids and pain control.  She is no longer having issues with abdominal pain or nausea.  CT abdomen reviewed.  Lipase was extremely high at 2778 and will likely be rechecked by her PCP at her upcoming visitScheduled for 09/27/2024.  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  Continue  to focus on hydrating well standing from high fat foods.  Overweight (BMI 25.0-29.9) Stable Focus on fiber intake with meals, hydration Can resume calorie tracking with a goal of about 1600/day  BMI 29.0-29.9,adult  Lactose  intolerance Avoid lactose containing foods.  She has found some lactose-free dairy items.     She was informed of the importance of frequent follow up visits to maximize her success with intensive lifestyle modifications for her multiple health conditions.   ATTESTASTION STATEMENTS:  Reviewed by clinician on day of visit: allergies, medications, problem list, medical history, surgical history, family history, social history, and previous encounter notes pertinent to obesity diagnosis.      Debbie [name], D.O. DABFM, DABOM [facility] [address] [place], [place] [address] [phone]  "

## 2024-09-27 ENCOUNTER — Ambulatory Visit: Admitting: Physician Assistant

## 2024-10-25 ENCOUNTER — Ambulatory Visit: Admitting: Family Medicine

## 2024-12-01 ENCOUNTER — Ambulatory Visit: Admitting: Hematology and Oncology
